# Patient Record
Sex: Male | Born: 1955 | Race: White | Hispanic: No | Marital: Married | Smoking: Former smoker
Health system: Southern US, Community
[De-identification: ages and names within clinical notes are randomized; demographics above are authoritative.]

## PROBLEM LIST (undated history)

## (undated) DIAGNOSIS — K4090 Unilateral inguinal hernia, without obstruction or gangrene, not specified as recurrent: Secondary | ICD-10-CM

## (undated) DIAGNOSIS — K219 Gastro-esophageal reflux disease without esophagitis: Secondary | ICD-10-CM

## (undated) DIAGNOSIS — R351 Nocturia: Secondary | ICD-10-CM

## (undated) DIAGNOSIS — E663 Overweight: Secondary | ICD-10-CM

## (undated) DIAGNOSIS — I10 Essential (primary) hypertension: Secondary | ICD-10-CM

## (undated) DIAGNOSIS — N35919 Unspecified urethral stricture, male, unspecified site: Secondary | ICD-10-CM

## (undated) DIAGNOSIS — N4340 Spermatocele of epididymis, unspecified: Secondary | ICD-10-CM

## (undated) DIAGNOSIS — N411 Chronic prostatitis: Secondary | ICD-10-CM

## (undated) DIAGNOSIS — IMO0001 Reserved for inherently not codable concepts without codable children: Secondary | ICD-10-CM

## (undated) DIAGNOSIS — R42 Dizziness and giddiness: Secondary | ICD-10-CM

## (undated) DIAGNOSIS — J40 Bronchitis, not specified as acute or chronic: Secondary | ICD-10-CM

## (undated) DIAGNOSIS — Z87442 Personal history of urinary calculi: Secondary | ICD-10-CM

## (undated) DIAGNOSIS — N400 Enlarged prostate without lower urinary tract symptoms: Secondary | ICD-10-CM

## (undated) DIAGNOSIS — E119 Type 2 diabetes mellitus without complications: Secondary | ICD-10-CM

## (undated) DIAGNOSIS — Z9889 Other specified postprocedural states: Secondary | ICD-10-CM

## (undated) DIAGNOSIS — R310 Gross hematuria: Secondary | ICD-10-CM

## (undated) DIAGNOSIS — R109 Unspecified abdominal pain: Secondary | ICD-10-CM

## (undated) DIAGNOSIS — G4733 Obstructive sleep apnea (adult) (pediatric): Secondary | ICD-10-CM

## (undated) DIAGNOSIS — N200 Calculus of kidney: Secondary | ICD-10-CM

## (undated) DIAGNOSIS — T8859XA Other complications of anesthesia, initial encounter: Secondary | ICD-10-CM

## (undated) HISTORY — DX: Spermatocele of epididymis, unspecified: N43.40

## (undated) HISTORY — DX: Benign prostatic hyperplasia without lower urinary tract symptoms: N40.0

## (undated) HISTORY — DX: Reserved for inherently not codable concepts without codable children: IMO0001

## (undated) HISTORY — DX: Nocturia: R35.1

## (undated) HISTORY — DX: Overweight: E66.3

## (undated) HISTORY — DX: Bronchitis, not specified as acute or chronic: J40

## (undated) HISTORY — DX: Chronic prostatitis: N41.1

## (undated) HISTORY — DX: Essential (primary) hypertension: I10

## (undated) HISTORY — DX: Calculus of kidney: N20.0

## (undated) HISTORY — DX: Unspecified abdominal pain: R10.9

## (undated) HISTORY — PX: KIDNEY STONE SURGERY: SHX686

## (undated) HISTORY — DX: Unilateral inguinal hernia, without obstruction or gangrene, not specified as recurrent: K40.90

## (undated) HISTORY — PX: KNEE ARTHROSCOPY: SHX127

## (undated) HISTORY — DX: Type 2 diabetes mellitus without complications: E11.9

## (undated) HISTORY — DX: Unspecified urethral stricture, male, unspecified site: N35.919

## (undated) HISTORY — PX: JOINT REPLACEMENT: SHX530

## (undated) HISTORY — DX: Gross hematuria: R31.0

## (undated) HISTORY — PX: OTHER SURGICAL HISTORY: SHX169

---

## 2005-06-09 ENCOUNTER — Encounter: Payer: Self-pay | Admitting: *Deleted

## 2005-06-26 ENCOUNTER — Encounter: Payer: Self-pay | Admitting: *Deleted

## 2005-07-26 ENCOUNTER — Encounter: Payer: Self-pay | Admitting: *Deleted

## 2005-12-23 ENCOUNTER — Emergency Department: Payer: Self-pay | Admitting: Emergency Medicine

## 2006-07-06 ENCOUNTER — Ambulatory Visit: Payer: Self-pay | Admitting: Urology

## 2006-07-08 ENCOUNTER — Ambulatory Visit: Payer: Self-pay | Admitting: Urology

## 2006-07-15 ENCOUNTER — Ambulatory Visit: Payer: Self-pay | Admitting: Urology

## 2007-01-27 ENCOUNTER — Ambulatory Visit: Payer: Self-pay | Admitting: Urology

## 2007-05-20 ENCOUNTER — Ambulatory Visit: Payer: Self-pay | Admitting: Urology

## 2008-04-24 ENCOUNTER — Ambulatory Visit: Payer: Self-pay | Admitting: Urology

## 2008-10-01 ENCOUNTER — Ambulatory Visit: Payer: Self-pay | Admitting: Family Medicine

## 2008-12-03 ENCOUNTER — Ambulatory Visit: Payer: Self-pay | Admitting: Internal Medicine

## 2009-08-22 ENCOUNTER — Ambulatory Visit: Payer: Self-pay | Admitting: Urology

## 2010-08-22 ENCOUNTER — Ambulatory Visit: Payer: Self-pay | Admitting: Urology

## 2010-08-27 ENCOUNTER — Ambulatory Visit: Payer: Self-pay | Admitting: Urology

## 2011-03-18 ENCOUNTER — Ambulatory Visit: Payer: Self-pay | Admitting: Family Medicine

## 2011-05-23 ENCOUNTER — Ambulatory Visit: Payer: Self-pay | Admitting: Internal Medicine

## 2011-05-28 ENCOUNTER — Ambulatory Visit: Payer: Self-pay | Admitting: Family Medicine

## 2011-08-24 ENCOUNTER — Ambulatory Visit: Payer: Self-pay | Admitting: Urology

## 2011-12-22 ENCOUNTER — Ambulatory Visit: Payer: Self-pay | Admitting: Urology

## 2012-01-05 ENCOUNTER — Ambulatory Visit: Payer: Self-pay | Admitting: Urology

## 2012-10-26 HISTORY — PX: HERNIA REPAIR: SHX51

## 2012-12-15 ENCOUNTER — Ambulatory Visit: Payer: Self-pay

## 2013-01-17 ENCOUNTER — Ambulatory Visit: Payer: Self-pay | Admitting: Surgery

## 2013-01-17 LAB — CBC WITH DIFFERENTIAL/PLATELET
Basophil #: 0.1 10*3/uL (ref 0.0–0.1)
Basophil %: 0.8 %
Eosinophil #: 0.1 10*3/uL (ref 0.0–0.7)
Eosinophil %: 1.7 %
HGB: 16.2 g/dL (ref 13.0–18.0)
MCHC: 34 g/dL (ref 32.0–36.0)
Monocyte #: 0.6 x10 3/mm (ref 0.2–1.0)
Neutrophil #: 3.7 10*3/uL (ref 1.4–6.5)
RBC: 5.3 10*6/uL (ref 4.40–5.90)
WBC: 6.9 10*3/uL (ref 3.8–10.6)

## 2013-01-17 LAB — URINALYSIS, COMPLETE
Blood: NEGATIVE
Ketone: NEGATIVE
Nitrite: NEGATIVE
Ph: 6 (ref 4.5–8.0)
Protein: NEGATIVE
Specific Gravity: 1.007 (ref 1.003–1.030)

## 2013-01-17 LAB — BASIC METABOLIC PANEL
Anion Gap: 5 — ABNORMAL LOW (ref 7–16)
Creatinine: 0.72 mg/dL (ref 0.60–1.30)
EGFR (African American): >60
Glucose: 126 mg/dL — ABNORMAL HIGH (ref 65–99)
Osmolality: 272 (ref 275–301)
Potassium: 4 mmol/L (ref 3.5–5.1)

## 2013-01-24 ENCOUNTER — Ambulatory Visit: Payer: Self-pay | Admitting: Surgery

## 2013-01-26 LAB — PATHOLOGY REPORT

## 2013-09-27 ENCOUNTER — Ambulatory Visit: Payer: Self-pay | Admitting: Urology

## 2013-09-28 ENCOUNTER — Ambulatory Visit: Payer: Self-pay | Admitting: Urology

## 2014-05-01 ENCOUNTER — Ambulatory Visit: Payer: Self-pay | Admitting: Emergency Medicine

## 2014-10-12 IMAGING — CT CT ABDOMEN AND PELVIS WITHOUT AND WITH CONTRAST
2 of 4 series · 14 of 32 positions shown, 19 images · non-contrast
Comparison: none

REASON FOR EXAM: microhematuria
COMMENTS:

PROCEDURE:     BAYSAL - BAYSAL ABDOMEN / PELVIS W/WO  - December 15, 2012  [DATE]
RESULT:
TECHNIQUE: Helical 3 mm sections were obtained from the lung bases through
the pubic symphysis pre, immediate and delayed intravenous administration of
100 ml of Nsovue-GL1.

[Series 4: soft tissue with · axial · 0.92mm/px · z∈[-459,-87]mm · 8 of 160 slices shown, 13 images]
[im 18/160  soft-tissue]
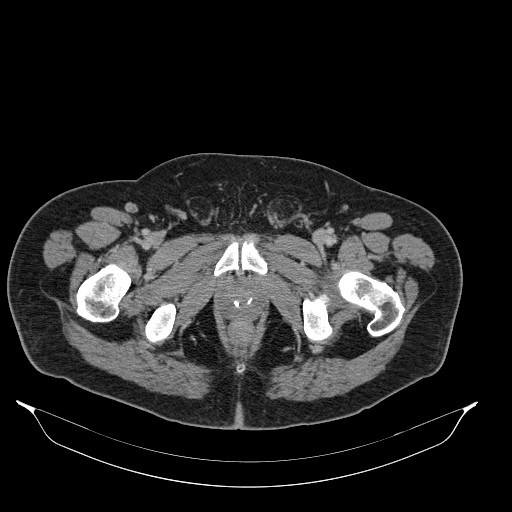
[im 18/160  bone]
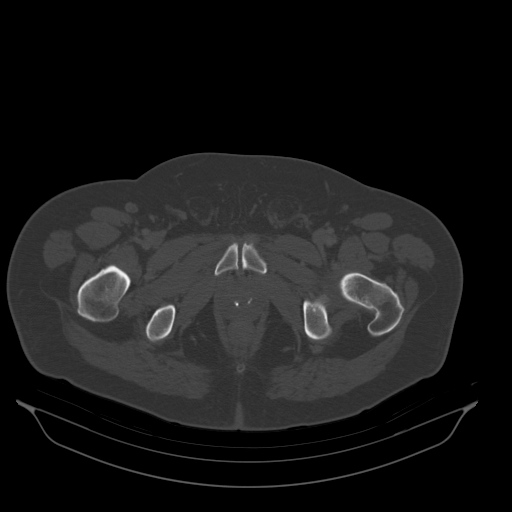
[im 36/160  soft-tissue]
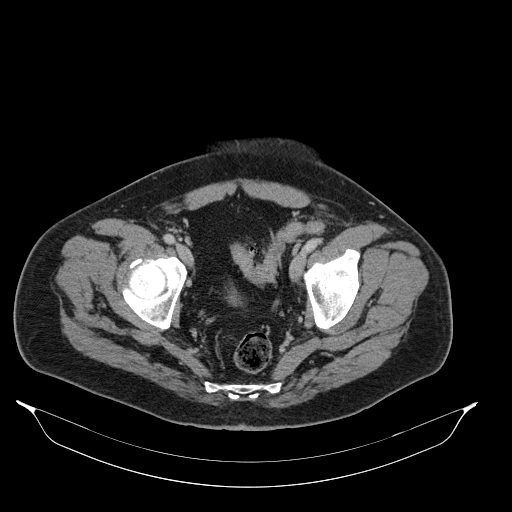
[im 54/160  soft-tissue]
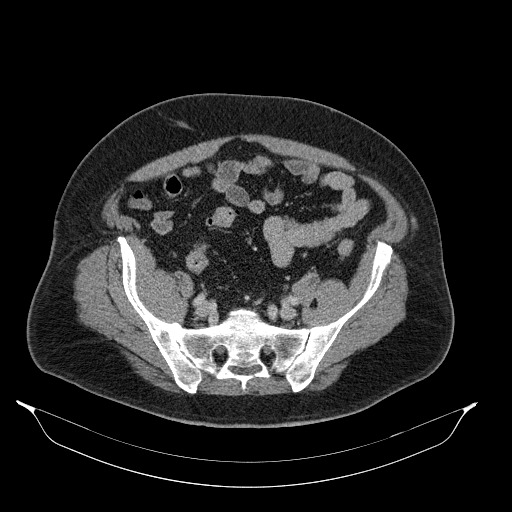
[im 71/160  soft-tissue]
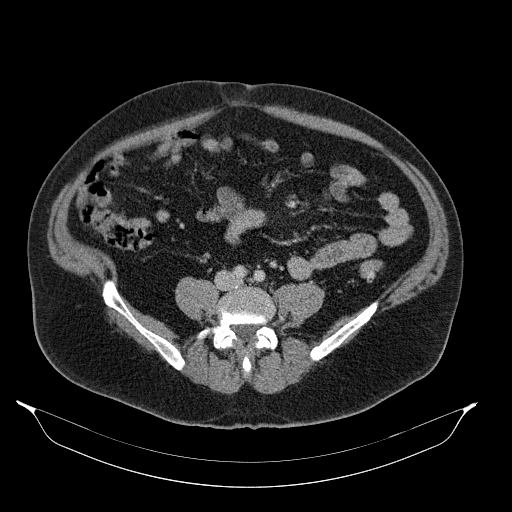
[im 89/160  soft-tissue]
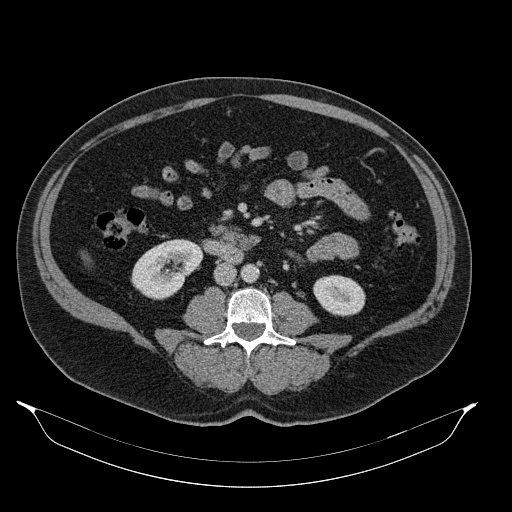
[im 89/160  lung]
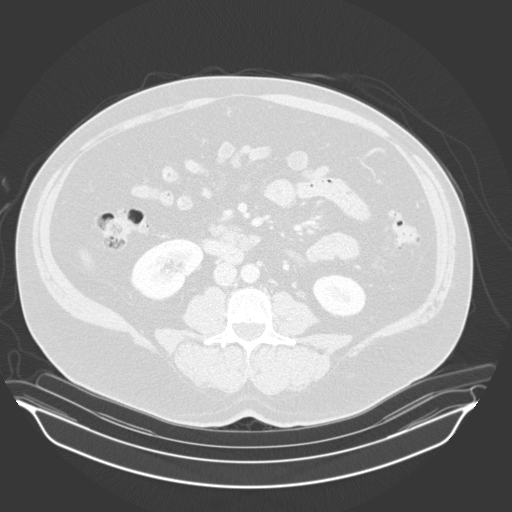
[im 107/160  soft-tissue]
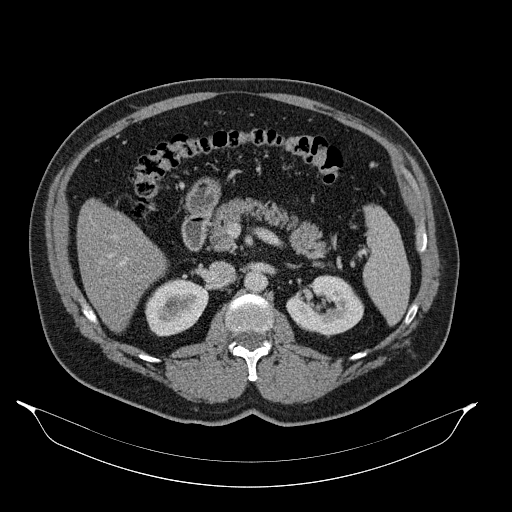
[im 107/160  lung]
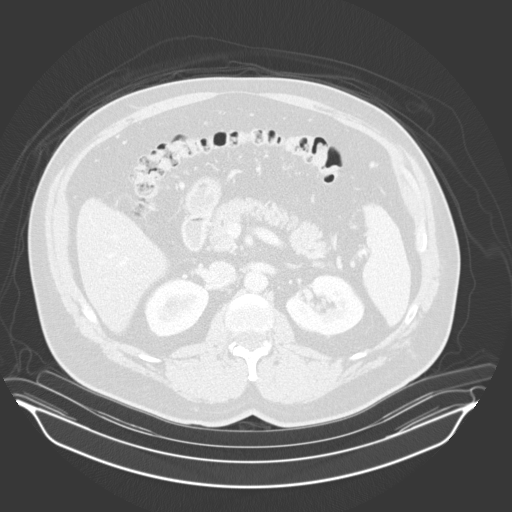
[im 124/160  soft-tissue]
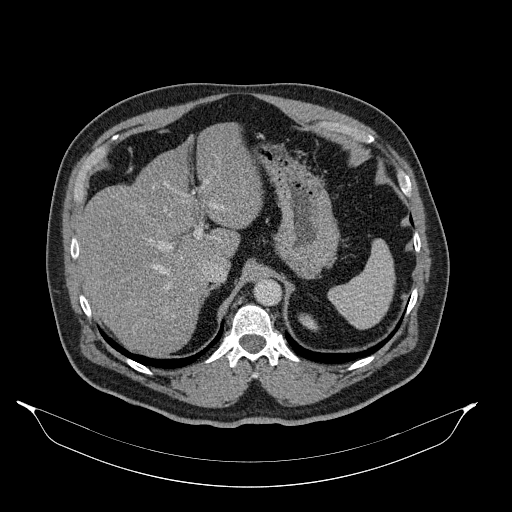
[im 124/160  lung]
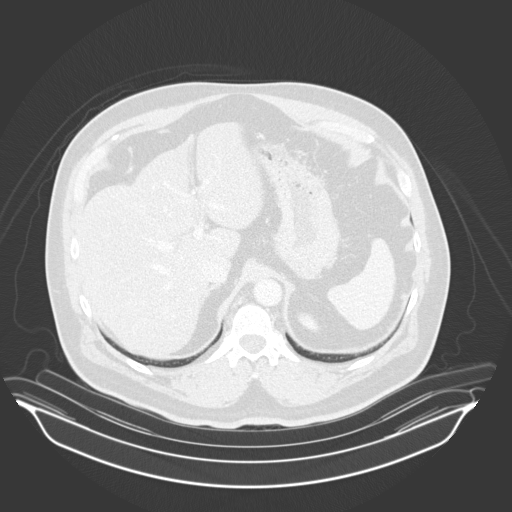
[im 142/160  soft-tissue]
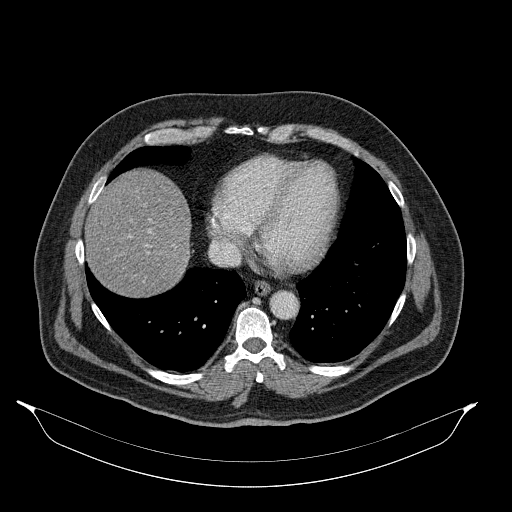
[im 142/160  lung]
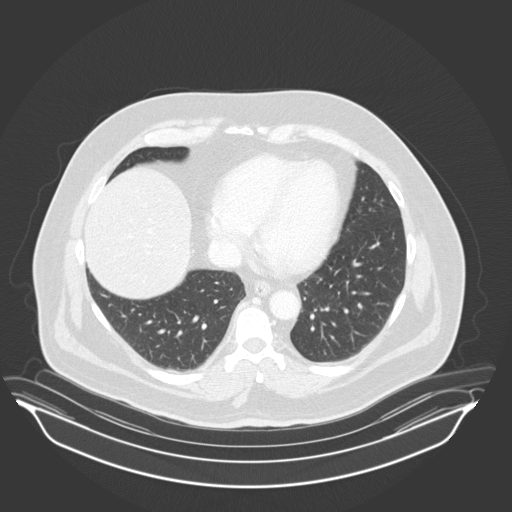

[Series 6: soft tissue delay · axial · delayed · 0.92mm/px · z∈[-459,-192]mm · 6 of 160 slices shown]
[im 18/160  soft-tissue]
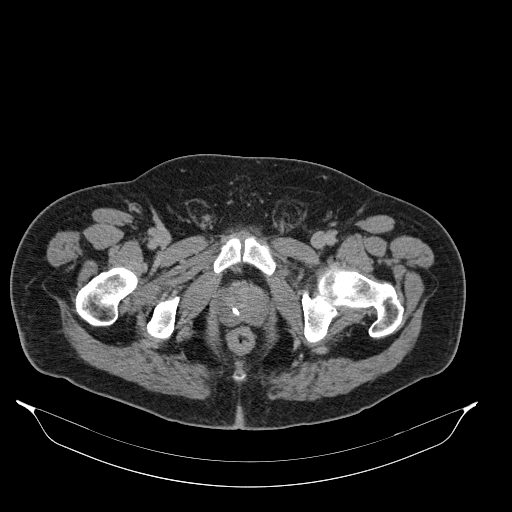
[im 36/160  soft-tissue]
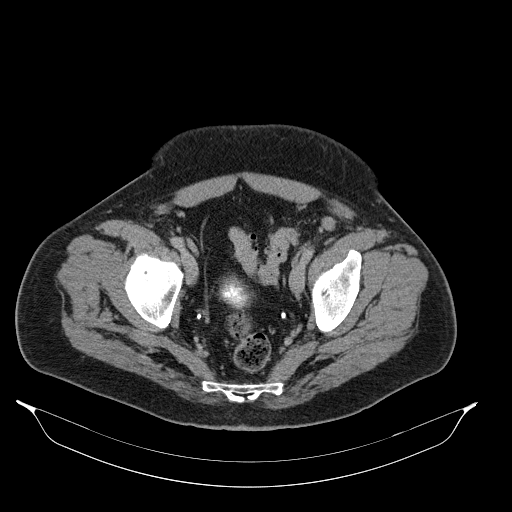
[im 54/160  soft-tissue]
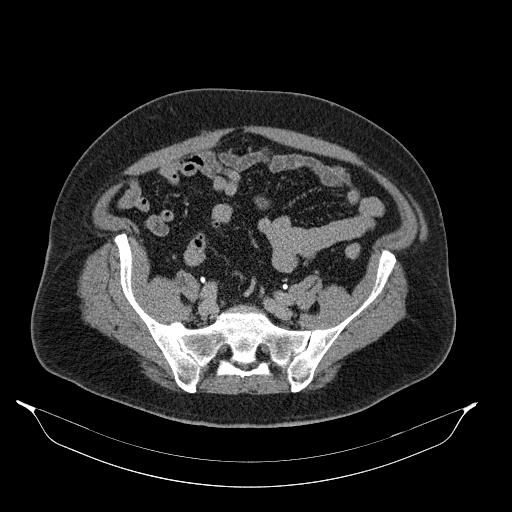
[im 71/160  soft-tissue]
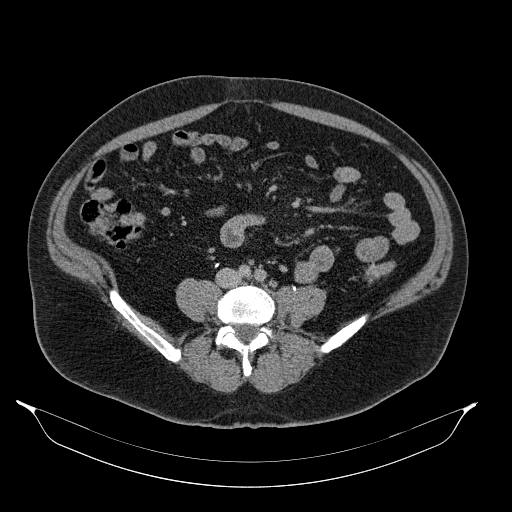
[im 89/160  soft-tissue]
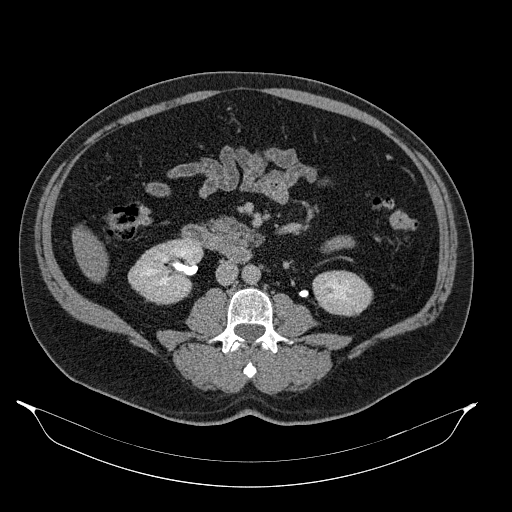
[im 107/160  soft-tissue]
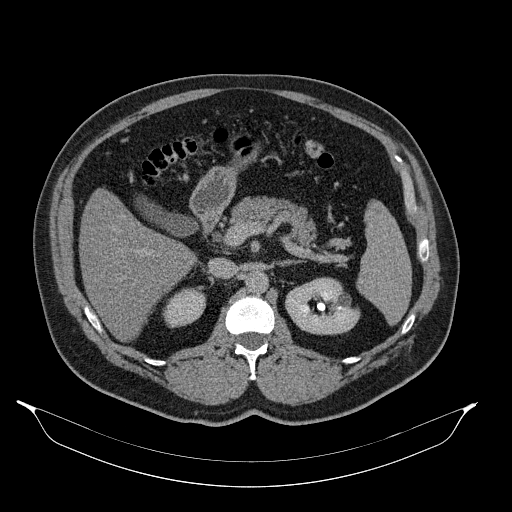

[14 of 32 positions shown; findings below may reference images not displayed]

FINDINGS: The lung bases are unremarkable.

The liver demonstrates a diffuse, low attenuating architecture. No focal
abnormalities are identified. The spleen, adrenals, pancreas, and right
kidney are unremarkable. Evaluation of the left kidney demonstrates focal
wedge-shaped indentation with near complete cortical loss within the lateral
aspect of the mid pole of the kidney. A focal cyst is identified in this
area demonstrating Hounsfield units of 1 and 7 and measures approximately 1
cm. The left kidney is otherwise unremarkable. There is no evidence of
hydronephrosis, hydroureter, nephrolithiasis or ureterolithiasis. There is
no evidence of bowel obstruction, enteritis, colitis, diverticulitis, or
appendicitis. The appendix is appreciated and is unremarkable. There is no
evidence of abdominal aortic aneurysm. The celiac, SMA, IMA, portal vein,
and SMV are opacified. Incidental note is made of a left, inguinal hernia
which has developed in the interim and contains a portion of large bowel.
IMPRESSION: 1.  Left, inguinal hernia as described above.
2.  Cortical scarring involving the left kidney and differential
considerations are vascular insult versus possible focal infectious insult.
A cyst is also identified within this region and possibly secondary to
scarring.

## 2014-12-07 ENCOUNTER — Ambulatory Visit: Payer: Self-pay | Admitting: Urology

## 2014-12-18 ENCOUNTER — Ambulatory Visit: Payer: Self-pay

## 2015-02-15 NOTE — Op Note (Signed)
PATIENT NAME:  Shawn, Atkins MR#:  [id number] DATE OF BIRTH:  Mar 12, 1956  DATE OF PROCEDURE:  01/24/2013  PREOPERATIVE DIAGNOSES:  1. Bilateral inguinal hernias, left side symptomatic, right side incidental.  2. Umbilical hernia.   PROCEDURE PERFORMED:  1. Open umbilical hernia.  2. preperitoneal laparoscopic bilateral inguinal herniorrhaphies with mesh.   SURGEON: Dr. Hortencia Atkins  ASSISTANT: Dr. Burt Atkins  TYPE OF ANESTHESIA: General endotracheal and local.   FINDINGS: Small direct inguinal hernia on the right, large indirect inguinal hernia on the left, small umbilical hernia with incarcerated preperitoneal fat.   SPECIMENS: Preperitoneal fat from hernia on the umbilicus to Pathology.   ESTIMATED BLOOD LOSS: Minimal.   DRAINS: Foley catheter.   DESCRIPTION OF PROCEDURE: With informed consent, supine position, general anesthesia was induced. A Foley catheter was placed under my supervision, a 16-French coude without difficulty. Clear urine drained.  The patient's abdomen was widely prepped and draped with ChloraPrep solution, both arms being tucked at his side. Timeout was observed.   An infraumbilical transversely oriented skin incision was fashioned with scalpel and electrocautery through the subcutaneous tissues to the right side of the anterior rectus fascia. Rectus fascia was incised in an oblique orientation with a scalpel. The preperitoneal space was entered. Muscle was swept laterally. The kidney-shaped dissecting balloon was then inserted gently down to the pubic symphysis. It was then insufflated under direct visualization. The balloon was then released. The operating trocar balloon was then placed. Preperitoneal space was then insufflated. Two 5 mm operating trocars were then placed in the vertical midline. Attention was first turned on the right side. Cooper's ligament was identified. The space lateral to the cord structures were identified with identification of the nerve.    Incarcerated preperitoneal fat was then removed from the direct inguinal hernia on the right side. Window was fashioned beneath the spermatic cord on the right side. Cord lipoma was reduced back into the preperitoneal space. The peritoneal reflection was identified and reduced back out of the preperitoneal space. The left side was then dissected. A large indirect inguinal hernia was identified. A piece of scissored 4 x 6 inch Atrium ProLite mesh was then inserted into the preperitoneal space, tacked to Cooper's ligament, the two tails being encircled around the spermatic cord, two tails being tacked with the ProTacker laterally and anteriorly on the abdominal wall. Tacks were then placed on the anterior abdominal wall on either side of the epigastric vessels. New internal ring was then created with the mesh by tacking the anterior and posterior leaflets together. A similar-sized piece of mesh was placed in identical fashion on the right side. Ports were then removed under direct vision. The umbilical hernia defect was then closed with interrupted simple Ethibond sutures. The anterior fascia which reapproximated with 0 Vicryl suture in vertical orientation. Subcutaneous tissues were irrigated with saline. Hemostasis was obtained with point cautery. The umbilical stalk was then reattached to the fascia with a figure-of-eight #2-0 Vicryl suture. Deep space was obliterated with 2-0 Vicryl. 4-0 Vicryl subcuticular was applied to all skin edges followed by benzoin, Steri-Strips and occlusive sterile dressing. Due to difficulty in placing Foley catheter in the past, Foley catheter was remained in place and will be addressed as an outpatient in the urological office.    ____________________________ Shawn Atkins. Shawn Gravel, Atkins mab:es D: 01/25/2013 09:07:58 ET T: 01/25/2013 09:17:25 ET JOB#: [id number]  cc: Shawn Atkins, <Dictator> Shawn Conradi Atkins ELECTRONICALLY SIGNED 01/28/2013 19:43

## 2015-06-14 ENCOUNTER — Encounter: Payer: Self-pay | Admitting: Emergency Medicine

## 2015-06-14 ENCOUNTER — Ambulatory Visit
Admission: EM | Admit: 2015-06-14 | Discharge: 2015-06-14 | Disposition: A | Discharge status: Home / Self Care | Payer: Worker's Compensation | Attending: Family Medicine | Admitting: Family Medicine

## 2015-06-14 ENCOUNTER — Ambulatory Visit: Payer: Worker's Compensation

## 2015-06-14 DIAGNOSIS — S46912A Strain of unspecified muscle, fascia and tendon at shoulder and upper arm level, left arm, initial encounter: Secondary | ICD-10-CM

## 2015-06-14 DIAGNOSIS — X58XXXA Exposure to other specified factors, initial encounter: Secondary | ICD-10-CM | POA: Diagnosis not present

## 2015-06-14 DIAGNOSIS — M25512 Pain in left shoulder: Secondary | ICD-10-CM | POA: Diagnosis present

## 2015-06-14 MED ORDER — IBUPROFEN 800 MG PO TABS: 800.0000 mg | ORAL_TABLET | Freq: Three times a day (TID) | ORAL | Status: DC

## 2015-06-14 NOTE — ED Provider Notes (Signed)
CSN: [id number]     Arrival date & time 06/14/15  2703 History   First MD Initiated Contact with Patient 06/14/15 [phone]     Chief Complaint  Patient presents with  . Shoulder Injury   (Consider location/radiation/quality/duration/timing/severity/associated sxs/prior Treatment) HPI Comments: 59 yo male presents with a c/o left shoulder pain after injuring it at work this morning. States he was lifting his left arm forward and above the head level when he felt a sudden "pop" and pain to the shoulder. Patient was not holding anything in his hand at the time. States he grabbed the piece he was reaching for, pulled it down, worked with it and put it back up. Pain continued throughout the whole process. States pain is worse with movement of his shoulder "out or in".   The history is provided by the patient.    History reviewed. No pertinent past medical history. Past Surgical History  Procedure Laterality Date  . Hernia repair    . Joint replacement    . Kidney stone surgery     History reviewed. No pertinent family history. Social History  Substance Use Topics  . Smoking status: Former Research scientist (life sciences)  . Smokeless tobacco: None  . Alcohol Use: 0.6 oz/week    1 Cans of beer per week    Review of Systems  Allergies  Penicillins and Morphine and related  Home Medications   Prior to Admission medications   Medication Sig Start Date End Date Taking? Authorizing Provider  ibuprofen (ADVIL,MOTRIN) 800 MG tablet Take 1 tablet (800 mg total) by mouth 3 (three) times daily. 06/14/15   Norval Gable, MD   BP 148/87 mmHg  Pulse 72  Temp(Src) 98.3 F (36.8 C) (Tympanic)  Resp 18  Ht 5\' 9"  (1.753 m)  Wt 220 lb (99.791 kg)  BMI 32.47 kg/m2  SpO2 99% Physical Exam  Constitutional: He appears well-developed and well-nourished. No distress.  Musculoskeletal:       Left shoulder: He exhibits decreased range of motion, tenderness and pain. He exhibits no swelling, no effusion, no crepitus, no  deformity, no laceration, no spasm, normal pulse and normal strength.  Skin: He is not diaphoretic.  Nursing note and vitals reviewed.   ED Course  Procedures (including critical care time) Labs Review Labs Reviewed - No data to display  Imaging Review Dg Shoulder Left  06/14/2015   CLINICAL DATA:  Pain and decreased range of motion since a reaching injury this morning.  EXAM: LEFT SHOULDER - 2+ VIEW  COMPARISON:  None.  FINDINGS: There is no evidence of fracture or dislocation. There is no evidence of arthropathy or other focal bone abnormality. Soft tissues are unremarkable.  IMPRESSION: Normal exam.   Electronically Signed   By: Lorriane Shire M.D.   On: 06/14/2015 10:36     MDM   1. Shoulder strain, left, initial encounter    Discharge Medication List as of 06/14/2015 10:59 AM    START taking these medications   Details  ibuprofen (ADVIL,MOTRIN) 800 MG tablet Take 1 tablet (800 mg total) by mouth 3 (three) times daily., Starting 06/14/2015, Until Discontinued, Normal      Plan: 1. Test/x-ray results and diagnosis reviewed with patient 2. rx as per orders; risks, benefits, potential side effects reviewed with patient 3. Recommend supportive treatment with rest today, ice, start gentle range of motion tomorrow; work restrictions (no overhead use of left arm, shoulder for one week) 4. F/u in 1 week or sooner prn if symptoms worsen or  don't improve    Norval Gable, MD 06/14/15 1124

## 2015-06-14 NOTE — ED Notes (Signed)
Pt with pain left shoulder after pulling on a machine x 3 hours

## 2015-06-21 ENCOUNTER — Encounter: Payer: Self-pay | Admitting: Emergency Medicine

## 2015-06-21 ENCOUNTER — Ambulatory Visit
Admission: EM | Admit: 2015-06-21 | Discharge: 2015-06-21 | Disposition: A | Discharge status: Home / Self Care | Payer: Worker's Compensation | Attending: Family Medicine | Admitting: Family Medicine

## 2015-06-21 DIAGNOSIS — S46912D Strain of unspecified muscle, fascia and tendon at shoulder and upper arm level, left arm, subsequent encounter: Secondary | ICD-10-CM

## 2015-06-21 NOTE — ED Provider Notes (Signed)
CSN: [id number]     Arrival date & time 06/21/15  1036 History   First MD Initiated Contact with Patient 06/21/15 1106     Chief Complaint  Patient presents with  . Worker's Comp Follow-up Visit    (Consider location/radiation/quality/duration/timing/severity/associated sxs/prior Treatment) HPI Comments: 59 yo male with a work related left shoulder injury one week ago. Seen 1 week ago, x-ray negative and diagnosed with a shoulder strain. Here for follow up and states left shoulder symptoms not improved. Difficulty and pain with lifting left arm above shoulder level. Denies any numbness, tingling or swelling of arm.   The history is provided by the patient.    History reviewed. No pertinent past medical history. Past Surgical History  Procedure Laterality Date  . Hernia repair    . Joint replacement    . Kidney stone surgery     History reviewed. No pertinent family history. Social History  Substance Use Topics  . Smoking status: Former Research scientist (life sciences)  . Smokeless tobacco: None  . Alcohol Use: 0.6 oz/week    1 Cans of beer per week    Review of Systems  Allergies  Penicillins and Morphine and related  Home Medications   Prior to Admission medications   Medication Sig Start Date End Date Taking? Authorizing Provider  ibuprofen (ADVIL,MOTRIN) 800 MG tablet Take 1 tablet (800 mg total) by mouth 3 (three) times daily. 06/14/15   Norval Gable, MD   Meds Ordered and Administered this Visit  Medications - No data to display  BP 117/78 mmHg  Pulse 90  Temp(Src) 98.4 F (36.9 C) (Oral)  Resp 16  Ht 5\' 9"  (1.753 m)  Wt 220 lb (99.791 kg)  BMI 32.47 kg/m2  SpO2 96% No data found.   Physical Exam  Constitutional: He appears well-developed and well-nourished. No distress.  Musculoskeletal:       Left shoulder: He exhibits decreased range of motion, tenderness, crepitus, pain, spasm and decreased strength. He exhibits no swelling, no effusion, no deformity, no laceration and normal  pulse.  Left shoulder with limited abduction and rotation; positive supraspinatus isolation test; diffuse tenderness over the shoulder  Skin: He is not diaphoretic.    ED Course  Procedures (including critical care time)  Labs Review Labs Reviewed - No data to display  Imaging Review No results found.   Visual Acuity Review  Right Eye Distance:   Left Eye Distance:   Bilateral Distance:    Right Eye Near:   Left Eye Near:    Bilateral Near:         MDM   1. Left shoulder strain, subsequent encounter   (possible rotator cuff injury)  Plan: 1.  diagnosis reviewed with patient 2. Recommend work restriction of light duty, no use of left shoulder until evaluated by specialist 3. Recommend referral to orthopedic specialist for further evaluation and management  4. Continue anti-inflammatory/pain medication    Norval Gable, MD 06/21/15 [phone]

## 2015-06-21 NOTE — ED Notes (Signed)
Patient here for follow-up for a work related injury.  Patient reports still having pain in his left shoulder.

## 2015-06-25 DIAGNOSIS — M7520 Bicipital tendinitis, unspecified shoulder: Secondary | ICD-10-CM | POA: Insufficient documentation

## 2015-08-06 ENCOUNTER — Ambulatory Visit: Payer: Self-pay | Admitting: Obstetrics and Gynecology

## 2015-08-08 ENCOUNTER — Encounter: Payer: Self-pay | Admitting: *Deleted

## 2015-08-20 ENCOUNTER — Other Ambulatory Visit: Payer: Self-pay | Admitting: Urology

## 2015-08-20 ENCOUNTER — Ambulatory Visit (INDEPENDENT_AMBULATORY_CARE_PROVIDER_SITE_OTHER): Payer: BLUE CROSS/BLUE SHIELD | Admitting: Urology

## 2015-08-20 ENCOUNTER — Ambulatory Visit
Admission: RE | Admit: 2015-08-20 | Discharge: 2015-08-20 | Disposition: A | Discharge status: Home / Self Care | Payer: BLUE CROSS/BLUE SHIELD | Source: Ambulatory Visit | Attending: Urology | Admitting: Urology

## 2015-08-20 ENCOUNTER — Encounter: Payer: Self-pay | Admitting: Urology

## 2015-08-20 VITALS — BP 126/84 | HR 73 | Ht 69.0 in | Wt 217.1 lb

## 2015-08-20 DIAGNOSIS — N138 Other obstructive and reflux uropathy: Secondary | ICD-10-CM

## 2015-08-20 DIAGNOSIS — N401 Enlarged prostate with lower urinary tract symptoms: Secondary | ICD-10-CM

## 2015-08-20 DIAGNOSIS — N200 Calculus of kidney: Secondary | ICD-10-CM | POA: Diagnosis not present

## 2015-08-20 DIAGNOSIS — R109 Unspecified abdominal pain: Secondary | ICD-10-CM

## 2015-08-20 LAB — URINALYSIS, COMPLETE
BILIRUBIN UA: NEGATIVE
GLUCOSE, UA: NEGATIVE
Ketones, UA: NEGATIVE
Leukocytes, UA: NEGATIVE
Nitrite, UA: NEGATIVE
PH UA: 6.5 (ref 5.0–7.5)
PROTEIN UA: NEGATIVE
RBC, UA: NEGATIVE
Specific Gravity, UA: <1.005 — ABNORMAL LOW (ref 1.005–1.030)
UUROB: 0.2 mg/dL (ref 0.2–1.0)

## 2015-08-20 LAB — MICROSCOPIC EXAMINATION
Bacteria, UA: NONE SEEN
EPITHELIAL CELLS (NON RENAL): NONE SEEN /HPF (ref 0–10)
RBC MICROSCOPIC, UA: NONE SEEN /HPF (ref 0–?)
Renal Epithel, UA: NONE SEEN /hpf
WBC, UA: NONE SEEN /hpf (ref 0–?)

## 2015-08-20 NOTE — Progress Notes (Signed)
08/20/2015 9:58 AM   Shawn Atkins 1955-11-30 [id number]  Referring provider: Lynnell Jude, MD [address] [place], [place] [address]  Chief Complaint  Patient presents with  . Abdominal Pain    HPI: Patient is a 59 year old white male who presents today complaining of intermittent left-sided flank pain.   He states it occurring for the las 3 weeks. He describes he pain as a pressure like feeling. He states sometimes the pain last all day, but mostly it lasts  2 hours at the time. Walking around and being on his feet for long periods of time makes the pain worse.  Siting make the pain lessened.  Patient has a long-standing history of having this intermittent left-sided flank pain.  2 years ago he underwent a renal stone protocol CT and was noted to have a small right 2 mm stone.  No findings were discovered no results of his left-sided flank pain. It was suggested that he undergo colonoscopy and he was scheduled for the exam, but he did not undergo the colonoscopy.  Earlier this year he underwent a renal ultrasound which noted a 2 cm left upper pole renal cyst, a scrotal ultrasound which noted bilateral varicoceles and a left spermatocele, CT scan of the abdomen and pelvis with contrast which noted a 1 mm non obstructing left renal stone and the 2 mm non obstructing right renal stone.  KUB taken today noted a 1-2 mm stone in the right kidney.  He is having frequent urination and nocturia, but these are long-standing urinary symptoms for which he takes Cialis 5 mg daily.  He denies any dysuria, suprapubic pain or gross hematuria.  He also denies any fevers, chills, nausea or vomiting.  His UA today is unremarkable.   PMH: Past Medical History  Diagnosis Date  . Nocturia   . Frequency   . BPH (benign prostatic hypertrophy)   . Flank pain   . Spermatocele   . Overweight   . HTN (hypertension)   . Gross hematuria   . Inguinal hernia   . Stricture of urethra   . Chronic prostatitis     . Kidney stones     Surgical History: Past Surgical History  Procedure Laterality Date  . Hernia repair Left   . Joint replacement      knee  . Kidney stone surgery    . Open lithotomy      Home Medications:    Medication List       This list is accurate as of: 08/20/15 11:59 PM.  Always use your most recent med list.               ibuprofen 800 MG tablet  Commonly known as:  ADVIL,MOTRIN  Take 1 tablet (800 mg total) by mouth 3 (three) times daily.     meloxicam 15 MG tablet  Commonly known as:  MOBIC     methocarbamol 500 MG tablet  Commonly known as:  ROBAXIN     multivitamin tablet  Take 1 tablet by mouth daily.     tadalafil 5 MG tablet  Commonly known as:  CIALIS  Take 1 tablet (5 mg total) by mouth daily as needed for erectile dysfunction.     traMADol 50 MG tablet  Commonly known as:  ULTRAM        Allergies:  Allergies  Allergen Reactions  . Penicillins Anaphylaxis  . Morphine And Related Nausea And Vomiting    Family History: Family History  Problem  Relation Age of Onset  . Kidney disease Neg Hx   . Prostate cancer Neg Hx   . Benign prostatic hyperplasia Father     Social History:  reports that he has quit smoking. He does not have any smokeless tobacco history on file. He reports that he drinks about 0.6 oz of alcohol per week. His drug history is not on file.  ROS: UROLOGY Frequent Urination?: Yes Hard to postpone urination?: No Burning/pain with urination?: No Get up at night to urinate?: Yes Leakage of urine?: No Urine stream starts and stops?: No Trouble starting stream?: No Do you have to strain to urinate?: No Blood in urine?: No Urinary tract infection?: No Sexually transmitted disease?: No Injury to kidneys or bladder?: No Painful intercourse?: No Weak stream?: No Erection problems?: No Penile pain?: No  Gastrointestinal Nausea?: No Vomiting?: No Indigestion/heartburn?: No Diarrhea?: No Constipation?:  No  Constitutional Fever: No Night sweats?: No Weight loss?: No Fatigue?: No  Skin Skin rash/lesions?: No Itching?: No  Eyes Blurred vision?: No Double vision?: No  Ears/Nose/Throat Sore throat?: No Sinus problems?: Yes  Hematologic/Lymphatic Swollen glands?: No Easy bruising?: No  Cardiovascular Leg swelling?: No Chest pain?: No  Respiratory Cough?: No Shortness of breath?: No  Endocrine Excessive thirst?: No  Musculoskeletal Back pain?: No Joint pain?: No  Neurological Headaches?: No Dizziness?: No  Psychologic Depression?: No Anxiety?: No  Physical Exam: BP 126/84 mmHg  Pulse 73  Ht 5\' 9"  (1.753 m)  Wt 217 lb 1.6 oz (98.476 kg)  BMI 32.05 kg/m2  Constitutional: Well nourished. Alert and oriented, No acute distress. HEENT: [place] AT, moist mucus membranes. Trachea midline, no masses. Cardiovascular: No clubbing, cyanosis, or edema. Respiratory: Normal respiratory effort, no increased work of breathing. GI: Abdomen is soft, non tender, non distended, no abdominal masses. Liver and spleen not palpable.  No hernias appreciated.  Stool sample for occult testing is not indicated.   GU: No CVA tenderness.  No bladder fullness or masses.  Patient with circumcised phallus.   Urethral meatus is patent.  No penile discharge. No penile lesions or rashes. Scrotum without lesions, cysts, rashes and/or edema.  Testicles are located scrotally bilaterally. No masses are appreciated in the testicles. Left and right epididymis are normal. Rectal: Patient with  normal sphincter tone. Anus and perineum without scarring or rashes. No rectal masses are appreciated. Prostate is approximately 55 grams, no nodules are appreciated. Seminal vesicles are normal. Skin: No rashes, bruises or suspicious lesions. Lymph: No cervical or inguinal adenopathy. Neurologic: Grossly intact, no focal deficits, moving all 4 extremities. Psychiatric: Normal mood and affect.   Laboratory  Data: Lab Results  Component Value Date   WBC 6.9 01/17/2013   HGB 16.2 01/17/2013   HCT 47.7 01/17/2013   MCV 90 01/17/2013   PLT 264 01/17/2013    Lab Results  Component Value Date   CREATININE 0.72 01/17/2013    PSA history:  0.6 ng/mL on 12/06/2012  0.5 ng/mL on 08/06/2014  0.4 ng/mL on 12/06/2014  Urinalysis  Results for orders placed or performed in visit on 08/20/15  Microscopic Examination  Result Value Ref Range   WBC, UA None seen 0 -  5 /hpf   RBC, UA None seen 0 -  2 /hpf   Epithelial Cells (non renal) None seen 0 - 10 /hpf   Renal Epithel, UA None seen None seen /hpf   Bacteria, UA None seen None seen/Few  Urinalysis, Complete  Result Value Ref Range   Specific  Gravity, UA <1.005 (L) 1.005 - 1.030   pH, UA 6.5 5.0 - 7.5   Color, UA Yellow Yellow   Appearance Ur Clear Clear   Leukocytes, UA Negative Negative   Protein, UA Negative Negative/Trace   Glucose, UA Negative Negative   Ketones, UA Negative Negative   RBC, UA Negative Negative   Bilirubin, UA Negative Negative   Urobilinogen, Ur 0.2 0.2 - 1.0 mg/dL   Nitrite, UA Negative Negative   Microscopic Examination See below:      Assessment & Plan:    1. Left lateral abdominal pain:   I encouraged the patient to have his colonoscopy rescheduled as no urological findings would explain his left side pain.  He is worried that it may be a kidney stone causing the pain. We did obtain a KUB today and no left nephrolithiasis or ureteral stones were noted.  He then requested another CT or renal ultrasound for further evaluation. In an effort to reduce his exposure to more radiation since he's had 2 CTs prior while having this left lateral pain, I will schedule a renal ultrasound to rule out hydronephrosis.    - Urinalysis, Complete  2. BPH with LUTS:   Patient's BPH symptoms are well controlled with Cialis 5 mg daily. A refill sent to his pharmacy. He is seen annual basis. He'll return in February for a PSA,  exam and I PSS score.  Return in about 4 months (around 12/21/2015) for IPSS score.  Zara Council, [facility] [address] [place], [place] [address] [phone]

## 2015-08-21 ENCOUNTER — Telehealth: Payer: Self-pay

## 2015-08-21 DIAGNOSIS — N200 Calculus of kidney: Secondary | ICD-10-CM

## 2015-08-21 NOTE — Telephone Encounter (Signed)
-----   Message from Nori Riis, PA-C sent at 08/20/2015  4:32 PM EDT ----- Patient does not have any visible stones in the left kidney. There may be a tiny 1-2 mm stone in his right kidney.

## 2015-08-21 NOTE — Telephone Encounter (Signed)
No vm

## 2015-08-22 NOTE — Telephone Encounter (Signed)
Patient may have a RUS, but he needs a colonoscopy!

## 2015-08-22 NOTE — Telephone Encounter (Signed)
Spoke with pt in reference to RUS and colonoscopy. Pt stated colonoscopy is scheduled for 09/03/15. Pt requested refills on cialis. Please advise.

## 2015-08-22 NOTE — Telephone Encounter (Signed)
Spoke with pt in reference to KUB. Pt stated he is having a lot of pain in his left side to the point he left work early yesterday and called out today. Pt requested a RUS or CT. Please advise.  Pt also requested more cialis.

## 2015-08-23 ENCOUNTER — Encounter: Payer: Self-pay | Admitting: Urology

## 2015-08-23 DIAGNOSIS — R1032 Left lower quadrant pain: Secondary | ICD-10-CM | POA: Insufficient documentation

## 2015-08-23 DIAGNOSIS — N138 Other obstructive and reflux uropathy: Secondary | ICD-10-CM | POA: Insufficient documentation

## 2015-08-23 DIAGNOSIS — N401 Enlarged prostate with lower urinary tract symptoms: Secondary | ICD-10-CM

## 2015-08-23 MED ORDER — TADALAFIL 5 MG PO TABS: 5.0000 mg | ORAL_TABLET | Freq: Every day | ORAL | Status: DC | PRN

## 2015-08-26 NOTE — Telephone Encounter (Signed)
No answer

## 2015-08-26 NOTE — Telephone Encounter (Signed)
Patient can have a RUS.

## 2015-08-27 ENCOUNTER — Other Ambulatory Visit: Payer: Self-pay

## 2015-08-27 DIAGNOSIS — N401 Enlarged prostate with lower urinary tract symptoms: Principal | ICD-10-CM

## 2015-08-27 DIAGNOSIS — N138 Other obstructive and reflux uropathy: Secondary | ICD-10-CM

## 2015-08-27 MED ORDER — TADALAFIL 5 MG PO TABS: 5.0000 mg | ORAL_TABLET | Freq: Every day | ORAL | Status: DC | PRN

## 2015-08-27 NOTE — Telephone Encounter (Signed)
Spoke with pt in reference RUS. Pt voiced understanding.

## 2015-09-03 ENCOUNTER — Ambulatory Visit
Admission: RE | Admit: 2015-09-03 | Discharge: 2015-09-03 | Disposition: A | Discharge status: Home / Self Care | Payer: BLUE CROSS/BLUE SHIELD | Source: Ambulatory Visit | Attending: Urology | Admitting: Urology

## 2015-09-03 DIAGNOSIS — N200 Calculus of kidney: Secondary | ICD-10-CM | POA: Diagnosis not present

## 2015-09-10 ENCOUNTER — Encounter: Payer: Self-pay | Admitting: Urology

## 2015-09-10 ENCOUNTER — Ambulatory Visit (INDEPENDENT_AMBULATORY_CARE_PROVIDER_SITE_OTHER): Payer: BLUE CROSS/BLUE SHIELD | Admitting: Urology

## 2015-09-10 VITALS — BP 133/84 | HR 84 | Temp 97.0°F | Ht 69.0 in | Wt 218.1 lb

## 2015-09-10 DIAGNOSIS — R109 Unspecified abdominal pain: Secondary | ICD-10-CM

## 2015-09-10 NOTE — Progress Notes (Signed)
09/10/2015 11:39 AM   [name] Jul 12, 1956 [id number]  Referring provider: Lynnell Jude, MD [address] [place], [place] [id number]  Chief Complaint  Patient presents with  . RUS Results    HPI: Patient is a 59 year old white male who presents today for a RUS report for left flank pain.  Patient has had this left sided colicky pain off and on for the last five years.  He has had CT scans, ultrasounds and X-rays and no etiology was found for his pain.  He had been scheduled for a colonoscopy in the past, but he did not keep the appointment.  He is scheduled for one this Friday.      Scrotal US performed on 12/07/2014 found a 5.8 cm complex left epididymal head cyst/spermatocele. Suspected bilateral varicoceles. Scrotal wall thickening/edema.  CT scan on 12/18/2014 found bilateral nephrolithiasis.  Diffuse hepatic steatosis. Cholelithiasis.  Scattered colonic diverticulosis.  Enlarged prostate gland.  Current PSA is 0.4 ng/mL on 12/06/2014.    His RUS did not note any abnormality.  He is still experiencing intense left side pain.  He has point tenderness at the level of lower lumbar region on the left.  He denies any urinary symptoms or constipation/diarrhea.     PMH: Past Medical History  Diagnosis Date  . Nocturia   . Frequency   . BPH (benign prostatic hypertrophy)   . Flank pain   . Spermatocele   . Overweight   . HTN (hypertension)   . Gross hematuria   . Inguinal hernia   . Stricture of urethra   . Chronic prostatitis   . Kidney stones     Surgical History: Past Surgical History  Procedure Laterality Date  . Hernia repair Left   . Joint replacement      knee  . Kidney stone surgery    . Open lithotomy      Home Medications:    Medication List       This list is accurate as of: 09/10/15 11:59 PM.  Always use your most recent med list.               ibuprofen 800 MG tablet  Commonly known as:  ADVIL,MOTRIN  Take 1 tablet (800 mg total) by mouth 3  (three) times daily.     meloxicam 15 MG tablet  Commonly known as:  MOBIC     methocarbamol 500 MG tablet  Commonly known as:  ROBAXIN     multivitamin tablet  Take 1 tablet by mouth daily.     tadalafil 5 MG tablet  Commonly known as:  CIALIS  Take 1 tablet (5 mg total) by mouth daily as needed for erectile dysfunction.     traMADol 50 MG tablet  Commonly known as:  ULTRAM        Allergies:  Allergies  Allergen Reactions  . Penicillins Anaphylaxis  . Morphine And Related Nausea And Vomiting    Family History: Family History  Problem Relation Age of Onset  . Kidney disease Neg Hx   . Prostate cancer Neg Hx   . Benign prostatic hyperplasia Father     Social History:  reports that he has quit smoking. His smokeless tobacco use includes Chew. He reports that he drinks about 0.6 oz of alcohol per week. His drug history is not on file.  ROS: UROLOGY Frequent Urination?: No Hard to postpone urination?: No Burning/pain with urination?: No Get up at night to urinate?: No Leakage of  urine?: No Urine stream starts and stops?: No Trouble starting stream?: No Do you have to strain to urinate?: No Blood in urine?: No Urinary tract infection?: No Sexually transmitted disease?: No Injury to kidneys or bladder?: No Painful intercourse?: No Weak stream?: No Erection problems?: No Penile pain?: No  Gastrointestinal Nausea?: No Vomiting?: No Indigestion/heartburn?: No Diarrhea?: No Constipation?: No  Constitutional Fever: No Night sweats?: No Weight loss?: No Fatigue?: No  Skin Skin rash/lesions?: No Itching?: No  Eyes Blurred vision?: No Double vision?: No  Ears/Nose/Throat Sore throat?: No Sinus problems?: No  Hematologic/Lymphatic Swollen glands?: No Easy bruising?: No  Cardiovascular Leg swelling?: No Chest pain?: No  Respiratory Cough?: No Shortness of breath?: No  Endocrine Excessive thirst?: No  Musculoskeletal Back pain?:  Yes Joint pain?: No  Neurological Headaches?: No Dizziness?: No  Psychologic Depression?: No Anxiety?: No  Physical Exam: BP 133/84 mmHg  Pulse 84  Temp(Src) 97 F (36.1 C) (Oral)  Ht 5\' 9"  (1.753 m)  Wt 218 lb 1.6 oz (98.93 kg)  BMI 32.19 kg/m2  Constitutional: Well nourished. Alert and oriented, No acute distress. HEENT: [place] AT, moist mucus membranes. Trachea midline, no masses. Cardiovascular: No clubbing, cyanosis, or edema. Respiratory: Normal respiratory effort, no increased work of breathing. GI: Abdomen is soft, non tender, non distended, no abdominal masses. Liver and spleen not palpable.  No hernias appreciated.  Stool sample for occult testing is not indicated.   GU: No CVA tenderness.  No bladder fullness or masses.   Skin: No rashes, bruises or suspicious lesions. Lymph: No cervical or inguinal adenopathy. Neurologic: Grossly intact, no focal deficits, moving all 4 extremities. Psychiatric: Normal mood and affect.  Laboratory Data: Lab Results  Component Value Date   WBC 6.9 01/17/2013   HGB 16.2 01/17/2013   HCT 47.7 01/17/2013   MCV 90 01/17/2013   PLT 264 01/17/2013    Lab Results  Component Value Date   CREATININE 0.72 01/17/2013     Pertinent Imaging: CLINICAL DATA: Nephrolithiasis  EXAM: RENAL / URINARY TRACT ULTRASOUND COMPLETE  COMPARISON: CT abdomen and pelvis December 18, 2014  FINDINGS: Right Kidney:  Length: 11.6 cm. Echogenicity and renal cortical thickness are within normal limits. No mass, perinephric fluid, or hydronephrosis visualized. No sonographically demonstrable calculus or ureterectasis.  Left Kidney:  Length: 13.3 cm. Echogenicity and renal cortical thickness are within normal limits. No perinephric fluid or hydronephrosis visualized. There is a cyst in the upper to midportion of the left kidney measuring 2.7 x 1.9 x 1.6 cm. No sonographically demonstrable calculus or  ureterectasis.  Bladder:  Appears normal for degree of bladder distention.  IMPRESSION: Cyst upper to mid left kidney. No other renal masses appreciable. No obstructing focus. No renal calculi appreciable by ultrasound. Study otherwise unremarkable.   Electronically Signed  By: Lowella Grip III M.D.  On: 09/03/2015 16:21   Assessment & Plan:    1. Left lateral abdominal pain:   Patient is having a colonoscopy on Friday.  So far, no urological etiology has been found for his pain.   He will also need to follow up with his PCP for this pain.  He will contact me regarding the findings of his colonoscopy.    Return for patient to contact me with colonoscopy report.  Zara Council, [facility] [address], [place], [place] [address] [phone]

## 2015-09-24 ENCOUNTER — Encounter: Payer: Self-pay | Admitting: General Surgery

## 2015-10-02 ENCOUNTER — Ambulatory Visit (INDEPENDENT_AMBULATORY_CARE_PROVIDER_SITE_OTHER): Payer: BLUE CROSS/BLUE SHIELD | Admitting: General Surgery

## 2015-10-02 ENCOUNTER — Encounter: Payer: Self-pay | Admitting: General Surgery

## 2015-10-02 VITALS — BP 130/76 | HR 76 | Resp 14 | Ht 69.0 in | Wt 214.0 lb

## 2015-10-02 DIAGNOSIS — Z1211 Encounter for screening for malignant neoplasm of colon: Secondary | ICD-10-CM

## 2015-10-02 MED ORDER — POLYETHYLENE GLYCOL 3350 17 GM/SCOOP PO POWD: ORAL | Status: DC

## 2015-10-02 NOTE — Patient Instructions (Addendum)
Colonoscopy A colonoscopy is an exam to look at the entire large intestine (colon). This exam can help find problems such as tumors, polyps, inflammation, and areas of bleeding. The exam takes about 1 hour.  LET [facility] CARE PROVIDER KNOW ABOUT:   Any allergies you have.  All medicines you are taking, including vitamins, herbs, eye drops, creams, and over-the-counter medicines.  Previous problems you or members of your family have had with the use of anesthetics.  Any blood disorders you have.  Previous surgeries you have had.  Medical conditions you have. RISKS AND COMPLICATIONS  Generally, this is a safe procedure. However, as with any procedure, complications can occur. Possible complications include:  Bleeding.  Tearing or rupture of the colon wall.  Reaction to medicines given during the exam.  Infection (rare). BEFORE THE PROCEDURE   Ask your health care provider about changing or stopping your regular medicines.  You may be prescribed an oral bowel prep. This involves drinking a large amount of medicated liquid, starting the day before your procedure. The liquid will cause you to have multiple loose stools until your stool is almost clear or light green. This cleans out your colon in preparation for the procedure.  Do not eat or drink anything else once you have started the bowel prep, unless your health care provider tells you it is safe to do so.  Arrange for someone to drive you home after the procedure. PROCEDURE   You will be given medicine to help you relax (sedative).  You will lie on your side with your knees bent.  A long, flexible tube with a light and camera on the end (colonoscope) will be inserted through the rectum and into the colon. The camera sends video back to a computer screen as it moves through the colon. The colonoscope also releases carbon dioxide gas to inflate the colon. This helps your health care provider see the area better.  During  the exam, your health care provider may take a small tissue sample (biopsy) to be examined under a microscope if any abnormalities are found.  The exam is finished when the entire colon has been viewed. AFTER THE PROCEDURE   Do not drive for 24 hours after the exam.  You may have a small amount of blood in your stool.  You may pass moderate amounts of gas and have mild abdominal cramping or bloating. This is caused by the gas used to inflate your colon during the exam.  Ask when your test results will be ready and how you will get your results. Make sure you get your test results.   This information is not intended to replace advice given to you by your health care provider. Make sure you discuss any questions you have with your health care provider.   Document Released: 10/09/2000 Document Revised: 08/02/2013 Document Reviewed: 06/19/2013 Elsevier Interactive Patient Education [employer].     Patient has been scheduled for a colonoscopy on 11-20-15 at [facility].

## 2015-10-02 NOTE — Progress Notes (Signed)
Patient ID: Shawn Atkins, male   DOB: 1955/12/09, 59 y.o.   MRN: [id number]  Chief Complaint  Patient presents with  . Colonoscopy    HPI Shawn Atkins is a 59 y.o. male here today for a evaluation of a screening colonoscopy. Patient states no GI problems at this time. No family history of colon cancer. Patient states he has been having some left flank pain and has a history of kidney stones. His left flank pain has been intermittent and has caused him to lose work on occasions. Has had CT and [place] numerous times. I have reviewed the history of present illness with the patient. HPI  Past Medical History  Diagnosis Date  . Nocturia   . Frequency   . BPH (benign prostatic hypertrophy)   . Flank pain   . Spermatocele   . Overweight   . HTN (hypertension)   . Gross hematuria   . Inguinal hernia   . Stricture of urethra   . Chronic prostatitis   . Kidney stones     Past Surgical History  Procedure Laterality Date  . Joint replacement      knee  . Kidney stone surgery    . Open lithotomy    . Hernia repair Left     umbilical and inguinal     Family History  Problem Relation Age of Onset  . Kidney disease Neg Hx   . Prostate cancer Neg Hx   . Benign prostatic hyperplasia Father     Social History Social History  Substance Use Topics  . Smoking status: Former Research scientist (life sciences)  . Smokeless tobacco: Current User    Types: Chew  . Alcohol Use: 0.6 oz/week    1 Cans of beer per week     Comment: moderate    Allergies  Allergen Reactions  . Penicillins Anaphylaxis  . Morphine And Related Nausea And Vomiting    Current Outpatient Prescriptions  Medication Sig Dispense Refill  . Multiple Vitamin (MULTIVITAMIN) tablet Take 1 tablet by mouth daily.    . tadalafil (CIALIS) 5 MG tablet Take 1 tablet (5 mg total) by mouth daily as needed for erectile dysfunction. 90 tablet 3  . polyethylene glycol powder (GLYCOLAX/MIRALAX) powder 255 grams one bottle for colonoscopy prep 255 g 0    No current facility-administered medications for this visit.    Review of Systems Review of Systems  Constitutional: Negative.   Respiratory: Negative.   Cardiovascular: Negative.     Blood pressure 130/76, pulse 76, resp. rate 14, height 5\' 9"  (1.753 m), weight 214 lb (97.07 kg).  Physical Exam Physical Exam  Constitutional: He is oriented to person, place, and time. He appears well-developed and well-nourished.  Eyes: Conjunctivae are normal. No scleral icterus.  Neck: Neck supple.  Cardiovascular: Normal rate, regular rhythm and normal heart sounds.   Pulmonary/Chest: Effort normal and breath sounds normal.  Abdominal: Soft. Bowel sounds are normal. There is no hepatomegaly. There is no tenderness. No hernia.    Lymphadenopathy:    He has no cervical adenopathy.  Neurological: He is alert and oriented to person, place, and time.  Skin: Skin is warm and dry.    Data Reviewed Notes reviewed CT from 2/16 showed diverticulosis, no diverticulitis. Also has gallstones. Assessment    Patient in need of screening colonoscopy. His left flank pain remains unresolved. It is unlikely colonoscopy will show any reason for this pain. He does have bilateral kidney stones.     Plan   Pt  is agreeable to screening colonoscopy  Colonoscopy with possible biopsy/polypectomy prn: Information regarding the procedure, including its potential risks and complications (including but not limited to perforation of the bowel, which may require emergency surgery to repair, and bleeding) was verbally given to the patient. Educational information regarding lower intestinal endoscopy was given to the patient. Written instructions for how to complete the bowel prep using Miralax were provided. The importance of drinking ample fluids to avoid dehydration as a result of the prep emphasized.    Patient has been scheduled for a colonoscopy on 11-20-15 at [facility].  PCP:  French Ana 10/02/2015, 1:02 PM

## 2015-11-14 ENCOUNTER — Other Ambulatory Visit: Payer: Self-pay | Admitting: General Surgery

## 2015-11-14 DIAGNOSIS — Z1211 Encounter for screening for malignant neoplasm of colon: Secondary | ICD-10-CM

## 2015-11-19 ENCOUNTER — Encounter: Payer: Self-pay | Admitting: *Deleted

## 2015-11-20 ENCOUNTER — Encounter: Payer: Self-pay | Admitting: *Deleted

## 2015-11-20 ENCOUNTER — Ambulatory Visit: Payer: BLUE CROSS/BLUE SHIELD | Admitting: Anesthesiology

## 2015-11-20 ENCOUNTER — Encounter
Admission: RE | Disposition: A | Discharge status: Home / Self Care | Payer: Self-pay | Source: Ambulatory Visit | Attending: General Surgery

## 2015-11-20 ENCOUNTER — Ambulatory Visit
Admission: RE | Admit: 2015-11-20 | Discharge: 2015-11-20 | Disposition: A | Discharge status: Home / Self Care | Payer: BLUE CROSS/BLUE SHIELD | Source: Ambulatory Visit | Attending: General Surgery | Admitting: General Surgery

## 2015-11-20 DIAGNOSIS — I10 Essential (primary) hypertension: Secondary | ICD-10-CM | POA: Diagnosis not present

## 2015-11-20 DIAGNOSIS — K5730 Diverticulosis of large intestine without perforation or abscess without bleeding: Secondary | ICD-10-CM | POA: Diagnosis not present

## 2015-11-20 DIAGNOSIS — D123 Benign neoplasm of transverse colon: Secondary | ICD-10-CM | POA: Insufficient documentation

## 2015-11-20 DIAGNOSIS — Z880 Allergy status to penicillin: Secondary | ICD-10-CM | POA: Diagnosis not present

## 2015-11-20 DIAGNOSIS — N400 Enlarged prostate without lower urinary tract symptoms: Secondary | ICD-10-CM | POA: Insufficient documentation

## 2015-11-20 DIAGNOSIS — K644 Residual hemorrhoidal skin tags: Secondary | ICD-10-CM | POA: Insufficient documentation

## 2015-11-20 DIAGNOSIS — R109 Unspecified abdominal pain: Secondary | ICD-10-CM | POA: Insufficient documentation

## 2015-11-20 DIAGNOSIS — Z1211 Encounter for screening for malignant neoplasm of colon: Secondary | ICD-10-CM | POA: Insufficient documentation

## 2015-11-20 DIAGNOSIS — E663 Overweight: Secondary | ICD-10-CM | POA: Diagnosis not present

## 2015-11-20 DIAGNOSIS — D120 Benign neoplasm of cecum: Secondary | ICD-10-CM | POA: Insufficient documentation

## 2015-11-20 DIAGNOSIS — Z87442 Personal history of urinary calculi: Secondary | ICD-10-CM | POA: Diagnosis not present

## 2015-11-20 DIAGNOSIS — K621 Rectal polyp: Secondary | ICD-10-CM | POA: Insufficient documentation

## 2015-11-20 HISTORY — PX: COLONOSCOPY WITH PROPOFOL: SHX5780

## 2015-11-20 SURGERY — COLONOSCOPY WITH PROPOFOL
Anesthesia: General

## 2015-11-20 MED ORDER — PROPOFOL 500 MG/50ML IV EMUL
INTRAVENOUS | Status: DC | PRN
  Administered 2015-11-20: 160 ug/kg/min via INTRAVENOUS

## 2015-11-20 MED ORDER — FENTANYL CITRATE (PF) 100 MCG/2ML IJ SOLN
INTRAMUSCULAR | Status: DC | PRN
  Administered 2015-11-20: 50 ug via INTRAVENOUS

## 2015-11-20 MED ORDER — LIDOCAINE HCL (CARDIAC) 20 MG/ML IV SOLN
INTRAVENOUS | Status: DC | PRN
  Administered 2015-11-20: 40 mg via INTRAVENOUS

## 2015-11-20 MED ORDER — MIDAZOLAM HCL 2 MG/2ML IJ SOLN
INTRAMUSCULAR | Status: DC | PRN
  Administered 2015-11-20: 1 mg via INTRAVENOUS

## 2015-11-20 MED ORDER — SODIUM CHLORIDE 0.9 % IV SOLN
INTRAVENOUS | Status: DC
  Administered 2015-11-20: 09:00:00 via INTRAVENOUS

## 2015-11-20 MED ORDER — PROPOFOL 10 MG/ML IV BOLUS
INTRAVENOUS | Status: DC | PRN
Start: 2015-11-20 — End: 2015-11-20
  Administered 2015-11-20: 50 mg via INTRAVENOUS

## 2015-11-20 MED ORDER — PHENYLEPHRINE HCL 10 MG/ML IJ SOLN
INTRAMUSCULAR | Status: DC | PRN
  Administered 2015-11-20: 100 ug via INTRAVENOUS

## 2015-11-20 MED ORDER — LIDOCAINE HCL 2 % EX GEL
CUTANEOUS | Status: DC | PRN
  Administered 2015-11-20: 1 via TOPICAL

## 2015-11-20 NOTE — Anesthesia Preprocedure Evaluation (Signed)
Anesthesia Evaluation  Patient identified by MRN, date of birth, ID band Patient awake    Reviewed: Allergy & Precautions, NPO status , Patient's Chart, lab work & pertinent test results  Airway Mallampati: III  TM Distance: >3 FB     Dental  (+) Missing, Chipped   Pulmonary former smoker,    Pulmonary exam normal breath sounds clear to auscultation       Cardiovascular hypertension, Normal cardiovascular exam     Neuro/Psych negative neurological ROS  negative psych ROS   GI/Hepatic Left abdominal pain   Endo/Other    Renal/GU stones  negative genitourinary   Musculoskeletal   Abdominal Normal abdominal exam  (+)   Peds negative pediatric ROS (+)  Hematology   Anesthesia Other Findings BPH  Reproductive/Obstetrics                             Anesthesia Physical Anesthesia Plan  ASA: II  Anesthesia Plan: General   Post-op Pain Management:    Induction: Intravenous  Airway Management Planned: Nasal Cannula  Additional Equipment:   Intra-op Plan:   Post-operative Plan:   Informed Consent: I have reviewed the patients History and Physical, chart, labs and discussed the procedure including the risks, benefits and alternatives for the proposed anesthesia with the patient or authorized representative who has indicated his/her understanding and acceptance.   Dental advisory given  Plan Discussed with: CRNA and Surgeon  Anesthesia Plan Comments:         Anesthesia Quick Evaluation

## 2015-11-20 NOTE — H&P (Signed)
Shawn Atkins is an 60 y.o. male.   Chief Complaint: here for planned colonoscopy HPI: No gi complaints. Has had some left flank pain, has history of renal stones  Past Medical History  Diagnosis Date  . Nocturia   . Frequency   . BPH (benign prostatic hypertrophy)   . Flank pain   . Spermatocele   . Overweight   . HTN (hypertension)   . Gross hematuria   . Inguinal hernia   . Stricture of urethra   . Chronic prostatitis   . Kidney stones     Past Surgical History  Procedure Laterality Date  . Joint replacement      knee  . Kidney stone surgery    . Open lithotomy    . Hernia repair Left     umbilical and inguinal     Family History  Problem Relation Age of Onset  . Kidney disease Neg Hx   . Prostate cancer Neg Hx   . Benign prostatic hyperplasia Father    Social History:  reports that he has quit smoking. He has never used smokeless tobacco. He reports that he drinks about 0.6 oz of alcohol per week. He reports that he does not use illicit drugs.  Allergies:  Allergies  Allergen Reactions  . Penicillins Anaphylaxis  . Morphine And Related Nausea And Vomiting    Medications Prior to Admission  Medication Sig Dispense Refill  . Multiple Vitamin (MULTIVITAMIN) tablet Take 1 tablet by mouth daily.    . polyethylene glycol powder (GLYCOLAX/MIRALAX) powder 255 grams one bottle for colonoscopy prep 255 g 0  . tadalafil (CIALIS) 5 MG tablet Take 1 tablet (5 mg total) by mouth daily as needed for erectile dysfunction. 90 tablet 3    No results found for this or any previous visit (from the past 48 hour(s)). No results found.  Review of Systems  Constitutional: Negative.   Respiratory: Negative.   Cardiovascular: Negative.   Gastrointestinal: Negative.   Genitourinary: Negative.     Blood pressure 143/81, pulse 82, temperature 99 F (37.2 C), temperature source Tympanic, resp. rate 20, height 5\' 9"  (1.753 m), weight 214 lb (97.07 kg), SpO2 99 %. Physical Exam   Constitutional: He is oriented to person, place, and time. He appears well-nourished.  Eyes: Conjunctivae are normal. No scleral icterus.  Neck: Neck supple.  Cardiovascular: Normal rate, regular rhythm and normal heart sounds.   Respiratory: Effort normal and breath sounds normal.  GI: Soft. Bowel sounds are normal. He exhibits no mass. There is no tenderness.  Neurological: He is alert and oriented to person, place, and time.  Skin: Skin is warm and dry.  Psychiatric: He has a normal mood and affect.     Assessment/Plan Proceed with colonoscopy as planned.   SANKAR,SEEPLAPUTHUR G 11/20/2015, 9:51 AM

## 2015-11-20 NOTE — Transfer of Care (Signed)
Immediate Anesthesia Transfer of Care Note  Patient: Shawn Atkins  Procedure(s) Performed: Procedure(s): COLONOSCOPY WITH PROPOFOL (N/A)  Patient Location: PACU and Endoscopy Unit  Anesthesia Type:General  Level of Consciousness: sedated  Airway & Oxygen Therapy: Patient Spontanous Breathing and Patient connected to nasal cannula oxygen  Post-op Assessment: Report given to RN and Post -op Vital signs reviewed and stable  Post vital signs: Reviewed and stable  Last Vitals:  Filed Vitals:   11/20/15 1100 11/20/15 1117  BP:  124/73  Pulse:  70  Temp: 35.9 C 36.1 C  Resp:  16    Complications: No apparent anesthesia complications

## 2015-11-20 NOTE — Op Note (Signed)
[facility]astroenterology Patient Name: Shawn Atkins Procedure Date: 11/20/2015 10:03 AM MRN: [id number] Account #: [id number] Date of Birth: 06/09/1956 Admit Type: Outpatient Age: 60 Room: [facility] ENDO ROOM 1 Gender: Male Note Status: Finalized Procedure:         Colonoscopy Indications:       Screening for colorectal malignant neoplasm Providers:         Tinzlee Craker G. Jamal Collin, MD Referring MD:      Reyes Ivan, MD (Referring MD) Medicines:         General Anesthesia Complications:     No immediate complications. Procedure:         Pre-Anesthesia Assessment:                    - General anesthesia under the supervision of an                     anesthesiologist was determined to be medically necessary                     for this procedure based on review of the patient's                     medical history, medications, and prior anesthesia history.                    After obtaining informed consent, the colonoscope was                     passed under direct vision. Throughout the procedure, the                     patient's blood pressure, pulse, and oxygen saturations                     were monitored continuously. The Colonoscope was                     introduced through the anus and advanced to the the cecum,                     identified by the ileocecal valve. The colonoscopy was                     performed without difficulty. The patient tolerated the                     procedure well. The quality of the bowel preparation was                     good. Findings:      The perianal exam findings include a skin tag.      Multiple small and large-mouthed diverticula were found in the entire       colon.      [Number] sessile polyps were found in the rectum. The polyps were 3 mm       in size. These polyps were removed with a hot snare. Resection and       retrieval were complete.      Three sessile polyps were found in the transverse colon. The  polyps were       5 mm in size. These polyps were removed with a hot snare. Resection and       retrieval were complete.  Two sessile, non-bleeding polyps were found at the splenic flexure. The       polyps were 3 mm in size. These polyps were removed with a hot biopsy       forceps. Resection and retrieval were complete.      A 3 mm polyp was found in the cecum. The polyp was sessile. The polyp       was removed with a hot biopsy forceps. Resection and retrieval were       complete.      Multiple sessile polyps were found in the rectum. The polyps were 2 mm       in size. These polyps were removed with a cold biopsy forceps. Resection       and retrieval were complete.      The exam was otherwise without abnormality on direct and retroflexion       views. Impression:        - Perianal skin tag found on perianal exam.                    - Diverticulosis in the entire examined colon.                    - [Number] 3 mm polyps in the rectum. Resected and                     retrieved.                    - Three 5 mm polyps in the transverse colon. Resected and                     retrieved.                    - Two 3 mm, non-bleeding polyps at the splenic flexure.                     Resected and retrieved.                    - One 3 mm polyp in the cecum. Resected and retrieved.                    - Multiple benign appearing 2 mm polyps in the rectum.                     Resected and retrieved.                    - The examination was otherwise normal on direct and                     retroflexion views. Recommendation:    - Repeat colonoscopy in 1 year for surveillance. Procedure Code(s): --- Professional ---                    [phone], Colonoscopy, flexible; with removal of tumor(s),                     polyp(s), or other lesion(s) by snare technique                    45384, 59, Colonoscopy, flexible; with removal of                     tumor(s), polyp(s),  or other lesion(s) by hot  biopsy                     forceps                    45380, 59, Colonoscopy, flexible; with biopsy, single or                     multiple Diagnosis Code(s): --- Professional ---                    Z12.11, Encounter for screening for malignant neoplasm of                     colon                    D12.3, Benign neoplasm of transverse colon                    K62.1, Rectal polyp                    D12.0, Benign neoplasm of cecum                    K64.4, Residual hemorrhoidal skin tags CPT copyright 2014 American Medical Association. All rights reserved. The codes documented in this report are preliminary and upon coder review may  be revised to meet current compliance requirements. Christene Lye, MD 11/20/2015 1:00:46 PM This report has been signed electronically. Number of Addenda: 0 Note Initiated On: 11/20/2015 10:03 AM Scope Withdrawal Time: 0 hours 24 minutes 25 seconds  Total Procedure Duration: 0 hours 58 minutes 38 seconds       [facility]

## 2015-11-20 NOTE — Anesthesia Procedure Notes (Signed)
Date/Time: 11/20/2015 10:04 AM Performed by: Doreen Salvage Pre-anesthesia Checklist: Patient identified, Emergency Drugs available, Suction available and Patient being monitored Patient Re-evaluated:Patient Re-evaluated prior to inductionOxygen Delivery Method: Nasal cannula Intubation Type: IV induction Dental Injury: Teeth and Oropharynx as per pre-operative assessment  Comments: Nasal cannula with etCO2 monitoring

## 2015-11-21 LAB — SURGICAL PATHOLOGY

## 2015-11-21 NOTE — Anesthesia Postprocedure Evaluation (Signed)
Anesthesia Post Note  Patient: Shawn Atkins  Procedure(s) Performed: Procedure(s) (LRB): COLONOSCOPY WITH PROPOFOL (N/A)  Patient location during evaluation: PACU Anesthesia Type: General Level of consciousness: awake and alert and oriented Pain management: pain level controlled Vital Signs Assessment: post-procedure vital signs reviewed and stable Respiratory status: spontaneous breathing Cardiovascular status: blood pressure returned to baseline Anesthetic complications: no    Last Vitals:  Filed Vitals:   11/20/15 1135 11/20/15 1145  BP: 118/89 109/90  Pulse: 72 64  Temp:    Resp: 16 16    Last Pain:  Filed Vitals:   11/21/15 0750  PainSc: 0-No pain                 Parris Signer

## 2015-11-28 ENCOUNTER — Ambulatory Visit: Payer: BLUE CROSS/BLUE SHIELD | Admitting: Surgery

## 2016-05-06 ENCOUNTER — Ambulatory Visit
Admission: EM | Admit: 2016-05-06 | Discharge: 2016-05-06 | Disposition: A | Discharge status: Home / Self Care | Payer: BLUE CROSS/BLUE SHIELD | Attending: Family Medicine | Admitting: Family Medicine

## 2016-05-06 DIAGNOSIS — N400 Enlarged prostate without lower urinary tract symptoms: Secondary | ICD-10-CM | POA: Insufficient documentation

## 2016-05-06 DIAGNOSIS — M545 Low back pain, unspecified: Secondary | ICD-10-CM

## 2016-05-06 DIAGNOSIS — M940 Chondrocostal junction syndrome [Tietze]: Secondary | ICD-10-CM

## 2016-05-06 DIAGNOSIS — R0602 Shortness of breath: Secondary | ICD-10-CM | POA: Diagnosis present

## 2016-05-06 DIAGNOSIS — Z87891 Personal history of nicotine dependence: Secondary | ICD-10-CM | POA: Diagnosis not present

## 2016-05-06 DIAGNOSIS — R05 Cough: Secondary | ICD-10-CM | POA: Diagnosis not present

## 2016-05-06 DIAGNOSIS — R059 Cough, unspecified: Secondary | ICD-10-CM

## 2016-05-06 DIAGNOSIS — M549 Dorsalgia, unspecified: Secondary | ICD-10-CM | POA: Diagnosis present

## 2016-05-06 DIAGNOSIS — I10 Essential (primary) hypertension: Secondary | ICD-10-CM | POA: Insufficient documentation

## 2016-05-06 DIAGNOSIS — Z880 Allergy status to penicillin: Secondary | ICD-10-CM | POA: Insufficient documentation

## 2016-05-06 LAB — URINALYSIS COMPLETE WITH MICROSCOPIC (ARMC ONLY)
BACTERIA UA: NONE SEEN
BILIRUBIN URINE: NEGATIVE
Glucose, UA: 500 mg/dL — AB
Ketones, ur: NEGATIVE mg/dL
LEUKOCYTES UA: NEGATIVE
Nitrite: NEGATIVE
PH: 5.5 (ref 5.0–8.0)
PROTEIN: NEGATIVE mg/dL
Specific Gravity, Urine: 1.015 (ref 1.005–1.030)

## 2016-05-06 NOTE — Discharge Instructions (Signed)

## 2016-05-06 NOTE — ED Notes (Signed)
Patient states that he has been having lower back pain on his right side. Patient reports that he is also having some left "kidney" pain, patient states that he knows that he has a stone located. Patient states that back pain and kidney pain has been ongoing for one week. Patient states that 3 nights ago he woke up in the middle of the night coughing, reports tenderness over chest and shortness of breath. Patient states that he has not been feeling good since. Patient states that since coughing episode has been fatigue and weak.

## 2016-05-06 NOTE — ED Provider Notes (Signed)
CSN: [id number]     Arrival date & time 05/06/16  [id number] History   First MD Initiated Contact with Patient 05/06/16 [phone]     Chief Complaint  Patient presents with  . Back Pain  . Shortness of Breath   (Consider location/radiation/quality/duration/timing/severity/associated sxs/prior Treatment) HPI Comments: 60 yo male with a c/o low back and low ribs pain bilaterally since waking up with severe coughing 3 nights ago. States pain is worse with movement and palpation. Denies any fevers, chills, shortness of breath, dysuria.   The history is provided by the patient.    Past Medical History  Diagnosis Date  . Nocturia   . Frequency   . BPH (benign prostatic hypertrophy)   . Flank pain   . Spermatocele   . Overweight   . HTN (hypertension)   . Gross hematuria   . Inguinal hernia   . Stricture of urethra   . Chronic prostatitis   . Kidney stones    Past Surgical History  Procedure Laterality Date  . Joint replacement      knee  . Kidney stone surgery    . Open lithotomy    . Hernia repair Left     umbilical and inguinal   . Colonoscopy with propofol N/A 11/20/2015    Procedure: COLONOSCOPY WITH PROPOFOL;  Surgeon: Christene Lye, MD;  Location: [facility] ENDOSCOPY;  Service: Endoscopy;  Laterality: N/A;   Family History  Problem Relation Age of Onset  . Kidney disease Neg Hx   . Prostate cancer Neg Hx   . Benign prostatic hyperplasia Father    Social History  Substance Use Topics  . Smoking status: Former Research scientist (life sciences)  . Smokeless tobacco: Never Used  . Alcohol Use: 0.6 oz/week    1 Cans of beer per week     Comment: moderate    Review of Systems  Allergies  Penicillins and Morphine and related  Home Medications   Prior to Admission medications   Medication Sig Start Date End Date Taking? Authorizing Provider  metFORMIN (GLUCOPHAGE) 1000 MG tablet Take 1,000 mg by mouth 2 (two) times daily with a meal.   Yes Historical Provider, MD  Multiple Vitamin (MULTIVITAMIN)  tablet Take 1 tablet by mouth daily.   Yes Historical Provider, MD  tadalafil (CIALIS) 5 MG tablet Take 1 tablet (5 mg total) by mouth daily as needed for erectile dysfunction. 08/27/15  Yes Shannon A McGowan, PA-C  polyethylene glycol powder (GLYCOLAX/MIRALAX) powder 255 grams one bottle for colonoscopy prep 10/02/15   Seeplaputhur Robinette Haines, MD   Meds Ordered and Administered this Visit  Medications - No data to display  BP 145/74 mmHg  Pulse 73  Temp(Src) 97.9 F (36.6 C) (Oral)  Resp 17  Ht 5\' 9"  (1.753 m)  Wt 211 lb (95.709 kg)  BMI 31.15 kg/m2  SpO2 98% No data found.   Physical Exam  Constitutional: He appears well-developed and well-nourished. No distress.  HENT:  Head: Normocephalic and atraumatic.  Right Ear: Tympanic membrane, external ear and ear canal normal.  Left Ear: Tympanic membrane, external ear and ear canal normal.  Nose: Nose normal.  Mouth/Throat: Uvula is midline, oropharynx is clear and moist and mucous membranes are normal. No oropharyngeal exudate or tonsillar abscesses.  Eyes: Conjunctivae and EOM are normal. Pupils are equal, round, and reactive to light. Right eye exhibits no discharge. Left eye exhibits no discharge. No scleral icterus.  Neck: Normal range of motion. Neck supple. No tracheal deviation present. No thyromegaly present.  Cardiovascular: Normal rate, regular rhythm and normal heart sounds.   Pulmonary/Chest: Effort normal and breath sounds normal. No stridor. No respiratory distress. He has no wheezes. He has no rales. He exhibits no tenderness.  Lymphadenopathy:    He has no cervical adenopathy.  Neurological: He is alert.  Skin: Skin is warm and dry. No rash noted. He is not diaphoretic.  Nursing note and vitals reviewed.   ED Course  Procedures (including critical care time)  Labs Review Labs Reviewed  URINALYSIS COMPLETEWITH MICROSCOPIC ([facility] ONLY) - Abnormal; Notable for the following:    Glucose, UA 500 (*)    Hgb urine  dipstick TRACE (*)    Squamous Epithelial / LPF 0-5 (*)    All other components within normal limits    Imaging Review No results found.   Visual Acuity Review  Right Eye Distance:   Left Eye Distance:   Bilateral Distance:    Right Eye Near:   Left Eye Near:    Bilateral Near:         MDM   1. Bilateral low back pain without sciatica   2. Cough   3. Costochondritis    Discharge Medication List as of 05/06/2016  9:14 AM     1. diagnosis reviewed with patient 2. rx as per orders above; reviewed possible side effects, interactions, risks and benefits  3. Recommend supportive treatment with otc analgesics prn  4. Follow-up prn if symptoms worsen or don't improve    Norval Gable, MD 05/06/16 1129

## 2016-08-02 ENCOUNTER — Ambulatory Visit (INDEPENDENT_AMBULATORY_CARE_PROVIDER_SITE_OTHER): Payer: BLUE CROSS/BLUE SHIELD

## 2016-08-02 ENCOUNTER — Encounter: Payer: Self-pay | Admitting: Emergency Medicine

## 2016-08-02 ENCOUNTER — Ambulatory Visit
Admission: EM | Admit: 2016-08-02 | Discharge: 2016-08-02 | Disposition: A | Discharge status: Home / Self Care | Payer: BLUE CROSS/BLUE SHIELD | Attending: Family Medicine | Admitting: Family Medicine

## 2016-08-02 DIAGNOSIS — J209 Acute bronchitis, unspecified: Secondary | ICD-10-CM

## 2016-08-02 MED ORDER — DOXYCYCLINE HYCLATE 100 MG PO CAPS: 100.0000 mg | ORAL_CAPSULE | Freq: Two times a day (BID) | ORAL | 0 refills | Status: DC

## 2016-08-02 MED ORDER — PREDNISONE 50 MG PO TABS: ORAL_TABLET | ORAL | 0 refills | Status: DC

## 2016-08-02 NOTE — ED Triage Notes (Signed)
Patient c/o cough and chest congestion for 3 weeks.  Patient was already treated with a Z-Pack.

## 2016-08-02 NOTE — ED Provider Notes (Signed)
[facility]    CSN: [id number] Arrival date & time: 08/02/16  1033  History   Chief Complaint Chief Complaint  Patient presents with  . Cough   HPI  60 year old male presents with complaints of cough, congestion, and shortness of breath.  Patient states that he's been sick for the past 3 weeks. He's been experiencing severe productive cough, congestion, and associated shortness of breath. He also reports associated chest tightness from the cough. He states that he recently saw his primary care physician approximately 5 days ago and was treated with azithromycin, steroid injection, and was given an inhaler as well. He states that he has had no improvement with treatment. In fact, he states that he feels like he is getting worse. No associated fever. No known exacerbating factors. He is a former smoker.  Past Medical History:  Diagnosis Date  . BPH (benign prostatic hypertrophy)   . Chronic prostatitis   . Flank pain   . Frequency   . Gross hematuria   . HTN (hypertension)   . Inguinal hernia   . Kidney stones   . Nocturia   . Overweight   . Spermatocele   . Stricture of urethra    Patient Active Problem List   Diagnosis Date Noted  . Left lateral abdominal pain 08/23/2015  . BPH with obstruction/lower urinary tract symptoms 08/23/2015   Past Surgical History:  Procedure Laterality Date  . COLONOSCOPY WITH PROPOFOL N/A 11/20/2015   Procedure: COLONOSCOPY WITH PROPOFOL;  Surgeon: Christene Lye, MD;  Location: [facility];  Service: Endoscopy;  Laterality: N/A;  . HERNIA REPAIR Left    umbilical and inguinal   . JOINT REPLACEMENT     knee  . KIDNEY STONE SURGERY    . open lithotomy      Home Medications    Prior to Admission medications   Medication Sig Start Date End Date Taking? Authorizing Provider  doxycycline (VIBRAMYCIN) 100 MG capsule Take 1 capsule (100 mg total) by mouth 2 (two) times daily. 08/02/16   Coral Spikes, DO  metFORMIN  (GLUCOPHAGE) 1000 MG tablet Take 1,000 mg by mouth 2 (two) times daily with a meal.    Historical Provider, MD  Multiple Vitamin (MULTIVITAMIN) tablet Take 1 tablet by mouth daily.    Historical Provider, MD  polyethylene glycol powder (GLYCOLAX/MIRALAX) powder 255 grams one bottle for colonoscopy prep 10/02/15   Seeplaputhur Robinette Haines, MD  predniSONE (DELTASONE) 50 MG tablet 1 tablet daily x 5 days. 08/02/16   Coral Spikes, DO  tadalafil (CIALIS) 5 MG tablet Take 1 tablet (5 mg total) by mouth daily as needed for erectile dysfunction. 08/27/15   Nori Riis, PA-C   Family History Family History  Problem Relation Age of Onset  . Benign prostatic hyperplasia Father   . Kidney disease Neg Hx   . Prostate cancer Neg Hx    Social History Social History  Substance Use Topics  . Smoking status: Former Research scientist (life sciences)  . Smokeless tobacco: Never Used  . Alcohol use 0.6 oz/week    1 Cans of beer per week     Comment: moderate   Allergies   Penicillins and Morphine and related  Review of Systems Review of Systems  Constitutional: Positive for fatigue.  HENT: Positive for congestion and ear pain.   Respiratory: Positive for cough, chest tightness and shortness of breath.   All other systems reviewed and are negative.  Physical Exam Triage Vital Signs ED Triage Vitals  Enc Vitals Group     BP 08/02/16 1050 129/76     Pulse Rate 08/02/16 1050 96     Resp 08/02/16 1050 16     Temp 08/02/16 1050 97.9 F (36.6 C)     Temp Source 08/02/16 1050 Tympanic     SpO2 08/02/16 1050 98 %     Weight 08/02/16 1049 208 lb (94.3 kg)     Height 08/02/16 1049 5\' 9"  (1.753 m)     Head Circumference --      Peak Flow --      Pain Score 08/02/16 1050 2     Pain Loc --      Pain Edu? --      Excl. in [place]? --    Updated Vital Signs BP 129/76 (BP Location: Right Arm)   Pulse 96   Temp 97.9 F (36.6 C) (Tympanic)   Resp 16   Ht 5\' 9"  (1.753 m)   Wt 208 lb (94.3 kg)   SpO2 98%   BMI 30.72 kg/m    Physical Exam  Constitutional: He is oriented to person, place, and time. He appears well-developed.  Appears fatigued. NAD.  HENT:  Head: Normocephalic and atraumatic.  Mouth/Throat: Oropharynx is clear and moist.  Normal TMs bilaterally.  Eyes: Conjunctivae are normal.  Neck: Neck supple.  Cardiovascular: Normal rate and regular rhythm.   Pulmonary/Chest: Effort normal. No respiratory distress. He has no wheezes. He has no rales.  Abdominal: Soft. He exhibits no distension. There is no tenderness. There is no rebound and no guarding.  Musculoskeletal: Normal range of motion.  Lymphadenopathy:    He has no cervical adenopathy.  Neurological: He is alert and oriented to person, place, and time.  Skin: Skin is warm. No rash noted.  Psychiatric: He has a normal mood and affect.  Vitals reviewed.  UC Treatments / Results  Labs (all labs ordered are listed, but only abnormal results are displayed) Labs Reviewed - No data to display  EKG  EKG Interpretation None      Radiology Dg Chest 2 View  Result Date: 08/02/2016 CLINICAL DATA:  Pt with SOB and cough x three weeks. Pt has a hx of pneumonia, last bout was two years ago. Hx of left upper rib fractures 40 years ago. EXAM: CHEST  2 VIEW COMPARISON:  05/01/2014 FINDINGS: The heart, mediastinum and hila are unremarkable. Lungs are mildly hyperexpanded but clear. No pleural effusion or pneumothorax. Skeletal structures are intact. IMPRESSION: No active cardiopulmonary disease. Electronically Signed   By: Lajean Manes M.D.   On: 08/02/2016 12:03    Procedures Procedures (including critical care time)  Medications Ordered in UC Medications - No data to display  Initial Impression / Assessment and Plan / UC Course  I have reviewed the triage vital signs and the nursing notes.  Pertinent labs & imaging results that were available during my care of the patient were reviewed by me and considered in my medical decision making (see  chart for details).  Clinical Course  60 year old male presents with productive cough and SOB. Not improving with treatment - Azithromycin, Steroid injection, inhaler. Obtaining Chest xray.  1215 - chest x-ray negative for acute cardiopulmonary disease. Given patient's prior smoking history, there is concern for underlying COPD. Given that as well as the duration of his illness, treating with prednisone and doxycycline.  Final Clinical Impressions(s) / UC Diagnoses   Final diagnoses:  Acute bronchitis, unspecified organism   New Prescriptions Discharge  Medication List as of 08/02/2016 12:10 PM    START taking these medications   Details  doxycycline (VIBRAMYCIN) 100 MG capsule Take 1 capsule (100 mg total) by mouth 2 (two) times daily., Starting Sun 08/02/2016, Print    predniSONE (DELTASONE) 50 MG tablet 1 tablet daily x 5 days., Print         Coral Spikes, DO 08/02/16 1216

## 2016-08-04 ENCOUNTER — Telehealth: Payer: Self-pay

## 2016-08-04 NOTE — Telephone Encounter (Signed)
Courtesy call back completed today after patient's visit at [facility]. Patient improved some and followed up with primary care today. Patient will call back with any questions or concerns.

## 2016-10-31 ENCOUNTER — Other Ambulatory Visit: Payer: Self-pay | Admitting: Urology

## 2016-10-31 DIAGNOSIS — N138 Other obstructive and reflux uropathy: Secondary | ICD-10-CM

## 2016-10-31 DIAGNOSIS — N401 Enlarged prostate with lower urinary tract symptoms: Principal | ICD-10-CM

## 2016-11-11 ENCOUNTER — Ambulatory Visit: Payer: BLUE CROSS/BLUE SHIELD | Admitting: Urology

## 2016-11-18 ENCOUNTER — Ambulatory Visit (INDEPENDENT_AMBULATORY_CARE_PROVIDER_SITE_OTHER): Payer: BLUE CROSS/BLUE SHIELD | Admitting: Urology

## 2016-11-18 ENCOUNTER — Encounter: Payer: Self-pay | Admitting: Urology

## 2016-11-18 VITALS — BP 115/70 | HR 88 | Ht 69.0 in | Wt 214.0 lb

## 2016-11-18 DIAGNOSIS — N138 Other obstructive and reflux uropathy: Secondary | ICD-10-CM | POA: Diagnosis not present

## 2016-11-18 DIAGNOSIS — N401 Enlarged prostate with lower urinary tract symptoms: Secondary | ICD-10-CM

## 2016-11-18 DIAGNOSIS — N529 Male erectile dysfunction, unspecified: Secondary | ICD-10-CM

## 2016-11-18 MED ORDER — TADALAFIL 5 MG PO TABS: 5.0000 mg | ORAL_TABLET | Freq: Every day | ORAL | 3 refills | Status: DC | PRN

## 2016-11-18 NOTE — Progress Notes (Signed)
11/18/2016 4:40 PM   Shawn Atkins 1956/07/03 [id number]  Referring provider: Lynnell Jude, MD [address] [place], [place] [id number]  Chief Complaint  Patient presents with  . Medication Refill    HPI: Patient is a 61 year old Caucasian male who presents today requesting a refill on this prescription Cialis 5 mg daily.  Has been over one year since the patient's last visit.  BPH WITH LUTS His IPSS score today is 14, which is moderate lower urinary tract symptomatology. He is mostly satisfied with his quality life due to his urinary symptoms.    His major complaint today urgency and nocturia, but these are controlled when he takes Cialis. He has been without Cialis for 3 weeks.  He denies any dysuria, hematuria or suprapubic pain.   He also denies any recent fevers, chills, nausea or vomiting.  He does not have a family history of PCa.     IPSS    Row Name 11/18/16 1600         International Prostate Symptom Score   How often have you had the sensation of not emptying your bladder? Less than 1 in 5     How often have you had to urinate less than every two hours? Less than half the time     How often have you found you stopped and started again several times when you urinated? Less than half the time     How often have you found it difficult to postpone urination? About half the time     How often have you had a weak urinary stream? Less than half the time     How often have you had to strain to start urination? Less than 1 in 5 times     How many times did you typically get up at night to urinate? 3 Times     Total IPSS Score 14       Quality of Life due to urinary symptoms   If you were to spend the rest of your life with your urinary condition just the way it is now how would you feel about that? Mostly Satisfied        Score:  1-7 Mild 8-19 Moderate 20-35 Severe  Erectile dysfunction His SHIM score is 17, which is mild ED.   He has been having difficulty with  erections for several years.   His major complaint is firmness with erections.  His libido is preserved.   His risk factors for ED are age and BPH. He denies any painful erections or curvatures with his erections.   He has tried Cialis in the past with good results.      SHIM    Row Name 11/18/16 1629         SHIM: Over the last 6 months:   How do you rate your confidence that you could get and keep an erection? Moderate     When you had erections with sexual stimulation, how often were your erections hard enough for penetration (entering your partner)? Sometimes (about half the time)     During sexual intercourse, how often were you able to maintain your erection after you had penetrated (entered) your partner? Difficult     During sexual intercourse, how difficult was it to maintain your erection to completion of intercourse? Slightly Difficult     When you attempted sexual intercourse, how often was it satisfactory for you? Slightly Difficult       SHIM Total  Score   SHIM 17        Score: 1-7 Severe ED 8-11 Moderate ED 12-16 Mild-Moderate ED 17-21 Mild ED 22-25 No ED      PMH: Past Medical History:  Diagnosis Date  . BPH (benign prostatic hypertrophy)   . Chronic prostatitis   . Flank pain   . Frequency   . Gross hematuria   . HTN (hypertension)   . Inguinal hernia   . Kidney stones   . Nocturia   . Overweight   . Spermatocele   . Stricture of urethra     Surgical History: Past Surgical History:  Procedure Laterality Date  . COLONOSCOPY WITH PROPOFOL N/A 11/20/2015   Procedure: COLONOSCOPY WITH PROPOFOL;  Surgeon: Christene Lye, MD;  Location: [facility];  Service: Endoscopy;  Laterality: N/A;  . HERNIA REPAIR Left    umbilical and inguinal   . JOINT REPLACEMENT     knee  . KIDNEY STONE SURGERY    . open lithotomy      Home Medications:  Allergies as of 11/18/2016      Reactions   Penicillins Anaphylaxis   Morphine And Related Nausea And  Vomiting      Medication List       Accurate as of 11/18/16  4:40 PM. Always use your most recent med list.          doxycycline 100 MG capsule Commonly known as:  VIBRAMYCIN Take 1 capsule (100 mg total) by mouth 2 (two) times daily.   metFORMIN 1000 MG tablet Commonly known as:  GLUCOPHAGE Take 1,000 mg by mouth 2 (two) times daily with a meal.   multivitamin tablet Take 1 tablet by mouth daily.   polyethylene glycol powder powder Commonly known as:  GLYCOLAX/MIRALAX 255 grams one bottle for colonoscopy prep   predniSONE 50 MG tablet Commonly known as:  DELTASONE 1 tablet daily x 5 days.   tadalafil 5 MG tablet Commonly known as:  CIALIS Take 1 tablet (5 mg total) by mouth daily as needed for erectile dysfunction.       Allergies:  Allergies  Allergen Reactions  . Penicillins Anaphylaxis  . Morphine And Related Nausea And Vomiting    Family History: Family History  Problem Relation Age of Onset  . Benign prostatic hyperplasia Father   . Kidney disease Neg Hx   . Prostate cancer Neg Hx     Social History:  reports that he has quit smoking. He has never used smokeless tobacco. He reports that he drinks about 0.6 oz of alcohol per week . He reports that he does not use drugs.  ROS: UROLOGY Frequent Urination?: No Hard to postpone urination?: No Burning/pain with urination?: No Get up at night to urinate?: No Leakage of urine?: No Urine stream starts and stops?: No Trouble starting stream?: No Do you have to strain to urinate?: No Blood in urine?: No Urinary tract infection?: No Sexually transmitted disease?: No Injury to kidneys or bladder?: No Painful intercourse?: No Weak stream?: No Erection problems?: No Penile pain?: No  Gastrointestinal Nausea?: No Vomiting?: No Indigestion/heartburn?: No Diarrhea?: No Constipation?: No  Constitutional Fever: No Night sweats?: No Weight loss?: No Fatigue?: No  Skin Skin rash/lesions?:  No Itching?: No  Eyes Blurred vision?: No Double vision?: No  Ears/Nose/Throat Sore throat?: No Sinus problems?: No  Hematologic/Lymphatic Swollen glands?: No Easy bruising?: No  Cardiovascular Leg swelling?: No Chest pain?: No  Respiratory Cough?: No Shortness of breath?: No  Endocrine Excessive thirst?:  No  Musculoskeletal Back pain?: No Joint pain?: No  Neurological Headaches?: No Dizziness?: No  Psychologic Depression?: No Anxiety?: No  Physical Exam: BP 115/70 (BP Location: Left Arm, Patient Position: Sitting, Cuff Size: Large)   Pulse 88   Ht 5\' 9"  (1.753 m)   Wt 214 lb (97.1 kg)   BMI 31.60 kg/m   Constitutional: Well nourished. Alert and oriented, No acute distress. HEENT: [place] AT, moist mucus membranes. Trachea midline, no masses. Cardiovascular: No clubbing, cyanosis, or edema. Respiratory: Normal respiratory effort, no increased work of breathing. GI: Abdomen is soft, non tender, non distended, no abdominal masses. Liver and spleen not palpable.  No hernias appreciated.  Stool sample for occult testing is not indicated.   GU: No CVA tenderness.  No bladder fullness or masses.  Patient with circumcised phallus.  Urethral meatus is patent.  No penile discharge. No penile lesions or rashes. Scrotum without lesions, cysts, rashes and/or edema.  Testicles are located scrotally bilaterally. No masses are appreciated in the testicles. Left and right epididymis are normal. Rectal: Patient with  normal sphincter tone. Anus and perineum without scarring or rashes. No rectal masses are appreciated. Prostate is approximately 45 grams, no nodules are appreciated. Seminal vesicles are normal. Skin: No rashes, bruises or suspicious lesions. Lymph: No cervical or inguinal adenopathy. Neurologic: Grossly intact, no focal deficits, moving all 4 extremities. Psychiatric: Normal mood and affect.  Laboratory Data: Lab Results  Component Value Date   WBC 6.9  01/17/2013   HGB 16.2 01/17/2013   HCT 47.7 01/17/2013   MCV 90 01/17/2013   PLT 264 01/17/2013    Lab Results  Component Value Date   CREATININE 0.72 01/17/2013     Assessment & Plan:   1. BPH with obstruction/lower urinary tract symptoms  - IPSS score is 14/2   - Continue conservative management, avoiding bladder irritants and timed voiding's  - Continue Cialis 5 mg daily; refills given  - RTC in 12 months for IPSS, PSA and exam   - PSA  2. Erectile dysfunction  - SHIM score is 17  - Continue Cialis  - RTC in 12 months for repeat SHIM score and exam    Return in about 1 year (around 11/18/2017) for IPSS, SHIM, PSA and exam.  These notes generated with voice recognition software. I apologize for typographical errors.  Zara Council, [facility] [address], [place], [place] [address] [phone]

## 2016-11-19 ENCOUNTER — Telehealth: Payer: Self-pay

## 2016-11-19 LAB — PSA: Prostate Specific Ag, Serum: 0.5 ng/mL (ref 0.0–4.0)

## 2016-11-19 NOTE — Telephone Encounter (Signed)
LMOM

## 2016-11-19 NOTE — Telephone Encounter (Signed)
-----   Message from Nori Riis, PA-C sent at 11/19/2016  8:00 AM EST ----- Please notify the patient that his PSA is stable at 0.5.

## 2016-11-20 NOTE — Telephone Encounter (Signed)
LMOM

## 2016-11-23 NOTE — Telephone Encounter (Signed)
LMOM- will send a letter.  

## 2017-01-13 DIAGNOSIS — M533 Sacrococcygeal disorders, not elsewhere classified: Secondary | ICD-10-CM | POA: Insufficient documentation

## 2017-11-15 ENCOUNTER — Other Ambulatory Visit: Payer: BLUE CROSS/BLUE SHIELD

## 2017-11-15 DIAGNOSIS — N138 Other obstructive and reflux uropathy: Secondary | ICD-10-CM

## 2017-11-15 DIAGNOSIS — N401 Enlarged prostate with lower urinary tract symptoms: Principal | ICD-10-CM

## 2017-11-15 DIAGNOSIS — R300 Dysuria: Secondary | ICD-10-CM

## 2017-11-15 LAB — MICROSCOPIC EXAMINATION: BACTERIA UA: NONE SEEN

## 2017-11-15 LAB — URINALYSIS, COMPLETE
Bilirubin, UA: NEGATIVE
Ketones, UA: NEGATIVE
LEUKOCYTES UA: NEGATIVE
NITRITE UA: NEGATIVE
PH UA: 5.5 (ref 5.0–7.5)
Protein, UA: NEGATIVE
RBC UA: NEGATIVE
Specific Gravity, UA: 1.015 (ref 1.005–1.030)
Urobilinogen, Ur: 0.2 mg/dL (ref 0.2–1.0)

## 2017-11-15 NOTE — Progress Notes (Signed)
11/16/2017 3:22 PM   Shawn Atkins [birth year] [id number]  Referring provider: Lynnell Jude, MD [address] [place], [place] [address]  Chief Complaint  Patient presents with  . Follow-up    Return in about 1 year (around 11/18/2017) for IPSS, SHIM, PSA and exam    HPI: Patient is a 62 year-old Caucasian male with BPH with LU TS and ED who presents for a yearly follow up.  BPH WITH LUTS His IPSS score today is 3,  which is mild lower urinary tract symptomatology. He is pleased with his quality life due to his urinary symptoms.    His previous I PPS score was 14/2.  His major complaint today urgency and nocturia, but these are controlled when he takes Cialis. He has been without Cialis for 3 weeks.  He denies any dysuria, hematuria or suprapubic pain.   He also denies any recent fevers, chills, nausea or vomiting.  He does not have a family history of PCa. IPSS    Row Name 11/16/17 1400         International Prostate Symptom Score   How often have you had the sensation of not emptying your bladder?  Not at All     How often have you had to urinate less than every two hours?  Less than 1 in 5 times     How often have you found you stopped and started again several times when you urinated?  Not at All     How often have you found it difficult to postpone urination?  Less than 1 in 5 times     How often have you had a weak urinary stream?  Not at All     How often have you had to strain to start urination?  Not at All     How many times did you typically get up at night to urinate?  1 Time     Total IPSS Score  3       Quality of Life due to urinary symptoms   If you were to spend the rest of your life with your urinary condition just the way it is now how would you feel about that?  Pleased        Score:  1-7 Mild 8-19 Moderate 20-35 Severe  Erectile dysfunction His SHIM score is 25, which is no ED.   His previous SHIM score was 17.  He has been having difficulty with  erections for several years.   His major complaint is firmness with erections.  His libido is preserved.   His risk factors for ED are DM, age and BPH. He denies any painful erections or curvatures with his erections.   He has tried Cialis in the past with good results.  SHIM    Row Name 11/16/17 1455         SHIM: Over the last 6 months:   How do you rate your confidence that you could get and keep an erection?  Very High     When you had erections with sexual stimulation, how often were your erections hard enough for penetration (entering your partner)?  Almost Always or Always     During sexual intercourse, how often were you able to maintain your erection after you had penetrated (entered) your partner?  Almost Always or Always     During sexual intercourse, how difficult was it to maintain your erection to completion of intercourse?  Not Difficult  When you attempted sexual intercourse, how often was it satisfactory for you?  Almost Always or Always       SHIM Total Score   SHIM  25        Score: 1-7 Severe ED 8-11 Moderate ED 12-16 Mild-Moderate ED 17-21 Mild ED 22-25 No ED   Patient presented to the office yesterday for lab work and had a complaint of dysuria.  UA and urine cultures were obtained.  UA was positive for 3+ glucose.  Urine culture is pending.  He has been having some left-sided hip pain that occurs after a days work.   PMH: Past Medical History:  Diagnosis Date  . BPH (benign prostatic hypertrophy)   . Chronic prostatitis   . Flank pain   . Frequency   . Gross hematuria   . HTN (hypertension)   . Inguinal hernia   . Kidney stones   . Nocturia   . Overweight   . Spermatocele   . Stricture of urethra     Surgical History: Past Surgical History:  Procedure Laterality Date  . COLONOSCOPY WITH PROPOFOL N/A 11/20/2015   Procedure: COLONOSCOPY WITH PROPOFOL;  Surgeon: Christene Lye, MD;  Location: [facility] ENDOSCOPY;  Service: Endoscopy;   Laterality: N/A;  . HERNIA REPAIR Left    umbilical and inguinal   . JOINT REPLACEMENT     knee  . KIDNEY STONE SURGERY    . open lithotomy      Home Medications:  Allergies as of 11/16/2017      Reactions   Penicillins Anaphylaxis   Morphine And Related Nausea And Vomiting   Other       Medication List        Accurate as of 11/16/17  3:22 PM. Always use your most recent med list.          azithromycin 250 MG tablet Commonly known as:  ZITHROMAX azithromycin 250 mg tablet   benzonatate 100 MG capsule Commonly known as:  TESSALON benzonatate 100 mg capsule   clarithromycin 500 MG tablet Commonly known as:  BIAXIN clarithromycin 500 mg tablet   cyclobenzaprine 10 MG tablet Commonly known as:  FLEXERIL cyclobenzaprine 10 mg tablet  TAKE 1 TABLET BY MOUTH NIGHTLY FOR PAIN   doxycycline 100 MG capsule Commonly known as:  VIBRAMYCIN Take 1 capsule (100 mg total) by mouth 2 (two) times daily.   fluticasone 50 MCG/ACT nasal spray Commonly known as:  FLONASE fluticasone 50 mcg/actuation nasal spray,suspension   gabapentin 300 MG capsule Commonly known as:  NEURONTIN gabapentin 300 mg capsule   ibuprofen 800 MG tablet Commonly known as:  ADVIL,MOTRIN ibuprofen 800 mg tablet   levofloxacin 500 MG tablet Commonly known as:  LEVAQUIN levofloxacin 500 mg tablet   lisinopril 5 MG tablet Commonly known as:  PRINIVIL,ZESTRIL lisinopril 5 mg tablet   meloxicam 7.5 MG tablet Commonly known as:  MOBIC meloxicam 7.5 mg tablet   meloxicam 15 MG tablet Commonly known as:  MOBIC meloxicam 15 mg tablet   metFORMIN 1000 MG tablet Commonly known as:  GLUCOPHAGE Take 1,000 mg by mouth 2 (two) times daily with a meal.   metFORMIN 1000 MG (MOD) 24 hr tablet Commonly known as:  GLUMETZA   methocarbamol 500 MG tablet Commonly known as:  ROBAXIN methocarbamol 500 mg tablet   multivitamin tablet Take 1 tablet by mouth daily.   polyethylene glycol powder  powder Commonly known as:  GLYCOLAX/MIRALAX 255 grams one bottle for colonoscopy prep   predniSONE 50  MG tablet Commonly known as:  DELTASONE 1 tablet daily x 5 days.   PROAIR HFA 108 (90 Base) MCG/ACT inhaler Generic drug:  albuterol ProAir HFA 90 mcg/actuation aerosol inhaler   albuterol (2.5 MG/3ML) 0.083% nebulizer solution Commonly known as:  PROVENTIL albuterol sulfate 2.5 mg/3 mL (0.083 %) solution for nebulization   sulfamethoxazole-trimethoprim 800-160 MG tablet Commonly known as:  BACTRIM DS,SEPTRA DS sulfamethoxazole 800 mg-trimethoprim 160 mg tablet   tadalafil 5 MG tablet Commonly known as:  CIALIS Take 1 tablet (5 mg total) by mouth daily as needed for erectile dysfunction.   traMADol 50 MG tablet Commonly known as:  ULTRAM tramadol 50 mg tablet       Allergies:  Allergies  Allergen Reactions  . Penicillins Anaphylaxis  . Morphine And Related Nausea And Vomiting  . Other     Family History: Family History  Problem Relation Age of Onset  . Benign prostatic hyperplasia Father   . Kidney disease Neg Hx   . Prostate cancer Neg Hx     Social History:  reports that he has quit smoking. he has never used smokeless tobacco. He reports that he drinks about 0.6 oz of alcohol per week. He reports that he does not use drugs.  ROS: UROLOGY Frequent Urination?: No Hard to postpone urination?: No Burning/pain with urination?: No Get up at night to urinate?: No Leakage of urine?: No Urine stream starts and stops?: No Trouble starting stream?: No Do you have to strain to urinate?: No Blood in urine?: No Urinary tract infection?: No Sexually transmitted disease?: No Injury to kidneys or bladder?: No Painful intercourse?: No Weak stream?: No Erection problems?: No Penile pain?: No  Gastrointestinal Nausea?: No Vomiting?: No Indigestion/heartburn?: No Diarrhea?: No Constipation?: No  Constitutional Fever: No Night sweats?: No Weight loss?:  No Fatigue?: No  Skin Skin rash/lesions?: No Itching?: No  Eyes Blurred vision?: No Double vision?: No  Ears/Nose/Throat Sore throat?: No Sinus problems?: No  Hematologic/Lymphatic Swollen glands?: No Easy bruising?: No  Cardiovascular Leg swelling?: No Chest pain?: No  Respiratory Cough?: No Shortness of breath?: No  Endocrine Excessive thirst?: No  Musculoskeletal Back pain?: Yes Joint pain?: No  Neurological Headaches?: No Dizziness?: No  Psychologic Depression?: No Anxiety?: No  Physical Exam: BP 136/83   Pulse 85   Ht 5\' 9"  (1.753 m)   Wt 222 lb (100.7 kg)   BMI 32.78 kg/m   Constitutional: Well nourished. Alert and oriented, No acute distress. HEENT: [place] AT, moist mucus membranes. Trachea midline, no masses. Cardiovascular: No clubbing, cyanosis, or edema. Respiratory: Normal respiratory effort, no increased work of breathing. GI: Abdomen is soft, non tender, non distended, no abdominal masses. Liver and spleen not palpable.  No hernias appreciated.  Stool sample for occult testing is not indicated.   GU: No CVA tenderness.  No bladder fullness or masses.  Patient with circumcised phallus.  Urethral meatus is patent.  No penile discharge. No penile lesions or rashes. Scrotum without lesions, cysts, rashes and/or edema.  Testicles are located scrotally bilaterally. No masses are appreciated in the testicles. Left and right epididymis are normal. Rectal: Patient with  normal sphincter tone. Anus and perineum without scarring or rashes. No rectal masses are appreciated. Prostate is approximately 45 grams, no nodules are appreciated. Seminal vesicles are normal. Skin: No rashes, bruises or suspicious lesions. Lymph: No cervical or inguinal adenopathy. Neurologic: Grossly intact, no focal deficits, moving all 4 extremities. Psychiatric: Normal mood and affect.  Laboratory Data: Component  Latest Ref Rng & Units 11/18/2016 11/15/2017  Prostate  Specific Ag, Serum     0.0 - 4.0 ng/mL 0.5 0.6    Lab Results  Component Value Date   WBC 6.9 01/17/2013   HGB 16.2 01/17/2013   HCT 47.7 01/17/2013   MCV 90 01/17/2013   PLT 264 01/17/2013    Lab Results  Component Value Date   CREATININE 0.72 01/17/2013   I have reviewed the labs.  Assessment & Plan:   1. BPH with obstruction/lower urinary tract symptoms  - IPSS score is 3/1, it is stable  - Continue conservative management, avoiding bladder irritants and timed voiding's  - Continue Cialis 5 mg daily; refills given  - RTC in 12 months for IPSS, PSA and exam     2. Erectile dysfunction  - SHIM score is 25, it is stable  - Continue Cialis  - RTC in 12 months for repeat SHIM score and exam   3. Hip pain  - refer to PT   Return in about 1 year (around 11/16/2018) for IPSS, SHIM, PSA and exam.  These notes generated with voice recognition software. I apologize for typographical errors.  Zara Council, [facility] [address] [place], [place] [address] [phone]

## 2017-11-16 ENCOUNTER — Encounter: Payer: Self-pay | Admitting: Urology

## 2017-11-16 ENCOUNTER — Ambulatory Visit (INDEPENDENT_AMBULATORY_CARE_PROVIDER_SITE_OTHER): Payer: BLUE CROSS/BLUE SHIELD | Admitting: Urology

## 2017-11-16 VITALS — BP 136/83 | HR 85 | Ht 69.0 in | Wt 222.0 lb

## 2017-11-16 DIAGNOSIS — N401 Enlarged prostate with lower urinary tract symptoms: Secondary | ICD-10-CM | POA: Diagnosis not present

## 2017-11-16 DIAGNOSIS — N529 Male erectile dysfunction, unspecified: Secondary | ICD-10-CM

## 2017-11-16 DIAGNOSIS — R81 Glycosuria: Secondary | ICD-10-CM

## 2017-11-16 DIAGNOSIS — M25552 Pain in left hip: Secondary | ICD-10-CM

## 2017-11-16 DIAGNOSIS — N138 Other obstructive and reflux uropathy: Secondary | ICD-10-CM | POA: Diagnosis not present

## 2017-11-16 LAB — PSA: PROSTATE SPECIFIC AG, SERUM: 0.6 ng/mL (ref 0.0–4.0)

## 2017-11-16 MED ORDER — TADALAFIL 5 MG PO TABS: 5.0000 mg | ORAL_TABLET | Freq: Every day | ORAL | 3 refills | Status: DC | PRN

## 2017-11-17 LAB — CULTURE, URINE COMPREHENSIVE

## 2017-11-18 ENCOUNTER — Ambulatory Visit: Payer: BLUE CROSS/BLUE SHIELD | Admitting: Urology

## 2018-01-20 ENCOUNTER — Ambulatory Visit: Payer: BLUE CROSS/BLUE SHIELD | Admitting: General Surgery

## 2018-02-17 ENCOUNTER — Ambulatory Visit: Payer: BLUE CROSS/BLUE SHIELD | Admitting: General Surgery

## 2018-03-15 ENCOUNTER — Encounter: Payer: Self-pay | Admitting: *Deleted

## 2018-03-16 ENCOUNTER — Encounter: Payer: Self-pay | Admitting: General Surgery

## 2018-03-16 ENCOUNTER — Ambulatory Visit: Payer: BLUE CROSS/BLUE SHIELD | Admitting: General Surgery

## 2018-03-16 VITALS — BP 140/80 | HR 89 | Resp 14 | Ht 69.0 in | Wt 214.0 lb

## 2018-03-16 DIAGNOSIS — R1032 Left lower quadrant pain: Secondary | ICD-10-CM

## 2018-03-16 NOTE — Patient Instructions (Addendum)
The patient is aware to call back for any questions or new concerns. The patient is aware to use a heating pad as needed for comfort. Resume activities as tolerated. Proper lifting techniques reviewed.

## 2018-03-16 NOTE — Progress Notes (Signed)
Patient ID: Shawn Atkins, male   DOB: 04/11/1956, 62 y.o.   MRN: [id number]  Chief Complaint  Patient presents with  . Abdominal Pain    HPI Shawn Atkins is a 62 y.o. male.  Here today for evaluation of left groin pain referred by Dr Bernie Covey. He is here for left groin pain that has been there for 3 weeks. He had went on a fishing trip from May 6-9 and may have pulled something trying to pull in a large sting ray. He states this week has been better since he has been resting more. Denies fever, vomiting or nausea. Bowels move 1-2 times a day and occasionally he notices left groin pain then as well. He is retired but works 2 days a week at [employer].  He is here with his wife, [name].  HPI  Past Medical History:  Diagnosis Date  . BPH (benign prostatic hypertrophy)   . Bronchitis   . Chronic prostatitis   . Diabetes mellitus without complication ([place])   . Flank pain   . Frequency   . Gross hematuria   . HTN (hypertension)   . Inguinal hernia   . Kidney stones    stones  . Nocturia   . Overweight   . Spermatocele   . Stricture of urethra     Past Surgical History:  Procedure Laterality Date  . COLONOSCOPY WITH PROPOFOL N/A 11/20/2015   Procedure: COLONOSCOPY WITH PROPOFOL;  Surgeon: Christene Lye, MD;  Location: [facility] ENDOSCOPY;  Service: Endoscopy;  Laterality: N/A;  . HERNIA REPAIR Bilateral [id number]   umbilical and bil inguinal/ Dr Marina Gravel  . JOINT REPLACEMENT     knee  . KIDNEY STONE SURGERY    . open lithotomy      Family History  Problem Relation Age of Onset  . Benign prostatic hyperplasia Father   . Kidney disease Neg Hx   . Prostate cancer Neg Hx     Social History Social History   Tobacco Use  . Smoking status: Former Smoker    Years: 15.00    Types: Cigarettes  . Smokeless tobacco: Current User    Types: Chew  Substance Use Topics  . Alcohol use: Yes    Alcohol/week: 0.6 oz    Types: 1 Cans of beer per week    Comment: moderate  . Drug use:  No    Allergies  Allergen Reactions  . Penicillins Anaphylaxis  . Morphine And Related Nausea And Vomiting  . Other     Current Outpatient Medications  Medication Sig Dispense Refill  . albuterol (PROAIR HFA) 108 (90 Base) MCG/ACT inhaler ProAir HFA 90 mcg/actuation aerosol inhaler prn    . albuterol (PROVENTIL) (2.5 MG/3ML) 0.083% nebulizer solution albuterol sulfate 2.5 mg/3 mL (0.083 %) solution for nebulization    . lisinopril (PRINIVIL,ZESTRIL) 5 MG tablet lisinopril 5 mg tablet    . meloxicam (MOBIC) 15 MG tablet meloxicam 15 mg tablet daily    . Multiple Vitamin (MULTIVITAMIN) tablet Take 1 tablet by mouth daily.    . tadalafil (CIALIS) 5 MG tablet Take 1 tablet (5 mg total) by mouth daily as needed for erectile dysfunction. 90 tablet 3  . traMADol (ULTRAM) 50 MG tablet tramadol 50 mg tablet prn    . fluticasone (FLONASE) 50 MCG/ACT nasal spray fluticasone 50 mcg/actuation nasal spray,suspension    . ibuprofen (ADVIL,MOTRIN) 800 MG tablet ibuprofen 800 mg tablet prn     No current facility-administered medications for this visit.  Review of Systems Review of Systems  Constitutional: Negative.   Respiratory: Negative.   Cardiovascular: Negative.   Gastrointestinal: Positive for abdominal pain. Negative for diarrhea and nausea.    Blood pressure 140/80, pulse 89, resp. rate 14, height 5\' 9"  (1.753 m), weight 214 lb (97.1 kg).  Physical Exam Physical Exam  Constitutional: He is oriented to person, place, and time. He appears well-developed and well-nourished.  HENT:  Mouth/Throat: No oropharyngeal exudate.  Eyes: No scleral icterus.  Cardiovascular: Normal rate and regular rhythm.  Pulmonary/Chest: Effort normal and breath sounds normal.  Abdominal: Soft. Bowel sounds are normal. There is tenderness. No hernia. Hernia confirmed negative in the right inguinal area and confirmed negative in the left inguinal area.  Genitourinary: Testes normal.     Genitourinary  Comments: No weakness with cough or straining.  Lymphadenopathy:       Left: No inguinal adenopathy present.  Neurological: He is alert and oriented to person, place, and time.  Skin: Skin is warm and dry.  Psychiatric: His behavior is normal.       Assessment    No evidence of recurrent hernia.    Plan    The patient is aware to use a heating pad as needed for comfort. Resume activities as tolerated. Proper lifting techniques reviewed. Follow up as needed.     HPI, Physical Exam, Assessment and Plan have been scribed under the direction and in the presence of Robert Bellow, MD. Karie Fetch, RN  I have completed the exam and reviewed the above documentation for accuracy and completeness.  I agree with the above.  Haematologist has been used and any errors in dictation or transcription are unintentional.  Hervey Ard, M.D., F.A.C.S.    Forest Gleason Kemoni Ortega 03/18/2018, 6:26 PM

## 2018-03-18 DIAGNOSIS — R1032 Left lower quadrant pain: Secondary | ICD-10-CM | POA: Insufficient documentation

## 2018-04-18 ENCOUNTER — Telehealth: Payer: Self-pay | Admitting: *Deleted

## 2018-04-18 NOTE — Telephone Encounter (Signed)
-----   Message from Robert Bellow, MD sent at 04/15/2018  9:27 AM EDT ----- Patient should be scheduled for a follow-up colonoscopy January 2020.  This will be a 3-year follow-up exam.  Preprocedure visit if he desires. ----- Message ----- From: Carson Myrtle, RN Sent: 03/23/2018   2:50 PM To: Robert Bellow, MD  Dr Bary Castilla pt was just here for groin pain and I saw he was in recalls for 1 year f/u colonoscopy (S[facility]) as well. Please review and advise. Thanks

## 2018-04-18 NOTE — Telephone Encounter (Signed)
Patient want to be seen again for left groin pain. Scheduled on 05/19/2018 @ 2:15pm

## 2018-05-05 ENCOUNTER — Telehealth: Payer: Self-pay | Admitting: Urology

## 2018-05-06 ENCOUNTER — Other Ambulatory Visit: Payer: Self-pay

## 2018-05-06 DIAGNOSIS — N401 Enlarged prostate with lower urinary tract symptoms: Principal | ICD-10-CM

## 2018-05-06 DIAGNOSIS — N138 Other obstructive and reflux uropathy: Secondary | ICD-10-CM

## 2018-05-06 MED ORDER — TADALAFIL 5 MG PO TABS: 5.0000 mg | ORAL_TABLET | Freq: Every day | ORAL | 1 refills | Status: DC | PRN

## 2018-05-06 NOTE — Telephone Encounter (Signed)
Pt has changed pharmacy, [facility]. Will send.

## 2018-05-12 ENCOUNTER — Telehealth: Payer: Self-pay | Admitting: Urology

## 2018-05-12 NOTE — Telephone Encounter (Signed)
Pt called office stating that [facility] needs approval for Cialis, pt stated that he was emailed from [facility] stating that Zara Council needed to call them. Pt states he has been out for a week and requests that a message gets sent. Please advise. Thanks.

## 2018-05-17 NOTE — Telephone Encounter (Signed)
Received fax, information filled out and returned to [facility]. Left Message for pt.

## 2018-05-17 NOTE — Telephone Encounter (Signed)
Pt called back gave him your message   Sharyn Lull

## 2018-05-19 ENCOUNTER — Ambulatory Visit: Payer: BLUE CROSS/BLUE SHIELD | Admitting: General Surgery

## 2018-05-24 ENCOUNTER — Ambulatory Visit
Admission: EM | Admit: 2018-05-24 | Discharge: 2018-05-24 | Disposition: A | Discharge status: Home / Self Care | Payer: BLUE CROSS/BLUE SHIELD | Attending: Family Medicine | Admitting: Family Medicine

## 2018-05-24 ENCOUNTER — Encounter: Payer: Self-pay | Admitting: Emergency Medicine

## 2018-05-24 ENCOUNTER — Other Ambulatory Visit: Payer: Self-pay

## 2018-05-24 DIAGNOSIS — R1032 Left lower quadrant pain: Secondary | ICD-10-CM

## 2018-05-24 MED ORDER — CIPROFLOXACIN HCL 500 MG PO TABS: 500.0000 mg | ORAL_TABLET | Freq: Two times a day (BID) | ORAL | 0 refills | Status: DC

## 2018-05-24 MED ORDER — METRONIDAZOLE 500 MG PO TABS: 500.0000 mg | ORAL_TABLET | Freq: Three times a day (TID) | ORAL | 0 refills | Status: DC

## 2018-05-24 NOTE — ED Provider Notes (Signed)
[facility]    CSN: [id number] Arrival date & time: 05/24/18  0907     History   Chief Complaint Chief Complaint  Patient presents with  . Abdominal Pain  . Rectal Bleeding    HPI Shawn Atkins is a 62 y.o. male.   The history is provided by the patient.  Abdominal Pain  Pain location:  LLQ Pain quality: aching   Pain radiates to:  Does not radiate Pain severity:  Mild Onset quality:  Sudden Duration:  1 day Timing:  Constant Progression:  Worsening Chronicity:  New Context: not alcohol use, not awakening from sleep, not diet changes, not eating, not laxative use, not previous surgeries, not recent illness, not recent sexual activity, not recent travel, not retching, not sick contacts, not suspicious food intake and not trauma   Relieved by:  None tried Worsened by:  Bowel movements Ineffective treatments:  None tried Associated symptoms: hematochezia (states noticed some small amount of blood on the toilet paper and toilet)   Associated symptoms: no anorexia, no belching, no chest pain, no chills, no constipation, no cough, no diarrhea, no dysuria, no fatigue, no fever, no flatus, no hematemesis, no hematuria, no melena, no nausea, no shortness of breath, no sore throat, no vaginal bleeding and no vomiting   Risk factors: no alcohol abuse and no aspirin use   Risk factors comment:  History of diverticulosis and colon polyps; last colonoscopy 2 years ago and recommended to follow up for repeat in 1 year which patient has not done Rectal Bleeding  Associated symptoms: abdominal pain   Associated symptoms: no fever, no hematemesis and no vomiting     Past Medical History:  Diagnosis Date  . BPH (benign prostatic hypertrophy)   . Bronchitis   . Chronic prostatitis   . Diabetes mellitus without complication ([place])   . Flank pain   . Frequency   . Gross hematuria   . HTN (hypertension)   . Inguinal hernia   . Kidney stones    stones  . Nocturia   .  Overweight   . Spermatocele   . Stricture of urethra     Patient Active Problem List   Diagnosis Date Noted  . Left groin pain 03/18/2018  . Sacroiliac joint pain 01/13/2017  . Left lateral abdominal pain 08/23/2015  . BPH with obstruction/lower urinary tract symptoms 08/23/2015  . Biceps tendinitis 06/25/2015    Past Surgical History:  Procedure Laterality Date  . COLONOSCOPY WITH PROPOFOL N/A 11/20/2015   Procedure: COLONOSCOPY WITH PROPOFOL;  Surgeon: Christene Lye, MD;  Location: [facility] ENDOSCOPY;  Service: Endoscopy;  Laterality: N/A;  . HERNIA REPAIR Bilateral 2952   umbilical and bil inguinal/ Dr Marina Gravel  . JOINT REPLACEMENT     knee  . KIDNEY STONE SURGERY    . open lithotomy         Home Medications    Prior to Admission medications   Medication Sig Start Date End Date Taking? Authorizing Provider  lisinopril (PRINIVIL,ZESTRIL) 5 MG tablet lisinopril 5 mg tablet   Yes [provider]  meloxicam (MOBIC) 15 MG tablet meloxicam 15 mg tablet daily   Yes [provider]  metFORMIN (GLUCOPHAGE) 500 MG tablet Take 500 mg by mouth 2 (two) times daily. 04/02/18  Yes [provider]  Multiple Vitamin (MULTIVITAMIN) tablet Take 1 tablet by mouth daily.   Yes [provider]  tadalafil (CIALIS) 5 MG tablet Take 1 tablet (5 mg total) by mouth daily  as needed for erectile dysfunction. 05/06/18  Yes McGowan, Larene Beach A, PA-C  traMADol (ULTRAM) 50 MG tablet tramadol 50 mg tablet prn 06/25/15  Yes [provider]  albuterol (PROAIR HFA) 108 (90 Base) MCG/ACT inhaler ProAir HFA 90 mcg/actuation aerosol inhaler prn    [provider]  albuterol (PROVENTIL) (2.5 MG/3ML) 0.083% nebulizer solution albuterol sulfate 2.5 mg/3 mL (0.083 %) solution for nebulization    [provider]  ciprofloxacin (CIPRO) 500 MG tablet Take 1 tablet (500 mg total) by mouth every 12 (twelve) hours. 05/24/18   Norval Gable, MD  fluticasone  Asencion Islam) 50 MCG/ACT nasal spray fluticasone 50 mcg/actuation nasal spray,suspension 10/13/16   [provider]  ibuprofen (ADVIL,MOTRIN) 800 MG tablet ibuprofen 800 mg tablet prn    [provider]  metroNIDAZOLE (FLAGYL) 500 MG tablet Take 1 tablet (500 mg total) by mouth 3 (three) times daily. 05/24/18   Norval Gable, MD    Family History Family History  Problem Relation Age of Onset  . Benign prostatic hyperplasia Father   . Kidney disease Neg Hx   . Prostate cancer Neg Hx     Social History Social History   Tobacco Use  . Smoking status: Former Smoker    Years: 15.00    Types: Cigarettes  . Smokeless tobacco: Current User    Types: Chew  Substance Use Topics  . Alcohol use: Yes    Alcohol/week: 0.6 oz    Types: 1 Cans of beer per week    Comment: moderate  . Drug use: No     Allergies   Penicillins; Morphine and related; and Other   Review of Systems Review of Systems  Constitutional: Negative for chills, fatigue and fever.  HENT: Negative for sore throat.   Respiratory: Negative for cough and shortness of breath.   Cardiovascular: Negative for chest pain.  Gastrointestinal: Positive for abdominal pain and hematochezia (states noticed some small amount of blood on the toilet paper and toilet). Negative for anorexia, constipation, diarrhea, flatus, hematemesis, melena, nausea and vomiting.  Genitourinary: Negative for dysuria, hematuria and vaginal bleeding.     Physical Exam Triage Vital Signs ED Triage Vitals  Enc Vitals Group     BP 05/24/18 0918 (!) 141/87     Pulse Rate 05/24/18 0918 82     Resp 05/24/18 0918 16     Temp 05/24/18 0918 98.3 F (36.8 C)     Temp Source 05/24/18 0918 Oral     SpO2 05/24/18 0918 96 %     Weight 05/24/18 0914 215 lb (97.5 kg)     Height 05/24/18 0914 5\' 9"  (1.753 m)     Head Circumference --      Peak Flow --      Pain Score 05/24/18 0914 7     Pain Loc --      Pain Edu? --      Excl. in [place]? --      No data found.  Updated Vital Signs BP (!) 141/87 (BP Location: Left Arm)   Pulse 82   Temp 98.3 F (36.8 C) (Oral)   Resp 16   Ht 5\' 9"  (1.753 m)   Wt 215 lb (97.5 kg)   SpO2 96%   BMI 31.75 kg/m   Visual Acuity Right Eye Distance:   Left Eye Distance:   Bilateral Distance:    Right Eye Near:   Left Eye Near:    Bilateral Near:     Physical Exam  Constitutional: He  is oriented to person, place, and time. He appears well-developed and well-nourished. No distress.  HENT:  Head: Normocephalic and atraumatic.  Cardiovascular: Normal rate, regular rhythm, normal heart sounds and intact distal pulses.  No murmur heard. Pulmonary/Chest: Effort normal and breath sounds normal. No respiratory distress. He has no wheezes. He has no rales.  Abdominal: Soft. Bowel sounds are normal. He exhibits no distension and no mass. There is tenderness (left lower quadrant; no rebound or guarding). There is no rebound and no guarding.  Neurological: He is alert and oriented to person, place, and time.  Skin: No rash noted. He is not diaphoretic.  Nursing note and vitals reviewed.    UC Treatments / Results  Labs (all labs ordered are listed, but only abnormal results are displayed) Labs Reviewed - No data to display  EKG None  Radiology No results found.  Procedures Procedures (including critical care time)  Medications Ordered in UC Medications - No data to display  Initial Impression / Assessment and Plan / UC Course  I have reviewed the triage vital signs and the nursing notes.  Pertinent labs & imaging results that were available during my care of the patient were reviewed by me and considered in my medical decision making (see chart for details).      Final Clinical Impressions(s) / UC Diagnoses   Final diagnoses:  Abdominal pain, left lower quadrant  (likely diverticulitis)   Discharge Instructions     Follow up with Gastroenterology and/or surgeon for  colonoscopy follow up    ED Prescriptions    Medication Sig Dispense Auth. Provider   ciprofloxacin (CIPRO) 500 MG tablet Take 1 tablet (500 mg total) by mouth every 12 (twelve) hours. 20 tablet Norval Gable, MD   metroNIDAZOLE (FLAGYL) 500 MG tablet Take 1 tablet (500 mg total) by mouth 3 (three) times daily. 30 tablet Norval Gable, MD      1. diagnosis reviewed with patient 2. rx as per orders above; reviewed possible side effects, interactions, risks and benefits  3. Recommend supportive treatment with clear liquids for next 24-48 hours then advance slowly as tolerated 4. Follow up with GI for colonoscopy  4. Follow-up prn if symptoms worsen or don't improve    Controlled Substance Prescriptions [place] Controlled Substance Registry consulted? Not Applicable   Norval Gable, MD 05/24/18 1056

## 2018-05-24 NOTE — Discharge Instructions (Signed)
Follow up with Gastroenterology and/or surgeon for colonoscopy follow up

## 2018-05-24 NOTE — ED Triage Notes (Signed)
Patient states that this morning after he had a bowel movement, he had a sharp pain on the left side of his abdomen and had some blood in his stool.  Patient states that he is still having pain in his abdomen.  Patient states that he canceled his appointment with his surgeon on July 25.  Patient denies N/V/D.

## 2018-05-31 ENCOUNTER — Ambulatory Visit (INDEPENDENT_AMBULATORY_CARE_PROVIDER_SITE_OTHER): Payer: BLUE CROSS/BLUE SHIELD | Admitting: General Surgery

## 2018-05-31 ENCOUNTER — Encounter: Payer: Self-pay | Admitting: General Surgery

## 2018-05-31 VITALS — BP 144/88 | HR 91 | Resp 14 | Ht 69.0 in | Wt 212.0 lb

## 2018-05-31 DIAGNOSIS — R1032 Left lower quadrant pain: Secondary | ICD-10-CM

## 2018-05-31 DIAGNOSIS — Z8719 Personal history of other diseases of the digestive system: Secondary | ICD-10-CM

## 2018-05-31 NOTE — Progress Notes (Signed)
Patient ID: Shawn Atkins, male   DOB: 1956-09-01, 62 y.o.   MRN: [id number]  Chief Complaint  Patient presents with  . Follow-up    HPI Shawn Atkins is a 62 y.o. male.  Here for re evaluation of left side pain. He went to the Urgent Care on 05-24-18 because the pain became worse. He admits to seeing blood on his stool and was wiping blood when he had a BM but thought it was hemorrhoids. Noticed this 2-3 weeks ago and it is not with every BM. The constant pain is daily, some days worse. Bowels move 2-3 times a day settle in bottom of bowel and is dark. No pain with BM. He states the Urgent Care told him he had diverticulitis and was placed on antibiotics. He thinks the antibiotics have helped and less blood. He is here with his wife, [name].  HPI  Past Medical History:  Diagnosis Date  . BPH (benign prostatic hypertrophy)   . Bronchitis   . Chronic prostatitis   . Diabetes mellitus without complication ([place])   . Flank pain   . Frequency   . Gross hematuria   . HTN (hypertension)   . Inguinal hernia   . Kidney stones    stones  . Nocturia   . Overweight   . Spermatocele   . Stricture of urethra     Past Surgical History:  Procedure Laterality Date  . COLONOSCOPY WITH PROPOFOL N/A 11/20/2015   Procedure: COLONOSCOPY WITH PROPOFOL;  Surgeon: Christene Lye, MD;  Location: [facility] END[facility]COPY;  Service: Endoscopy;  Laterality: N/A;  . HERNIA REPAIR Bilateral 0160   umbilical and bil inguinal/ Dr Marina Gravel  . JOINT REPLACEMENT     knee  . KIDNEY STONE SURGERY    . open lithotomy      Family History  Problem Relation Age of Onset  . Benign prostatic hyperplasia Father   . Kidney disease Neg Hx   . Prostate cancer Neg Hx     Social History Social History   Tobacco Use  . Smoking status: Former Smoker    Years: 15.00    Types: Cigarettes  . Smokeless tobacco: Current User    Types: Chew  Substance Use Topics  . Alcohol use: Yes    Alcohol/week: 0.6 oz    Types: 1  Cans of beer per week    Comment: moderate  . Drug use: No    Allergies  Allergen Reactions  . Penicillins Anaphylaxis  . Morphine And Related Nausea And Vomiting  . Other     Current Outpatient Medications  Medication Sig Dispense Refill  . albuterol (PROAIR HFA) 108 (90 Base) MCG/ACT inhaler ProAir HFA 90 mcg/actuation aerosol inhaler prn    . albuterol (PROVENTIL) (2.5 MG/3ML) 0.083% nebulizer solution albuterol sulfate 2.5 mg/3 mL (0.083 %) solution for nebulization    . ciprofloxacin (CIPRO) 500 MG tablet Take 1 tablet (500 mg total) by mouth every 12 (twelve) hours. 20 tablet 0  . fluticasone (FLONASE) 50 MCG/ACT nasal spray fluticasone 50 mcg/actuation nasal spray,suspension    . ibuprofen (ADVIL,MOTRIN) 800 MG tablet ibuprofen 800 mg tablet prn    . lisinopril (PRINIVIL,ZESTRIL) 5 MG tablet lisinopril 5 mg tablet    . meloxicam (MOBIC) 15 MG tablet meloxicam 15 mg tablet daily    . metFORMIN (GLUCOPHAGE) 500 MG tablet Take 500 mg by mouth 2 (two) times daily.  3  . metroNIDAZOLE (FLAGYL) 500 MG tablet Take 1 tablet (500 mg total) by mouth  3 (three) times daily. 30 tablet 0  . Multiple Vitamin (MULTIVITAMIN) tablet Take 1 tablet by mouth daily.    . tadalafil (CIALIS) 5 MG tablet Take 1 tablet (5 mg total) by mouth daily as needed for erectile dysfunction. 90 tablet 1  . traMADol (ULTRAM) 50 MG tablet tramadol 50 mg tablet prn     No current facility-administered medications for this visit.     Review of Systems Review of Systems  Constitutional: Negative.   Respiratory: Negative.   Cardiovascular: Negative.   Gastrointestinal: Positive for abdominal pain and blood in stool. Negative for constipation and diarrhea.    Blood pressure (!) 144/88, pulse 91, resp. rate 14, height 5\' 9"  (1.753 m), weight 212 lb (96.2 kg), SpO2 97 %.  Physical Exam Physical Exam  Constitutional: He is oriented to person, place, and time. He appears well-developed and well-nourished.  HENT:   Mouth/Throat: No oropharyngeal exudate.  Eyes: Conjunctivae are normal. No scleral icterus.  Neck: Neck supple.  Cardiovascular: Normal rate, regular rhythm and normal heart sounds.  Pulmonary/Chest: Effort normal and breath sounds normal.  Abdominal: Soft. Bowel sounds are normal. He exhibits no distension. There is no hepatosplenomegaly. There is tenderness. No hernia.    Tender over left hip.   Genitourinary: Prostate normal. Rectal exam shows internal hemorrhoid. Rectal exam shows no fissure and anal tone normal.     Genitourinary Comments: Internal hemorrhoids in back. Visible location of blood clot.   Lymphadenopathy:       Right: No inguinal adenopathy present.       Left: No inguinal adenopathy present.  Neurological: He is alert and oriented to person, place, and time.  Skin: Skin is warm and dry.  Psychiatric: His behavior is normal.  Anoscopy was completed with identification of internal hemorrhoids in the right anterior as well as both posterior walls.  Clot on the right anterior internal hemorrhoid without active bleeding.  Data Reviewed November 20, 2015 A. Rectum polyp, cbx  B. Colon polyp, distal transverse, hot snare, hot bx  C. Colon polyp, hepatic flexure, hot snare  D. Colon polyp, proximal transverse, hot snare, hot bx  E. Colon polyp, cecum, hot biopsy  F. Colon polyp, distal transverse, hot biopsy  G. Colon polyp, splenic flexure, hot biopsy  H. Rectum polyp, hot snare   DIAGNOSIS:  A. 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  - NEGATIVE FOR CYTOLOGIC DYSPLASIA AND MALIGNANCY.   B. COLON POLYP, DISTAL TRANSVERSE; HOT SNARE AND HOT BIOPSY:  - TUBULAR ADENOMA, 3 FRAGMENTS.  - NEGATIVE FOR HIGH-GRADE DYSPLASIA AND MALIGNANCY.   C. COLON POLYP, HEPATIC FLEXURE; HOT SNARE:  - TUBULAR ADENOMA.  - NEGATIVE FOR HIGH-GRADE DYSPLASIA AND MALIGNANCY.   D. COLON POLYP, PROXIMAL TRANSVERSE; HOT SNARE AND HOT  BIOPSY:  - TUBULAR ADENOMA.  - NEGATIVE FOR HIGH-GRADE DYSPLASIA AND MALIGNANCY.   E. COLON POLYP, CECUM; HOT BIOPSY:  - TUBULAR ADENOMA.  - NEGATIVE FOR HIGH-GRADE DYSPLASIA AND MALIGNANCY.   F. COLON POLYP, DISTAL TRANSVERSE; HOT BIOPSY:  - TUBULAR ADENOMA.  - NEGATIVE FOR HIGH-GRADE DYSPLASIA AND MALIGNANCY.   G. COLON POLYP, SPLENIC FLEXURE; HOT BIOPSY:  - COLONIC MUCOSA WITH PROMINENT LYMPHOID AGGREGATE.  - NEGATIVE FOR DYSPLASIA AND MALIGNANCY.   H. RECTUM POLYP; HOT SNARE:  - TRADITIONAL SERRATED ADENOMA, SEE NOTE.   Emergency department records of May 24, 2018 reviewed.  Treatment for diverticulitis with Cipro and Flagyl.  No laboratory or imaging studies.  Assessment    Clinical history and compatible with acute diverticulitis.  Pain at the bony prominence of the anterior superior iliac spine rather than the abdominal cavity itself.    Plan    CT abdomen and pelvis has been recommended to help delineate the source of his left lower quadrant/left lateral abdominal wall discomfort.  He will complete the presently prescribed course of oral antibiotics as he is tolerated these well. The patient will be contacted when the CT is available for review.      HPI, Physical Exam, Assessment and Plan have been scribed under the direction and in the presence of Robert Bellow, MD. Karie Fetch, RN  I have completed the exam and reviewed the above documentation for accuracy and completeness.  I agree with the above.  Haematologist has been used and any errors in dictation or transcription are unintentional.  Hervey Ard, M.D., F.A.C.S.  Shawn Atkins 06/01/2018, 6:08 PM

## 2018-05-31 NOTE — Patient Instructions (Addendum)
The patient is aware to call back for any questions or concerns.  CT scan abdomen and pelvis

## 2018-06-01 DIAGNOSIS — Z8719 Personal history of other diseases of the digestive system: Secondary | ICD-10-CM | POA: Insufficient documentation

## 2018-06-28 ENCOUNTER — Other Ambulatory Visit: Payer: Self-pay

## 2018-06-28 ENCOUNTER — Telehealth: Payer: Self-pay

## 2018-06-28 DIAGNOSIS — M898X8 Other specified disorders of bone, other site: Secondary | ICD-10-CM

## 2018-06-28 DIAGNOSIS — R1032 Left lower quadrant pain: Secondary | ICD-10-CM

## 2018-06-28 NOTE — Telephone Encounter (Signed)
Patient's wife called back and says that they have gotten his insurance straightened out. They would like to go ahead and schedule his CT because he is still having pain. The patient is scheduled for a CT abdomen pelvis with contrast at [place] on 07/01/18 at 10:00 am. He will pick up a prep kit and have labs done prior. He will arrive there by 9:45 am and have nothing to eat or drink for 4 hours prior. The patient is aware of date, time, and instructions.

## 2018-06-30 ENCOUNTER — Other Ambulatory Visit
Admission: RE | Admit: 2018-06-30 | Discharge: 2018-06-30 | Disposition: A | Discharge status: Home / Self Care | Payer: BLUE CROSS/BLUE SHIELD | Source: Ambulatory Visit | Attending: General Surgery | Admitting: General Surgery

## 2018-06-30 DIAGNOSIS — R1032 Left lower quadrant pain: Secondary | ICD-10-CM | POA: Insufficient documentation

## 2018-06-30 LAB — CREATININE, SERUM: CREATININE: 0.74 mg/dL (ref 0.61–1.24)

## 2018-06-30 LAB — BUN: BUN: 8 mg/dL (ref 8–23)

## 2018-07-01 ENCOUNTER — Ambulatory Visit
Admission: RE | Admit: 2018-07-01 | Discharge: 2018-07-01 | Disposition: A | Discharge status: Home / Self Care | Payer: BLUE CROSS/BLUE SHIELD | Source: Ambulatory Visit | Attending: General Surgery | Admitting: General Surgery

## 2018-07-01 DIAGNOSIS — I700 Atherosclerosis of aorta: Secondary | ICD-10-CM | POA: Insufficient documentation

## 2018-07-01 DIAGNOSIS — M898X8 Other specified disorders of bone, other site: Secondary | ICD-10-CM | POA: Diagnosis not present

## 2018-07-01 DIAGNOSIS — K8020 Calculus of gallbladder without cholecystitis without obstruction: Secondary | ICD-10-CM | POA: Insufficient documentation

## 2018-07-01 DIAGNOSIS — K760 Fatty (change of) liver, not elsewhere classified: Secondary | ICD-10-CM | POA: Insufficient documentation

## 2018-07-01 DIAGNOSIS — R1032 Left lower quadrant pain: Secondary | ICD-10-CM | POA: Insufficient documentation

## 2018-07-01 DIAGNOSIS — Z9889 Other specified postprocedural states: Secondary | ICD-10-CM | POA: Insufficient documentation

## 2018-07-01 DIAGNOSIS — K5730 Diverticulosis of large intestine without perforation or abscess without bleeding: Secondary | ICD-10-CM | POA: Insufficient documentation

## 2018-07-01 MED ORDER — IOHEXOL 300 MG/ML  SOLN
100.0000 mL | Freq: Once | INTRAMUSCULAR | Status: AC | PRN
  Administered 2018-07-01: 100 mL via INTRAVENOUS

## 2018-07-04 ENCOUNTER — Telehealth: Payer: Self-pay | Admitting: General Surgery

## 2018-07-04 NOTE — Telephone Encounter (Signed)
Patient is calling asking if his CT results are in. Please call patient and advise.

## 2018-07-04 NOTE — Telephone Encounter (Signed)
Spoke with Mrs.Jungbluth to let her know once Dr.Byrnett has reviewed the results we can call and let her know something. Doctor is currently in surgery and patient verbalized understanding.

## 2018-07-05 ENCOUNTER — Telehealth: Payer: Self-pay

## 2018-07-05 NOTE — Telephone Encounter (Signed)
Message left notifying the patient of Dr Dwyane Luo findings and that he will be in contact with him.

## 2018-07-05 NOTE — Telephone Encounter (Signed)
-----   Message from Robert Bellow, MD sent at 07/04/2018  4:56 PM EDT -----  Please notify the patient that the radiologist does not see any new findings from 2016.  I need to review the films with the radiologist in person about an area I have concerned about.  We will contact him when this is occurred, likely no sooner than Wednesday. ----- Message ----- From: Interface, Rad Results In Sent: 07/01/2018   3:10 PM EDT To: Robert Bellow, MD

## 2018-07-11 NOTE — Telephone Encounter (Signed)
I had reviewed the CT with a second radiologist last week, and indeed there is a ill-defined area in the abdominal cavity medial to the anterior superior iliac spine on the left that was not present on the prior CT exams.  Is not apparently associated with the bowel or clearly associated with an appendices epiploica or the abdominal wall musculature.  No bony involvement.  The source for the patient's right iliac wing pain is unclear.  He reports he still having daily bleeding, and an internal hemorrhoid was identified on his exam in early August.  We will make arrangements for an office visit in the near future and in between now and then he will make use of a bio freeze patch to the symptomatic area on the left hip/ASIS to see if this is beneficial.  The patient had multiple polyps at the time of his January 2017 colonoscopy and is due for repeat exam in January 2020 at the outside.  The CT findings are subtle, and they do not clinically correlate with his report of pain, but will reexamine at his next follow-up.  Options would include observation with repeat scan in 6 months, CT-guided biopsy or operative biopsy.

## 2018-07-11 NOTE — Telephone Encounter (Signed)
Patient's spouse is calling to find out her husbands results on the CT. They can be reached at [phone]. Please call and advise.

## 2018-07-28 ENCOUNTER — Ambulatory Visit: Payer: BLUE CROSS/BLUE SHIELD | Admitting: General Surgery

## 2018-11-14 ENCOUNTER — Other Ambulatory Visit: Payer: Self-pay

## 2018-11-14 ENCOUNTER — Other Ambulatory Visit: Payer: BLUE CROSS/BLUE SHIELD

## 2018-11-14 DIAGNOSIS — R972 Elevated prostate specific antigen [PSA]: Secondary | ICD-10-CM

## 2018-11-15 LAB — PSA: Prostate Specific Ag, Serum: 0.2 ng/mL (ref 0.0–4.0)

## 2018-11-16 NOTE — Progress Notes (Signed)
11/18/2018 10:07 AM   Shawn Atkins 05/06/1956 [id number]  Referring provider: Lynnell Jude, MD [address] [place], [place] [address]  Chief Complaint  Patient presents with  . Benign Prostatic Hypertrophy    HPI: Shawn Atkins is a 63 y.o. male Caucasian with BPH with LU TS and ED who presents for annual follow up.  Background History Patient presented to the office 11/15/2017 for lab work and had a complaint of dysuria.  UA and urine cultures were obtained.  UA was positive for 3+ glucose.  Urine culture found Lactobacillus species.  He had also been having some left-sided hip pain that occurs after a days work.  BPH WITH LUTS  (prostate and/or bladder) IPSS score: 8/1  Previous score: 3/1  Major complaint(s):  Frequency and nocturia x several  years.  Denies any dysuria, hematuria or suprapubic pain.  Currently taking: Cialis.  Denies any recent fevers, chills, nausea or vomiting.  He does not have a family history of PCa.  IPSS    Row Name 11/18/18 0900         International Prostate Symptom Score   How often have you had the sensation of not emptying your bladder?  Less than 1 in 5     How often have you had to urinate less than every two hours?  Less than half the time     How often have you found you stopped and started again several times when you urinated?  Not at All     How often have you found it difficult to postpone urination?  Less than 1 in 5 times     How often have you had a weak urinary stream?  Less than 1 in 5 times     How often have you had to strain to start urination?  Less than 1 in 5 times     How many times did you typically get up at night to urinate?  2 Times     Total IPSS Score  8       Quality of Life due to urinary symptoms   If you were to spend the rest of your life with your urinary condition just the way it is now how would you feel about that?  Pleased        Score:  1-7 Mild 8-19 Moderate 20-35 Severe  Erectile  dysfunction His SHIM score is 20, which is mild ED.  His previous SHIM score was 25.  He has been having difficulty with erections for several years.   His major complaint is getting and maintaining an erection.   His risk factors for ED are age, BPH, and DM.  He denies any painful erections or curvatures with his erections.  He is still having spontaneous erections.  He has tried Cialis in the past with good results. SHIM    Row Name 11/18/18 [phone]         SHIM: Over the last 6 months:   How do you rate your confidence that you could get and keep an erection?  Moderate     When you had erections with sexual stimulation, how often were your erections hard enough for penetration (entering your partner)?  Most Times (much more than half the time)     During sexual intercourse, how often were you able to maintain your erection after you had penetrated (entered) your partner?  Most Times (much more than half the time)     During  sexual intercourse, how difficult was it to maintain your erection to completion of intercourse?  Slightly Difficult     When you attempted sexual intercourse, how often was it satisfactory for you?  Almost Always or Always       SHIM Total Score   SHIM  20        Score: 1-7 Severe ED 8-11 Moderate ED 12-16 Mild-Moderate ED 17-21 Mild ED 22-25 No ED  Patient is working on getting his DM improved and is trying to lose weight and exercise more.  Hip pain has continued; patient has tried physical therapy.  PMH: Past Medical History:  Diagnosis Date  . BPH (benign prostatic hypertrophy)   . Bronchitis   . Chronic prostatitis   . Diabetes mellitus without complication ([place])   . Flank pain   . Frequency   . Gross hematuria   . HTN (hypertension)   . Inguinal hernia   . Kidney stones    stones  . Nocturia   . Overweight   . Spermatocele   . Stricture of urethra     Surgical History: Past Surgical History:  Procedure Laterality Date  . COLONOSCOPY WITH  PROPOFOL N/A 11/20/2015   Procedure: COLONOSCOPY WITH PROPOFOL;  Surgeon: Christene Lye, MD;  Location: [facility] ENDOSCOPY;  Service: Endoscopy;  Laterality: N/A;  . HERNIA REPAIR Bilateral [id number]   umbilical and bil inguinal/ Dr Marina Gravel  . JOINT REPLACEMENT     knee  . KIDNEY STONE SURGERY    . open lithotomy      Home Medications:  Allergies as of 11/18/2018      Reactions   Penicillins Anaphylaxis   Morphine And Related Nausea And Vomiting   Other       Medication List       Accurate as of November 18, 2018 10:07 AM. Always use your most recent med list.        ciprofloxacin 500 MG tablet Commonly known as:  CIPRO Take 1 tablet (500 mg total) by mouth every 12 (twelve) hours.   fluticasone 50 MCG/ACT nasal spray Commonly known as:  FLONASE fluticasone 50 mcg/actuation nasal spray,suspension   ibuprofen 800 MG tablet Commonly known as:  ADVIL,MOTRIN ibuprofen 800 mg tablet prn   lisinopril 5 MG tablet Commonly known as:  PRINIVIL,ZESTRIL lisinopril 5 mg tablet   meloxicam 15 MG tablet Commonly known as:  MOBIC meloxicam 15 mg tablet daily   metFORMIN 500 MG tablet Commonly known as:  GLUCOPHAGE Take 500 mg by mouth 2 (two) times daily.   metroNIDAZOLE 500 MG tablet Commonly known as:  FLAGYL Take 1 tablet (500 mg total) by mouth 3 (three) times daily.   multivitamin tablet Take 1 tablet by mouth daily.   PROAIR HFA 108 (90 Base) MCG/ACT inhaler Generic drug:  albuterol ProAir HFA 90 mcg/actuation aerosol inhaler prn   albuterol (2.5 MG/3ML) 0.083% nebulizer solution Commonly known as:  PROVENTIL albuterol sulfate 2.5 mg/3 mL (0.083 %) solution for nebulization   tadalafil 5 MG tablet Commonly known as:  CIALIS Take 1 tablet (5 mg total) by mouth daily as needed for erectile dysfunction.   traMADol 50 MG tablet Commonly known as:  ULTRAM tramadol 50 mg tablet prn       Allergies:  Allergies  Allergen Reactions  . Penicillins Anaphylaxis  .  Morphine And Related Nausea And Vomiting  . Other     Family History: Family History  Problem Relation Age of Onset  . Benign prostatic hyperplasia Father   .  Kidney disease Neg Hx   . Prostate cancer Neg Hx     Social History:  reports that he has quit smoking. His smoking use included cigarettes. He quit after 15.00 years of use. His smokeless tobacco use includes chew. He reports current alcohol use of about 1.0 standard drinks of alcohol per week. He reports that he does not use drugs.  ROS: UROLOGY Frequent Urination?: No Hard to postpone urination?: No Burning/pain with urination?: No Get up at night to urinate?: No Leakage of urine?: No Urine stream starts and stops?: No Trouble starting stream?: No Do you have to strain to urinate?: No Blood in urine?: No Urinary tract infection?: No Sexually transmitted disease?: No Injury to kidneys or bladder?: No Painful intercourse?: No Weak stream?: No Erection problems?: No Penile pain?: No  Gastrointestinal Nausea?: No Vomiting?: No Indigestion/heartburn?: No Diarrhea?: No Constipation?: No  Constitutional Fever: No Night sweats?: No Weight loss?: No Fatigue?: No  Skin Skin rash/lesions?: No Itching?: No  Eyes Blurred vision?: No Double vision?: No  Ears/Nose/Throat Sore throat?: No Sinus problems?: No  Hematologic/Lymphatic Swollen glands?: No Easy bruising?: No  Cardiovascular Leg swelling?: No Chest pain?: No  Respiratory Cough?: No Shortness of breath?: No  Endocrine Excessive thirst?: No  Musculoskeletal Back pain?: No Joint pain?: No  Neurological Headaches?: No Dizziness?: No  Psychologic Depression?: No Anxiety?: No  Physical Exam: BP 139/85   Pulse 83   Ht 5\' 9"  (1.753 m)   Wt 205 lb (93 kg)   BMI 30.27 kg/m   Constitutional:  Well nourished. Alert and oriented, No acute distress. Cardiovascular: No clubbing, cyanosis, or edema. Respiratory: Normal respiratory  effort, no increased work of breathing. GU: No CVA tenderness.  No bladder fullness or masses.  Patient with circumcised phallus.  Urethral meatus is patent.  No penile discharge. No penile lesions or rashes. Scrotum without lesions, cysts, rashes and/or edema; left side hydrocele noted.  Testicles are located scrotally bilaterally. No masses are appreciated in the testicles. Left and right epididymis are normal. Rectal: Patient with  normal sphincter tone. Anus and perineum without scarring or rashes. No rectal masses are appreciated. Prostate is approximately 55 grams, no nodules are appreciated. Seminal vesicles could not be palpated. Skin: No rashes, bruises or suspicious lesions. Neurologic: Grossly intact, no focal deficits, moving all 4 extremities. Psychiatric: Normal mood and affect.  Laboratory Data: Component     Latest Ref Rng & Units 11/18/2016 11/15/2017 11/14/2018  Prostate Specific Ag, Serum     0.0 - 4.0 ng/mL 0.5 0.6 0.2    Lab Results  Component Value Date   WBC 6.9 01/17/2013   HGB 16.2 01/17/2013   HCT 47.7 01/17/2013   MCV 90 01/17/2013   PLT 264 01/17/2013    Lab Results  Component Value Date   CREATININE 0.74 06/30/2018   I have reviewed the labs.  Assessment & Plan:   1. BPH with obstruction/lower urinary tract symptoms - IPSS score is 8/1, it is worsening - Continue conservative management, avoiding bladder irritants and timed voiding's - Continue Cialis 5 mg daily; refills given - RTC in 12 months for IPSS, PSA and exam   2. Erectile dysfunction - SHIM score is 20, it is worsening - Continue Cialis - RTC in 12 months for repeat SHIM score and exam   Return in about 1 year (around 11/19/2019) for IPSS, SHIM, PSA and exam.  These notes generated with voice recognition software. I apologize for typographical errors.  Adele Schilder  [place] [address], [place], [place] [address] [phone]  I,  Adele Schilder, am acting as a Education administrator for [employer], PA-C.   I have reviewed the above documentation for accuracy and completeness, and I agree with the above.    Zara Council, PA-C

## 2018-11-18 ENCOUNTER — Encounter: Payer: Self-pay | Admitting: Urology

## 2018-11-18 ENCOUNTER — Ambulatory Visit (INDEPENDENT_AMBULATORY_CARE_PROVIDER_SITE_OTHER): Payer: BLUE CROSS/BLUE SHIELD | Admitting: Urology

## 2018-11-18 VITALS — BP 139/85 | HR 83 | Ht 69.0 in | Wt 205.0 lb

## 2018-11-18 DIAGNOSIS — N138 Other obstructive and reflux uropathy: Secondary | ICD-10-CM | POA: Diagnosis not present

## 2018-11-18 DIAGNOSIS — N401 Enlarged prostate with lower urinary tract symptoms: Secondary | ICD-10-CM

## 2018-11-18 DIAGNOSIS — N529 Male erectile dysfunction, unspecified: Secondary | ICD-10-CM | POA: Diagnosis not present

## 2018-11-18 MED ORDER — TADALAFIL 5 MG PO TABS: 5.0000 mg | ORAL_TABLET | Freq: Every day | ORAL | 3 refills | Status: DC | PRN

## 2018-11-22 ENCOUNTER — Ambulatory Visit
Admission: RE | Admit: 2018-11-22 | Discharge: 2018-11-22 | Disposition: A | Discharge status: Home / Self Care | Payer: BLUE CROSS/BLUE SHIELD | Attending: Family Medicine | Admitting: Family Medicine

## 2018-11-22 ENCOUNTER — Ambulatory Visit
Admission: RE | Admit: 2018-11-22 | Discharge: 2018-11-22 | Disposition: A | Discharge status: Home / Self Care | Payer: BLUE CROSS/BLUE SHIELD | Source: Ambulatory Visit | Attending: Family Medicine | Admitting: Family Medicine

## 2018-11-22 ENCOUNTER — Other Ambulatory Visit: Payer: Self-pay | Admitting: Family Medicine

## 2018-11-22 DIAGNOSIS — R079 Chest pain, unspecified: Secondary | ICD-10-CM | POA: Insufficient documentation

## 2019-05-09 ENCOUNTER — Ambulatory Visit: Payer: BLUE CROSS/BLUE SHIELD | Admitting: General Surgery

## 2019-05-25 ENCOUNTER — Ambulatory Visit: Payer: BC Managed Care – PPO | Admitting: General Surgery

## 2019-06-01 ENCOUNTER — Encounter: Payer: Self-pay | Admitting: General Surgery

## 2019-11-20 ENCOUNTER — Ambulatory Visit: Payer: BC Managed Care – PPO | Admitting: Urology

## 2019-11-20 ENCOUNTER — Other Ambulatory Visit: Payer: Self-pay

## 2019-11-20 ENCOUNTER — Other Ambulatory Visit
Admission: RE | Admit: 2019-11-20 | Discharge: 2019-11-20 | Disposition: A | Discharge status: Home / Self Care | Payer: Self-pay | Attending: Urology | Admitting: Urology

## 2019-11-20 ENCOUNTER — Encounter: Payer: Self-pay | Admitting: Urology

## 2019-11-20 VITALS — BP 121/79 | HR 96 | Ht 69.0 in | Wt 210.0 lb

## 2019-11-20 DIAGNOSIS — N529 Male erectile dysfunction, unspecified: Secondary | ICD-10-CM

## 2019-11-20 DIAGNOSIS — N401 Enlarged prostate with lower urinary tract symptoms: Secondary | ICD-10-CM

## 2019-11-20 DIAGNOSIS — N138 Other obstructive and reflux uropathy: Secondary | ICD-10-CM

## 2019-11-20 LAB — PSA: Prostatic Specific Antigen: 0.23 ng/mL (ref 0.00–4.00)

## 2019-11-20 MED ORDER — TADALAFIL 5 MG PO TABS: 5.0000 mg | ORAL_TABLET | Freq: Every day | ORAL | 11 refills | Status: DC | PRN

## 2019-11-20 NOTE — Progress Notes (Signed)
11/18/2018 9:08 AM   Shawn Atkins January 01, 1956 [id number]  Referring provider: Lynnell Jude, MD [address] [place],  [place] [id number]  Chief Complaint  Patient presents with  . Benign Prostatic Hypertrophy    1year    HPI: Shawn Atkins is a 64 y.o. male with BPH with LU TS and ED who presents for annual follow up.  BPH WITH LUTS  (prostate and/or bladder) IPSS score: 12/1  Previous score: 8/1  Major complaint(s):  None.  Patient denies any modifying or aggravating factors. Patient denies any gross hematuria, dysuria or suprapubic/flank pain.  Patient denies any fevers, chills, nausea or vomiting.   He is currently taking Cialis 5 mg daily.  He does not have a family history of PCa.  IPSS    Row Name 11/20/19 0900         International Prostate Symptom Score   How often have you had the sensation of not emptying your bladder?  Less than 1 in 5     How often have you had to urinate less than every two hours?  Less than half the time     How often have you found you stopped and started again several times when you urinated?  Less than half the time     How often have you found it difficult to postpone urination?  About half the time     How often have you had a weak urinary stream?  Less than 1 in 5 times     How often have you had to strain to start urination?  Less than 1 in 5 times     How many times did you typically get up at night to urinate?  2 Times     Total IPSS Score  12       Quality of Life due to urinary symptoms   If you were to spend the rest of your life with your urinary condition just the way it is now how would you feel about that?  Pleased        Score:  1-7 Mild 8-19 Moderate 20-35 Severe  Erectile dysfunction His SHIM score is 22, which is no ED.  His previous SHIM score was 20.  He has been having difficulty with erections for several years.   His major complaint is getting and maintaining an erection.   His risk factors for ED are age,  BPH, and DM.  He denies any painful erections or curvatures with his erections.  He is still having an occasional spontaneous erection.  He has tried Cialis in the past with good results.  SHIM    Row Name 11/20/19 0901         SHIM: Over the last 6 months:   How do you rate your confidence that you could get and keep an erection?  Moderate     When you had erections with sexual stimulation, how often were your erections hard enough for penetration (entering your partner)?  Almost Always or Always     During sexual intercourse, how often were you able to maintain your erection after you had penetrated (entered) your partner?  Almost Always or Always     During sexual intercourse, how difficult was it to maintain your erection to completion of intercourse?  Slightly Difficult     When you attempted sexual intercourse, how often was it satisfactory for you?  Almost Always or Always       SHIM Total  Score   SHIM  22        Score: 1-7 Severe ED 8-11 Moderate ED 12-16 Mild-Moderate ED 17-21 Mild ED 22-25 No ED    PMH: Past Medical History:  Diagnosis Date  . BPH (benign prostatic hypertrophy)   . Bronchitis   . Chronic prostatitis   . Diabetes mellitus without complication ([place])   . Flank pain   . Frequency   . Gross hematuria   . HTN (hypertension)   . Inguinal hernia   . Kidney stones    stones  . Nocturia   . Overweight   . Spermatocele   . Stricture of urethra     Surgical History: Past Surgical History:  Procedure Laterality Date  . COLONOSCOPY WITH PROPOFOL N/A 11/20/2015   Procedure: COLONOSCOPY WITH PROPOFOL;  Surgeon: Christene Lye, MD;  Location: [facility] ENDOSCOPY;  Service: Endoscopy;  Laterality: N/A;  . HERNIA REPAIR Bilateral [id number]   umbilical and bil inguinal/ Dr Marina Gravel  . JOINT REPLACEMENT     knee  . KIDNEY STONE SURGERY    . open lithotomy      Home Medications:  Allergies as of 11/20/2019      Reactions   Penicillins Anaphylaxis    Morphine And Related Nausea And Vomiting   Other       Medication List       Accurate as of November 20, 2019  9:08 AM. If you have any questions, ask your nurse or doctor.        STOP taking these medications   meloxicam 15 MG tablet Commonly known as: MOBIC Stopped by: Zara Council, PA-C     TAKE these medications   ciprofloxacin 500 MG tablet Commonly known as: CIPRO Take 1 tablet (500 mg total) by mouth every 12 (twelve) hours.   fluticasone 50 MCG/ACT nasal spray Commonly known as: FLONASE fluticasone 50 mcg/actuation nasal spray,suspension   ibuprofen 800 MG tablet Commonly known as: ADVIL ibuprofen 800 mg tablet prn   lisinopril 5 MG tablet Commonly known as: ZESTRIL lisinopril 5 mg tablet   metFORMIN 500 MG tablet Commonly known as: GLUCOPHAGE Take 500 mg by mouth 2 (two) times daily.   metroNIDAZOLE 500 MG tablet Commonly known as: FLAGYL Take 1 tablet (500 mg total) by mouth 3 (three) times daily.   multivitamin tablet Take 1 tablet by mouth daily.   ProAir HFA 108 (90 Base) MCG/ACT inhaler Generic drug: albuterol ProAir HFA 90 mcg/actuation aerosol inhaler prn   albuterol (2.5 MG/3ML) 0.083% nebulizer solution Commonly known as: PROVENTIL albuterol sulfate 2.5 mg/3 mL (0.083 %) solution for nebulization   tadalafil 5 MG tablet Commonly known as: CIALIS Take 1 tablet (5 mg total) by mouth daily as needed for erectile dysfunction.   traMADol 50 MG tablet Commonly known as: ULTRAM tramadol 50 mg tablet prn       Allergies:  Allergies  Allergen Reactions  . Penicillins Anaphylaxis  . Morphine And Related Nausea And Vomiting  . Other     Family History: Family History  Problem Relation Age of Onset  . Benign prostatic hyperplasia Father   . Kidney disease Neg Hx   . Prostate cancer Neg Hx     Social History:  reports that he has quit smoking. His smoking use included cigarettes. He quit after 15.00 years of use. His smokeless  tobacco use includes chew. He reports current alcohol use of about 1.0 standard drinks of alcohol per week. He reports that he does not use  drugs.  ROS: UROLOGY Frequent Urination?: No Hard to postpone urination?: No Burning/pain with urination?: No Get up at night to urinate?: No Leakage of urine?: No Urine stream starts and stops?: No Trouble starting stream?: No Do you have to strain to urinate?: No Blood in urine?: No Urinary tract infection?: No Sexually transmitted disease?: No Injury to kidneys or bladder?: No Painful intercourse?: No Weak stream?: No Erection problems?: No Penile pain?: No  Gastrointestinal Nausea?: No Vomiting?: No Indigestion/heartburn?: No Diarrhea?: No Constipation?: No  Constitutional Fever: No Night sweats?: No Weight loss?: No Fatigue?: No  Skin Skin rash/lesions?: No Itching?: No  Eyes Blurred vision?: No Double vision?: No  Ears/Nose/Throat Sore throat?: No Sinus problems?: No  Hematologic/Lymphatic Swollen glands?: No Easy bruising?: No  Cardiovascular Leg swelling?: No Chest pain?: No  Respiratory Cough?: No Shortness of breath?: No  Endocrine Excessive thirst?: No  Musculoskeletal Back pain?: No Joint pain?: No  Neurological Headaches?: No Dizziness?: No  Psychologic Depression?: No Anxiety?: No  Physical Exam: BP 121/79   Pulse 96   Ht 5\' 9"  (1.753 m)   Wt 210 lb (95.3 kg)   BMI 31.01 kg/m   Constitutional:  Well nourished. Alert and oriented, No acute distress. HEENT: [place] AT, mask in place.  Trachea midline, no masses. Cardiovascular: No clubbing, cyanosis, or edema. Respiratory: Normal respiratory effort, no increased work of breathing. GI: Abdomen is soft, non tender, non distended, no abdominal masses. Liver and spleen not palpable.  No hernias appreciated.  Stool sample for occult testing is not indicated.   GU: No CVA tenderness.  No bladder fullness or masses.  Patient with circumcised  phallus.  Urethral meatus is patent.  No penile discharge. No penile lesions or rashes. Scrotum without lesions, cysts, rashes and/or edema.  Testicles are located scrotally bilaterally. No masses are appreciated in the testicles. Left and right epididymis are normal. Rectal: Patient with  normal sphincter tone. Anus and perineum without scarring or rashes. No rectal masses are appreciated. Prostate is approximately 55 grams, no nodules are appreciated. Seminal vesicles are normal. Skin: No rashes, bruises or suspicious lesions. Lymph: No inguinal adenopathy. Neurologic: Grossly intact, no focal deficits, moving all 4 extremities. Psychiatric: Normal mood and affect.  Laboratory Data: Component     Latest Ref Rng & Units 11/18/2016 11/15/2017 11/14/2018  Prostate Specific Ag, Serum     0.0 - 4.0 ng/mL 0.5 0.6 0.2    Lab Results  Component Value Date   WBC 6.9 01/17/2013   HGB 16.2 01/17/2013   HCT 47.7 01/17/2013   MCV 90 01/17/2013   PLT 264 01/17/2013    Lab Results  Component Value Date   CREATININE 0.74 06/30/2018   I have reviewed the labs.  Assessment & Plan:   1. BPH with obstruction/lower urinary tract symptoms - IPSS score is 12/1, it is worsening - Continue conservative management, avoiding bladder irritants and timed voiding's - Continue Cialis 5 mg daily; refills given - PSA pending  - RTC in 12 months for IPSS, PSA and exam - if PSA is stable   2. Erectile dysfunction - SHIM score is 22, it is improved  - Continue Cialis - RTC in 12 months for repeat SHIM score and exam   Return in about 1 year (around 11/19/2020) for IPSS, SHIM, PSA and exam.  These notes generated with voice recognition software. I apologize for typographical errors.  Zara Council, PA-C  [facility] [address] [place], [place] [address] [phone]   

## 2019-11-20 NOTE — Addendum Note (Signed)
Addended by: Tommy Rainwater on: 11/20/2019 09:21 AM   Modules accepted: Orders

## 2020-07-15 ENCOUNTER — Other Ambulatory Visit: Payer: Self-pay

## 2020-07-15 ENCOUNTER — Encounter: Payer: Self-pay | Admitting: Ophthalmology

## 2020-07-18 ENCOUNTER — Other Ambulatory Visit
Admission: RE | Admit: 2020-07-18 | Discharge: 2020-07-18 | Disposition: A | Discharge status: Home / Self Care | Payer: Medicare Other | Source: Ambulatory Visit | Attending: Ophthalmology | Admitting: Ophthalmology

## 2020-07-18 ENCOUNTER — Other Ambulatory Visit: Payer: Self-pay

## 2020-07-18 DIAGNOSIS — Z20822 Contact with and (suspected) exposure to covid-19: Secondary | ICD-10-CM | POA: Diagnosis not present

## 2020-07-18 DIAGNOSIS — Z01812 Encounter for preprocedural laboratory examination: Secondary | ICD-10-CM | POA: Diagnosis present

## 2020-07-18 LAB — SARS CORONAVIRUS 2 (TAT 6-24 HRS): SARS Coronavirus 2: NEGATIVE

## 2020-07-18 NOTE — Discharge Instructions (Signed)

## 2020-07-21 NOTE — Anesthesia Preprocedure Evaluation (Addendum)
Anesthesia Evaluation  Patient identified by MRN, date of birth, ID band Patient awake    Reviewed: Allergy & Precautions, NPO status , Patient's Chart, lab work & pertinent test results  History of Anesthesia Complications Negative for: history of anesthetic complications  Airway Mallampati: IV   Neck ROM: Full    Dental  (+)    Pulmonary former smoker (quit 1985),    Pulmonary exam normal breath sounds clear to auscultation       Cardiovascular hypertension, Normal cardiovascular exam Rhythm:Regular Rate:Normal     Neuro/Psych Vertigo     GI/Hepatic negative GI ROS,   Endo/Other  diabetes, Type 2  Renal/GU Renal disease (nephrolithiasis)     Musculoskeletal   Abdominal   Peds  Hematology negative hematology ROS (+)   Anesthesia Other Findings BPH  Reproductive/Obstetrics                            Anesthesia Physical Anesthesia Plan  ASA: III  Anesthesia Plan: MAC   Post-op Pain Management:    Induction: Intravenous  PONV Risk Score and Plan: 1 and TIVA, Midazolam and Treatment may vary due to age or medical condition  Airway Management Planned: Nasal Cannula  Additional Equipment:   Intra-op Plan:   Post-operative Plan:   Informed Consent: I have reviewed the patients History and Physical, chart, labs and discussed the procedure including the risks, benefits and alternatives for the proposed anesthesia with the patient or authorized representative who has indicated his/her understanding and acceptance.       Plan Discussed with: CRNA  Anesthesia Plan Comments:        Anesthesia Quick Evaluation

## 2020-07-22 ENCOUNTER — Other Ambulatory Visit: Payer: Self-pay

## 2020-07-22 ENCOUNTER — Ambulatory Visit: Payer: Medicare Other | Admitting: Anesthesiology

## 2020-07-22 ENCOUNTER — Encounter
Admission: RE | Disposition: A | Discharge status: Home / Self Care | Payer: Self-pay | Source: Home / Self Care | Attending: Ophthalmology

## 2020-07-22 ENCOUNTER — Encounter: Payer: Self-pay | Admitting: Ophthalmology

## 2020-07-22 ENCOUNTER — Ambulatory Visit
Admission: RE | Admit: 2020-07-22 | Discharge: 2020-07-22 | Disposition: A | Discharge status: Home / Self Care | Payer: Medicare Other | Attending: Ophthalmology | Admitting: Ophthalmology

## 2020-07-22 DIAGNOSIS — E1136 Type 2 diabetes mellitus with diabetic cataract: Secondary | ICD-10-CM | POA: Insufficient documentation

## 2020-07-22 DIAGNOSIS — H2512 Age-related nuclear cataract, left eye: Secondary | ICD-10-CM | POA: Insufficient documentation

## 2020-07-22 DIAGNOSIS — Z7984 Long term (current) use of oral hypoglycemic drugs: Secondary | ICD-10-CM | POA: Insufficient documentation

## 2020-07-22 DIAGNOSIS — Z87891 Personal history of nicotine dependence: Secondary | ICD-10-CM | POA: Diagnosis not present

## 2020-07-22 DIAGNOSIS — I10 Essential (primary) hypertension: Secondary | ICD-10-CM | POA: Insufficient documentation

## 2020-07-22 DIAGNOSIS — Z79899 Other long term (current) drug therapy: Secondary | ICD-10-CM | POA: Diagnosis not present

## 2020-07-22 DIAGNOSIS — E7800 Pure hypercholesterolemia, unspecified: Secondary | ICD-10-CM | POA: Insufficient documentation

## 2020-07-22 HISTORY — DX: Other complications of anesthesia, initial encounter: T88.59XA

## 2020-07-22 HISTORY — DX: Dizziness and giddiness: R42

## 2020-07-22 HISTORY — PX: CATARACT EXTRACTION W/PHACO: SHX586

## 2020-07-22 LAB — GLUCOSE, CAPILLARY
Glucose-Capillary: 180 mg/dL — ABNORMAL HIGH (ref 70–99)
Glucose-Capillary: 213 mg/dL — ABNORMAL HIGH (ref 70–99)

## 2020-07-22 SURGERY — PHACOEMULSIFICATION, CATARACT, WITH IOL INSERTION
Anesthesia: Monitor Anesthesia Care | Site: Eye | Laterality: Left

## 2020-07-22 MED ORDER — ACETAMINOPHEN 160 MG/5ML PO SOLN: 325.0000 mg | ORAL | Status: DC | PRN

## 2020-07-22 MED ORDER — ONDANSETRON HCL 4 MG/2ML IJ SOLN: 4.0000 mg | Freq: Once | INTRAMUSCULAR | Status: DC | PRN

## 2020-07-22 MED ORDER — LACTATED RINGERS IV SOLN: INTRAVENOUS | Status: DC

## 2020-07-22 MED ORDER — MIDAZOLAM HCL 2 MG/2ML IJ SOLN
INTRAMUSCULAR | Status: DC | PRN
  Administered 2020-07-22: 1 mg via INTRAVENOUS

## 2020-07-22 MED ORDER — SODIUM HYALURONATE 10 MG/ML IO SOLN
INTRAOCULAR | Status: DC | PRN
  Administered 2020-07-22: 0.55 mL via INTRAOCULAR

## 2020-07-22 MED ORDER — EPINEPHRINE PF 1 MG/ML IJ SOLN
INTRAOCULAR | Status: DC | PRN
  Administered 2020-07-22: 72 mL via OPHTHALMIC

## 2020-07-22 MED ORDER — SODIUM HYALURONATE 23 MG/ML IO SOLN
INTRAOCULAR | Status: DC | PRN
  Administered 2020-07-22: 0.6 mL via INTRAOCULAR

## 2020-07-22 MED ORDER — LIDOCAINE HCL (PF) 2 % IJ SOLN
INTRAOCULAR | Status: DC | PRN
  Administered 2020-07-22: 1 mL via INTRAOCULAR

## 2020-07-22 MED ORDER — ARMC OPHTHALMIC DILATING DROPS
1.0000 "application " | OPHTHALMIC | Status: DC | PRN
  Administered 2020-07-22 (×3): 1 via OPHTHALMIC

## 2020-07-22 MED ORDER — FENTANYL CITRATE (PF) 100 MCG/2ML IJ SOLN
INTRAMUSCULAR | Status: DC | PRN
  Administered 2020-07-22: 50 ug via INTRAVENOUS

## 2020-07-22 MED ORDER — TETRACAINE HCL 0.5 % OP SOLN
1.0000 [drp] | OPHTHALMIC | Status: DC | PRN
  Administered 2020-07-22 (×3): 1 [drp] via OPHTHALMIC

## 2020-07-22 MED ORDER — ACETAMINOPHEN 325 MG PO TABS: 650.0000 mg | ORAL_TABLET | Freq: Once | ORAL | Status: DC | PRN

## 2020-07-22 MED ORDER — MOXIFLOXACIN HCL 0.5 % OP SOLN
OPHTHALMIC | Status: DC | PRN
  Administered 2020-07-22: 0.2 mL via OPHTHALMIC

## 2020-07-22 SURGICAL SUPPLY — 17 items
CANNULA ANT/CHMB 27GA (MISCELLANEOUS) ×6 IMPLANT
DISSECTOR HYDRO NUCLEUS 50X22 (MISCELLANEOUS) ×3 IMPLANT
GLOVE SURG LX 7.5 STRW (GLOVE) ×2
GLOVE SURG LX STRL 7.5 STRW (GLOVE) ×1 IMPLANT
GLOVE SURG SYN 8.5  E (GLOVE) ×2
GLOVE SURG SYN 8.5 E (GLOVE) ×1 IMPLANT
GOWN STRL REUS W/ TWL LRG LVL3 (GOWN DISPOSABLE) ×2 IMPLANT
GOWN STRL REUS W/TWL LRG LVL3 (GOWN DISPOSABLE) ×6
LENS IOL TECNIS EYHANCE 10.5 ×3 IMPLANT
MARKER SKIN DUAL TIP RULER LAB (MISCELLANEOUS) ×3 IMPLANT
PACK DR. KING ARMS (PACKS) ×3 IMPLANT
PACK EYE AFTER SURG (MISCELLANEOUS) ×3 IMPLANT
PACK OPTHALMIC (MISCELLANEOUS) ×3 IMPLANT
SYR 3ML LL SCALE MARK (SYRINGE) ×3 IMPLANT
SYR TB 1ML LUER SLIP (SYRINGE) ×3 IMPLANT
WATER STERILE IRR 250ML POUR (IV SOLUTION) ×3 IMPLANT
WIPE NON LINTING 3.25X3.25 (MISCELLANEOUS) ×3 IMPLANT

## 2020-07-22 NOTE — Anesthesia Postprocedure Evaluation (Signed)
Anesthesia Post Note  Patient: Shawn Atkins  Procedure(s) Performed: CATARACT EXTRACTION PHACO AND INTRAOCULAR LENS PLACEMENT (IOC) LEFT DIABETIC 4.14  00:33.0 (Left Eye)     Patient location during evaluation: PACU Anesthesia Type: MAC Level of consciousness: awake and alert, oriented and patient cooperative Pain management: pain level controlled Vital Signs Assessment: post-procedure vital signs reviewed and stable Respiratory status: spontaneous breathing, nonlabored ventilation and respiratory function stable Cardiovascular status: blood pressure returned to baseline and stable Postop Assessment: adequate PO intake Anesthetic complications: no   No complications documented.  Darrin Nipper

## 2020-07-22 NOTE — Anesthesia Procedure Notes (Signed)
Procedure Name: MAC Date/Time: 07/22/2020 10:28 AM Performed by: Cameron Ali, CRNA Pre-anesthesia Checklist: Patient identified, Emergency Drugs available, Suction available, Timeout performed and Patient being monitored Patient Re-evaluated:Patient Re-evaluated prior to induction Oxygen Delivery Method: Nasal cannula Placement Confirmation: positive ETCO2

## 2020-07-22 NOTE — H&P (Signed)

## 2020-07-22 NOTE — Op Note (Signed)
OPERATIVE NOTE  Shawn Atkins [id number] 07/22/2020   PREOPERATIVE DIAGNOSIS:  Nuclear sclerotic cataract left eye.  H25.12   POSTOPERATIVE DIAGNOSIS:    Nuclear sclerotic cataract left eye.     PROCEDURE:  Phacoemusification with posterior chamber intraocular lens placement of the left eye   LENS:   Implant Name Type Inv. Item Serial No. Manufacturer Lot No. LRB No. Used Action  LENS II EYHANCE 10.5 - [id number]  LENS II EYHANCE 10.5 [id number] JOHNSON   Left 1 Implanted      Procedure(s) with comments: CATARACT EXTRACTION PHACO AND INTRAOCULAR LENS PLACEMENT (IOC) LEFT DIABETIC 4.14  00:33.0 (Left) - Diabetic - oral meds  DIB00 +10.5   ULTRASOUND TIME: 0 minutes 33 seconds.  CDE 4.14   SURGEON:  Benay Pillow, MD, MPH   ANESTHESIA:  Topical with tetracaine drops augmented with 1% preservative-free intracameral lidocaine.  ESTIMATED BLOOD LOSS: <1 mL   COMPLICATIONS:  None.   DESCRIPTION OF PROCEDURE:  The patient was identified in the holding room and transported to the operating room and placed in the supine position under the operating microscope.  The left eye was identified as the operative eye and it was prepped and draped in the usual sterile ophthalmic fashion.   A 1.0 millimeter clear-corneal paracentesis was made at the 5:00 position. 0.5 ml of preservative-free 1% lidocaine with epinephrine was injected into the anterior chamber.  The anterior chamber was filled with Healon 5 viscoelastic.  A 2.4 millimeter keratome was used to make a near-clear corneal incision at the 2:00 position.  A curvilinear capsulorrhexis was made with a cystotome and capsulorrhexis forceps.  Balanced salt solution was used to hydrodissect and hydrodelineate the nucleus.   Phacoemulsification was then used in stop and chop fashion to remove the lens nucleus and epinucleus.  The remaining cortex was then removed using the irrigation and aspiration handpiece. Healon was then placed into the  capsular bag to distend it for lens placement.  A lens was then injected into the capsular bag.  The remaining viscoelastic was aspirated.   Wounds were hydrated with balanced salt solution.  The anterior chamber was inflated to a physiologic pressure with balanced salt solution.  Intracameral vigamox 0.1 mL undiltued was injected into the eye and a drop placed onto the ocular surface.  No wound leaks were noted.  The patient was taken to the recovery room in stable condition without complications of anesthesia or surgery  Benay Pillow 07/22/2020, 10:55 AM

## 2020-07-22 NOTE — Transfer of Care (Signed)
Immediate Anesthesia Transfer of Care Note  Patient: Shawn Atkins  Procedure(s) Performed: CATARACT EXTRACTION PHACO AND INTRAOCULAR LENS PLACEMENT (IOC) LEFT DIABETIC 4.14  00:33.0 (Left Eye)  Patient Location: PACU  Anesthesia Type: MAC  Level of Consciousness: awake, alert  and patient cooperative  Airway and Oxygen Therapy: Patient Spontanous Breathing and Patient connected to supplemental oxygen  Post-op Assessment: Post-op Vital signs reviewed, Patient's Cardiovascular Status Stable, Respiratory Function Stable, Patent Airway and No signs of Nausea or vomiting  Post-op Vital Signs: Reviewed and stable  Complications: No complications documented.

## 2020-07-23 ENCOUNTER — Encounter: Payer: Self-pay | Admitting: Ophthalmology

## 2020-08-01 ENCOUNTER — Encounter: Payer: Self-pay | Admitting: Ophthalmology

## 2020-08-07 NOTE — Anesthesia Preprocedure Evaluation (Addendum)
Anesthesia Evaluation  Patient identified by MRN, date of birth, ID band Patient awake    Reviewed: Allergy & Precautions, NPO status , Patient's Chart, lab work & pertinent test results, reviewed documented beta blocker date and time   History of Anesthesia Complications Negative for: history of anesthetic complications  Airway Mallampati: III   Neck ROM: Full    Dental  (+)    Pulmonary former smoker,    Pulmonary exam normal breath sounds clear to auscultation       Cardiovascular hypertension, (-) angina(-) DOE Normal cardiovascular exam Rhythm:Regular Rate:Normal     Neuro/Psych Vertigo     GI/Hepatic neg GERD  ,  Endo/Other  diabetes, Type 2  Renal/GU Renal disease (nephrolithiasis)     Musculoskeletal   Abdominal (+) + obese (BMI 30),   Peds  Hematology   Anesthesia Other Findings BPH  Reproductive/Obstetrics                            Anesthesia Physical  Anesthesia Plan  ASA: II  Anesthesia Plan: MAC   Post-op Pain Management:    Induction: Intravenous  PONV Risk Score and Plan: 1 and TIVA, Midazolam and Treatment may vary due to age or medical condition  Airway Management Planned: Nasal Cannula  Additional Equipment:   Intra-op Plan:   Post-operative Plan:   Informed Consent: I have reviewed the patients History and Physical, chart, labs and discussed the procedure including the risks, benefits and alternatives for the proposed anesthesia with the patient or authorized representative who has indicated his/her understanding and acceptance.       Plan Discussed with: CRNA  Anesthesia Plan Comments:         Anesthesia Quick Evaluation

## 2020-08-08 ENCOUNTER — Other Ambulatory Visit
Admission: RE | Admit: 2020-08-08 | Discharge: 2020-08-08 | Disposition: A | Discharge status: Home / Self Care | Payer: Medicare Other | Source: Ambulatory Visit | Attending: Ophthalmology | Admitting: Ophthalmology

## 2020-08-08 DIAGNOSIS — Z01812 Encounter for preprocedural laboratory examination: Secondary | ICD-10-CM | POA: Insufficient documentation

## 2020-08-08 DIAGNOSIS — Z20822 Contact with and (suspected) exposure to covid-19: Secondary | ICD-10-CM | POA: Diagnosis not present

## 2020-08-08 LAB — SARS CORONAVIRUS 2 (TAT 6-24 HRS): SARS Coronavirus 2: NEGATIVE

## 2020-08-08 NOTE — Discharge Instructions (Signed)

## 2020-08-12 ENCOUNTER — Ambulatory Visit: Payer: Medicare Other | Admitting: Anesthesiology

## 2020-08-12 ENCOUNTER — Encounter: Payer: Self-pay | Admitting: Ophthalmology

## 2020-08-12 ENCOUNTER — Other Ambulatory Visit: Payer: Self-pay

## 2020-08-12 ENCOUNTER — Ambulatory Visit
Admission: RE | Admit: 2020-08-12 | Discharge: 2020-08-12 | Disposition: A | Discharge status: Home / Self Care | Payer: Medicare Other | Attending: Ophthalmology | Admitting: Ophthalmology

## 2020-08-12 ENCOUNTER — Encounter
Admission: RE | Disposition: A | Discharge status: Home / Self Care | Payer: Self-pay | Source: Home / Self Care | Attending: Ophthalmology

## 2020-08-12 DIAGNOSIS — E1136 Type 2 diabetes mellitus with diabetic cataract: Secondary | ICD-10-CM | POA: Diagnosis not present

## 2020-08-12 DIAGNOSIS — Z6830 Body mass index (BMI) 30.0-30.9, adult: Secondary | ICD-10-CM | POA: Diagnosis not present

## 2020-08-12 DIAGNOSIS — Z9842 Cataract extraction status, left eye: Secondary | ICD-10-CM | POA: Diagnosis not present

## 2020-08-12 DIAGNOSIS — R42 Dizziness and giddiness: Secondary | ICD-10-CM | POA: Diagnosis not present

## 2020-08-12 DIAGNOSIS — Z7984 Long term (current) use of oral hypoglycemic drugs: Secondary | ICD-10-CM | POA: Diagnosis not present

## 2020-08-12 DIAGNOSIS — E7800 Pure hypercholesterolemia, unspecified: Secondary | ICD-10-CM | POA: Diagnosis not present

## 2020-08-12 DIAGNOSIS — Z880 Allergy status to penicillin: Secondary | ICD-10-CM | POA: Diagnosis not present

## 2020-08-12 DIAGNOSIS — Z885 Allergy status to narcotic agent status: Secondary | ICD-10-CM | POA: Diagnosis not present

## 2020-08-12 DIAGNOSIS — Z87891 Personal history of nicotine dependence: Secondary | ICD-10-CM | POA: Diagnosis not present

## 2020-08-12 DIAGNOSIS — E669 Obesity, unspecified: Secondary | ICD-10-CM | POA: Diagnosis not present

## 2020-08-12 DIAGNOSIS — H2511 Age-related nuclear cataract, right eye: Secondary | ICD-10-CM | POA: Diagnosis present

## 2020-08-12 DIAGNOSIS — I10 Essential (primary) hypertension: Secondary | ICD-10-CM | POA: Insufficient documentation

## 2020-08-12 DIAGNOSIS — Z87442 Personal history of urinary calculi: Secondary | ICD-10-CM | POA: Diagnosis not present

## 2020-08-12 HISTORY — PX: CATARACT EXTRACTION W/PHACO: SHX586

## 2020-08-12 LAB — GLUCOSE, CAPILLARY
Glucose-Capillary: 224 mg/dL — ABNORMAL HIGH (ref 70–99)
Glucose-Capillary: 188 mg/dL — ABNORMAL HIGH (ref 70–99)

## 2020-08-12 SURGERY — PHACOEMULSIFICATION, CATARACT, WITH IOL INSERTION
Anesthesia: Monitor Anesthesia Care | Site: Eye | Laterality: Right

## 2020-08-12 MED ORDER — MIDAZOLAM HCL 2 MG/2ML IJ SOLN
INTRAMUSCULAR | Status: DC | PRN
  Administered 2020-08-12: 2 mg via INTRAVENOUS

## 2020-08-12 MED ORDER — TETRACAINE HCL 0.5 % OP SOLN
1.0000 [drp] | OPHTHALMIC | Status: DC | PRN
  Administered 2020-08-12 (×3): 1 [drp] via OPHTHALMIC

## 2020-08-12 MED ORDER — EPINEPHRINE PF 1 MG/ML IJ SOLN
INTRAOCULAR | Status: DC | PRN
  Administered 2020-08-12: 69 mL via OPHTHALMIC

## 2020-08-12 MED ORDER — SODIUM HYALURONATE 23 MG/ML IO SOLN
INTRAOCULAR | Status: DC | PRN
  Administered 2020-08-12: 0.6 mL via INTRAOCULAR

## 2020-08-12 MED ORDER — ONDANSETRON HCL 4 MG/2ML IJ SOLN: 4.0000 mg | Freq: Once | INTRAMUSCULAR | Status: DC | PRN

## 2020-08-12 MED ORDER — SODIUM HYALURONATE 10 MG/ML IO SOLN
INTRAOCULAR | Status: DC | PRN
  Administered 2020-08-12: 0.55 mL via INTRAOCULAR

## 2020-08-12 MED ORDER — MOXIFLOXACIN HCL 0.5 % OP SOLN
OPHTHALMIC | Status: DC | PRN
  Administered 2020-08-12: 0.2 mL via OPHTHALMIC

## 2020-08-12 MED ORDER — INSULIN LISPRO 100 UNIT/ML ~~LOC~~ SOLN
2.0000 [IU] | Freq: Once | SUBCUTANEOUS | Status: AC
Start: 2020-08-12 — End: 2020-08-12
  Administered 2020-08-12: 09:00:00 2 [IU] via SUBCUTANEOUS

## 2020-08-12 MED ORDER — LIDOCAINE HCL (PF) 2 % IJ SOLN
INTRAOCULAR | Status: DC | PRN
  Administered 2020-08-12: 1 mL via INTRAOCULAR

## 2020-08-12 MED ORDER — LACTATED RINGERS IV SOLN: INTRAVENOUS | Status: DC

## 2020-08-12 MED ORDER — FENTANYL CITRATE (PF) 100 MCG/2ML IJ SOLN
INTRAMUSCULAR | Status: DC | PRN
Start: 2020-08-12 — End: 2020-08-12
  Administered 2020-08-12 (×2): 50 ug via INTRAVENOUS

## 2020-08-12 MED ORDER — ARMC OPHTHALMIC DILATING DROPS
1.0000 "application " | OPHTHALMIC | Status: DC | PRN
  Administered 2020-08-12 (×3): 1 via OPHTHALMIC

## 2020-08-12 MED ORDER — ACETAMINOPHEN 10 MG/ML IV SOLN: 1000.0000 mg | Freq: Once | INTRAVENOUS | Status: DC | PRN

## 2020-08-12 SURGICAL SUPPLY — 19 items
CANNULA ANT/CHMB 27G (MISCELLANEOUS) ×2 IMPLANT
CANNULA ANT/CHMB 27GA (MISCELLANEOUS) ×6 IMPLANT
DISSECTOR HYDRO NUCLEUS 50X22 (MISCELLANEOUS) ×3 IMPLANT
GLOVE SURG LX 7.5 STRW (GLOVE) ×4
GLOVE SURG LX STRL 7.5 STRW (GLOVE) ×1 IMPLANT
GLOVE SURG SYN 8.5  E (GLOVE) ×2
GLOVE SURG SYN 8.5 E (GLOVE) ×1 IMPLANT
GLOVE SURG SYN 8.5 PF PI (GLOVE) ×1 IMPLANT
GOWN STRL REUS W/ TWL LRG LVL3 (GOWN DISPOSABLE) ×2 IMPLANT
GOWN STRL REUS W/TWL LRG LVL3 (GOWN DISPOSABLE) ×6
LENS IOL TECNIS EYHANCE 8.0 (Intraocular Lens) ×2 IMPLANT
MARKER SKIN DUAL TIP RULER LAB (MISCELLANEOUS) ×3 IMPLANT
PACK DR. KING ARMS (PACKS) ×3 IMPLANT
PACK EYE AFTER SURG (MISCELLANEOUS) ×3 IMPLANT
PACK OPTHALMIC (MISCELLANEOUS) ×3 IMPLANT
SYR 3ML LL SCALE MARK (SYRINGE) ×3 IMPLANT
SYR TB 1ML LUER SLIP (SYRINGE) ×3 IMPLANT
WATER STERILE IRR 250ML POUR (IV SOLUTION) ×3 IMPLANT
WIPE NON LINTING 3.25X3.25 (MISCELLANEOUS) ×3 IMPLANT

## 2020-08-12 NOTE — Transfer of Care (Signed)
Immediate Anesthesia Transfer of Care Note  Patient: MCADOO MUZQUIZ  Procedure(s) Performed: CATARACT EXTRACTION PHACO AND INTRAOCULAR LENS PLACEMENT (IOC) RIGHT DIABETIC (Right Eye)  Patient Location: PACU  Anesthesia Type: MAC  Level of Consciousness: awake, alert  and patient cooperative  Airway and Oxygen Therapy: Patient Spontanous Breathing and Patient connected to supplemental oxygen  Post-op Assessment: Post-op Vital signs reviewed, Patient's Cardiovascular Status Stable, Respiratory Function Stable, Patent Airway and No signs of Nausea or vomiting  Post-op Vital Signs: Reviewed and stable  Complications: No complications documented.

## 2020-08-12 NOTE — Anesthesia Procedure Notes (Signed)
Procedure Name: MAC Date/Time: 08/12/2020 10:24 AM Performed by: Silvana Newness, CRNA Pre-anesthesia Checklist: Patient identified, Emergency Drugs available, Suction available, Patient being monitored and Timeout performed Patient Re-evaluated:Patient Re-evaluated prior to induction Oxygen Delivery Method: Nasal cannula Placement Confirmation: positive ETCO2

## 2020-08-12 NOTE — Op Note (Signed)
OPERATIVE NOTE  Shawn Atkins [id number] 08/12/2020   PREOPERATIVE DIAGNOSIS:  Nuclear sclerotic cataract right eye.  H25.11   POSTOPERATIVE DIAGNOSIS:    Nuclear sclerotic cataract right eye.     PROCEDURE:  Phacoemusification with posterior chamber intraocular lens placement of the right eye   LENS:   Implant Name Type Inv. Item Serial No. Manufacturer Lot No. LRB No. Used Action  LENS IOL TECNIS EYHANCE 8.0 - [id number] Intraocular Lens LENS IOL TECNIS EYHANCE 8.0 [id number] JOHNSON   Right 1 Implanted       Procedure(s) with comments: CATARACT EXTRACTION PHACO AND INTRAOCULAR LENS PLACEMENT (IOC) RIGHT DIABETIC (Right) - 1.99 0:26.2  DIB00 +8.0   ULTRASOUND TIME: 0 minutes 26 seconds.  CDE 1.99   SURGEON:  Benay Pillow, MD, MPH  ANESTHESIOLOGIST: Anesthesiologist: Heniser, Fredric Dine, MD CRNA: Silvana Newness, CRNA   ANESTHESIA:  Topical with tetracaine drops augmented with 1% preservative-free intracameral lidocaine.  ESTIMATED BLOOD LOSS: less than 1 mL.   COMPLICATIONS:  None.   DESCRIPTION OF PROCEDURE:  The patient was identified in the holding room and transported to the operating room and placed in the supine position under the operating microscope.  The right eye was identified as the operative eye and it was prepped and draped in the usual sterile ophthalmic fashion.   A 1.0 millimeter clear-corneal paracentesis was made at the 10:30 position. 0.5 ml of preservative-free 1% lidocaine with epinephrine was injected into the anterior chamber.  The anterior chamber was filled with Healon 5 viscoelastic.  A 2.4 millimeter keratome was used to make a near-clear corneal incision at the 8:00 position.  A curvilinear capsulorrhexis was made with a cystotome and capsulorrhexis forceps.  Balanced salt solution was used to hydrodissect and hydrodelineate the nucleus.   Phacoemulsification was then used in stop and chop fashion to remove the lens nucleus and epinucleus.  The  remaining cortex was then removed using the irrigation and aspiration handpiece. Healon was then placed into the capsular bag to distend it for lens placement.  A lens was then injected into the capsular bag.  The remaining viscoelastic was aspirated.   Wounds were hydrated with balanced salt solution.  The anterior chamber was inflated to a physiologic pressure with balanced salt solution.   Intracameral vigamox 0.1 mL undiluted was injected into the eye and a drop placed onto the ocular surface.  No wound leaks were noted.  The patient was taken to the recovery room in stable condition without complications of anesthesia or surgery  Benay Pillow 08/12/2020, 10:44 AM

## 2020-08-12 NOTE — Anesthesia Postprocedure Evaluation (Signed)
Anesthesia Post Note  Patient: Shawn Atkins  Procedure(s) Performed: CATARACT EXTRACTION PHACO AND INTRAOCULAR LENS PLACEMENT (IOC) RIGHT DIABETIC (Right Eye)     Patient location during evaluation: PACU Anesthesia Type: MAC Level of consciousness: awake and alert Pain management: pain level controlled Vital Signs Assessment: post-procedure vital signs reviewed and stable Respiratory status: spontaneous breathing, nonlabored ventilation, respiratory function stable and patient connected to nasal cannula oxygen Cardiovascular status: stable and blood pressure returned to baseline Postop Assessment: no apparent nausea or vomiting Anesthetic complications: no   No complications documented.  Franchot Pollitt A  Tudor Chandley

## 2020-08-12 NOTE — H&P (Signed)
[facility]   Primary Care Physician:  Lynnell Jude, MD Ophthalmologist: Dr. Benay Pillow  Pre-Procedure History & Physical: HPI:  Shawn Atkins is a 64 y.o. male here for cataract surgery.   Past Medical History:  Diagnosis Date  . BPH (benign prostatic hypertrophy)   . Bronchitis   . Chronic prostatitis   . Complication of anesthesia    Felt like "couldn't breathe" after triple Hernia surgery  . Diabetes mellitus without complication ([place])   . Flank pain   . Frequency   . Gross hematuria   . HTN (hypertension)   . Inguinal hernia   . Kidney stones    stones  . Nocturia   . Overweight   . Spermatocele   . Stricture of urethra   . Vertigo    1 episode, 6-7 yrs ago    Past Surgical History:  Procedure Laterality Date  . CATARACT EXTRACTION W/PHACO Left 07/22/2020   Procedure: CATARACT EXTRACTION PHACO AND INTRAOCULAR LENS PLACEMENT (IOC) LEFT DIABETIC 4.14  00:33.0;  Surgeon: Eulogio Bear, MD;  Location: [facility];  Service: Ophthalmology;  Laterality: Left;  Diabetic - oral meds  . COLONOSCOPY WITH PROPOFOL N/A 11/20/2015   Procedure: COLONOSCOPY WITH PROPOFOL;  Surgeon: Christene Lye, MD;  Location: [facility] ENDOSCOPY;  Service: Endoscopy;  Laterality: N/A;  . HERNIA REPAIR Bilateral 2482   umbilical and bil inguinal/ Dr Marina Gravel  . JOINT REPLACEMENT     knee  . KIDNEY STONE SURGERY    . open lithotomy      Prior to Admission medications   Medication Sig Start Date End Date Taking? Authorizing Provider  atorvastatin (LIPITOR) 20 MG tablet Take 20 mg by mouth daily.   Yes [provider]  BIOTIN PO Take by mouth daily.   Yes [provider]  lisinopril (PRINIVIL,ZESTRIL) 5 MG tablet lisinopril 5 mg tablet   Yes [provider]  metFORMIN (GLUCOPHAGE) 500 MG tablet Take 500 mg by mouth 2 (two) times daily. 04/02/18  Yes [provider]  Multiple Vitamin (MULTIVITAMIN) tablet Take 1 tablet by mouth daily.    Yes [provider]  tadalafil (CIALIS) 5 MG tablet Take 1 tablet (5 mg total) by mouth daily as needed for erectile dysfunction. 11/20/19  Yes McGowan, Larene Beach A, PA-C  TURMERIC PO Take by mouth daily.   Yes [provider]  albuterol (PROVENTIL) (2.5 MG/3ML) 0.083% nebulizer solution albuterol sulfate 2.5 mg/3 mL (0.083 %) solution for nebulization Patient not taking: albuterol sulfate 2.5 mg/3 mL (0.083 %) solution for nebulization    [provider]  fluticasone (FLONASE) 50 MCG/ACT nasal spray fluticasone 50 mcg/actuation nasal spray,suspension Patient not taking: fluticasone 50 mcg/actuation nasal spray,suspension 10/13/16   [provider]  traMADol (ULTRAM) 50 MG tablet tramadol 50 mg tablet prn Patient not taking: tramadol 50 mg tablet prn 06/25/15   [provider]    Allergies as of 07/24/2020 - Review Complete 07/22/2020  Allergen Reaction Noted  . Penicillins Anaphylaxis 06/14/2015  . Morphine and related Nausea And Vomiting 06/14/2015    Family History  Problem Relation Age of Onset  . Benign prostatic hyperplasia Father   . Kidney disease Neg Hx   . Prostate cancer Neg Hx     Social History   Socioeconomic History  . Marital status: Married    Spouse name: Not on file  . Number of children: Not on file  . Years of education: Not on file  . Highest education  level: Not on file  Occupational History  . Not on file  Tobacco Use  . Smoking status: Former Smoker    Years: 15.00    Types: Cigarettes    Quit date: 1985    Years since quitting: 36.8  . Smokeless tobacco: Current User    Types: Snuff  Vaping Use  . Vaping Use: Never used  Substance and Sexual Activity  . Alcohol use: Yes    Alcohol/week: 1.0 standard drink    Types: 1 Cans of beer per week    Comment: moderate  . Drug use: No  . Sexual activity: Not on file  Other Topics Concern  . Not on file  Social History Narrative  . Not on file   Social  Determinants of Health   Financial Resource Strain:   . Difficulty of Paying Living Expenses: Not on file  Food Insecurity:   . Worried About Charity fundraiser in the Last Year: Not on file  . Ran Out of Food in the Last Year: Not on file  Transportation Needs:   . Lack of Transportation (Medical): Not on file  . Lack of Transportation (Non-Medical): Not on file  Physical Activity:   . Days of Exercise per Week: Not on file  . Minutes of Exercise per Session: Not on file  Stress:   . Feeling of Stress : Not on file  Social Connections:   . Frequency of Communication with Friends and Family: Not on file  . Frequency of Social Gatherings with Friends and Family: Not on file  . Attends Religious Services: Not on file  . Active Member of Clubs or Organizations: Not on file  . Attends Archivist Meetings: Not on file  . Marital Status: Not on file  Intimate Partner Violence:   . Fear of Current or Ex-Partner: Not on file  . Emotionally Abused: Not on file  . Physically Abused: Not on file  . Sexually Abused: Not on file    Review of Systems: See HPI, otherwise negative ROS  Physical Exam: BP 133/73   Pulse 73   Temp 97.7 F (36.5 C) (Temporal)   Resp 16   Ht 5\' 9"  (1.753 m)   Wt 92.8 kg   SpO2 96%   BMI 30.20 kg/m  General:   Alert,  pleasant and cooperative in NAD Head:  Normocephalic and atraumatic.  Impression/Plan: PERRI LAMAGNA is here for cataract surgery.  Risks, benefits, limitations, and alternatives regarding cataract surgery have been reviewed with the patient.  Questions have been answered.  All parties agreeable.   Benay Pillow, MD  08/12/2020, 10:13 AM

## 2020-08-13 ENCOUNTER — Encounter: Payer: Self-pay | Admitting: Ophthalmology

## 2020-11-17 NOTE — Progress Notes (Addendum)
11/18/2018 9:42 AM   Shawn Atkins 1955/11/30 [id number]  Referring provider: Lynnell Jude, MD [address] [place],  [place] [id number]  Chief Complaint  Patient presents with  . Benign Prostatic Hypertrophy   Urological history 1. BPH with LU TS - PSA 0.23 in 10/2019 - I PSS 15/2 - managed with tadalafil 5 mg daily  2. ED - SHIM 6 - contributing factors of age, BPH and DM - managed with tadalafil 5 mg daily   3. High risk hematuria - Former smoker - CTU 2014  left kidney demonstrates focal wedge-shaped indentation with near complete cortical loss within the lateral aspect of the mid pole of the kidney. A focal cyst is identified in this  area demonstrating Hounsfield units of 1 and 7 and measures approximately 1 cm - no cystoscopy for unknown reasons - UA is negative for micro heme  4. Nephrolithiasis - contrast CT in 2016 notes 2 mm right mid kidney nonobstructive calculus. 1-2 mm right kidney lower pole nonobstructive calculus. Potential 1 mm left kidney lower pole nonobstructive calculus - no stones seen on contrast CT in 2019  HPI: Shawn Atkins is a 65 y.o. male who presents for annual follow up.  Patient denies any modifying or aggravating factors.  Patient denies any gross hematuria, dysuria or suprapubic/flank pain.  Patient denies any fevers, chills, nausea or vomiting.    IPSS    Row Name 11/18/20 0900         International Prostate Symptom Score   How often have you had the sensation of not emptying your bladder? About half the time     How often have you had to urinate less than every two hours? Almost always     How often have you found you stopped and started again several times when you urinated? Less than 1 in 5 times     How often have you found it difficult to postpone urination? Almost always     How often have you had a weak urinary stream? Not at All     How often have you had to strain to start urination? Not at All     How many times  did you typically get up at night to urinate? 1 Time     Total IPSS Score 15           Quality of Life due to urinary symptoms   If you were to spend the rest of your life with your urinary condition just the way it is now how would you feel about that? Mostly Satisfied            Score:  1-7 Mild 8-19 Moderate 20-35 Severe  Patient not having spontaneous erections.  He denies any pain or curvature with erections.    SHIM    Row Name 11/18/20 [id number]         SHIM: Over the last 6 months:   How do you rate your confidence that you could get and keep an erection? Low     When you had erections with sexual stimulation, how often were your erections hard enough for penetration (entering your partner)? Almost Never or Never     During sexual intercourse, how often were you able to maintain your erection after you had penetrated (entered) your partner? Almost Never or Never     During sexual intercourse, how difficult was it to maintain your erection to completion of intercourse? Extremely Difficult  When you attempted sexual intercourse, how often was it satisfactory for you? Almost Never or Never           SHIM Total Score   SHIM 6            Score: 1-7 Severe ED 8-11 Moderate ED 12-16 Mild-Moderate ED 17-21 Mild ED 22-25 No ED    PMH: Past Medical History:  Diagnosis Date  . BPH (benign prostatic hypertrophy)   . Bronchitis   . Chronic prostatitis   . Complication of anesthesia    Felt like "couldn't breathe" after triple Hernia surgery  . Diabetes mellitus without complication ([place])   . Flank pain   . Frequency   . Gross hematuria   . HTN (hypertension)   . Inguinal hernia   . Kidney stones    stones  . Nocturia   . Overweight   . Spermatocele   . Stricture of urethra   . Vertigo    1 episode, 6-7 yrs ago    Surgical History: Past Surgical History:  Procedure Laterality Date  . CATARACT EXTRACTION W/PHACO Left 07/22/2020   Procedure: CATARACT  EXTRACTION PHACO AND INTRAOCULAR LENS PLACEMENT ([facility]) LEFT DIABETIC 4.14  00:33.0;  Surgeon: Eulogio Bear, MD;  Location: [place];  Service: Ophthalmology;  Laterality: Left;  Diabetic - oral meds  . CATARACT EXTRACTION W/PHACO Right 08/12/2020   Procedure: CATARACT EXTRACTION PHACO AND INTRAOCULAR LENS PLACEMENT ([place]) RIGHT DIABETIC;  Surgeon: Eulogio Bear, MD;  Location: [facility];  Service: Ophthalmology;  Laterality: Right;  1.99 0:26.2  . COLONOSCOPY WITH PROPOFOL N/A 11/20/2015   Procedure: COLONOSCOPY WITH PROPOFOL;  Surgeon: Christene Lye, MD;  Location: [facility];  Service: Endoscopy;  Laterality: N/A;  . HERNIA REPAIR Bilateral [id number]   umbilical and bil inguinal/ Dr Marina Gravel  . JOINT REPLACEMENT     knee  . KIDNEY STONE SURGERY    . open lithotomy      Home Medications:  Allergies as of 11/18/2020      Reactions   Penicillins Anaphylaxis   Morphine And Related Nausea And Vomiting      Medication List       Accurate as of November 18, 2020  9:42 AM. If you have any questions, ask your nurse or doctor.        albuterol (2.5 MG/3ML) 0.083% nebulizer solution Commonly known as: PROVENTIL albuterol sulfate 2.5 mg/3 mL (0.083 %) solution for nebulization   atorvastatin 20 MG tablet Commonly known as: LIPITOR Take 20 mg by mouth daily.   BIOTIN PO Take by mouth daily.   fluticasone 50 MCG/ACT nasal spray Commonly known as: FLONASE fluticasone 50 mcg/actuation nasal spray,suspension   lisinopril 10 MG tablet Commonly known as: ZESTRIL Take 10 mg by mouth daily. What changed: Another medication with the same name was removed. Continue taking this medication, and follow the directions you see here. Changed by: Zara Council, PA-C   metFORMIN 500 MG tablet Commonly known as: GLUCOPHAGE Take 500 mg by mouth 2 (two) times daily. What changed: Another medication with the same name was removed. Continue taking this medication,  and follow the directions you see here. Changed by: Zara Council, PA-C   multivitamin tablet Take 1 tablet by mouth daily.   tadalafil 5 MG tablet Commonly known as: CIALIS Take 1 tablet (5 mg total) by mouth daily as needed for erectile dysfunction.   traMADol 50 MG tablet Commonly known as: ULTRAM tramadol 50 mg tablet prn  TURMERIC PO Take by mouth daily.       Allergies:  Allergies  Allergen Reactions  . Penicillins Anaphylaxis  . Morphine And Related Nausea And Vomiting    Family History: Family History  Problem Relation Age of Onset  . Benign prostatic hyperplasia Father   . Kidney disease Neg Hx   . Prostate cancer Neg Hx     Social History:  reports that he quit smoking about 37 years ago. His smoking use included cigarettes. He quit after 15.00 years of use. His smokeless tobacco use includes snuff. He reports current alcohol use of about 1.0 standard drink of alcohol per week. He reports that he does not use drugs.  For pertinent review of systems please refer to history of present illness  Physical Exam: BP (!) 170/80   Pulse 87   Ht 5\' 9"  (1.753 m)   Wt 200 lb (90.7 kg)   BMI 29.53 kg/m   Constitutional:  Well nourished. Alert and oriented, No acute distress. HEENT: [place] AT, mask in place.  Trachea midline Cardiovascular: No clubbing, cyanosis, or edema. Respiratory: Normal respiratory effort, no increased work of breathing. GU: No CVA tenderness.  No bladder fullness or masses.  Patient with circumcised phallus.  Urethral meatus is patent.  No penile discharge. No penile lesions or rashes. Scrotum without lesions, cysts, rashes and/or edema.  Testicles are located scrotally bilaterally. No masses are appreciated in the testicles. Left and right epididymis are normal. Rectal: Patient with  normal sphincter tone. Anus and perineum without scarring or rashes. No rectal masses are appreciated. Prostate is approximately 60 grams, could only palpate the  apex and midportion of the gland, no nodules are appreciated. Seminal vesicles could not be palpated.  Condyloma on the left buttocks. Raspberry size.  Lymph: No inguinal adenopathy. Neurologic: Grossly intact, no focal deficits, moving all 4 extremities. Psychiatric: Normal mood and affect.  Laboratory Data: Component     Latest Ref Rng & Units 11/18/2016 11/15/2017 11/14/2018  Prostate Specific Ag, Serum     0.0 - 4.0 ng/mL 0.5 0.6 0.2   Component     Latest Ref Rng & Units 11/20/2019  Prostatic Specific Antigen     0.00 - 4.00 ng/mL 0.23   Component     Latest Ref Rng & Units 08/12/2020        10:50 AM  Glucose-Capillary     70 - 99 mg/dL 188 (H)    Urinalysis Component     Latest Ref Rng & Units 11/18/2020  Color, Urine     YELLOW YELLOW  Appearance     CLEAR CLEAR  Specific Gravity, Urine     1.005 - 1.030 1.020  pH     5.0 - 8.0 6.0  Glucose, UA     NEGATIVE mg/dL >1,000 (A)  Hgb urine dipstick     NEGATIVE NEGATIVE  Bilirubin Urine     NEGATIVE NEGATIVE  Ketones, ur     NEGATIVE mg/dL NEGATIVE  Protein     NEGATIVE mg/dL NEGATIVE  Nitrite     NEGATIVE NEGATIVE  Leukocytes,Ua     NEGATIVE NEGATIVE  Squamous Epithelial / LPF     0 - 5 0-5  WBC, UA     0 - 5 WBC/hpf 0-5  RBC / HPF     0 - 5 RBC/hpf 0-5  Bacteria, UA     NONE SEEN NONE SEEN   I have reviewed the labs.  Assessment & Plan:   1. BPH  with LUTS IPSS score is 15/2, it is worsening Continue conservative management, avoiding bladder irritants and timed voiding's Continue Cialis 5 mg daily  PSA pending RTC in 12 months for IPSS, PSA and exam   2. Erectile dysfunction - SHIM score is 6, it is worsened - continue tadalafil  - will check a testosterone level today  - RTC in 12 months for SHIM and exam   3. Glucosuria - will speak with PCP   Return in about 1 year (around 11/18/2021) for IPSS, SHIM, PSA and exam.  These notes generated with voice recognition software. I apologize for  typographical errors.  Zara Council, PA-C  [facility] [address], [place], [place] [address] [phone]

## 2020-11-18 ENCOUNTER — Ambulatory Visit (INDEPENDENT_AMBULATORY_CARE_PROVIDER_SITE_OTHER): Payer: PRIVATE HEALTH INSURANCE | Admitting: Urology

## 2020-11-18 ENCOUNTER — Other Ambulatory Visit
Admission: RE | Admit: 2020-11-18 | Discharge: 2020-11-18 | Disposition: A | Discharge status: Home / Self Care | Payer: PRIVATE HEALTH INSURANCE | Attending: Urology | Admitting: Urology

## 2020-11-18 ENCOUNTER — Encounter: Payer: Self-pay | Admitting: Urology

## 2020-11-18 ENCOUNTER — Other Ambulatory Visit: Payer: Self-pay

## 2020-11-18 VITALS — BP 170/80 | HR 87 | Ht 69.0 in | Wt 200.0 lb

## 2020-11-18 DIAGNOSIS — N138 Other obstructive and reflux uropathy: Secondary | ICD-10-CM

## 2020-11-18 DIAGNOSIS — N529 Male erectile dysfunction, unspecified: Secondary | ICD-10-CM | POA: Diagnosis present

## 2020-11-18 DIAGNOSIS — N401 Enlarged prostate with lower urinary tract symptoms: Secondary | ICD-10-CM | POA: Insufficient documentation

## 2020-11-18 LAB — URINALYSIS, COMPLETE (UACMP) WITH MICROSCOPIC
Bacteria, UA: NONE SEEN
Bilirubin Urine: NEGATIVE
Glucose, UA: >1000 mg/dL — AB
Hgb urine dipstick: NEGATIVE
Ketones, ur: NEGATIVE mg/dL
Leukocytes,Ua: NEGATIVE
Nitrite: NEGATIVE
Protein, ur: NEGATIVE mg/dL
Specific Gravity, Urine: 1.02 (ref 1.005–1.030)
pH: 6 (ref 5.0–8.0)

## 2020-11-18 LAB — PSA: Prostatic Specific Antigen: 0.75 ng/mL (ref 0.00–4.00)

## 2020-11-18 MED ORDER — TADALAFIL 5 MG PO TABS: 5.0000 mg | ORAL_TABLET | Freq: Every day | ORAL | 3 refills | Status: DC | PRN

## 2020-11-19 ENCOUNTER — Other Ambulatory Visit: Payer: Self-pay | Admitting: *Deleted

## 2020-11-19 DIAGNOSIS — N529 Male erectile dysfunction, unspecified: Secondary | ICD-10-CM

## 2020-11-19 LAB — TESTOSTERONE: Testosterone: 228 ng/dL — ABNORMAL LOW (ref 264–916)

## 2020-11-20 ENCOUNTER — Other Ambulatory Visit
Admission: RE | Admit: 2020-11-20 | Discharge: 2020-11-20 | Disposition: A | Discharge status: Home / Self Care | Payer: Medicare Other | Attending: Urology | Admitting: Urology

## 2020-11-20 ENCOUNTER — Other Ambulatory Visit: Payer: Self-pay

## 2020-11-20 DIAGNOSIS — N529 Male erectile dysfunction, unspecified: Secondary | ICD-10-CM | POA: Insufficient documentation

## 2020-11-21 LAB — TESTOSTERONE: Testosterone: 318 ng/dL (ref 264–916)

## 2021-11-12 ENCOUNTER — Other Ambulatory Visit: Payer: Self-pay | Admitting: Urology

## 2021-11-12 DIAGNOSIS — N401 Enlarged prostate with lower urinary tract symptoms: Secondary | ICD-10-CM

## 2021-11-12 DIAGNOSIS — N138 Other obstructive and reflux uropathy: Secondary | ICD-10-CM

## 2021-11-21 NOTE — Progress Notes (Signed)
11/18/2018 9:30 AM   Shawn Atkins Feb 07, 1956 [id number]  Referring provider: Lynnell Jude, MD [address] [place],  [place] [address]  Chief Complaint  Patient presents with   Follow-up    1 year follow-up   Urological history 1. BPH with LU TS - PSA pending - I PSS 4/2 - managed with tadalafil 5 mg daily  2. ED - contributing factors of age, BPH and DM - SHIM 17 - managed with tadalafil 5 mg daily   3. High risk hematuria - Former smoker - CTU 2014  left kidney demonstrates focal wedge-shaped indentation with near complete cortical loss within the lateral aspect of the mid pole of the kidney. A focal cyst is identified in this  area demonstrating Hounsfield units of 1 and 7 and measures approximately 1 cm - no cystoscopy for unknown reasons - UA negative for micro heme   4. Nephrolithiasis - contrast CT in 2016 notes 2 mm right mid kidney nonobstructive calculus. 1-2 mm right kidney lower pole nonobstructive calculus. Potential 1 mm left kidney lower pole nonobstructive calculus - no stones seen on contrast CT in 2019  HPI: Shawn Atkins is a 66 y.o. male who presents for annual follow up.  He has nocturia x 2.   Patient denies any modifying or aggravating factors.  Patient denies any gross hematuria, dysuria or suprapubic/flank pain.  Patient denies any fevers, chills, nausea or vomiting.     IPSS     Row Name 11/24/21 0900         International Prostate Symptom Score   How often have you had the sensation of not emptying your bladder? Less than 1 in 5     How often have you had to urinate less than every two hours? Less than 1 in 5 times     How often have you found you stopped and started again several times when you urinated? Not at All     How often have you found it difficult to postpone urination? Not at All     How often have you had a weak urinary stream? Not at All     How often have you had to strain to start urination? Not at All     How many  times did you typically get up at night to urinate? 2 Times     Total IPSS Score 4       Quality of Life due to urinary symptoms   If you were to spend the rest of your life with your urinary condition just the way it is now how would you feel about that? Mostly Satisfied               Score:  1-7 Mild 8-19 Moderate 20-35 Severe  Patient is not having spontaneous erections.  He denies any pain or curvature with erections.  He is having sub par results with the tadalafil 5 mg daily.     SHIM     Row Name 11/24/21 [id number]         SHIM: Over the last 6 months:   How do you rate your confidence that you could get and keep an erection? Moderate     When you had erections with sexual stimulation, how often were your erections hard enough for penetration (entering your partner)? Most Times (much more than half the time)     During sexual intercourse, how often were you able to maintain your erection after you had  penetrated (entered) your partner? Sometimes (about half the time)     During sexual intercourse, how difficult was it to maintain your erection to completion of intercourse? Slightly Difficult     When you attempted sexual intercourse, how often was it satisfactory for you? Most Times (much more than half the time)       SHIM Total Score   SHIM 18               Score: 1-7 Severe ED 8-11 Moderate ED 12-16 Mild-Moderate ED 17-21 Mild ED 22-25 No ED    PMH: Past Medical History:  Diagnosis Date   BPH (benign prostatic hypertrophy)    Bronchitis    Chronic prostatitis    Complication of anesthesia    Felt like "couldn't breathe" after triple Hernia surgery   Diabetes mellitus without complication ([facility])    Flank pain    Frequency    Gross hematuria    HTN (hypertension)    Inguinal hernia    Kidney stones    stones   Nocturia    Overweight    Spermatocele    Stricture of urethra    Vertigo    1 episode, 6-7 yrs ago    Surgical History: Past  Surgical History:  Procedure Laterality Date   CATARACT EXTRACTION W/PHACO Left 07/22/2020   Procedure: CATARACT EXTRACTION PHACO AND INTRAOCULAR LENS PLACEMENT ([place]) LEFT DIABETIC 4.14  00:33.0;  Surgeon: Eulogio Bear, MD;  Location: [place];  Service: Ophthalmology;  Laterality: Left;  Diabetic - oral meds   CATARACT EXTRACTION W/PHACO Right 08/12/2020   Procedure: CATARACT EXTRACTION PHACO AND INTRAOCULAR LENS PLACEMENT ([place]) RIGHT DIABETIC;  Surgeon: Eulogio Bear, MD;  Location: [place];  Service: Ophthalmology;  Laterality: Right;  1.99 0:26.2   COLONOSCOPY WITH PROPOFOL N/A 11/20/2015   Procedure: COLONOSCOPY WITH PROPOFOL;  Surgeon: Christene Lye, MD;  Location: [facility];  Service: Endoscopy;  Laterality: N/A;   HERNIA REPAIR Bilateral [id number]   umbilical and bil inguinal/ Dr Marina Gravel   JOINT REPLACEMENT     knee   KIDNEY STONE SURGERY     open lithotomy      Home Medications:  Allergies as of 11/24/2021       Reactions   Penicillins Anaphylaxis   Morphine And Related Nausea And Vomiting        Medication List        Accurate as of November 24, 2021  9:30 AM. If you have any questions, ask your nurse or doctor.          albuterol (2.5 MG/3ML) 0.083% nebulizer solution Commonly known as: PROVENTIL albuterol sulfate 2.5 mg/3 mL (0.083 %) solution for nebulization   atorvastatin 20 MG tablet Commonly known as: LIPITOR Take 20 mg by mouth daily.   BIOTIN PO Take by mouth daily.   fluticasone 50 MCG/ACT nasal spray Commonly known as: FLONASE fluticasone 50 mcg/actuation nasal spray,suspension   lisinopril 10 MG tablet Commonly known as: ZESTRIL Take 10 mg by mouth daily.   metFORMIN 500 MG tablet Commonly known as: GLUCOPHAGE Take 500 mg by mouth 2 (two) times daily.   multivitamin tablet Take 1 tablet by mouth daily.   Ozempic (0.25 or 0.5 MG/DOSE) 2 MG/1.5ML Sopn Generic drug: Semaglutide(0.25 or  0.5MG /DOS) Inject 0.5 mg into the skin once a week.   tadalafil 5 MG tablet Commonly known as: CIALIS TAKE ONE TABLET BY MOUTH DAILY AS NEEDED FOR ERECTILE DYSFUNCTION   traMADol 50 MG  tablet Commonly known as: ULTRAM tramadol 50 mg tablet prn   TURMERIC PO Take by mouth daily.        Allergies:  Allergies  Allergen Reactions   Penicillins Anaphylaxis   Morphine And Related Nausea And Vomiting    Family History: Family History  Problem Relation Age of Onset   Benign prostatic hyperplasia Father    Kidney disease Neg Hx    Prostate cancer Neg Hx     Social History:  reports that he quit smoking about 38 years ago. His smoking use included cigarettes. His smokeless tobacco use includes snuff. He reports current alcohol use of about 1.0 standard drink per week. He reports that he does not use drugs.  For pertinent review of systems please refer to history of present illness  Physical Exam: BP 127/79    Pulse 90    Ht 5\' 9"  (1.753 m)    Wt 210 lb (95.3 kg)    BMI 31.01 kg/m   Constitutional:  Well nourished. Alert and oriented, No acute distress. HEENT: [place] AT, mask in place.  Trachea midline Cardiovascular: No clubbing, cyanosis, or edema. Respiratory: Normal respiratory effort, no increased work of breathing. GU: No CVA tenderness.  No bladder fullness or masses.  Patient with circumcised phallus.  Urethral meatus is patent.  No penile discharge. No penile lesions or rashes. Scrotum without lesions, cysts, rashes and/or edema.  Testicles are located scrotally bilaterally. No masses are appreciated in the testicles. Left and right epididymis are normal.  Left hydrocele noted.  Rectal: Patient with  normal sphincter tone. Anus and perineum without scarring or rashes. No rectal masses are appreciated. Prostate is approximately 50 grams, no nodules are appreciated. Seminal vesicles could not be palpated Neurologic: Grossly intact, no focal deficits, moving all 4  extremities. Psychiatric: Normal mood and affect.   Laboratory Data:  Component     Latest Ref Rng & Units 11/24/2021  Color, Urine     YELLOW YELLOW  Appearance     CLEAR CLEAR  Specific Gravity, Urine     1.005 - 1.030 1.020  pH     5.0 - 8.0 5.5  Glucose, UA     NEGATIVE mg/dL 500 (A)  Hgb urine dipstick     NEGATIVE NEGATIVE  Bilirubin Urine     NEGATIVE NEGATIVE  Ketones, ur     NEGATIVE mg/dL TRACE (A)  Protein     NEGATIVE mg/dL NEGATIVE  Nitrite     NEGATIVE NEGATIVE  Leukocytes,Ua     NEGATIVE NEGATIVE  Squamous Epithelial / LPF     0 - 5 0-5  WBC, UA     0 - 5 WBC/hpf 0-5  RBC / HPF     0 - 5 RBC/hpf 0-5  Bacteria, UA     NONE SEEN NONE SEEN  Ca Oxalate Crys, UA      PRESENT  I have reviewed the labs.  Assessment & Plan:   1. BPH with LUTS -PSA pending  -DRE benign -UA benign -symptoms - nocturia x 2 -continue conservative management, avoiding bladder irritants and timed voiding's -Continue Cialis 5 mg daily   2. Erectile dysfunction -sub par results with the tadalafil 5 mg daily -will check a testosterone level at this time per AUA guidelines   Return in about 1 year (around 11/24/2022) for IPSS, SHIM, UA, PSA and exam.  These notes generated with voice recognition software. I apologize for typographical errors.  Royden Purl  [place]  [address] [place], [place] [address] [phone]

## 2021-11-24 ENCOUNTER — Ambulatory Visit: Payer: Medicare Other | Admitting: Urology

## 2021-11-24 ENCOUNTER — Other Ambulatory Visit: Payer: Self-pay | Admitting: *Deleted

## 2021-11-24 ENCOUNTER — Encounter: Payer: Self-pay | Admitting: Urology

## 2021-11-24 ENCOUNTER — Other Ambulatory Visit: Payer: Self-pay

## 2021-11-24 ENCOUNTER — Other Ambulatory Visit
Admission: RE | Admit: 2021-11-24 | Discharge: 2021-11-24 | Disposition: A | Discharge status: Home / Self Care | Payer: Medicare Other | Attending: Urology | Admitting: Urology

## 2021-11-24 VITALS — BP 127/79 | HR 90 | Ht 69.0 in | Wt 210.0 lb

## 2021-11-24 DIAGNOSIS — N529 Male erectile dysfunction, unspecified: Secondary | ICD-10-CM | POA: Diagnosis present

## 2021-11-24 DIAGNOSIS — N401 Enlarged prostate with lower urinary tract symptoms: Secondary | ICD-10-CM

## 2021-11-24 DIAGNOSIS — N138 Other obstructive and reflux uropathy: Secondary | ICD-10-CM

## 2021-11-24 LAB — URINALYSIS, COMPLETE (UACMP) WITH MICROSCOPIC
Bacteria, UA: NONE SEEN
Bilirubin Urine: NEGATIVE
Glucose, UA: 500 mg/dL — AB
Hgb urine dipstick: NEGATIVE
Leukocytes,Ua: NEGATIVE
Nitrite: NEGATIVE
Protein, ur: NEGATIVE mg/dL
Specific Gravity, Urine: 1.02 (ref 1.005–1.030)
pH: 5.5 (ref 5.0–8.0)

## 2021-11-24 LAB — PSA: Prostatic Specific Antigen: 0.39 ng/mL (ref 0.00–4.00)

## 2021-11-24 NOTE — Addendum Note (Signed)
Addended by: Despina Hidden on: 11/24/2021 08:42 AM   Modules accepted: Orders

## 2021-11-25 LAB — TESTOSTERONE: Testosterone: 238 ng/dL — ABNORMAL LOW (ref 264–916)

## 2021-11-27 ENCOUNTER — Other Ambulatory Visit: Payer: Self-pay | Admitting: *Deleted

## 2021-11-27 DIAGNOSIS — N401 Enlarged prostate with lower urinary tract symptoms: Secondary | ICD-10-CM

## 2021-11-27 DIAGNOSIS — N529 Male erectile dysfunction, unspecified: Secondary | ICD-10-CM

## 2021-11-27 DIAGNOSIS — N138 Other obstructive and reflux uropathy: Secondary | ICD-10-CM

## 2021-11-28 ENCOUNTER — Other Ambulatory Visit
Admission: RE | Admit: 2021-11-28 | Discharge: 2021-11-28 | Disposition: A | Discharge status: Home / Self Care | Payer: Medicare Other | Attending: Urology | Admitting: Urology

## 2021-11-28 DIAGNOSIS — N138 Other obstructive and reflux uropathy: Secondary | ICD-10-CM | POA: Diagnosis present

## 2021-11-28 DIAGNOSIS — N401 Enlarged prostate with lower urinary tract symptoms: Secondary | ICD-10-CM | POA: Insufficient documentation

## 2021-11-28 DIAGNOSIS — N529 Male erectile dysfunction, unspecified: Secondary | ICD-10-CM | POA: Diagnosis present

## 2021-11-29 LAB — TESTOSTERONE: Testosterone: 274 ng/dL (ref 264–916)

## 2021-12-12 NOTE — Progress Notes (Signed)
12/25/21 8:38 AM   Shawn Atkins July 07, 1956 [id number]  Referring provider:  Lynnell Jude, MD [address] [place],  [place] [address] Chief Complaint  Patient presents with   Follow-up    Urological history:  1. BPH with LU TS - PSA 0.39 11/24/2021 - managed with tadalafil 5 mg daily   2. ED - contributing factors of age, BPH, testosterone deficiency and DM - managed with tadalafil 5 mg daily  - Testosterone on 11/24/2021 was 238 it was recked on 11/28/2021 was 274   3. High risk hematuria - Former smoker - CTU 2014  left kidney demonstrates focal wedge-shaped indentation with near complete cortical loss within the lateral aspect of the mid pole of the kidney. A focal cyst is identified in this  area demonstrating Hounsfield units of 1 and 7 and measures approximately 1 cm - no cystoscopy for unknown reasons - UA negative for micro heme 10/2021   4. Nephrolithiasis - contrast CT in 2016 notes 2 mm right mid kidney nonobstructive calculus. 1-2 mm right kidney lower pole nonobstructive calculus. Potential 1 mm left kidney lower pole nonobstructive calculus - no stones seen on contrast CT in 2019    HPI: Shawn Atkins is a 66 y.o.male who presents today for low testosterone.   He is doing well today.  He reports fatigue, low libido and erectile dysfunction.  He has had 2 morning testosterone's below 300 two days apart.    PMH: Past Medical History:  Diagnosis Date   BPH (benign prostatic hypertrophy)    Bronchitis    Chronic prostatitis    Complication of anesthesia    Felt like "couldn't breathe" after triple Hernia surgery   Diabetes mellitus without complication (HCC)    Flank pain    Frequency    Gross hematuria    HTN (hypertension)    Inguinal hernia    Kidney stones    stones   Nocturia    Overweight    Spermatocele    Stricture of urethra    Vertigo    1 episode, 6-7 yrs ago    Surgical History: Past Surgical History:  Procedure Laterality  Date   CATARACT EXTRACTION W/PHACO Left 07/22/2020   Procedure: CATARACT EXTRACTION PHACO AND INTRAOCULAR LENS PLACEMENT ([place]) LEFT DIABETIC 4.14  00:33.0;  Surgeon: Eulogio Bear, MD;  Location: [facility];  Service: Ophthalmology;  Laterality: Left;  Diabetic - oral meds   CATARACT EXTRACTION W/PHACO Right 08/12/2020   Procedure: CATARACT EXTRACTION PHACO AND INTRAOCULAR LENS PLACEMENT ([place]) RIGHT DIABETIC;  Surgeon: Eulogio Bear, MD;  Location: [facility];  Service: Ophthalmology;  Laterality: Right;  1.99 0:26.2   COLONOSCOPY WITH PROPOFOL N/A 11/20/2015   Procedure: COLONOSCOPY WITH PROPOFOL;  Surgeon: Christene Lye, MD;  Location: [facility];  Service: Endoscopy;  Laterality: N/A;   HERNIA REPAIR Bilateral 4132   umbilical and bil inguinal/ Dr Marina Gravel   JOINT REPLACEMENT     knee   KIDNEY STONE SURGERY     open lithotomy      Home Medications:  Allergies as of 12/15/2021       Reactions   Penicillins Anaphylaxis   Morphine And Related Nausea And Vomiting        Medication List        Accurate as of December 15, 2021 11:59 PM. If you have any questions, ask your nurse or doctor.          albuterol (2.5 MG/3ML) 0.083% nebulizer solution  Commonly known as: PROVENTIL albuterol sulfate 2.5 mg/3 mL (0.083 %) solution for nebulization   atorvastatin 20 MG tablet Commonly known as: LIPITOR Take 20 mg by mouth daily.   BIOTIN PO Take by mouth daily.   clomiPHENE 50 MG tablet Commonly known as: CLOMID Take 1/2 tablet daily Started by: Karlie Aung, PA-C   fluticasone 50 MCG/ACT nasal spray Commonly known as: FLONASE fluticasone 50 mcg/actuation nasal spray,suspension   lisinopril 10 MG tablet Commonly known as: ZESTRIL Take 10 mg by mouth daily.   metFORMIN 500 MG 24 hr tablet Commonly known as: GLUCOPHAGE-XR Take 2,000 mg by mouth daily. What changed: Another medication with the same name was removed. Continue taking  this medication, and follow the directions you see here. Changed by: Zara Council, PA-C   multivitamin tablet Take 1 tablet by mouth daily.   Ozempic (0.25 or 0.5 MG/DOSE) 2 MG/1.5ML Sopn Generic drug: Semaglutide(0.25 or 0.5MG /DOS) Inject 0.5 mg into the skin once a week.   tadalafil 5 MG tablet Commonly known as: CIALIS TAKE ONE TABLET BY MOUTH DAILY AS NEEDED FOR ERECTILE DYSFUNCTION   testosterone 50 MG/5GM (1%) Gel Commonly known as: ANDROGEL Place 5 g onto the skin daily. Started by: Zara Council, PA-C   traMADol 50 MG tablet Commonly known as: ULTRAM tramadol 50 mg tablet prn   TURMERIC PO Take by mouth daily.        Allergies:  Allergies  Allergen Reactions   Penicillins Anaphylaxis   Morphine And Related Nausea And Vomiting    Family History: Family History  Problem Relation Age of Onset   Benign prostatic hyperplasia Father    Kidney disease Neg Hx    Prostate cancer Neg Hx     Social History:  reports that he quit smoking about 38 years ago. His smoking use included cigarettes. He has been exposed to tobacco smoke. His smokeless tobacco use includes snuff. He reports current alcohol use of about 1.0 standard drink per week. He reports that he does not use drugs.   Physical Exam: BP 135/79    Pulse 90    Ht 5\' 9"  (1.753 m)    Wt 211 lb (95.7 kg)    BMI 31.16 kg/m   Constitutional:  Well nourished. Alert and oriented, No acute distress. HEENT: [place] AT, mask in place  trachea midline Cardiovascular: No clubbing, cyanosis, or edema. Respiratory: Normal respiratory effort, no increased work of breathing. Neurologic: Grossly intact, no focal deficits, moving all 4 extremities. Psychiatric: Normal mood and affect.   Laboratory Data: Component     Latest Ref Rng & Units 11/24/2021 11/28/2021  Testosterone     264 - 916 ng/dL 238 (L) 274   Component Prostatic Specific Antigen  Latest Ref Rng & Units 0.00 - 4.00 ng/mL  11/20/2019 0.23  11/18/2020  0.75  11/24/2021 0.39  I have reviewed the labs.   Pertinent Imaging N/A  Assessment & Plan:    1. Testosterone deficiency -Significant symptoms -Recommend starting TRT -We discussed the most common forms of replacement including intramuscular injection and gels and he would like to check with his insurance regarding which TRT are covered -Follow-up 6 weeks after starting TRT for testosterone level and symptom check -Potential side effects of testosterone replacement were discussed including stimulation of benign prostatic growth with lower urinary tract symptoms; erythrocytosis; edema; gynecomastia; worsening sleep apnea; venous thromboembolism; testicular atrophy and infertility. Recent studies suggesting an increased incidence of heart attack and stroke in patients taking testosterone was discussed. He  was informed there is conflicting evidence regarding the impact of testosterone therapy on cardiovascular risk. The theoretical risk of growth stimulation of an undetected prostate cancer was also discussed.  He was informed that current evidence does not provide any definitive answers regarding the risks of testosterone therapy on prostate cancer and cardiovascular disease. The need for periodic monitoring of his testosterone level, PSA, hematocrit and DRE was discussed. - Luteinizing hormone; pending  - Prolactin level; pending   Return in about 6 weeks (around 01/26/2022) for Testosterone, hemoglobin hematocrit only.  [place] [address], [place], [place] [address] [phone]  [facility] as a scribe for [facility], PA-C.,have documented all relevant documentation on the behalf of Hope Brandenburger, PA-C,as directed by  [facility], PA-C while in the presence of Xayne Brumbaugh, PA-C.  I spent 25 minutes on the day of the encounter to include pre-visit record review, face-to-face time with the patient discussing the risks  and benefit of testosterone replacement therapy, and post-visit ordering of tests.

## 2021-12-15 ENCOUNTER — Ambulatory Visit: Payer: Medicare Other | Admitting: Urology

## 2021-12-15 ENCOUNTER — Other Ambulatory Visit: Payer: Self-pay

## 2021-12-15 ENCOUNTER — Encounter: Payer: Self-pay | Admitting: Urology

## 2021-12-15 ENCOUNTER — Other Ambulatory Visit: Payer: Self-pay | Admitting: *Deleted

## 2021-12-15 ENCOUNTER — Other Ambulatory Visit
Admission: RE | Admit: 2021-12-15 | Discharge: 2021-12-15 | Disposition: A | Discharge status: Home / Self Care | Payer: Medicare Other | Attending: Urology | Admitting: Urology

## 2021-12-15 VITALS — BP 135/79 | HR 90 | Ht 69.0 in | Wt 211.0 lb

## 2021-12-15 DIAGNOSIS — N138 Other obstructive and reflux uropathy: Secondary | ICD-10-CM | POA: Insufficient documentation

## 2021-12-15 DIAGNOSIS — E349 Endocrine disorder, unspecified: Secondary | ICD-10-CM

## 2021-12-15 DIAGNOSIS — N529 Male erectile dysfunction, unspecified: Secondary | ICD-10-CM | POA: Diagnosis present

## 2021-12-15 DIAGNOSIS — N401 Enlarged prostate with lower urinary tract symptoms: Secondary | ICD-10-CM

## 2021-12-15 MED ORDER — TESTOSTERONE 50 MG/5GM (1%) TD GEL: 5.0000 g | Freq: Every day | TRANSDERMAL | 4 refills | Status: DC

## 2021-12-15 MED ORDER — CLOMIPHENE CITRATE 50 MG PO TABS: ORAL_TABLET | ORAL | 0 refills | Status: DC

## 2021-12-15 NOTE — Patient Instructions (Addendum)
Testosterone cypionate  shots   Androgel, Testim Testosterone gel   Clomid pill

## 2021-12-16 LAB — PROLACTIN: Prolactin: 4.3 ng/mL (ref 4.0–15.2)

## 2021-12-16 LAB — LUTEINIZING HORMONE: LH: 5.6 m[IU]/mL (ref 1.7–8.6)

## 2021-12-22 ENCOUNTER — Other Ambulatory Visit: Payer: Self-pay | Admitting: *Deleted

## 2021-12-22 ENCOUNTER — Other Ambulatory Visit: Payer: Self-pay | Admitting: Urology

## 2021-12-22 DIAGNOSIS — E349 Endocrine disorder, unspecified: Secondary | ICD-10-CM

## 2021-12-22 MED ORDER — TESTOSTERONE 50 MG/5GM (1%) TD GEL: 5.0000 g | Freq: Every day | TRANSDERMAL | 4 refills | Status: DC

## 2022-03-17 ENCOUNTER — Ambulatory Visit
Admission: EM | Admit: 2022-03-17 | Discharge: 2022-03-17 | Disposition: A | Discharge status: Home / Self Care | Payer: Medicare Other

## 2022-03-17 ENCOUNTER — Ambulatory Visit (INDEPENDENT_AMBULATORY_CARE_PROVIDER_SITE_OTHER): Payer: Medicare Other

## 2022-03-17 DIAGNOSIS — M542 Cervicalgia: Secondary | ICD-10-CM

## 2022-03-17 DIAGNOSIS — M25511 Pain in right shoulder: Secondary | ICD-10-CM | POA: Diagnosis not present

## 2022-03-17 MED ORDER — MELOXICAM 15 MG PO TABS: 15.0000 mg | ORAL_TABLET | Freq: Every day | ORAL | 0 refills | Status: AC

## 2022-03-17 NOTE — Discharge Instructions (Addendum)
-  Your shoulder x-ray is normal.  It does not appear that there is any significant arthritis and there is no fractures or dislocations.  You could have a possible partial torn rotator cuff.  The other possibility is that you may have a pinched nerve in your neck or shoulder. - I have sent Mobic anti-inflammatory medicine pharmacy.  Take this but no other anti-inflammatory medicine with it.  Also take Tylenol.  Continue cyclobenzaprine. Continue ice the shoulder.  Try to perform stretches to the point you have pain but not past it.  Avoid any overhead reaching or lifting. - Follow-up with your primary care provider as they may need to order an MRI for you or refer you to orthopedics.  You can always go to [facility] as well.  That is a walk-in urgent care in [place] and they do not need an appointment for. - The other thing that you may consider is results physical therapy in [place].  You do not need an appointment there either.

## 2022-03-17 NOTE — ED Provider Notes (Signed)
[facility]    CSN: [id number] Arrival date & time: 03/17/22  1002      History   Chief Complaint Chief Complaint  Patient presents with   Shoulder Pain    Right     HPI Shawn Atkins is a 66 y.o. 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  He thinks he might lift something too heavy but he thinks this also could have happened at home.  He does not remember a specific instance this occurred.  Patient reports he felt something pop in his shoulder today and has had worse pain since.  He did see his primary care provider 1 week ago and had injections in his shoulder.  States he had 4 different injections in his shoulder because his doctor felt "knots."  He has not had any issues with the shoulder in the past.  He has been taking ibuprofen and Tylenol and icing the shoulder which has helped.  He reports increased pain when he tries to reach forward or raise his shoulder or extend his neck.  He also does have neck pain.  He has no associated numbness, tingling or weakness.  No other complaints.  HPI  Past Medical History:  Diagnosis Date   BPH (benign prostatic hypertrophy)    Bronchitis    Chronic prostatitis    Complication of anesthesia    Felt like "couldn't breathe" after triple Hernia surgery   Diabetes mellitus without complication (HCC)    Flank pain    Frequency    Gross hematuria    HTN (hypertension)    Inguinal hernia    Kidney stones    stones   Nocturia    Overweight    Spermatocele    Stricture of urethra    Vertigo    1 episode, 6-7 yrs ago    Patient Active Problem List   Diagnosis Date Noted   History of rectal bleeding 06/01/2018   Left groin pain 03/18/2018   Sacroiliac joint pain 01/13/2017   Abdominal pain, left lower  quadrant 08/23/2015   BPH with obstruction/lower urinary tract symptoms 08/23/2015   Biceps tendinitis 06/25/2015    Past Surgical History:  Procedure Laterality Date   CATARACT EXTRACTION W/PHACO Left 07/22/2020   Procedure: CATARACT EXTRACTION PHACO AND INTRAOCULAR LENS PLACEMENT ([place]) LEFT DIABETIC 4.14  00:33.0;  Surgeon: Eulogio Bear, MD;  Location: [facility];  Service: Ophthalmology;  Laterality: Left;  Diabetic - oral meds   CATARACT EXTRACTION W/PHACO Right 08/12/2020   Procedure: CATARACT EXTRACTION PHACO AND INTRAOCULAR LENS PLACEMENT ([place]) RIGHT DIABETIC;  Surgeon: Eulogio Bear, MD;  Location: [facility];  Service: Ophthalmology;  Laterality: Right;  1.99 0:26.2   COLONOSCOPY WITH PROPOFOL N/A 11/20/2015   Procedure: COLONOSCOPY WITH PROPOFOL;  Surgeon: Christene Lye, MD;  Location: [facility];  Service: Endoscopy;  Laterality: N/A;   HERNIA REPAIR Bilateral 0932   umbilical and bil inguinal/ Dr Marina Gravel   JOINT REPLACEMENT     knee   KIDNEY STONE SURGERY     open lithotomy         Home Medications    Prior to Admission medications   Medication Sig Start Date End Date Taking? Authorizing Provider  albuterol (PROVENTIL) (2.5 MG/3ML) 0.083% nebulizer  solution albuterol sulfate 2.5 mg/3 mL (0.083 %) solution for nebulization   Yes [provider]  atorvastatin (LIPITOR) 20 MG tablet Take 20 mg by mouth daily.   Yes [provider]  BIOTIN PO Take by mouth daily.   Yes [provider]  clomiPHENE (CLOMID) 50 MG tablet Take 1/2 tablet daily 12/15/21  Yes McGowan, Larene Beach A, PA-C  fluticasone (FLONASE) 50 MCG/ACT nasal spray fluticasone 50 mcg/actuation nasal spray,suspension 10/13/16  Yes [provider]  lisinopril (ZESTRIL) 10 MG tablet Take 10 mg by mouth daily. 10/01/20  Yes [provider]  meloxicam (MOBIC) 15 MG tablet Take 1 tablet (15 mg total) by mouth daily. 03/17/22 04/16/22 Yes Danton Clap, PA-C  metFORMIN (GLUCOPHAGE-XR) 500 MG 24 hr tablet Take 2,000 mg by mouth daily. 11/26/21  Yes [provider]  Multiple Vitamin (MULTIVITAMIN) tablet Take 1 tablet by mouth daily.   Yes [provider]  OZEMPIC, 0.25 OR 0.5 MG/DOSE, 2 MG/1.5ML SOPN Inject 0.5 mg into the skin once a week. 11/17/21  Yes [provider]  tadalafil (CIALIS) 5 MG tablet TAKE ONE TABLET BY MOUTH DAILY AS NEEDED FOR ERECTILE DYSFUNCTION 11/12/21  Yes McGowan, Larene Beach A, PA-C  testosterone (ANDROGEL) 50 MG/5GM (1%) GEL Place 5 g onto the skin daily. 12/22/21  Yes McGowan, Larene Beach A, PA-C  traMADol (ULTRAM) 50 MG tablet tramadol 50 mg tablet prn 06/25/15  Yes [provider]  TURMERIC PO Take by mouth daily.   Yes [provider]  cyclobenzaprine (FLEXERIL) 10 MG tablet Take 10 mg by mouth 3 (three) times daily as needed. 03/09/22   [provider]    Family History Family History  Problem Relation Age of Onset   Benign prostatic hyperplasia Father    Kidney disease Neg Hx    Prostate cancer Neg Hx     Social History Social History   Tobacco Use   Smoking status: Former    Years: 15.00    Types: Cigarettes    Quit date: 1985    Years since quitting: 38.4    Passive exposure: Past   Smokeless tobacco: Current    Types: Snuff  Vaping Use   Vaping Use: Never used  Substance Use Topics   Alcohol use: Yes    Alcohol/week: 1.0 standard drink    Types: 1 Cans of beer per week    Comment: moderate   Drug use: No     Allergies   Penicillins and Morphine and related   Review of Systems Review of Systems  Musculoskeletal:  Positive for arthralgias and neck pain. Negative for back pain, joint swelling and neck stiffness.  Neurological:  Negative for weakness and numbness.    Physical Exam Triage Vital Signs ED Triage Vitals  Enc Vitals Group     BP 03/17/22 1013 133/79     Pulse Rate 03/17/22 1013 88     Resp 03/17/22 1013 18     Temp  03/17/22 1013 98.2 F (36.8 C)     Temp Source 03/17/22 1013 Oral     SpO2 03/17/22 1013 93 %     Weight 03/17/22 1011 205 lb (93 kg)     Height 03/17/22 1011 '5\' 9"'$  (1.753 m)     Head Circumference --      Peak Flow --      Pain Score 03/17/22 1009 6     Pain Loc --      Pain Edu? --  Excl. in GC? --    No data found.  Updated Vital Signs BP 133/79 (BP Location: Left Arm)   Pulse 88   Temp 98.2 F (36.8 C) (Oral)   Resp 18   Ht '5\' 9"'$  (1.753 m)   Wt 205 lb (93 kg)   SpO2 93%   BMI 30.27 kg/m     Physical Exam Vitals and nursing note reviewed.  Constitutional:      General: He is not in acute distress.    Appearance: Normal appearance. He is well-developed. He is not ill-appearing.  HENT:     Head: Normocephalic and atraumatic.  Eyes:     General: No scleral icterus.    Conjunctiva/sclera: Conjunctivae normal.  Cardiovascular:     Rate and Rhythm: Normal rate and regular rhythm.  Pulmonary:     Effort: Pulmonary effort is normal. No respiratory distress.  Musculoskeletal:     Right shoulder: Tenderness (TTP right biceps groove) and crepitus present. Decreased range of motion (reduced abduction to 120 degrees, painful flexion). Normal strength.     Cervical back: Neck supple. Tenderness (C6-C8 spinal and right paracervical muscles, right trapezius) present. Pain with movement (extension and flexion) present. Normal range of motion.  Skin:    General: Skin is warm and dry.     Capillary Refill: Capillary refill takes less than 2 seconds.  Neurological:     General: No focal deficit present.     Mental Status: He is alert. Mental status is at baseline.     Motor: No weakness.     Coordination: Coordination normal.     Gait: Gait normal.  Psychiatric:        Mood and Affect: Mood normal.        Behavior: Behavior normal.        Thought Content: Thought content normal.     UC Treatments / Results  Labs (all labs ordered are listed, but only abnormal results  are displayed) Labs Reviewed - No data to display  EKG   Radiology DG Shoulder Right  Result Date: 03/17/2022 CLINICAL DATA:  Pain over the last month, worsening today. EXAM: RIGHT SHOULDER - 2+ VIEW COMPARISON:  None FINDINGS: Glenohumeral joint is normal. Normal humeral acromial distance. AC joint is unremarkable. Other regional bony structures appear normal. IMPRESSION: Negative shoulder radiography. Electronically Signed   By: Nelson Chimes M.D.   On: 03/17/2022 10:33    Procedures Procedures (including critical care time)  Medications Ordered in UC Medications - No data to display  Initial Impression / Assessment and Plan / UC Course  I have reviewed the triage vital signs and the nursing notes.  Pertinent labs & imaging results that were available during my care of the patient were reviewed by me and considered in my medical decision making (see chart for details).  66 year old male presenting for right-sided neck pain and right shoulder pain for the past 1 month which worsened over the past 1 week and especially today after he felt a pop in his shoulder.  Reports increased pain in his neck with extension and flexion and increased pain in the shoulder with trying to raise it above 90 degrees.  Today vitals are normal and stable and he is overall well-appearing.  He has diffuse tenderness palpation of the right trapezius, right paracervical muscles and C-spine from C6-C8.  Additionally has tenderness palpation of the biceps groove.  Reduced range of motion of right shoulder abduction to about 120 degrees.  Very painful shoulder abduction  and supraspinatus testing.  An x-ray of shoulder obtained today shows no acute abnormalities.  I discussed this with him.  Patient may have possible partial rotator cuff tear versus cervical radiculopathy.  Since he has already been seeing his primary care provider about this I advised he continue to follow-up with her.  Today I prescribed Mobic for him to  take and advised to continue Tylenol and following RICE guidelines.  Also advised him to try to reach out to physical therapy.  Reviewed no lifting overhead.  May need an MRI of the shoulder or neck at this point.  ER precautions for neck pain reviewed with patient.  Return here as needed.   Final Clinical Impressions(s) / UC Diagnoses   Final diagnoses:  Acute pain of right shoulder  Cervicalgia     Discharge Instructions      -Your shoulder x-ray is normal.  It does not appear that there is any significant arthritis and there is no fractures or dislocations.  You could have a possible partial torn rotator cuff.  The other possibility is that you may have a pinched nerve in your neck or shoulder. - I have sent Mobic anti-inflammatory medicine pharmacy.  Take this but no other anti-inflammatory medicine with it.  Also take Tylenol.  Continue cyclobenzaprine. Continue ice the shoulder.  Try to perform stretches to the point you have pain but not past it.  Avoid any overhead reaching or lifting. - Follow-up with your primary care provider as they may need to order an MRI for you or refer you to orthopedics.  You can always go to [facility] as well.  That is a walk-in urgent care in [place] and they do not need an appointment for. - The other thing that you may consider is results physical therapy in [place].  You do not need an appointment there either.     ED Prescriptions     Medication Sig Dispense Auth. Provider   meloxicam (MOBIC) 15 MG tablet Take 1 tablet (15 mg total) by mouth daily. 30 tablet Gretta Cool      PDMP not reviewed this encounter.   Danton Clap, PA-C 03/17/22 1106

## 2022-03-17 NOTE — ED Triage Notes (Signed)
Patient c/o Right shoulder pain -- pain started about a month ago.  He started that he went to work this AM and felt something "pop".   Patient stated that PCP gave him 4 shots in the shoulder -- Tuesday of last week.

## 2022-04-01 ENCOUNTER — Ambulatory Visit: Payer: Medicare Other | Attending: Family Medicine | Admitting: Physical Therapy

## 2022-04-01 ENCOUNTER — Encounter: Payer: Self-pay | Admitting: Physical Therapy

## 2022-04-01 DIAGNOSIS — M6281 Muscle weakness (generalized): Secondary | ICD-10-CM | POA: Diagnosis present

## 2022-04-01 DIAGNOSIS — M25511 Pain in right shoulder: Secondary | ICD-10-CM | POA: Insufficient documentation

## 2022-04-01 DIAGNOSIS — M25611 Stiffness of right shoulder, not elsewhere classified: Secondary | ICD-10-CM | POA: Insufficient documentation

## 2022-04-01 NOTE — Therapy (Signed)
OUTPATIENT PHYSICAL THERAPY SHOULDER/UPPER QUARTER EVALUATION   Patient Name: Shawn Atkins MRN: [id number] DOB:1956/04/22, 66 y.o., male Today's Date: 04/02/2022   PT End of Session - 04/01/22 1452     Visit Number 1    Number of Visits 17    Date for PT Re-Evaluation 05/27/22    Authorization Type UHC Medicare, VL based on medical necessity    Progress Note Due on Visit 10    PT Start Time 1500    PT Stop Time 1550    PT Time Calculation (min) 50 min    Activity Tolerance Patient tolerated treatment well    Behavior During Therapy WFL for tasks assessed/performed             Past Medical History:  Diagnosis Date   BPH (benign prostatic hypertrophy)    Bronchitis    Chronic prostatitis    Complication of anesthesia    Felt like "couldn't breathe" after triple Hernia surgery   Diabetes mellitus without complication (HCC)    Flank pain    Frequency    Gross hematuria    HTN (hypertension)    Inguinal hernia    Kidney stones    stones   Nocturia    Overweight    Spermatocele    Stricture of urethra    Vertigo    1 episode, 6-7 yrs ago   Past Surgical History:  Procedure Laterality Date   CATARACT EXTRACTION W/PHACO Left 07/22/2020   Procedure: CATARACT EXTRACTION PHACO AND INTRAOCULAR LENS PLACEMENT ([place]) LEFT DIABETIC 4.14  00:33.0;  Surgeon: Eulogio Bear, MD;  Location: [place];  Service: Ophthalmology;  Laterality: Left;  Diabetic - oral meds   CATARACT EXTRACTION W/PHACO Right 08/12/2020   Procedure: CATARACT EXTRACTION PHACO AND INTRAOCULAR LENS PLACEMENT ([place]) RIGHT DIABETIC;  Surgeon: Eulogio Bear, MD;  Location: [place];  Service: Ophthalmology;  Laterality: Right;  1.99 0:26.2   COLONOSCOPY WITH PROPOFOL N/A 11/20/2015   Procedure: COLONOSCOPY WITH PROPOFOL;  Surgeon: Christene Lye, MD;  Location: [facility];  Service: Endoscopy;  Laterality: N/A;   HERNIA REPAIR Bilateral 9030   umbilical and bil inguinal/  Dr Marina Gravel   JOINT REPLACEMENT     knee   KIDNEY STONE SURGERY     open lithotomy     Patient Active Problem List   Diagnosis Date Noted   History of rectal bleeding 06/01/2018   Left groin pain 03/18/2018   Sacroiliac joint pain 01/13/2017   Abdominal pain, left lower quadrant 08/23/2015   BPH with obstruction/lower urinary tract symptoms 08/23/2015   Biceps tendinitis 06/25/2015    PCP: Lynnell Jude, MD  REFERRING PROVIDER: Lynnell Jude, MD  REFERRING DIAG:  M54.2 (ICD-10-CM) - Neck pain  M25.519 (ICD-10-CM) - Shoulder pain    THERAPY DIAG:  Acute pain of right shoulder  Stiffness of right shoulder, not elsewhere classified  Muscle weakness (generalized)  Rationale for Evaluation and Treatment Rehabilitation  ONSET DATE: 02/15/22  SUBJECTIVE:  SUBJECTIVE STATEMENT: Patient is a 66 year old male with atraumatic right shoulder pain since late April 2023  PERTINENT HISTORY: Patient is a 66 year old male with atraumatic right shoulder pain since late April 2023; denies any specific injury, but 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  He has to lift heavy trash bags and throw them over his shoulders into a dumpster. He reports getting injections in 4 different regions in his shoulder that did not help. He does not remember a specific instance in which this pain began.  Patient reports intermittent popping in his shoulder that is painful. Patient reports dull ache radiating down R arm down to his elbow. Negative radiographs. Patient reports pain down to his forearm, no wrist pain or hand/digit pain; no numbness/tingling. Patient reports intermittent disturbed sleep that improves with position change. Patient reports history of notable neck strain about 20 years ago, no hardware  in neck. Pt was given ER precautions of neck, and PA at hospital documented possible partial rotator cuff tear versus cervical radiculitis/radiculopathy.   PAIN:  Are you having pain? Yes: NPRS scale: 5/10 Pain location: began in neck/R upper trapezius, middle deltoid region and down to elbow  Pain description: dull, ache Aggravating factors: overhead reaching, reaching out in front, heavier lifting Relieving factors: ice, Tylenol, anti-inflammatory medication  PRECAUTIONS: None  WEIGHT BEARING RESTRICTIONS No  FALLS:  Has patient fallen in last 6 months? No    OCCUPATION: Works at [employer] 4 hours/day, retired from his full-time work. Patient has to clean areas of the store, wiping mirrors and cleaning surfaces. Pt has to lift trash bags and take to dumpster   PLOF: Independent  PATIENT GOALS: Pain-free, able to go fishing, playing with grandkids, able to work at [employer] part time   OBJECTIVE:   DIAGNOSTIC FINDINGS:  X-rays of R shoulder: negative  PATIENT SURVEYS:  FOTO 48, predicted goal score of 64  COGNITION:  Overall cognitive status: Within functional limits for tasks assessed     SENSATION: Not tested  POSTURE: Forward head, protracted cervical spine; rounded shoulders and increased thoracic kyphosis    UPPER EXTREMITY ROM:   Active ROM Right eval Left eval  Shoulder flexion 67* 143  Shoulder extension 63 55  Shoulder abduction 77* crepitus  127  Shoulder adduction    Shoulder internal rotation    Shoulder external rotation    Elbow flexion WNL WNL  Elbow extension WNL* (pain with straight arm) WNL  Wrist flexion    Wrist extension    Wrist ulnar deviation    Wrist radial deviation    Wrist pronation    Wrist supination    (Blank rows = not tested) *Indicates pain  Passive ROM Right eval Left eval  Shoulder flexion 150* (pain end-range)   Shoulder extension    Shoulder abduction 140* (pain end-range)   Shoulder adduction    Shoulder  internal rotation 70   Shoulder external rotation 80* (pain end-range)   (Blank rows = not tested) *Indicates pain   UPPER EXTREMITY MMT:  MMT Right eval Left eval  Shoulder flexion 2 4+  Shoulder extension    Shoulder abduction 2 4+  Shoulder adduction    Shoulder internal rotation    Shoulder external rotation 4* 5  Middle trapezius    Lower trapezius    Elbow flexion    Elbow extension    Wrist flexion    Wrist extension    Wrist ulnar deviation    Wrist  radial deviation    Wrist pronation    Wrist supination    Grip strength (lbs)    (Blank rows = not tested)  Cervical Spine Screen Spurlings: R Negative, L Negative Cervical compression/distraction: Negative   Cervical spine AROM Cervical flexion:  WNL Cervical extension: Moderate motion loss* Lateral flexion: Right WNL , Left WNL Cervical rotation: Right WNL, Left WNL (Overpressure performed with all motions, no concordant sign reproduction) *Indicates pain   SHOULDER SPECIAL TESTS:  SLAP lesions: Biceps load test: positive , Speed test: Positive   Rotator cuff assessment: Drop arm test: positive , Empty can test: pt unable to assume test position;  Full can test: Empty can test: pt unable to assume test position, and Infraspinatus test: Positive for pain reproduction    JOINT MOBILITY TESTING:  Glenohumeral joint: Unable to fully assess due to pain with grade 1 GHJ gliding in any direction today  PALPATION:  Tenderness to palpation along R levator scapulae 1+, R supraspinatus 2+, R infraspinatus 2+, R posterior and middle deltoid 1+, R bicipital groove 3+    TODAY'S TREATMENT:   Therapeutic Exercise - for HEP establishment, discussion on appropriate exercise/activity modification, PT education  Reviewed baseline home exercises (pendulums and scapular retractions, continued anti-inflammatory work with prescribed meds and use of ice at home) and provided handout for [place] program (see Access Code);  tactile cueing and therapist demonstration utilized as needed for carryover of proper technique to HEP.      PATIENT EDUCATION: Education details: see above Person educated: Patient Education method: Explanation, Demonstration, and Handouts Education comprehension: verbalized understanding and returned demonstration   HOME EXERCISE PROGRAM: Access Code [email or web address]   ASSESSMENT:  CLINICAL IMPRESSION: Patient is a 66 y.o. male who was seen today for physical therapy evaluation and treatment for right shoulder pain. Clinical presentation is consistent with referring diagnosis of rotator cuff tear (partial RTC tear per referring provider). Patient has current activity limitations in reaching, lifting, carrying, and utilizing cleaning equipment and steering wheel with his RUE as needed for completing work duties and getting to work and appointments. He presents with impairments in decreased R shoulder AROM>PROM, decreased deltoid and RTC strength, postural changes (upper crossed presentation), R shoulder pain with moderate referral to R lateral arm and R lateral elbow. Pt prognosis is enhanced by pt motivation. It is diminished by physical demand of work duties, advanced age, comorbidities. Pt will benefit from continued skilled PT intervention to address the noted impairments and activity limitations for best return to PLOF.    OBJECTIVE IMPAIRMENTS decreased ROM, decreased strength, impaired UE functional use, postural dysfunction, and pain.   ACTIVITY LIMITATIONS carrying, lifting, dressing, and reach over head  PARTICIPATION LIMITATIONS: occupation (part time work at [employer]), playing with grandchildren, and fishing  [place] Age, Past/current experiences, Profession, and 3+ comorbidities: (Type 2 DM, HTN, overweight)  are also affecting patient's functional outcome.   REHAB POTENTIAL: Good  CLINICAL DECISION MAKING: Evolving/moderate complexity  EVALUATION COMPLEXITY:  Moderate   GOALS:  SHORT TERM GOALS: Target date: 04/23/2022   Patient will be independent and compliant with given HEP as needed for augmenting in-clinic PT intervention to improve shoulder mobility and upper limb function Baseline: 04/01/22: Baseline HEP initiated Goal status: INITIAL  2.  Patient will improve shoulder flexion and abduction AROM within 10 degrees of contralateral upper extremity indicative of improved shoulder elevation ROM as needed for overhead and forward reaching and completion of his work duties and household ADLs Baseline: 04/01/22: Shoulder AROM  Flexion R 67, L 143; ABD R 77, L 127 Goal status: INITIAL   LONG TERM GOALS: Target date: 05/28/2022    Patient will demonstrate improved function as evidenced by a score of 64 on FOTO measure for full participation in activities at home and in the community.  Baseline: 04/01/22: 48 Goal status: INITIAL  2.  Patient will have MMT 4+/5 or greater for all tested shoulder motions indicative of improved strength as needed for lifting and carrying duties needed for his job Baseline: 04/01/22: MMT R shoulder flexion 2, abduction 2, ER 4 Goal status: INITIAL  3.  Patient will improve NPRS by 3 points or greater indicative of clinically significant change in pain status as needed for participation in household and work duties at [employer] Baseline: 04/01/22: NPRS 5/10 Goal status: INITIAL  4.  Patient will perform functional carry and lifting task simulating taking out trash at work as needed for full participation in his work duties Baseline: 04/01/22: Significant pain and limitation with taking out trash Goal status: INITIAL   PLAN: PT FREQUENCY: 2x/week  PT DURATION: 8 weeks  PLANNED INTERVENTIONS: Therapeutic exercises, Therapeutic activity, Neuromuscular re-education, Balance training, Gait training, Patient/Family education, Joint mobilization, Dry Needling, Cryotherapy, Moist heat, and Manual therapy  PLAN FOR NEXT SESSION:  Gentle shoulder ROM work, starting session with pendulums and progressing to dowel-assisted ROM, assess further for MMT pending pain/hyperalgesia present, submaximal RTC isometrics as able   Valentina Gu, PT, DPT #[id number]  Eilleen Kempf, PT 04/02/2022, 11:07 AM

## 2022-04-06 ENCOUNTER — Ambulatory Visit: Payer: Medicare Other | Admitting: Physical Therapy

## 2022-04-06 ENCOUNTER — Encounter: Payer: Self-pay | Admitting: Physical Therapy

## 2022-04-06 DIAGNOSIS — M25511 Pain in right shoulder: Secondary | ICD-10-CM

## 2022-04-06 DIAGNOSIS — M25611 Stiffness of right shoulder, not elsewhere classified: Secondary | ICD-10-CM

## 2022-04-06 DIAGNOSIS — M6281 Muscle weakness (generalized): Secondary | ICD-10-CM

## 2022-04-06 NOTE — Therapy (Unsigned)
OUTPATIENT PHYSICAL THERAPY TREATMENT NOTE   Patient Name: Shawn Atkins MRN: [id number] DOB:[birth year], 66 y.o., male Today's Date: 04/08/2022  PCP: Lynnell Jude, MD REFERRING PROVIDER: Lynnell Jude, MD  END OF SESSION:   PT End of Session - 04/08/22 0951     Visit Number 2    Number of Visits 17    Date for PT Re-Evaluation 05/27/22    Authorization Type UHC Medicare, VL based on medical necessity    Progress Note Due on Visit 10    PT Start Time 1547    PT Stop Time 1637    PT Time Calculation (min) 50 min    Activity Tolerance Patient tolerated treatment well;Patient limited by pain    Behavior During Therapy WFL for tasks assessed/performed             Past Medical History:  Diagnosis Date   BPH (benign prostatic hypertrophy)    Bronchitis    Chronic prostatitis    Complication of anesthesia    Felt like "couldn't breathe" after triple Hernia surgery   Diabetes mellitus without complication ([place])    Flank pain    Frequency    Gross hematuria    HTN (hypertension)    Inguinal hernia    Kidney stones    stones   Nocturia    Overweight    Spermatocele    Stricture of urethra    Vertigo    1 episode, 6-7 yrs ago   Past Surgical History:  Procedure Laterality Date   CATARACT EXTRACTION W/PHACO Left 07/22/2020   Procedure: CATARACT EXTRACTION PHACO AND INTRAOCULAR LENS PLACEMENT ([place]) LEFT DIABETIC 4.14  00:33.0;  Surgeon: Eulogio Bear, MD;  Location: [place];  Service: Ophthalmology;  Laterality: Left;  Diabetic - oral meds   CATARACT EXTRACTION W/PHACO Right 08/12/2020   Procedure: CATARACT EXTRACTION PHACO AND INTRAOCULAR LENS PLACEMENT ([place]) RIGHT DIABETIC;  Surgeon: Eulogio Bear, MD;  Location: [place];  Service: Ophthalmology;  Laterality: Right;  1.99 0:26.2   COLONOSCOPY WITH PROPOFOL N/A 11/20/2015   Procedure: COLONOSCOPY WITH PROPOFOL;  Surgeon: Christene Lye, MD;  Location: [facility];  Service:  Endoscopy;  Laterality: N/A;   HERNIA REPAIR Bilateral 4287   umbilical and bil inguinal/ Dr Marina Gravel   JOINT REPLACEMENT     knee   KIDNEY STONE SURGERY     open lithotomy     Patient Active Problem List   Diagnosis Date Noted   History of rectal bleeding 06/01/2018   Left groin pain 03/18/2018   Sacroiliac joint pain 01/13/2017   Abdominal pain, left lower quadrant 08/23/2015   BPH with obstruction/lower urinary tract symptoms 08/23/2015   Biceps tendinitis 06/25/2015    REFERRING DIAG:  M54.2 (ICD-10-CM) - Neck pain  M25.519 (ICD-10-CM) - Shoulder pain    THERAPY DIAG:  Acute pain of right shoulder  Stiffness of right shoulder, not elsewhere classified  Muscle weakness (generalized)  Rationale for Evaluation and Treatment Rehabilitation  PERTINENT HISTORY: Patient is a 66 year old male with atraumatic right shoulder pain since late April 2023; denies any specific injury, but 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  He has to lift heavy trash bags and throw them over his shoulders into a dumpster. He reports getting injections in 4 different regions in his shoulder that did not help. He does not remember a specific instance in which this pain began.  Patient reports intermittent popping in his shoulder that is  painful. Patient reports dull ache radiating down R arm down to his elbow. Negative radiographs. Patient reports pain down to his forearm, no wrist pain or hand/digit pain; no numbness/tingling. Patient reports intermittent disturbed sleep that improves with position change. Patient reports history of notable neck strain about 20 years ago, no hardware in neck. Pt was given ER precautions of neck, and PA at hospital documented possible partial rotator cuff tear versus cervical radiculitis/radiculopathy.   PRECAUTIONS: None    OBJECTIVE (objective information is from initial evaluation 04/01/22 unless otherwise stated):    DIAGNOSTIC FINDINGS:   X-rays of R shoulder: negative   PATIENT SURVEYS:  FOTO 48, predicted goal score of 64   COGNITION:           Overall cognitive status: Within functional limits for tasks assessed                                  SENSATION: Not tested   POSTURE: Forward head, protracted cervical spine; rounded shoulders and increased thoracic kyphosis       UPPER EXTREMITY ROM:    Active ROM Right eval Left eval  Shoulder flexion 67* 143  Shoulder extension 63 55  Shoulder abduction 77* crepitus  127  Shoulder adduction      Shoulder internal rotation      Shoulder external rotation      Elbow flexion WNL WNL  Elbow extension WNL* (pain with straight arm) WNL  Wrist flexion      Wrist extension      Wrist ulnar deviation      Wrist radial deviation      Wrist pronation      Wrist supination      (Blank rows = not tested) *Indicates pain   Passive ROM Right eval Left eval  Shoulder flexion 150* (pain end-range)    Shoulder extension      Shoulder abduction 140* (pain end-range)    Shoulder adduction      Shoulder internal rotation 70    Shoulder external rotation 80* (pain end-range)    (Blank rows = not tested) *Indicates pain     UPPER EXTREMITY MMT:   MMT Right eval Left eval  Shoulder flexion 2 4+  Shoulder extension      Shoulder abduction 2 4+  Shoulder adduction      Shoulder internal rotation      Shoulder external rotation 4* 5  Middle trapezius      Lower trapezius      Elbow flexion      Elbow extension      Wrist flexion      Wrist extension      Wrist ulnar deviation      Wrist radial deviation      Wrist pronation      Wrist supination      Grip strength (lbs)      (Blank rows = not tested)   Cervical Spine Screen Spurlings: R Negative, L Negative Cervical compression/distraction: Negative    Cervical spine AROM Cervical flexion:  WNL Cervical extension: Moderate motion loss* Lateral flexion: Right WNL , Left WNL Cervical rotation:  Right WNL, Left WNL (Overpressure performed with all motions, no concordant sign reproduction) *Indicates pain     SHOULDER SPECIAL TESTS:            SLAP lesions: Biceps load test: positive , Speed test: Positive  Rotator cuff assessment: Drop arm test: positive , Empty can test: pt unable to assume test position;  Full can test: Empty can test: pt unable to assume test position, and Infraspinatus test: Positive for pain reproduction               JOINT MOBILITY TESTING:  Glenohumeral joint: Unable to fully assess due to pain with grade 1 GHJ gliding in any direction today   PALPATION:  Tenderness to palpation along R levator scapulae 1+, R supraspinatus 2+, R infraspinatus 2+, R posterior and middle deltoid 1+, R bicipital groove 3+               TODAY'S TREATMENT:    SUBJECTIVE: Patient reports significant soreness after initial evaluation and states he has ongoing shoulder pain with limitations c reaching out in front and overhead. Patient is compliant with his home program and reports tolerating initial exercises well.   PAIN:  Are you having pain? Yes: NPRS scale: 2-3/10 Pain location: R shoulder/proximal arm Pain description: dull ache Aggravating factors: overhead reaching, reaching out in front, heavier lifting Relieving factors: ice, Tylenol, anti-inflammatory medication   Manual Therapy - for symptom modulation, soft tissue sensitivity and mobility, joint mobility, ROM   (All performed in supine lying) STM anterior and middle deltoid  Gentle gr I-II glenohumeral joint mobilizations in loose-packed position A-P  STM/TPR R levator scapulae  Pain reproduction and crepitus with inferior glide  Gentle shoulder ER/IR alternating isometrics with arm at side, 90 deg elbow flexion; 1 x 2 minutes     Therapeutic Exercise - for improved soft tissue flexibility and extensibility as needed for ROM  Pendulum; x30 forward-backward in staggered stance with L upper  limb holding edge of table  Dynamic levator scapulae stretch, seated; 20x, 1 sec brief hold, no UE pull, looking toward armpit only  (discomfort in low back with attempted 30-sec static stretch and difficulty using L shoulder to pull head into stretch)  ER/IR isometrics at wall; x10, 5 sec, with towel roll; heavy cueing for intensity of force applied and avoidance of putting body weight onto arm  Patient education: HEP update and review   Cold pack (unbilled) - for anti-inflammatory and analgesic effect as needed for reduced pain and improved ability to participate in active PT intervention, utilized post-session with cold pack draped along R shoulder in sitting, x 5 minutes      PATIENT EDUCATION: Education details: see above Person educated: Patient Education method: Consulting civil engineer, Media planner, and Handouts Education comprehension: verbalized understanding and returned demonstration     HOME EXERCISE PROGRAM: Access Code [email or web address]     ASSESSMENT:   CLINICAL IMPRESSION: Patient has ongoing significant R shoulder pain and upper limb mobility deficits. No notable neck pain at this time. Pt has experienced paresthesias before, but is now reporting dull, achy pain in R shoulder with moderate referral to R arm and R elbow. Patient does have difficulty with exercise techniques given L shoulder pain and mobility deficits (of lesser severity) and comorbid back pain. Pt will benefit from continued skilled PT intervention to address the noted impairments and activity limitations for best return to PLOF.      OBJECTIVE IMPAIRMENTS decreased ROM, decreased strength, impaired UE functional use, postural dysfunction, and pain.    ACTIVITY LIMITATIONS carrying, lifting, dressing, and reach over head   PARTICIPATION LIMITATIONS: occupation (part time work at [employer]), playing with grandchildren, and fishing   [place] Age, Past/current experiences, Profession, and 3+ comorbidities:  (Type 2  DM, HTN, overweight)  are also affecting patient's functional outcome.    REHAB POTENTIAL: Good   CLINICAL DECISION MAKING: Evolving/moderate complexity   EVALUATION COMPLEXITY: Moderate     GOALS:   SHORT TERM GOALS: Target date: 04/23/2022    Patient will be independent and compliant with given HEP as needed for augmenting in-clinic PT intervention to improve shoulder mobility and upper limb function Baseline: 04/01/22: Baseline HEP initiated Goal status: INITIAL   2.  Patient will improve shoulder flexion and abduction AROM within 10 degrees of contralateral upper extremity indicative of improved shoulder elevation ROM as needed for overhead and forward reaching and completion of his work duties and household ADLs Baseline: 04/01/22: Shoulder AROM Flexion R 67, L 143; ABD R 77, L 127 Goal status: INITIAL     LONG TERM GOALS: Target date: 05/28/2022     Patient will demonstrate improved function as evidenced by a score of 64 on FOTO measure for full participation in activities at home and in the community.  Baseline: 04/01/22: 48 Goal status: INITIAL   2.  Patient will have MMT 4+/5 or greater for all tested shoulder motions indicative of improved strength as needed for lifting and carrying duties needed for his job Baseline: 04/01/22: MMT R shoulder flexion 2, abduction 2, ER 4 Goal status: INITIAL   3.  Patient will improve NPRS by 3 points or greater indicative of clinically significant change in pain status as needed for participation in household and work duties at [employer] Baseline: 04/01/22: NPRS 5/10 Goal status: INITIAL   4.  Patient will perform functional carry and lifting task simulating taking out trash at work as needed for full participation in his work duties Baseline: 04/01/22: Significant pain and limitation with taking out trash Goal status: INITIAL     PLAN: PT FREQUENCY: 2x/week   PT DURATION: 8 weeks   PLANNED INTERVENTIONS: Therapeutic exercises,  Therapeutic activity, Neuromuscular re-education, Balance training, Gait training, Patient/Family education, Joint mobilization, Dry Needling, Cryotherapy, Moist heat, and Manual therapy   PLAN FOR NEXT SESSION: Gentle shoulder ROM work, starting session with pendulums and progressing to dowel-assisted ROM, assess further for MMT pending pain/hyperalgesia present, submaximal RTC isometrics as able     Valentina Gu, PT, DPT #[id number]  Eilleen Kempf, PT 04/08/2022, 10:06 AM

## 2022-04-08 ENCOUNTER — Ambulatory Visit: Payer: Medicare Other | Admitting: Physical Therapy

## 2022-04-08 DIAGNOSIS — M6281 Muscle weakness (generalized): Secondary | ICD-10-CM

## 2022-04-08 DIAGNOSIS — M25511 Pain in right shoulder: Secondary | ICD-10-CM

## 2022-04-08 DIAGNOSIS — M25611 Stiffness of right shoulder, not elsewhere classified: Secondary | ICD-10-CM

## 2022-04-08 NOTE — Therapy (Signed)
OUTPATIENT PHYSICAL THERAPY TREATMENT NOTE   Patient Name: Shawn Atkins MRN: [id number] DOB:Jun 11, 1956, 66 y.o., male Today's Date: 04/11/2022  PCP: Lynnell Jude, MD REFERRING PROVIDER: Lynnell Jude, MD  END OF SESSION:   PT End of Session - 04/11/22 2325     Visit Number 3    Number of Visits 17    Date for PT Re-Evaluation 05/27/22    Authorization Type [employer] Medicare, VL based on medical necessity    Progress Note Due on Visit 10    PT Start Time 1547    PT Stop Time 1630    PT Time Calculation (min) 43 min    Activity Tolerance Patient tolerated treatment well;Patient limited by pain    Behavior During Therapy WFL for tasks assessed/performed             Past Medical History:  Diagnosis Date   BPH (benign prostatic hypertrophy)    Bronchitis    Chronic prostatitis    Complication of anesthesia    Felt like "couldn't breathe" after triple Hernia surgery   Diabetes mellitus without complication ([place])    Flank pain    Frequency    Gross hematuria    HTN (hypertension)    Inguinal hernia    Kidney stones    stones   Nocturia    Overweight    Spermatocele    Stricture of urethra    Vertigo    1 episode, 6-7 yrs ago   Past Surgical History:  Procedure Laterality Date   CATARACT EXTRACTION W/PHACO Left 07/22/2020   Procedure: CATARACT EXTRACTION PHACO AND INTRAOCULAR LENS PLACEMENT ([place]) LEFT DIABETIC 4.14  00:33.0;  Surgeon: Eulogio Bear, MD;  Location: [place];  Service: Ophthalmology;  Laterality: Left;  Diabetic - oral meds   CATARACT EXTRACTION W/PHACO Right 08/12/2020   Procedure: CATARACT EXTRACTION PHACO AND INTRAOCULAR LENS PLACEMENT ([place]) RIGHT DIABETIC;  Surgeon: Eulogio Bear, MD;  Location: [place];  Service: Ophthalmology;  Laterality: Right;  1.99 0:26.2   COLONOSCOPY WITH PROPOFOL N/A 11/20/2015   Procedure: COLONOSCOPY WITH PROPOFOL;  Surgeon: Christene Lye, MD;  Location: [facility];  Service:  Endoscopy;  Laterality: N/A;   HERNIA REPAIR Bilateral 1093   umbilical and bil inguinal/ Dr Marina Gravel   JOINT REPLACEMENT     knee   KIDNEY STONE SURGERY     open lithotomy     Patient Active Problem List   Diagnosis Date Noted   History of rectal bleeding 06/01/2018   Left groin pain 03/18/2018   Sacroiliac joint pain 01/13/2017   Abdominal pain, left lower quadrant 08/23/2015   BPH with obstruction/lower urinary tract symptoms 08/23/2015   Biceps tendinitis 06/25/2015       REFERRING DIAG:  M54.2 (ICD-10-CM) - Neck pain  M25.519 (ICD-10-CM) - Shoulder pain      THERAPY DIAG:  Acute pain of right shoulder   Stiffness of right shoulder, not elsewhere classified   Muscle weakness (generalized)   Rationale for Evaluation and Treatment Rehabilitation   PERTINENT HISTORY: Patient is a 66 year old male with atraumatic right shoulder pain since late April 2023; denies any specific injury, but 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  He has to lift heavy trash bags and throw them over his shoulders into a dumpster. He reports getting injections in 4 different regions in his shoulder that did not help. He does not remember a specific instance in which this pain began.  Patient reports intermittent popping in his shoulder that is painful. Patient reports dull ache radiating down R arm down to his elbow. Negative radiographs. Patient reports pain down to his forearm, no wrist pain or hand/digit pain; no numbness/tingling. Patient reports intermittent disturbed sleep that improves with position change. Patient reports history of notable neck strain about 20 years ago, no hardware in neck. Pt was given ER precautions of neck, and PA at hospital documented possible partial rotator cuff tear versus cervical radiculitis/radiculopathy.    PRECAUTIONS: None       OBJECTIVE (objective information is from initial evaluation 04/01/22 unless otherwise stated):    DIAGNOSTIC  FINDINGS:  X-rays of R shoulder: negative   PATIENT SURVEYS:  FOTO 48, predicted goal score of 64   COGNITION:           Overall cognitive status: Within functional limits for tasks assessed                                  SENSATION: Not tested   POSTURE: Forward head, protracted cervical spine; rounded shoulders and increased thoracic kyphosis       UPPER EXTREMITY ROM:    Active ROM Right eval Left eval  Shoulder flexion 67* 143  Shoulder extension 63 55  Shoulder abduction 77* crepitus  127  Shoulder adduction      Shoulder internal rotation      Shoulder external rotation      Elbow flexion WNL WNL  Elbow extension WNL* (pain with straight arm) WNL  Wrist flexion      Wrist extension      Wrist ulnar deviation      Wrist radial deviation      Wrist pronation      Wrist supination      (Blank rows = not tested) *Indicates pain   Passive ROM Right eval Left eval  Shoulder flexion 150* (pain end-range)    Shoulder extension      Shoulder abduction 140* (pain end-range)    Shoulder adduction      Shoulder internal rotation 70    Shoulder external rotation 80* (pain end-range)    (Blank rows = not tested) *Indicates pain     UPPER EXTREMITY MMT:   MMT Right eval Left eval  Shoulder flexion 2 4+  Shoulder extension      Shoulder abduction 2 4+  Shoulder adduction      Shoulder internal rotation      Shoulder external rotation 4* 5  Middle trapezius      Lower trapezius      Elbow flexion      Elbow extension      Wrist flexion      Wrist extension      Wrist ulnar deviation      Wrist radial deviation      Wrist pronation      Wrist supination      Grip strength (lbs)      (Blank rows = not tested)   Cervical Spine Screen Spurlings: R Negative, L Negative Cervical compression/distraction: Negative    Cervical spine AROM Cervical flexion:  WNL Cervical extension: Moderate motion loss* Lateral flexion: Right WNL , Left WNL Cervical  rotation: Right WNL, Left WNL (Overpressure performed with all motions, no concordant sign reproduction) *Indicates pain     SHOULDER SPECIAL TESTS:            SLAP lesions: Biceps  load test: positive , Speed test: Positive             Rotator cuff assessment: Drop arm test: positive , Empty can test: pt unable to assume test position;  Full can test: Empty can test: pt unable to assume test position, and Infraspinatus test: Positive for pain reproduction               JOINT MOBILITY TESTING:  Glenohumeral joint: Unable to fully assess due to pain with grade 1 GHJ gliding in any direction today   PALPATION:  Tenderness to palpation along R levator scapulae 1+, R supraspinatus 2+, R infraspinatus 2+, R posterior and middle deltoid 1+, R bicipital groove 3+               TODAY'S TREATMENT:    SUBJECTIVE: Patient reports mild dull ache at arrival to PT. Patient reports intermittently having radiating pain as far as R forearm earlier this week, but not presently. Pt is compliant with HEP.      PAIN:  Are you having pain? Yes: NPRS scale: 2-3/10 Pain location: R shoulder/proximal arm Pain description: dull ache Aggravating factors: overhead reaching, reaching out in front, heavier lifting Relieving factors: ice, Tylenol, anti-inflammatory medication     Manual Therapy - for symptom modulation, soft tissue sensitivity and mobility, joint mobility, ROM    (All performed in supine lying) STM anterior and middle deltoid  Gentle gr I-II glenohumeral joint mobilizations in loose-packed position A-P, gentle gr I-II inferior glide   STM/TPR R levator scapulae   Attempted rhythmic stabilization at 90 deg forward elevation with PT assisting patient to 90 deg elevation to start; pt reports notable difficulty maintaining position and reproduction of R shoulder/proximal arm pain.   Gentle shoulder ER/IR alternating isometrics with arm at side, 90 deg elbow flexion; 1 x 2 minutes       Therapeutic Exercise - for improved soft tissue flexibility and extensibility as needed for ROM   Pendulum; x30 forward-backward in staggered stance with L upper limb holding edge of table   Dynamic levator scapulae stretch, seated; 20x, 1 sec brief hold, no UE pull, looking toward armpit only  (discomfort in low back with attempted 30-sec static stretch and difficulty using L shoulder to pull head into stretch)   Wand ER; supine; 2x10  Wand flexion in supine attempted; difficulty with return to neutral and painful arc; stopped after 5 reps  Reactive isometrics in standing, ER/IR walkout with Red Tband; x10 each direction   Patient education: HEP update and review     Cold pack (unbilled) - for anti-inflammatory and analgesic effect as needed for reduced pain and improved ability to participate in active PT intervention, utilized post-session with cold pack draped along R shoulder in supine during manual therapy, x 5 minutes     *not today* ER/IR isometrics at wall; x10, 5 sec, with towel roll; heavy cueing for intensity of force applied and avoidance of putting body weight onto arm      PATIENT EDUCATION: Education details: see above Person educated: Patient Education method: Consulting civil engineer, Demonstration, and Handouts Education comprehension: verbalized understanding and returned demonstration     HOME EXERCISE PROGRAM: Access Code [email or web address]     ASSESSMENT:   CLINICAL IMPRESSION: Patient does have improved tolerance of shoulder mobility work this evening with PROM and glenohumeral external rotation AAROM. He fortunately does have lower NPRS with respite from his physical work duties at [employer].  However, he is still limited with tolerance of  forward elevation as exhibited with intolerance of active-assisted forward flexion with dowel this afternoon.Patient does still have significantly decreased functional use of RUE and pain affecting contralateral shoulder due to using L  arm for most of his work. Patient needs continued work on pain modulation, gradual restoration of ROM, and continued isometrics for promoting RTC soft tissue healing. Pt will benefit from continued skilled PT intervention to address the noted impairments and activity limitations for best return to PLOF.      OBJECTIVE IMPAIRMENTS decreased ROM, decreased strength, impaired UE functional use, postural dysfunction, and pain.    ACTIVITY LIMITATIONS carrying, lifting, dressing, and reach over head   PARTICIPATION LIMITATIONS: occupation (part time work at [employer]), playing with grandchildren, and fishing   [place] Age, Past/current experiences, Profession, and 3+ comorbidities: (Type 2 DM, HTN, overweight)  are also affecting patient's functional outcome.    REHAB POTENTIAL: Good   CLINICAL DECISION MAKING: Evolving/moderate complexity   EVALUATION COMPLEXITY: Moderate     GOALS:   SHORT TERM GOALS: Target date: 04/23/2022    Patient will be independent and compliant with given HEP as needed for augmenting in-clinic PT intervention to improve shoulder mobility and upper limb function Baseline: 04/01/22: Baseline HEP initiated Goal status: INITIAL   2.  Patient will improve shoulder flexion and abduction AROM within 10 degrees of contralateral upper extremity indicative of improved shoulder elevation ROM as needed for overhead and forward reaching and completion of his work duties and household ADLs Baseline: 04/01/22: Shoulder AROM Flexion R 67, L 143; ABD R 77, L 127 Goal status: INITIAL     LONG TERM GOALS: Target date: 05/28/2022     Patient will demonstrate improved function as evidenced by a score of 64 on FOTO measure for full participation in activities at home and in the community.  Baseline: 04/01/22: 48 Goal status: INITIAL   2.  Patient will have MMT 4+/5 or greater for all tested shoulder motions indicative of improved strength as needed for lifting and carrying duties  needed for his job Baseline: 04/01/22: MMT R shoulder flexion 2, abduction 2, ER 4 Goal status: INITIAL   3.  Patient will improve NPRS by 3 points or greater indicative of clinically significant change in pain status as needed for participation in household and work duties at [employer] Baseline: 04/01/22: NPRS 5/10 Goal status: INITIAL   4.  Patient will perform functional carry and lifting task simulating taking out trash at work as needed for full participation in his work duties Baseline: 04/01/22: Significant pain and limitation with taking out trash Goal status: INITIAL     PLAN: PT FREQUENCY: 2x/week   PT DURATION: 8 weeks   PLANNED INTERVENTIONS: Therapeutic exercises, Therapeutic activity, Neuromuscular re-education, Balance training, Gait training, Patient/Family education, Joint mobilization, Dry Needling, Cryotherapy, Moist heat, and Manual therapy   PLAN FOR NEXT SESSION: Gentle shoulder ROM work, starting session with pendulums and progressing to dowel-assisted ROM, assess further for MMT pending pain/hyperalgesia present, submaximal RTC isometrics as able     Valentina Gu, PT, DPT #[id number]  Eilleen Kempf, PT 04/11/2022, 11:26 PM

## 2022-04-11 ENCOUNTER — Encounter: Payer: Self-pay | Admitting: Physical Therapy

## 2022-04-13 ENCOUNTER — Encounter: Payer: Self-pay | Admitting: Physical Therapy

## 2022-04-13 ENCOUNTER — Ambulatory Visit: Payer: Medicare Other | Admitting: Physical Therapy

## 2022-04-13 DIAGNOSIS — M6281 Muscle weakness (generalized): Secondary | ICD-10-CM

## 2022-04-13 DIAGNOSIS — M25511 Pain in right shoulder: Secondary | ICD-10-CM

## 2022-04-13 DIAGNOSIS — M25611 Stiffness of right shoulder, not elsewhere classified: Secondary | ICD-10-CM

## 2022-04-13 NOTE — Therapy (Signed)
OUTPATIENT PHYSICAL THERAPY TREATMENT NOTE   Patient Name: Shawn Atkins MRN: [id number] DOB:09/22/1956, 66 y.o., male Today's Date: 04/14/2022  PCP: Lynnell Jude, MD REFERRING PROVIDER: Lynnell Jude  END OF SESSION:   PT End of Session - 04/13/22 1610     Visit Number 4    Number of Visits 17    Date for PT Re-Evaluation 05/27/22    Authorization Type [employer] Medicare, VL based on medical necessity    Progress Note Due on Visit 10    PT Start Time 1547    PT Stop Time 1628    PT Time Calculation (min) 41 min    Activity Tolerance Patient tolerated treatment well;Patient limited by pain    Behavior During Therapy WFL for tasks assessed/performed             Past Medical History:  Diagnosis Date   BPH (benign prostatic hypertrophy)    Bronchitis    Chronic prostatitis    Complication of anesthesia    Felt like "couldn't breathe" after triple Hernia surgery   Diabetes mellitus without complication (HCC)    Flank pain    Frequency    Gross hematuria    HTN (hypertension)    Inguinal hernia    Kidney stones    stones   Nocturia    Overweight    Spermatocele    Stricture of urethra    Vertigo    1 episode, 6-7 yrs ago   Past Surgical History:  Procedure Laterality Date   CATARACT EXTRACTION W/PHACO Left 07/22/2020   Procedure: CATARACT EXTRACTION PHACO AND INTRAOCULAR LENS PLACEMENT ([place]) LEFT DIABETIC 4.14  00:33.0;  Surgeon: Eulogio Bear, MD;  Location: [place];  Service: Ophthalmology;  Laterality: Left;  Diabetic - oral meds   CATARACT EXTRACTION W/PHACO Right 08/12/2020   Procedure: CATARACT EXTRACTION PHACO AND INTRAOCULAR LENS PLACEMENT ([place]) RIGHT DIABETIC;  Surgeon: Eulogio Bear, MD;  Location: [place];  Service: Ophthalmology;  Laterality: Right;  1.99 0:26.2   COLONOSCOPY WITH PROPOFOL N/A 11/20/2015   Procedure: COLONOSCOPY WITH PROPOFOL;  Surgeon: Christene Lye, MD;  Location: [facility];  Service:  Endoscopy;  Laterality: N/A;   HERNIA REPAIR Bilateral 9326   umbilical and bil inguinal/ Dr Marina Gravel   JOINT REPLACEMENT     knee   KIDNEY STONE SURGERY     open lithotomy     Patient Active Problem List   Diagnosis Date Noted   History of rectal bleeding 06/01/2018   Left groin pain 03/18/2018   Sacroiliac joint pain 01/13/2017   Abdominal pain, left lower quadrant 08/23/2015   BPH with obstruction/lower urinary tract symptoms 08/23/2015   Biceps tendinitis 06/25/2015    REFERRING DIAG:  M54.2 (ICD-10-CM) - Neck pain  M25.519 (ICD-10-CM) - Shoulder pain      THERAPY DIAG:  Acute pain of right shoulder   Stiffness of right shoulder, not elsewhere classified   Muscle weakness (generalized)   Rationale for Evaluation and Treatment Rehabilitation   PERTINENT HISTORY: Patient is a 66 year old male with atraumatic right shoulder pain since late April 2023; denies any specific injury, but 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  He has to lift heavy trash bags and throw them over his shoulders into a dumpster. He reports getting injections in 4 different regions in his shoulder that did not help. He does not remember a specific instance in which this pain began.  Patient reports intermittent popping  in his shoulder that is painful. Patient reports dull ache radiating down R arm down to his elbow. Negative radiographs. Patient reports pain down to his forearm, no wrist pain or hand/digit pain; no numbness/tingling. Patient reports intermittent disturbed sleep that improves with position change. Patient reports history of notable neck strain about 20 years ago, no hardware in neck. Pt was given ER precautions of neck, and PA at hospital documented possible partial rotator cuff tear versus cervical radiculitis/radiculopathy.    PRECAUTIONS: None       OBJECTIVE (objective information is from initial evaluation 04/01/22 unless otherwise stated):    DIAGNOSTIC  FINDINGS:  X-rays of R shoulder: negative   PATIENT SURVEYS:  FOTO 48, predicted goal score of 64   COGNITION:           Overall cognitive status: Within functional limits for tasks assessed                                  SENSATION: Not tested   POSTURE: Forward head, protracted cervical spine; rounded shoulders and increased thoracic kyphosis       UPPER EXTREMITY ROM:    Active ROM Right eval Left eval  Shoulder flexion 67* 143  Shoulder extension 63 55  Shoulder abduction 77* crepitus  127  Shoulder adduction      Shoulder internal rotation      Shoulder external rotation      Elbow flexion WNL WNL  Elbow extension WNL* (pain with straight arm) WNL  Wrist flexion      Wrist extension      Wrist ulnar deviation      Wrist radial deviation      Wrist pronation      Wrist supination      (Blank rows = not tested) *Indicates pain   Passive ROM Right eval Left eval  Shoulder flexion 150* (pain end-range)    Shoulder extension      Shoulder abduction 140* (pain end-range)    Shoulder adduction      Shoulder internal rotation 70    Shoulder external rotation 80* (pain end-range)    (Blank rows = not tested) *Indicates pain     UPPER EXTREMITY MMT:   MMT Right eval Left eval  Shoulder flexion 2 4+  Shoulder extension      Shoulder abduction 2 4+  Shoulder adduction      Shoulder internal rotation      Shoulder external rotation 4* 5  Middle trapezius      Lower trapezius      Elbow flexion      Elbow extension      Wrist flexion      Wrist extension      Wrist ulnar deviation      Wrist radial deviation      Wrist pronation      Wrist supination      Grip strength (lbs)      (Blank rows = not tested)   Cervical Spine Screen Spurlings: R Negative, L Negative Cervical compression/distraction: Negative    Cervical spine AROM Cervical flexion:  WNL Cervical extension: Moderate motion loss* Lateral flexion: Right WNL , Left WNL Cervical  rotation: Right WNL, Left WNL (Overpressure performed with all motions, no concordant sign reproduction) *Indicates pain     SHOULDER SPECIAL TESTS:            SLAP lesions: Biceps load test: positive ,  Speed test: Positive             Rotator cuff assessment: Drop arm test: positive , Empty can test: pt unable to assume test position;  Full can test: Empty can test: pt unable to assume test position, and Infraspinatus test: Positive for pain reproduction               JOINT MOBILITY TESTING:  Glenohumeral joint: Unable to fully assess due to pain with grade 1 GHJ gliding in any direction today   PALPATION:  Tenderness to palpation along R levator scapulae 1+, R supraspinatus 2+, R infraspinatus 2+, R posterior and middle deltoid 1+, R bicipital groove 3+                TODAY'S TREATMENT:    SUBJECTIVE: Patient reports up to 4-7/10 pain yesterday. Patient reports pain in his arm; started from his neck and radiated down to his R forearm. Patient reports pain has decreased since yesterday. Pt denies recent paresthesias. Pt is still taking hiatus from work at [employer].      PAIN:  Are you having pain? Yes: NPRS scale: 3/10 Pain location: R shoulder/proximal arm Pain description: dull ache Aggravating factors: overhead reaching, reaching out in front, heavier lifting Relieving factors: ice, Tylenol, anti-inflammatory medication      Manual Therapy - for symptom modulation, soft tissue sensitivity and mobility, joint mobility, ROM   Trial of general cervical spine traction in supine x 3 minutes, 10 sec on, 10 sec off; no neck pain during, residual R shoulder/proximal arm pain is unchanged. Increase in neck pain upon completion of traction   (All performed in supine lying) STM anterior and middle deltoid  Gentle gr I-II glenohumeral joint mobilizations in loose-packed position A-P, gentle gr I-II inferior glide   STM/TPR R levator scapulae and R upper trapezius    Gentle shoulder  ER/IR alternating isometrics with arm at side, 90 deg elbow flexion; 1 x 2 minutes    *not today* Attempted rhythmic stabilization at 90 deg forward elevation with PT assisting patient to 90 deg elevation to start; pt reports notable difficulty maintaining position and reproduction of R shoulder/proximal arm pain.     Therapeutic Exercise - for improved soft tissue flexibility and extensibility as needed for ROM  Supine cervical retraction trial to check for centralization/peripheralization; pain in neck during, increase in pain after with no change in location of symptoms; performed x 10   Dynamic upper trapezius stretch, seated; 10x, 1 sec brief hold, stopped early due to neck pain    Wand ER; supine; 2x10  Table slide, flexion; x10, 5 sec hold; ROM within limits of pain   Reactive isometrics in standing, ER/IR walkout with Red Tband; x10 each direction        Cold pack (unbilled) - for anti-inflammatory and analgesic effect as needed for reduced pain and improved ability to participate in active PT intervention, utilized post-session with cold pack draped along R shoulder in supine during manual therapy, x 5 minutes       *not today* Dynamic levator scapulae stretch, seated; 20x, 1 sec brief hold, no UE pull, looking toward armpit only  (discomfort in low back with attempted 30-sec static stretch and difficulty using L shoulder to pull head into stretch) Wand flexion in supine attempted; difficulty with return to neutral and painful arc; stopped after 5 reps Pendulum; x30 forward-backward in staggered stance with L upper limb holding edge of table ER/IR isometrics at wall; x10, 5  sec, with towel roll; heavy cueing for intensity of force applied and avoidance of putting body weight onto arm       PATIENT EDUCATION: Education details: see above Person educated: Patient Education method: Consulting civil engineer, Demonstration, and Handouts Education comprehension: verbalized understanding  and returned demonstration     HOME EXERCISE PROGRAM: Access Code [place]     ASSESSMENT:   CLINICAL IMPRESSION: Patient does have significant difficulty with participation in PT this evening given notable shoulder and comorbid R-sided neck pain. Patient has negative cervical spine screen from his eval for reproduction of his concordant pain. Patient has equivocal responses with cervical spine repeated movements and attempted general traction in supine; increase in pain with both of these interventions and no effect on peripheral symptoms. Mechanism of injury is consistent with peripheral shoulder injury versus flare-up of cervical radiculopathy or cervical spine referred pain. Patient has been able to tolerate RTC isometrics in office well, but he does have onset of pain toward end of ER reactive isometrics today. Patient does have lower NPRS at arrival compared to outset of PT, but he still has significant limitation with functional use of R upper limb. Patient needs continued work on pain modulation, gradual restoration of ROM, and continued isometrics for promoting RTC soft tissue healing. Pt will benefit from continued skilled PT intervention to address the noted impairments and activity limitations for best return to PLOF.      OBJECTIVE IMPAIRMENTS decreased ROM, decreased strength, impaired UE functional use, postural dysfunction, and pain.    ACTIVITY LIMITATIONS carrying, lifting, dressing, and reach over head   PARTICIPATION LIMITATIONS: occupation (part time work at [employer]), playing with grandchildren, and fishing   [place] Age, Past/current experiences, Profession, and 3+ comorbidities: (Type 2 DM, HTN, overweight)  are also affecting patient's functional outcome.    REHAB POTENTIAL: Good   CLINICAL DECISION MAKING: Evolving/moderate complexity   EVALUATION COMPLEXITY: Moderate     GOALS:   SHORT TERM GOALS: Target date: 04/23/2022    Patient will be  independent and compliant with given HEP as needed for augmenting in-clinic PT intervention to improve shoulder mobility and upper limb function Baseline: 04/01/22: Baseline HEP initiated Goal status: INITIAL   2.  Patient will improve shoulder flexion and abduction AROM within 10 degrees of contralateral upper extremity indicative of improved shoulder elevation ROM as needed for overhead and forward reaching and completion of his work duties and household ADLs Baseline: 04/01/22: Shoulder AROM Flexion R 67, L 143; ABD R 77, L 127 Goal status: INITIAL     LONG TERM GOALS: Target date: 05/28/2022     Patient will demonstrate improved function as evidenced by a score of 64 on FOTO measure for full participation in activities at home and in the community.  Baseline: 04/01/22: 48 Goal status: INITIAL   2.  Patient will have MMT 4+/5 or greater for all tested shoulder motions indicative of improved strength as needed for lifting and carrying duties needed for his job Baseline: 04/01/22: MMT R shoulder flexion 2, abduction 2, ER 4 Goal status: INITIAL   3.  Patient will improve NPRS by 3 points or greater indicative of clinically significant change in pain status as needed for participation in household and work duties at [employer] Baseline: 04/01/22: NPRS 5/10 Goal status: INITIAL   4.  Patient will perform functional carry and lifting task simulating taking out trash at work as needed for full participation in his work duties Baseline: 04/01/22: Significant pain and limitation  with taking out trash Goal status: INITIAL     PLAN: PT FREQUENCY: 2x/week   PT DURATION: 8 weeks   PLANNED INTERVENTIONS: Therapeutic exercises, Therapeutic activity, Neuromuscular re-education, Balance training, Gait training, Patient/Family education, Joint mobilization, Dry Needling, Cryotherapy, Moist heat, and Manual therapy   PLAN FOR NEXT SESSION: Gentle shoulder ROM work, progressing shoulder AAROM as tolerated,  submaximal RTC isometrics as able, initiate gentle periscapular isotonics next visit.     Valentina Gu, PT, DPT #[id number]  Eilleen Kempf, PT 04/14/2022, 4:27 PM

## 2022-04-15 ENCOUNTER — Encounter: Payer: Self-pay | Admitting: Physical Therapy

## 2022-04-15 ENCOUNTER — Ambulatory Visit: Payer: Medicare Other | Admitting: Physical Therapy

## 2022-04-15 DIAGNOSIS — M25511 Pain in right shoulder: Secondary | ICD-10-CM

## 2022-04-15 DIAGNOSIS — M6281 Muscle weakness (generalized): Secondary | ICD-10-CM

## 2022-04-15 DIAGNOSIS — M25611 Stiffness of right shoulder, not elsewhere classified: Secondary | ICD-10-CM

## 2022-04-15 NOTE — Therapy (Incomplete)
OUTPATIENT PHYSICAL THERAPY TREATMENT NOTE   Patient Name: Shawn Atkins MRN: [id number] DOB:07-30-1956, 66 y.o., male Today's Date: 04/15/2022  PCP: Marland Kitchen REFERRING PROVIDER: ***  END OF SESSION:   PT End of Session - 04/15/22 1605     Visit Number 5    Number of Visits 17    Date for PT Re-Evaluation 05/27/22    Authorization Type [employer] Medicare, VL based on medical necessity    Progress Note Due on Visit 10    PT Start Time 1547    PT Stop Time 1630    PT Time Calculation (min) 43 min    Activity Tolerance Patient tolerated treatment well;Patient limited by pain    Behavior During Therapy WFL for tasks assessed/performed             Past Medical History:  Diagnosis Date   BPH (benign prostatic hypertrophy)    Bronchitis    Chronic prostatitis    Complication of anesthesia    Felt like "couldn't breathe" after triple Hernia surgery   Diabetes mellitus without complication (HCC)    Flank pain    Frequency    Gross hematuria    HTN (hypertension)    Inguinal hernia    Kidney stones    stones   Nocturia    Overweight    Spermatocele    Stricture of urethra    Vertigo    1 episode, 6-7 yrs ago   Past Surgical History:  Procedure Laterality Date   CATARACT EXTRACTION W/PHACO Left 07/22/2020   Procedure: CATARACT EXTRACTION PHACO AND INTRAOCULAR LENS PLACEMENT ([place]) LEFT DIABETIC 4.14  00:33.0;  Surgeon: Eulogio Bear, MD;  Location: [facility];  Service: Ophthalmology;  Laterality: Left;  Diabetic - oral meds   CATARACT EXTRACTION W/PHACO Right 08/12/2020   Procedure: CATARACT EXTRACTION PHACO AND INTRAOCULAR LENS PLACEMENT ([place]) RIGHT DIABETIC;  Surgeon: Eulogio Bear, MD;  Location: [facility];  Service: Ophthalmology;  Laterality: Right;  1.99 0:26.2   COLONOSCOPY WITH PROPOFOL N/A 11/20/2015   Procedure: COLONOSCOPY WITH PROPOFOL;  Surgeon: Christene Lye, MD;  Location: [facility];  Service: Endoscopy;  Laterality: N/A;    HERNIA REPAIR Bilateral 7494   umbilical and bil inguinal/ Dr Marina Gravel   JOINT REPLACEMENT     knee   KIDNEY STONE SURGERY     open lithotomy     Patient Active Problem List   Diagnosis Date Noted   History of rectal bleeding 06/01/2018   Left groin pain 03/18/2018   Sacroiliac joint pain 01/13/2017   Abdominal pain, left lower quadrant 08/23/2015   BPH with obstruction/lower urinary tract symptoms 08/23/2015   Biceps tendinitis 06/25/2015     REFERRING DIAG:  M54.2 (ICD-10-CM) - Neck pain  M25.519 (ICD-10-CM) - Shoulder pain      THERAPY DIAG:  Acute pain of right shoulder   Stiffness of right shoulder, not elsewhere classified   Muscle weakness (generalized)   Rationale for Evaluation and Treatment Rehabilitation   PERTINENT HISTORY: Patient is a 66 year old male with atraumatic right shoulder pain since late April 2023; denies any specific injury, but 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  He has to lift heavy trash bags and throw them over his shoulders into a dumpster. He reports getting injections in 4 different regions in his shoulder that did not help. He does not remember a specific instance in which this pain began.  Patient reports intermittent popping in his shoulder that  is painful. Patient reports dull ache radiating down R arm down to his elbow. Negative radiographs. Patient reports pain down to his forearm, no wrist pain or hand/digit pain; no numbness/tingling. Patient reports intermittent disturbed sleep that improves with position change. Patient reports history of notable neck strain about 20 years ago, no hardware in neck. Pt was given ER precautions of neck, and PA at hospital documented possible partial rotator cuff tear versus cervical radiculitis/radiculopathy.    PRECAUTIONS: None       OBJECTIVE (objective information is from initial evaluation 04/01/22 unless otherwise stated):    DIAGNOSTIC FINDINGS:  X-rays of R shoulder:  negative   PATIENT SURVEYS:  FOTO 48, predicted goal score of 64   COGNITION:           Overall cognitive status: Within functional limits for tasks assessed                                  SENSATION: Not tested   POSTURE: Forward head, protracted cervical spine; rounded shoulders and increased thoracic kyphosis       UPPER EXTREMITY ROM:    Active ROM Right eval Left eval  Shoulder flexion 67* 143  Shoulder extension 63 55  Shoulder abduction 77* crepitus  127  Shoulder adduction      Shoulder internal rotation      Shoulder external rotation      Elbow flexion WNL WNL  Elbow extension WNL* (pain with straight arm) WNL  Wrist flexion      Wrist extension      Wrist ulnar deviation      Wrist radial deviation      Wrist pronation      Wrist supination      (Blank rows = not tested) *Indicates pain   Passive ROM Right eval Left eval  Shoulder flexion 150* (pain end-range)    Shoulder extension      Shoulder abduction 140* (pain end-range)    Shoulder adduction      Shoulder internal rotation 70    Shoulder external rotation 80* (pain end-range)    (Blank rows = not tested) *Indicates pain     UPPER EXTREMITY MMT:   MMT Right eval Left eval  Shoulder flexion 2 4+  Shoulder extension      Shoulder abduction 2 4+  Shoulder adduction      Shoulder internal rotation      Shoulder external rotation 4* 5  Middle trapezius      Lower trapezius      Elbow flexion      Elbow extension      Wrist flexion      Wrist extension      Wrist ulnar deviation      Wrist radial deviation      Wrist pronation      Wrist supination      Grip strength (lbs)      (Blank rows = not tested)   Cervical Spine Screen Spurlings: R Negative, L Negative Cervical compression/distraction: Negative    Cervical spine AROM Cervical flexion:  WNL Cervical extension: Moderate motion loss* Lateral flexion: Right WNL , Left WNL Cervical rotation: Right WNL, Left  WNL (Overpressure performed with all motions, no concordant sign reproduction) *Indicates pain     SHOULDER SPECIAL TESTS:            SLAP lesions: Biceps load test: positive , Speed test: Positive  Rotator cuff assessment: Drop arm test: positive , Empty can test: pt unable to assume test position;  Full can test: Empty can test: pt unable to assume test position, and Infraspinatus test: Positive for pain reproduction               JOINT MOBILITY TESTING:  Glenohumeral joint: Unable to fully assess due to pain with grade 1 GHJ gliding in any direction today   PALPATION:  Tenderness to palpation along R levator scapulae 1+, R supraspinatus 2+, R infraspinatus 2+, R posterior and middle deltoid 1+, R bicipital groove 3+                 TODAY'S TREATMENT:    SUBJECTIVE: Patient reports minimal pain at arrival. He reports doing well today, mild discomfort affecting R paracervical region. He reports doing well with resumption of isometrics yesterday.      PAIN:  Are you having pain? Yes: NPRS scale: 2/10 Pain location: R shoulder/proximal arm Pain description: dull ache Aggravating factors: overhead reaching, reaching out in front, heavier lifting Relieving factors: ice, Tylenol, anti-inflammatory medication       Manual Therapy - for symptom modulation, soft tissue sensitivity and mobility, joint mobility, ROM      (All performed in supine lying) STM anterior and middle deltoid  Gentle gr I-II glenohumeral joint mobilizations in loose-packed position A-P, gentle gr I-II inferior glide   STM R UT    Gentle shoulder ER/IR alternating isometrics with arm at side, 90 deg elbow flexion; 1 x 2 minutes     *not today* Attempted rhythmic stabilization at 90 deg forward elevation with PT assisting patient to 90 deg elevation to start; pt reports notable difficulty maintaining position and reproduction of R shoulder/proximal arm pain.  Trial of general cervical spine  traction in supine x 3 minutes, 10 sec on, 10 sec off;     Therapeutic Exercise - for improved soft tissue flexibility and extensibility as needed for ROM   Wand ER; supine; 2x10   Table slide, flexion; x10, 5 sec hold; ROM within limits of pain   Reactive isometrics in standing, ER/IR walkout with Red Tband; 2x10 each direction         Cold pack (unbilled) - for anti-inflammatory and analgesic effect as needed for reduced pain and improved ability to participate in active PT intervention, utilized post-session with cold pack draped along R shoulder in supine during manual therapy, x 5 minutes       *not today* Dynamic upper trapezius stretch, seated; 10x, 1 sec brief hold, stopped early due to neck pain  Supine cervical retraction trial to check for centralization/peripheralization; pain in neck during, increase in pain after with no change in location of symptoms; performed x 10  Dynamic levator scapulae stretch, seated; 20x, 1 sec brief hold, no UE pull, looking toward armpit only  (discomfort in low back with attempted 30-sec static stretch and difficulty using L shoulder to pull head into stretch) Wand flexion in supine attempted; difficulty with return to neutral and painful arc; stopped after 5 reps Pendulum; x30 forward-backward in staggered stance with L upper limb holding edge of table ER/IR isometrics at wall; x10, 5 sec, with towel roll; heavy cueing for intensity of force applied and avoidance of putting body weight onto arm       PATIENT EDUCATION: Education details: see above Person educated: Patient Education method: Explanation, Demonstration, and Handouts Education comprehension: verbalized understanding and returned demonstration     HOME EXERCISE  PROGRAM: Access Code [email or web address]     ASSESSMENT:   CLINICAL IMPRESSION: Patient does have significant difficulty with participation in PT this evening given notable shoulder and comorbid R-sided neck pain. Patient  has negative cervical spine screen from his eval for reproduction of his concordant pain. Patient has equivocal responses with cervical spine repeated movements and attempted general traction in supine; increase in pain with both of these interventions and no effect on peripheral symptoms. Mechanism of injury is consistent with peripheral shoulder injury versus flare-up of cervical radiculopathy or cervical spine referred pain. Patient has been able to tolerate RTC isometrics in office well, but he does have onset of pain toward end of ER reactive isometrics today. Patient does have lower NPRS at arrival compared to outset of PT, but he still has significant limitation with functional use of R upper limb. Patient needs continued work on pain modulation, gradual restoration of ROM, and continued isometrics for promoting RTC soft tissue healing. Pt will benefit from continued skilled PT intervention to address the noted impairments and activity limitations for best return to PLOF.      OBJECTIVE IMPAIRMENTS decreased ROM, decreased strength, impaired UE functional use, postural dysfunction, and pain.    ACTIVITY LIMITATIONS carrying, lifting, dressing, and reach over head   PARTICIPATION LIMITATIONS: occupation (part time work at [employer]), playing with grandchildren, and fishing   [place] Age, Past/current experiences, Profession, and 3+ comorbidities: (Type 2 DM, HTN, overweight)  are also affecting patient's functional outcome.    REHAB POTENTIAL: Good   CLINICAL DECISION MAKING: Evolving/moderate complexity   EVALUATION COMPLEXITY: Moderate     GOALS:   SHORT TERM GOALS: Target date: 04/23/2022    Patient will be independent and compliant with given HEP as needed for augmenting in-clinic PT intervention to improve shoulder mobility and upper limb function Baseline: 04/01/22: Baseline HEP initiated Goal status: INITIAL   2.  Patient will improve shoulder flexion and abduction AROM  within 10 degrees of contralateral upper extremity indicative of improved shoulder elevation ROM as needed for overhead and forward reaching and completion of his work duties and household ADLs Baseline: 04/01/22: Shoulder AROM Flexion R 67, L 143; ABD R 77, L 127 Goal status: INITIAL     LONG TERM GOALS: Target date: 05/28/2022     Patient will demonstrate improved function as evidenced by a score of 64 on FOTO measure for full participation in activities at home and in the community.  Baseline: 04/01/22: 48 Goal status: INITIAL   2.  Patient will have MMT 4+/5 or greater for all tested shoulder motions indicative of improved strength as needed for lifting and carrying duties needed for his job Baseline: 04/01/22: MMT R shoulder flexion 2, abduction 2, ER 4 Goal status: INITIAL   3.  Patient will improve NPRS by 3 points or greater indicative of clinically significant change in pain status as needed for participation in household and work duties at [employer] Baseline: 04/01/22: NPRS 5/10 Goal status: INITIAL   4.  Patient will perform functional carry and lifting task simulating taking out trash at work as needed for full participation in his work duties Baseline: 04/01/22: Significant pain and limitation with taking out trash Goal status: INITIAL     PLAN: PT FREQUENCY: 2x/week   PT DURATION: 8 weeks   PLANNED INTERVENTIONS: Therapeutic exercises, Therapeutic activity, Neuromuscular re-education, Balance training, Gait training, Patient/Family education, Joint mobilization, Dry Needling, Cryotherapy, Moist heat, and Manual therapy   PLAN FOR NEXT SESSION:  Gentle shoulder ROM work, progressing shoulder AAROM as tolerated, submaximal RTC isometrics as able, initiate gentle periscapular isotonics next visit.      Valentina Gu, PT, DPT #[id number]   Eilleen Kempf, PT 04/14/2022, 4:27 PM          Eilleen Kempf, PT 04/15/2022, 4:15 PM

## 2022-04-20 ENCOUNTER — Encounter: Payer: Medicare Other | Admitting: Physical Therapy

## 2022-04-22 ENCOUNTER — Ambulatory Visit: Payer: Medicare Other | Admitting: Physical Therapy

## 2022-04-22 ENCOUNTER — Encounter: Payer: Self-pay | Admitting: Physical Therapy

## 2022-04-22 DIAGNOSIS — M6281 Muscle weakness (generalized): Secondary | ICD-10-CM

## 2022-04-22 DIAGNOSIS — M25611 Stiffness of right shoulder, not elsewhere classified: Secondary | ICD-10-CM

## 2022-04-22 DIAGNOSIS — M25511 Pain in right shoulder: Secondary | ICD-10-CM | POA: Diagnosis not present

## 2022-04-22 NOTE — Therapy (Signed)
OUTPATIENT PHYSICAL THERAPY TREATMENT NOTE   Patient Name: Shawn Atkins MRN: [id number] DOB:Aug 09, 1956, 66 y.o., male Today's Date: 04/22/2022  PCP: Lynnell Jude, MD REFERRING PROVIDER: Lynnell Jude, MD   END OF SESSION:   PT End of Session - 04/22/22 1550     Visit Number 6    Number of Visits 17    Date for PT Re-Evaluation 05/27/22    Authorization Type [employer] Medicare, VL based on medical necessity    Progress Note Due on Visit 10    PT Start Time 1548    PT Stop Time 1630    PT Time Calculation (min) 42 min    Activity Tolerance Patient tolerated treatment well;Patient limited by pain    Behavior During Therapy WFL for tasks assessed/performed             Past Medical History:  Diagnosis Date   BPH (benign prostatic hypertrophy)    Bronchitis    Chronic prostatitis    Complication of anesthesia    Felt like "couldn't breathe" after triple Hernia surgery   Diabetes mellitus without complication ([place])    Flank pain    Frequency    Gross hematuria    HTN (hypertension)    Inguinal hernia    Kidney stones    stones   Nocturia    Overweight    Spermatocele    Stricture of urethra    Vertigo    1 episode, 6-7 yrs ago   Past Surgical History:  Procedure Laterality Date   CATARACT EXTRACTION W/PHACO Left 07/22/2020   Procedure: CATARACT EXTRACTION PHACO AND INTRAOCULAR LENS PLACEMENT ([place]) LEFT DIABETIC 4.14  00:33.0;  Surgeon: Eulogio Bear, MD;  Location: [place];  Service: Ophthalmology;  Laterality: Left;  Diabetic - oral meds   CATARACT EXTRACTION W/PHACO Right 08/12/2020   Procedure: CATARACT EXTRACTION PHACO AND INTRAOCULAR LENS PLACEMENT ([place]) RIGHT DIABETIC;  Surgeon: Eulogio Bear, MD;  Location: [place];  Service: Ophthalmology;  Laterality: Right;  1.99 0:26.2   COLONOSCOPY WITH PROPOFOL N/A 11/20/2015   Procedure: COLONOSCOPY WITH PROPOFOL;  Surgeon: Christene Lye, MD;  Location: [facility];  Service:  Endoscopy;  Laterality: N/A;   HERNIA REPAIR Bilateral 7673   umbilical and bil inguinal/ Dr Marina Gravel   JOINT REPLACEMENT     knee   KIDNEY STONE SURGERY     open lithotomy     Patient Active Problem List   Diagnosis Date Noted   History of rectal bleeding 06/01/2018   Left groin pain 03/18/2018   Sacroiliac joint pain 01/13/2017   Abdominal pain, left lower quadrant 08/23/2015   BPH with obstruction/lower urinary tract symptoms 08/23/2015   Biceps tendinitis 06/25/2015      REFERRING DIAG:  M54.2 (ICD-10-CM) - Neck pain  M25.519 (ICD-10-CM) - Shoulder pain      THERAPY DIAG:  Acute pain of right shoulder   Stiffness of right shoulder, not elsewhere classified   Muscle weakness (generalized)   Rationale for Evaluation and Treatment Rehabilitation   PERTINENT HISTORY: Patient is a 66 year old male with atraumatic right shoulder pain since late April 2023; denies any specific injury, but 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  He has to lift heavy trash bags and throw them over his shoulders into a dumpster. He reports getting injections in 4 different regions in his shoulder that did not help. He does not remember a specific instance in which this pain began.  Patient reports intermittent popping in his shoulder that is painful. Patient reports dull ache radiating down R arm down to his elbow. Negative radiographs. Patient reports pain down to his forearm, no wrist pain or hand/digit pain; no numbness/tingling. Patient reports intermittent disturbed sleep that improves with position change. Patient reports history of notable neck strain about 20 years ago, no hardware in neck. Pt was given ER precautions of neck, and PA at hospital documented possible partial rotator cuff tear versus cervical radiculitis/radiculopathy.    PRECAUTIONS: None       OBJECTIVE (objective information is from initial evaluation 04/01/22 unless otherwise stated):    DIAGNOSTIC  FINDINGS:  X-rays of R shoulder: negative   PATIENT SURVEYS:  FOTO 48, predicted goal score of 64   COGNITION:           Overall cognitive status: Within functional limits for tasks assessed                                  SENSATION: Not tested   POSTURE: Forward head, protracted cervical spine; rounded shoulders and increased thoracic kyphosis       UPPER EXTREMITY ROM:    Active ROM Right eval Left eval  Shoulder flexion 67* 143  Shoulder extension 63 55  Shoulder abduction 77* crepitus  127  Shoulder adduction      Shoulder internal rotation      Shoulder external rotation      Elbow flexion WNL WNL  Elbow extension WNL* (pain with straight arm) WNL  Wrist flexion      Wrist extension      Wrist ulnar deviation      Wrist radial deviation      Wrist pronation      Wrist supination      (Blank rows = not tested) *Indicates pain   Passive ROM Right eval Left eval  Shoulder flexion 150* (pain end-range)    Shoulder extension      Shoulder abduction 140* (pain end-range)    Shoulder adduction      Shoulder internal rotation 70    Shoulder external rotation 80* (pain end-range)    (Blank rows = not tested) *Indicates pain     UPPER EXTREMITY MMT:   MMT Right eval Left eval  Shoulder flexion 2 4+  Shoulder extension      Shoulder abduction 2 4+  Shoulder adduction      Shoulder internal rotation      Shoulder external rotation 4* 5  Middle trapezius      Lower trapezius      Elbow flexion      Elbow extension      Wrist flexion      Wrist extension      Wrist ulnar deviation      Wrist radial deviation      Wrist pronation      Wrist supination      Grip strength (lbs)      (Blank rows = not tested)   Cervical Spine Screen Spurlings: R Negative, L Negative Cervical compression/distraction: Negative    Cervical spine AROM Cervical flexion:  WNL Cervical extension: Moderate motion loss* Lateral flexion: Right WNL , Left WNL Cervical  rotation: Right WNL, Left WNL (Overpressure performed with all motions, no concordant sign reproduction) *Indicates pain     SHOULDER SPECIAL TESTS:            SLAP lesions: Biceps  load test: positive , Speed test: Positive             Rotator cuff assessment: Drop arm test: positive , Empty can test: pt unable to assume test position;  Full can test: Empty can test: pt unable to assume test position, and Infraspinatus test: Positive for pain reproduction               JOINT MOBILITY TESTING:  Glenohumeral joint: Unable to fully assess due to pain with grade 1 GHJ gliding in any direction today   PALPATION:  Tenderness to palpation along R levator scapulae 1+, R supraspinatus 2+, R infraspinatus 2+, R posterior and middle deltoid 1+, R bicipital groove 3+                 TODAY'S TREATMENT:    SUBJECTIVE: Patient reports minimal pain at arrival. Patient reports compliance with his HEP. Patient reports he can move his arm better versus previous weeks.      PAIN:  Are you having pain? Yes: NPRS scale: 1/10 Pain location: R shoulder/proximal arm Pain description: dull ache Aggravating factors: overhead reaching, reaching out in front, heavier lifting Relieving factors: ice, Tylenol, anti-inflammatory medication       Manual Therapy - for symptom modulation, soft tissue sensitivity and mobility, joint mobility, ROM      (All performed in supine lying) STM anterior and middle deltoid  Gentle gr I-II glenohumeral joint mobilizations in loose-packed position A-P, gentle gr I-II inferior glide   Gentle R shoulder PROM within pt tolerance with gentle oscillations through ROM to decrease sensitivity; flexion, ABD, ER, IR        -performed x 3 minutes    Rhythmic stabilization at 90 deg forward elevation; 2x30 sec      *not today* STM R UT Trial of general cervical spine traction in supine x 3 minutes, 10 sec on, 10 sec off;     Therapeutic Exercise - for improved soft tissue  flexibility and extensibility as needed for ROM   Wand ER; supine; 2x10    Sidelying external rotation; performed with 1-lb Dbell; 2x8 repetitions - verbal and tactile cueing for maintain arm at side versus in slight shoulder flexion and to maintain scapular retraction/depression  Serratus slide on wall for active forward elevation mobility with serratus activation; 2x10  Standing Tband Row, Blue Tband; tactile cueing for scapular depression with retraction; 2x10   Reactive isometrics in standing, ER/IR walkout with Red Tband; 2x10 each direction        *not today* Cold pack (unbilled) - for anti-inflammatory and analgesic effect as needed for reduced pain and improved ability to participate in active PT intervention, utilized post-session with cold pack draped along R shoulder in supine during manual therapy, x 5 minutes       *not today* Table slide, flexion; x10, 5 sec hold; ROM within limits of pain Dynamic upper trapezius stretch, seated; 10x, 1 sec brief hold, stopped early due to neck pain  Supine cervical retraction trial to check for centralization/peripheralization; pain in neck during, increase in pain after with no change in location of symptoms; performed x 10  Dynamic levator scapulae stretch, seated; 20x, 1 sec brief hold, no UE pull, looking toward armpit only  (discomfort in low back with attempted 30-sec static stretch and difficulty using L shoulder to pull head into stretch) Wand flexion in supine attempted; difficulty with return to neutral and painful arc; stopped after 5 reps Pendulum; x30 forward-backward in staggered stance with  L upper limb holding edge of table ER/IR isometrics at wall; x10, 5 sec, with towel roll; heavy cueing for intensity of force applied and avoidance of putting body weight onto arm       PATIENT EDUCATION: Education details: see above Person educated: Patient Education method: Consulting civil engineer, Demonstration, and Handouts Education  comprehension: verbalized understanding and returned demonstration     HOME EXERCISE PROGRAM: Access Code [email or web address]     ASSESSMENT:   CLINICAL IMPRESSION: Patient does have remaining R shoulder pain, though pain rating scale has significantly improved with brief unloading of R shoulder and continuing with anti-inflammatories and icing at home as well as gentle mobility work and isometrics. Pt tolerates gentle RTC loading and periscapular work much better this week and is progressing well following some difficulty participating in PT during previous weeks. Patient needs continued work on restoration of AROM, strength, and RUE functional use (performs moderate lifting, cleaning tasks, and taking out trash for 4-hour shifts at [employer]). Pt will benefit from continued skilled PT intervention to address the noted impairments and activity limitations for best return to PLOF.      OBJECTIVE IMPAIRMENTS decreased ROM, decreased strength, impaired UE functional use, postural dysfunction, and pain.    ACTIVITY LIMITATIONS carrying, lifting, dressing, and reach over head   PARTICIPATION LIMITATIONS: occupation (part time work at [employer]), playing with grandchildren, and fishing   [place] Age, Past/current experiences, Profession, and 3+ comorbidities: (Type 2 DM, HTN, overweight)  are also affecting patient's functional outcome.    REHAB POTENTIAL: Good   CLINICAL DECISION MAKING: Evolving/moderate complexity   EVALUATION COMPLEXITY: Moderate     GOALS:   SHORT TERM GOALS: Target date: 04/23/2022    Patient will be independent and compliant with given HEP as needed for augmenting in-clinic PT intervention to improve shoulder mobility and upper limb function Baseline: 04/01/22: Baseline HEP initiated Goal status: INITIAL   2.  Patient will improve shoulder flexion and abduction AROM within 10 degrees of contralateral upper extremity indicative of improved shoulder elevation ROM as  needed for overhead and forward reaching and completion of his work duties and household ADLs Baseline: 04/01/22: Shoulder AROM Flexion R 67, L 143; ABD R 77, L 127 Goal status: INITIAL     LONG TERM GOALS: Target date: 05/28/2022     Patient will demonstrate improved function as evidenced by a score of 64 on FOTO measure for full participation in activities at home and in the community.  Baseline: 04/01/22: 48 Goal status: INITIAL   2.  Patient will have MMT 4+/5 or greater for all tested shoulder motions indicative of improved strength as needed for lifting and carrying duties needed for his job Baseline: 04/01/22: MMT R shoulder flexion 2, abduction 2, ER 4 Goal status: INITIAL   3.  Patient will improve NPRS by 3 points or greater indicative of clinically significant change in pain status as needed for participation in household and work duties at [employer] Baseline: 04/01/22: NPRS 5/10 Goal status: INITIAL   4.  Patient will perform functional carry and lifting task simulating taking out trash at work as needed for full participation in his work duties Baseline: 04/01/22: Significant pain and limitation with taking out trash Goal status: INITIAL     PLAN: PT FREQUENCY: 2x/week   PT DURATION: 8 weeks   PLANNED INTERVENTIONS: Therapeutic exercises, Therapeutic activity, Neuromuscular re-education, Balance training, Gait training, Patient/Family education, Joint mobilization, Dry Needling, Cryotherapy, Moist heat, and Manual therapy   PLAN  FOR NEXT SESSION: Gentle shoulder ROM work, progressing shoulder AAROM as tolerated; continue with gradual progression of RTC strengthening and periscapular isotonics      Valentina Gu, PT, DPT #[id number]  Eilleen Kempf, PT 04/22/2022, 3:50 PM

## 2022-04-27 ENCOUNTER — Ambulatory Visit: Payer: Medicare Other | Admitting: Physical Therapy

## 2022-04-29 ENCOUNTER — Encounter: Payer: Medicare Other | Admitting: Physical Therapy

## 2022-05-04 ENCOUNTER — Ambulatory Visit: Payer: Medicare Other | Attending: Family Medicine | Admitting: Physical Therapy

## 2022-05-04 DIAGNOSIS — M6281 Muscle weakness (generalized): Secondary | ICD-10-CM | POA: Diagnosis present

## 2022-05-04 DIAGNOSIS — M25611 Stiffness of right shoulder, not elsewhere classified: Secondary | ICD-10-CM | POA: Diagnosis present

## 2022-05-04 DIAGNOSIS — M25511 Pain in right shoulder: Secondary | ICD-10-CM | POA: Diagnosis not present

## 2022-05-04 NOTE — Therapy (Unsigned)
OUTPATIENT PHYSICAL THERAPY TREATMENT NOTE   Patient Name: Shawn Atkins MRN: [id number] DOB:1955-11-29, 66 y.o., male Today's Date: 05/04/2022  PCP: Lynnell Jude, MD REFERRING PROVIDER: Lynnell Jude, MD  END OF SESSION:   PT End of Session - 05/04/22 1541     Visit Number 7    Number of Visits 17    Date for PT Re-Evaluation 05/27/22    Authorization Type [employer] Medicare, VL based on medical necessity    Progress Note Due on Visit 10    PT Start Time 1542    PT Stop Time 1625    PT Time Calculation (min) 43 min    Activity Tolerance Patient tolerated treatment well;Patient limited by pain    Behavior During Therapy WFL for tasks assessed/performed             Past Medical History:  Diagnosis Date   BPH (benign prostatic hypertrophy)    Bronchitis    Chronic prostatitis    Complication of anesthesia    Felt like "couldn't breathe" after triple Hernia surgery   Diabetes mellitus without complication ([place])    Flank pain    Frequency    Gross hematuria    HTN (hypertension)    Inguinal hernia    Kidney stones    stones   Nocturia    Overweight    Spermatocele    Stricture of urethra    Vertigo    1 episode, 6-7 yrs ago   Past Surgical History:  Procedure Laterality Date   CATARACT EXTRACTION W/PHACO Left 07/22/2020   Procedure: CATARACT EXTRACTION PHACO AND INTRAOCULAR LENS PLACEMENT ([place]) LEFT DIABETIC 4.14  00:33.0;  Surgeon: Eulogio Bear, MD;  Location: [facility];  Service: Ophthalmology;  Laterality: Left;  Diabetic - oral meds   CATARACT EXTRACTION W/PHACO Right 08/12/2020   Procedure: CATARACT EXTRACTION PHACO AND INTRAOCULAR LENS PLACEMENT ([place]) RIGHT DIABETIC;  Surgeon: Eulogio Bear, MD;  Location: [facility];  Service: Ophthalmology;  Laterality: Right;  1.99 0:26.2   COLONOSCOPY WITH PROPOFOL N/A 11/20/2015   Procedure: COLONOSCOPY WITH PROPOFOL;  Surgeon: Christene Lye, MD;  Location: [facility];  Service:  Endoscopy;  Laterality: N/A;   HERNIA REPAIR Bilateral 2585   umbilical and bil inguinal/ Dr Marina Gravel   JOINT REPLACEMENT     knee   KIDNEY STONE SURGERY     open lithotomy     Patient Active Problem List   Diagnosis Date Noted   History of rectal bleeding 06/01/2018   Left groin pain 03/18/2018   Sacroiliac joint pain 01/13/2017   Abdominal pain, left lower quadrant 08/23/2015   BPH with obstruction/lower urinary tract symptoms 08/23/2015   Biceps tendinitis 06/25/2015      REFERRING DIAG:  M54.2 (ICD-10-CM) - Neck pain  M25.519 (ICD-10-CM) - Shoulder pain      THERAPY DIAG:  Acute pain of right shoulder   Stiffness of right shoulder, not elsewhere classified   Muscle weakness (generalized)   Rationale for Evaluation and Treatment Rehabilitation   PERTINENT HISTORY: Patient is a 66 year old male with atraumatic right shoulder pain since late April 2023; denies any specific injury, but 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  He has to lift heavy trash bags and throw them over his shoulders into a dumpster. He reports getting injections in 4 different regions in his shoulder that did not help. He does not remember a specific instance in which this pain began.  Patient  reports intermittent popping in his shoulder that is painful. Patient reports dull ache radiating down R arm down to his elbow. Negative radiographs. Patient reports pain down to his forearm, no wrist pain or hand/digit pain; no numbness/tingling. Patient reports intermittent disturbed sleep that improves with position change. Patient reports history of notable neck strain about 20 years ago, no hardware in neck. Pt was given ER precautions of neck, and PA at hospital documented possible partial rotator cuff tear versus cervical radiculitis/radiculopathy.    PRECAUTIONS: None       OBJECTIVE (objective information is from initial evaluation 04/01/22 unless otherwise stated):    DIAGNOSTIC  FINDINGS:  X-rays of R shoulder: negative   PATIENT SURVEYS:  FOTO 48, predicted goal score of 64   COGNITION:           Overall cognitive status: Within functional limits for tasks assessed                                  SENSATION: Not tested   POSTURE: Forward head, protracted cervical spine; rounded shoulders and increased thoracic kyphosis       UPPER EXTREMITY ROM:    Active ROM Right eval Left eval Right 05/04/22  Shoulder flexion 67* 143 74  Shoulder extension 63 55 43  Shoulder abduction 77* crepitus  127 62  Shoulder adduction       Shoulder internal rotation       Shoulder external rotation       Elbow flexion WNL WNL WNL  Elbow extension WNL* (pain with straight arm) WNL WNL  Wrist flexion       Wrist extension       Wrist ulnar deviation       Wrist radial deviation       Wrist pronation       Wrist supination       (Blank rows = not tested) *Indicates pain   Passive ROM Right eval Left eval  Shoulder flexion 150* (pain end-range)    Shoulder extension      Shoulder abduction 140* (pain end-range)    Shoulder adduction      Shoulder internal rotation 70    Shoulder external rotation 80* (pain end-range)    (Blank rows = not tested) *Indicates pain     UPPER EXTREMITY MMT:   MMT Right eval Left eval  Shoulder flexion 2 4+  Shoulder extension      Shoulder abduction 2 4+  Shoulder adduction      Shoulder internal rotation      Shoulder external rotation 4* 5  Middle trapezius      Lower trapezius      Elbow flexion      Elbow extension      Wrist flexion      Wrist extension      Wrist ulnar deviation      Wrist radial deviation      Wrist pronation      Wrist supination      Grip strength (lbs)      (Blank rows = not tested)   Cervical Spine Screen Spurlings: R Negative, L Negative Cervical compression/distraction: Negative    Cervical spine AROM Cervical flexion:  WNL Cervical extension: Moderate motion loss* Lateral  flexion: Right WNL , Left WNL Cervical rotation: Right WNL, Left WNL (Overpressure performed with all motions, no concordant sign reproduction) *Indicates pain     SHOULDER SPECIAL  TESTS:            SLAP lesions: Biceps load test: positive , Speed test: Positive             Rotator cuff assessment: Drop arm test: positive , Empty can test: pt unable to assume test position;  Full can test: Empty can test: pt unable to assume test position, and Infraspinatus test: Positive for pain reproduction               JOINT MOBILITY TESTING:  Glenohumeral joint: Unable to fully assess due to pain with grade 1 GHJ gliding in any direction today   PALPATION:  Tenderness to palpation along R levator scapulae 1+, R supraspinatus 2+, R infraspinatus 2+, R posterior and middle deltoid 1+, R bicipital groove 3+                   TODAY'S TREATMENT:    SUBJECTIVE: Patient reports returning to work today and completing cleaning and taking out trash. He reports that he may not be making substantial progress at this time. Patient reports pain is 5/10 now - was higher earlier.       PAIN:  Are you having pain? Yes: NPRS scale: 5/10 Pain location: R shoulder/proximal arm Pain description: dull ache Aggravating factors: overhead reaching, reaching out in front, heavier lifting Relieving factors: ice, Tylenol, anti-inflammatory medication        Manual Therapy - for symptom modulation, soft tissue sensitivity and mobility, joint mobility, ROM    (All performed in supine lying) STM R anterior and middle deltoid, R supraspinatus, and R infraspinatus Gentle gr I-II glenohumeral joint mobilizations in loose-packed position A-P, gentle gr I-II inferior glide   Gentle R shoulder PROM within pt tolerance with gentle oscillations through ROM to decrease sensitivity; flexion, ABD, ER, IR        -performed x 2 minutes        *not today* Rhythmic stabilization at 90 deg forward elevation; 2x30 sec STM R  UT Trial of general cervical spine traction in supine x 3 minutes, 10 sec on, 10 sec off;     Cold pack (unbilled) - for anti-inflammatory and analgesic effect as needed for reduced pain and improved ability to participate in active PT intervention, utilized post-session with cold pack draped along R shoulder in sitting during electric stimulation, x 10 minutes    Premodulated E-stim - utilized for pain control to improve active participation in physical therapy;  2 channels - 1 along L ACJ down to L lateral arm at mid-length of humerus and 1 along L posterior cuff mm. Continuous cycle time, beat low 80 Hz, beat high 150 Hz, intensity: 12 V. Duration: 10 minutes     Therapeutic Exercise - for improved soft tissue flexibility and extensibility as needed for ROM    Swiss ball forward roll on table, Silver physioball; 2x10, with R hand fixed on top of ball   Wand ER; standing; 2x10   Reactive isometrics in standing, ER/IR walkout with Red Tband; 2x10 each direction     PATIENT EDUCATION: Discussed at length expectations with conservative management, prognosis, PT POC. Discussed current progress to date and difficulty with improving functional UE use. Discussed use of modalities for pain prn and discussion with referring provider if conservative intervention fails.       *not today*  Sidelying external rotation; performed with 1-lb Dbell; 2x8 repetitions - verbal and tactile cueing for maintain arm at side versus in slight shoulder flexion and  to maintain scapular retraction/depression Serratus slide on wall for active forward elevation mobility with serratus activation; 2x10  Standing Tband Row, Blue Tband; tactile cueing for scapular depression with retraction; 2x10 Table slide, flexion; x10, 5 sec hold; ROM within limits of pain Dynamic upper trapezius stretch, seated; 10x, 1 sec brief hold, stopped early due to neck pain  Supine cervical retraction trial to check for  centralization/peripheralization; pain in neck during, increase in pain after with no change in location of symptoms; performed x 10  Dynamic levator scapulae stretch, seated; 20x, 1 sec brief hold, no UE pull, looking toward armpit only  (discomfort in low back with attempted 30-sec static stretch and difficulty using L shoulder to pull head into stretch) Wand flexion in supine attempted; difficulty with return to neutral and painful arc; stopped after 5 reps Pendulum; x30 forward-backward in staggered stance with L upper limb holding edge of table ER/IR isometrics at wall; x10, 5 sec, with towel roll; heavy cueing for intensity of force applied and avoidance of putting body weight onto arm       PATIENT EDUCATION: Education details: see above Person educated: Patient Education method: Consulting civil engineer, Demonstration, and Handouts Education comprehension: verbalized understanding and returned demonstration     HOME EXERCISE PROGRAM: Access Code [email or web address]     ASSESSMENT:   CLINICAL IMPRESSION: Patient does have remaining R shoulder pain, though pain rating scale has significantly improved with brief unloading of R shoulder and continuing with anti-inflammatories and icing at home as well as gentle mobility work and isometrics. Pt tolerates gentle RTC loading and periscapular work much better this week and is progressing well following some difficulty participating in PT during previous weeks. Patient needs continued work on restoration of AROM, strength, and RUE functional use (performs moderate lifting, cleaning tasks, and taking out trash for 4-hour shifts at [employer]). Pt will benefit from continued skilled PT intervention to address the noted impairments and activity limitations for best return to PLOF.      OBJECTIVE IMPAIRMENTS decreased ROM, decreased strength, impaired UE functional use, postural dysfunction, and pain.    ACTIVITY LIMITATIONS carrying, lifting, dressing, and reach over  head   PARTICIPATION LIMITATIONS: occupation (part time work at [employer]), playing with grandchildren, and fishing   [place] Age, Past/current experiences, Profession, and 3+ comorbidities: (Type 2 DM, HTN, overweight)  are also affecting patient's functional outcome.    REHAB POTENTIAL: Good   CLINICAL DECISION MAKING: Evolving/moderate complexity   EVALUATION COMPLEXITY: Moderate     GOALS:   SHORT TERM GOALS: Target date: 04/23/2022    Patient will be independent and compliant with given HEP as needed for augmenting in-clinic PT intervention to improve shoulder mobility and upper limb function Baseline: 04/01/22: Baseline HEP initiated Goal status: INITIAL   2.  Patient will improve shoulder flexion and abduction AROM within 10 degrees of contralateral upper extremity indicative of improved shoulder elevation ROM as needed for overhead and forward reaching and completion of his work duties and household ADLs Baseline: 04/01/22: Shoulder AROM Flexion R 67, L 143; ABD R 77, L 127 Goal status: INITIAL     LONG TERM GOALS: Target date: 05/28/2022     Patient will demonstrate improved function as evidenced by a score of 64 on FOTO measure for full participation in activities at home and in the community.  Baseline: 04/01/22: 48 Goal status: INITIAL   2.  Patient will have MMT 4+/5 or greater for all tested shoulder motions indicative of improved strength  as needed for lifting and carrying duties needed for his job Baseline: 04/01/22: MMT R shoulder flexion 2, abduction 2, ER 4 Goal status: INITIAL   3.  Patient will improve NPRS by 3 points or greater indicative of clinically significant change in pain status as needed for participation in household and work duties at [employer] Baseline: 04/01/22: NPRS 5/10 Goal status: INITIAL   4.  Patient will perform functional carry and lifting task simulating taking out trash at work as needed for full participation in his work  duties Baseline: 04/01/22: Significant pain and limitation with taking out trash Goal status: INITIAL     PLAN: PT FREQUENCY: 2x/week   PT DURATION: 8 weeks   PLANNED INTERVENTIONS: Therapeutic exercises, Therapeutic activity, Neuromuscular re-education, Balance training, Gait training, Patient/Family education, Joint mobilization, Dry Needling, Cryotherapy, Moist heat, and Manual therapy   PLAN FOR NEXT SESSION: Gentle shoulder ROM work, progressing shoulder AAROM as tolerated; continue with gradual progression of RTC strengthening and periscapular isotonics. May hold PT pending change in medical management plan per referring provider.        Valentina Gu, PT, DPT #[id number]  Eilleen Kempf, PT 05/04/2022, 3:42 PM

## 2022-05-05 ENCOUNTER — Encounter: Payer: Self-pay | Admitting: Physical Therapy

## 2022-05-06 ENCOUNTER — Ambulatory Visit: Payer: Medicare Other | Admitting: Physical Therapy

## 2022-05-06 ENCOUNTER — Encounter: Payer: Self-pay | Admitting: Physical Therapy

## 2022-05-06 DIAGNOSIS — M6281 Muscle weakness (generalized): Secondary | ICD-10-CM

## 2022-05-06 DIAGNOSIS — M25611 Stiffness of right shoulder, not elsewhere classified: Secondary | ICD-10-CM

## 2022-05-06 DIAGNOSIS — M25511 Pain in right shoulder: Secondary | ICD-10-CM

## 2022-05-06 NOTE — Therapy (Signed)
OUTPATIENT PHYSICAL THERAPY TREATMENT NOTE   Patient Name: Shawn Atkins MRN: [id number] DOB:01/14/1956, 66 y.o., male Today's Date: 05/06/2022  PCP: Lynnell Jude, MD REFERRING PROVIDER: Lynnell Jude, MD  END OF SESSION:   PT End of Session - 05/06/22 1551     Visit Number 8    Number of Visits 17    Date for PT Re-Evaluation 05/27/22    Authorization Type [employer] Medicare, VL based on medical necessity    Progress Note Due on Visit 10    PT Start Time 1546    PT Stop Time 1630    PT Time Calculation (min) 44 min    Activity Tolerance Patient tolerated treatment well;Patient limited by pain    Behavior During Therapy WFL for tasks assessed/performed             Past Medical History:  Diagnosis Date   BPH (benign prostatic hypertrophy)    Bronchitis    Chronic prostatitis    Complication of anesthesia    Felt like "couldn't breathe" after triple Hernia surgery   Diabetes mellitus without complication (HCC)    Flank pain    Frequency    Gross hematuria    HTN (hypertension)    Inguinal hernia    Kidney stones    stones   Nocturia    Overweight    Spermatocele    Stricture of urethra    Vertigo    1 episode, 6-7 yrs ago   Past Surgical History:  Procedure Laterality Date   CATARACT EXTRACTION W/PHACO Left 07/22/2020   Procedure: CATARACT EXTRACTION PHACO AND INTRAOCULAR LENS PLACEMENT ([place]) LEFT DIABETIC 4.14  00:33.0;  Surgeon: Eulogio Bear, MD;  Location: [facility];  Service: Ophthalmology;  Laterality: Left;  Diabetic - oral meds   CATARACT EXTRACTION W/PHACO Right 08/12/2020   Procedure: CATARACT EXTRACTION PHACO AND INTRAOCULAR LENS PLACEMENT ([place]) RIGHT DIABETIC;  Surgeon: Eulogio Bear, MD;  Location: [facility];  Service: Ophthalmology;  Laterality: Right;  1.99 0:26.2   COLONOSCOPY WITH PROPOFOL N/A 11/20/2015   Procedure: COLONOSCOPY WITH PROPOFOL;  Surgeon: Christene Lye, MD;  Location: [facility];  Service:  Endoscopy;  Laterality: N/A;   HERNIA REPAIR Bilateral 3734   umbilical and bil inguinal/ Dr Marina Gravel   JOINT REPLACEMENT     knee   KIDNEY STONE SURGERY     open lithotomy     Patient Active Problem List   Diagnosis Date Noted   History of rectal bleeding 06/01/2018   Left groin pain 03/18/2018   Sacroiliac joint pain 01/13/2017   Abdominal pain, left lower quadrant 08/23/2015   BPH with obstruction/lower urinary tract symptoms 08/23/2015   Biceps tendinitis 06/25/2015      REFERRING DIAG:  M54.2 (ICD-10-CM) - Neck pain  M25.519 (ICD-10-CM) - Shoulder pain      THERAPY DIAG:  Acute pain of right shoulder   Stiffness of right shoulder, not elsewhere classified   Muscle weakness (generalized)   Rationale for Evaluation and Treatment Rehabilitation   PERTINENT HISTORY: Patient is a 66 year old male with atraumatic right shoulder pain since late April 2023; denies any specific injury, but 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  He has to lift heavy trash bags and throw them over his shoulders into a dumpster. He reports getting injections in 4 different regions in his shoulder that did not help. He does not remember a specific instance in which this pain began.  Patient  reports intermittent popping in his shoulder that is painful. Patient reports dull ache radiating down R arm down to his elbow. Negative radiographs. Patient reports pain down to his forearm, no wrist pain or hand/digit pain; no numbness/tingling. Patient reports intermittent disturbed sleep that improves with position change. Patient reports history of notable neck strain about 20 years ago, no hardware in neck. Pt was given ER precautions of neck, and PA at hospital documented possible partial rotator cuff tear versus cervical radiculitis/radiculopathy.    PRECAUTIONS: None       OBJECTIVE (objective information is from initial evaluation 04/01/22 unless otherwise stated):    DIAGNOSTIC  FINDINGS:  X-rays of R shoulder: negative   PATIENT SURVEYS:  FOTO 48, predicted goal score of 64   COGNITION:           Overall cognitive status: Within functional limits for tasks assessed                                  SENSATION: Not tested   POSTURE: Forward head, protracted cervical spine; rounded shoulders and increased thoracic kyphosis       UPPER EXTREMITY ROM:    Active ROM Right eval Left eval Right 05/04/22  Shoulder flexion 67* 143 74  Shoulder extension 63 55 43  Shoulder abduction 77* crepitus  127 62  Shoulder adduction        Shoulder internal rotation        Shoulder external rotation        Elbow flexion WNL WNL WNL  Elbow extension WNL* (pain with straight arm) WNL WNL  Wrist flexion        Wrist extension        Wrist ulnar deviation        Wrist radial deviation        Wrist pronation        Wrist supination        (Blank rows = not tested) *Indicates pain   Passive ROM Right eval Left eval  Shoulder flexion 150* (pain end-range)    Shoulder extension      Shoulder abduction 140* (pain end-range)    Shoulder adduction      Shoulder internal rotation 70    Shoulder external rotation 80* (pain end-range)    (Blank rows = not tested) *Indicates pain     UPPER EXTREMITY MMT:   MMT Right eval Left eval  Shoulder flexion 2 4+  Shoulder extension      Shoulder abduction 2 4+  Shoulder adduction      Shoulder internal rotation      Shoulder external rotation 4* 5  Middle trapezius      Lower trapezius      Elbow flexion      Elbow extension      Wrist flexion      Wrist extension      Wrist ulnar deviation      Wrist radial deviation      Wrist pronation      Wrist supination      Grip strength (lbs)      (Blank rows = not tested)   Cervical Spine Screen Spurlings: R Negative, L Negative Cervical compression/distraction: Negative    Cervical spine AROM Cervical flexion:  WNL Cervical extension: Moderate motion  loss* Lateral flexion: Right WNL , Left WNL Cervical rotation: Right WNL, Left WNL (Overpressure performed with all motions, no concordant sign  reproduction) *Indicates pain     SHOULDER SPECIAL TESTS:            SLAP lesions: Biceps load test: positive , Speed test: Positive             Rotator cuff assessment: Drop arm test: positive , Empty can test: pt unable to assume test position;  Full can test: Empty can test: pt unable to assume test position, and Infraspinatus test: Positive for pain reproduction               JOINT MOBILITY TESTING:  Glenohumeral joint: Unable to fully assess due to pain with grade 1 GHJ gliding in any direction today   PALPATION:  Tenderness to palpation along R levator scapulae 1+, R supraspinatus 2+, R infraspinatus 2+, R posterior and middle deltoid 1+, R bicipital groove 3+                     TODAY'S TREATMENT:    SUBJECTIVE: Patient reports 5/10 pain at arrival to PT. Patient reports limited participation in home exercise program over the last week given his concern for completing exercise after work and fear of worsening pain. He has pain radiating from R shoulder down to R 5th digit per pt report.        PAIN:  Are you having pain? Yes: NPRS scale: 5/10 Pain location: R shoulder/proximal arm Pain description: dull ache Aggravating factors: overhead reaching, reaching out in front, heavier lifting Relieving factors: ice, Tylenol, anti-inflammatory medication         Manual Therapy - for symptom modulation, soft tissue sensitivity and mobility, joint mobility, ROM    (All performed in supine lying) STM/DTM/TPR: R UT, R infraspinatus Gentle gr I-II glenohumeral joint mobilizations in loose-packed position A-P, gentle gr I-II inferior glide   Gentle R shoulder PROM within pt tolerance with gentle oscillations through ROM to decrease sensitivity; flexion, ABD, ER, IR        -performed x 2 minutes    Attempted cervical general traction in  supine and sitting; no change in upper limb referred pain  Patient has pain with Spurling's R in R paracervical region, but no concordant upper limb pain or paresthesias are reproduced. Re-tested cervical compression with no reproduction of R upper limb symptoms      *not today* Rhythmic stabilization at 90 deg forward elevation; 2x30 sec STM R UT Trial of general cervical spine traction in supine x 3 minutes, 10 sec on, 10 sec off;          Therapeutic Exercise - for improved soft tissue flexibility and extensibility as needed for ROM    Supine cervical sidebend stretch; sidebend to L only today; x20 with 1 sec hold   Attempted cross-body stretch with pain along deltopectoral groove; stopped due to anterior shoulder pain  Self-release with lacrosse ball for infraspinatus; x 2 min with gentle rolling across infraspinatus      PATIENT EDUCATION: HEP update and review        *not today* Swiss ball forward roll on table, Silver physioball; 2x10, with R hand fixed on top of ball  Wand ER; standing; 2x10 Reactive isometrics in standing, ER/IR walkout with Red Tband; 2x10 each direction  Sidelying external rotation; performed with 1-lb Dbell; 2x8 repetitions - verbal and tactile cueing for maintain arm at side versus in slight shoulder flexion and to maintain scapular retraction/depression Serratus slide on wall for active forward elevation mobility with serratus activation; 2x10  Standing Tband  Row, Blue Tband; tactile cueing for scapular depression with retraction; 2x10 Table slide, flexion; x10, 5 sec hold; ROM within limits of pain Dynamic upper trapezius stretch, seated; 10x, 1 sec brief hold, stopped early due to neck pain  Supine cervical retraction trial to check for centralization/peripheralization; pain in neck during, increase in pain after with no change in location of symptoms; performed x 10  Dynamic levator scapulae stretch, seated; 20x, 1 sec brief hold, no UE pull,  looking toward armpit only  (discomfort in low back with attempted 30-sec static stretch and difficulty using L shoulder to pull head into stretch) Wand flexion in supine attempted; difficulty with return to neutral and painful arc; stopped after 5 reps Pendulum; x30 forward-backward in staggered stance with L upper limb holding edge of table ER/IR isometrics at wall; x10, 5 sec, with towel roll; heavy cueing for intensity of force applied and avoidance of putting body weight onto arm       PATIENT EDUCATION: Education details: see above Person educated: Patient Education method: Consulting civil engineer, Demonstration, and Handouts Education comprehension: verbalized understanding and returned demonstration     HOME EXERCISE PROGRAM: Access Code [email or web address]     ASSESSMENT:   CLINICAL IMPRESSION: Patient has made modest progress with ability to access shoulder motion in clinic with active-assisted drills and passive ROM. He has significant difficulty with maintaining forward progress with PT with symptoms frequently exacerbated and some referred pain mimicking that of cervical spine referred pain (though cervical spine testing/provocative maneuvers have been negative for concordant pain to date). No benefit from traction and only fleeting pain with no centralization or abatement of symptoms with retraction. Symptoms are briefly reproduced with palpation of R infraspinatus. Utilized TPR today and instructed patient on self-release technique. Pt expresses notable concern for ongoing pain and limited functional UE use and intends on following up with his referring physician. Patient needs continued work on restoration of AROM, strength, and RUE functional use (performs moderate lifting, cleaning tasks, and taking out trash for 4-hour shifts at [employer]). Pt will benefit from continued skilled PT intervention to address the noted impairments and activity limitations for best return to PLOF; may hold PT if medical  management plan is changed.      OBJECTIVE IMPAIRMENTS decreased ROM, decreased strength, impaired UE functional use, postural dysfunction, and pain.    ACTIVITY LIMITATIONS carrying, lifting, dressing, and reach over head   PARTICIPATION LIMITATIONS: occupation (part time work at [employer]), playing with grandchildren, and fishing   [place] Age, Past/current experiences, Profession, and 3+ comorbidities: (Type 2 DM, HTN, overweight)  are also affecting patient's functional outcome.    REHAB POTENTIAL: Good   CLINICAL DECISION MAKING: Evolving/moderate complexity   EVALUATION COMPLEXITY: Moderate     GOALS:   SHORT TERM GOALS: Target date: 04/23/2022    Patient will be independent and compliant with given HEP as needed for augmenting in-clinic PT intervention to improve shoulder mobility and upper limb function Baseline: 04/01/22: Baseline HEP initiated Goal status: INITIAL   2.  Patient will improve shoulder flexion and abduction AROM within 10 degrees of contralateral upper extremity indicative of improved shoulder elevation ROM as needed for overhead and forward reaching and completion of his work duties and household ADLs Baseline: 04/01/22: Shoulder AROM Flexion R 67, L 143; ABD R 77, L 127 Goal status: INITIAL     LONG TERM GOALS: Target date: 05/28/2022     Patient will demonstrate improved function as evidenced by a score of 64  on FOTO measure for full participation in activities at home and in the community.  Baseline: 04/01/22: 48 Goal status: INITIAL   2.  Patient will have MMT 4+/5 or greater for all tested shoulder motions indicative of improved strength as needed for lifting and carrying duties needed for his job Baseline: 04/01/22: MMT R shoulder flexion 2, abduction 2, ER 4 Goal status: INITIAL   3.  Patient will improve NPRS by 3 points or greater indicative of clinically significant change in pain status as needed for participation in household and work duties  at [employer] Baseline: 04/01/22: NPRS 5/10 Goal status: INITIAL   4.  Patient will perform functional carry and lifting task simulating taking out trash at work as needed for full participation in his work duties Baseline: 04/01/22: Significant pain and limitation with taking out trash Goal status: INITIAL     PLAN: PT FREQUENCY: 2x/week   PT DURATION: 8 weeks   PLANNED INTERVENTIONS: Therapeutic exercises, Therapeutic activity, Neuromuscular re-education, Balance training, Gait training, Patient/Family education, Joint mobilization, Dry Needling, Cryotherapy, Moist heat, and Manual therapy   PLAN FOR NEXT SESSION: Gentle shoulder ROM work, progressing shoulder AAROM as tolerated; continue with gradual progression of RTC strengthening and periscapular isotonics. May hold PT pending change in medical management plan per referring provider.       Valentina Gu, PT, DPT #[id number]  Eilleen Kempf, PT 05/06/2022, 3:52 PM

## 2022-05-11 ENCOUNTER — Ambulatory Visit: Payer: Medicare Other | Admitting: Physical Therapy

## 2022-05-13 ENCOUNTER — Encounter: Payer: Self-pay | Admitting: Physical Therapy

## 2022-05-13 ENCOUNTER — Ambulatory Visit: Payer: Medicare Other | Attending: Family Medicine | Admitting: Physical Therapy

## 2022-05-13 DIAGNOSIS — M6281 Muscle weakness (generalized): Secondary | ICD-10-CM | POA: Insufficient documentation

## 2022-05-13 DIAGNOSIS — M25611 Stiffness of right shoulder, not elsewhere classified: Secondary | ICD-10-CM | POA: Diagnosis present

## 2022-05-13 DIAGNOSIS — M25511 Pain in right shoulder: Secondary | ICD-10-CM | POA: Insufficient documentation

## 2022-05-13 NOTE — Therapy (Signed)
OUTPATIENT PHYSICAL THERAPY GOAL UPDATE AND DISCHARGE SUMMARY   Patient Name: Shawn Atkins MRN: [id number] DOB:01-09-1956, 66 y.o., male Today's Date: 05/13/2022  PCP: Lynnell Jude, MD REFERRING PROVIDER: Lynnell Jude, MD  END OF SESSION:   PT End of Session - 05/13/22 1546     Visit Number 9    Number of Visits 17    Date for PT Re-Evaluation 05/27/22    Authorization Type [employer] Medicare, VL based on medical necessity    Progress Note Due on Visit 10    PT Start Time 1545    PT Stop Time 1638    PT Time Calculation (min) 53 min    Activity Tolerance Patient tolerated treatment well;Patient limited by pain    Behavior During Therapy WFL for tasks assessed/performed             Past Medical History:  Diagnosis Date   BPH (benign prostatic hypertrophy)    Bronchitis    Chronic prostatitis    Complication of anesthesia    Felt like "couldn't breathe" after triple Hernia surgery   Diabetes mellitus without complication (HCC)    Flank pain    Frequency    Gross hematuria    HTN (hypertension)    Inguinal hernia    Kidney stones    stones   Nocturia    Overweight    Spermatocele    Stricture of urethra    Vertigo    1 episode, 6-7 yrs ago   Past Surgical History:  Procedure Laterality Date   CATARACT EXTRACTION W/PHACO Left 07/22/2020   Procedure: CATARACT EXTRACTION PHACO AND INTRAOCULAR LENS PLACEMENT ([place]) LEFT DIABETIC 4.14  00:33.0;  Surgeon: Eulogio Bear, MD;  Location: [facility];  Service: Ophthalmology;  Laterality: Left;  Diabetic - oral meds   CATARACT EXTRACTION W/PHACO Right 08/12/2020   Procedure: CATARACT EXTRACTION PHACO AND INTRAOCULAR LENS PLACEMENT ([place]) RIGHT DIABETIC;  Surgeon: Eulogio Bear, MD;  Location: [facility];  Service: Ophthalmology;  Laterality: Right;  1.99 0:26.2   COLONOSCOPY WITH PROPOFOL N/A 11/20/2015   Procedure: COLONOSCOPY WITH PROPOFOL;  Surgeon: Christene Lye, MD;  Location: [facility]  ENDOSCOPY;  Service: Endoscopy;  Laterality: N/A;   HERNIA REPAIR Bilateral 6387   umbilical and bil inguinal/ Dr Marina Gravel   JOINT REPLACEMENT     knee   KIDNEY STONE SURGERY     open lithotomy     Patient Active Problem List   Diagnosis Date Noted   History of rectal bleeding 06/01/2018   Left groin pain 03/18/2018   Sacroiliac joint pain 01/13/2017   Abdominal pain, left lower quadrant 08/23/2015   BPH with obstruction/lower urinary tract symptoms 08/23/2015   Biceps tendinitis 06/25/2015      REFERRING DIAG:  M54.2 (ICD-10-CM) - Neck pain  M25.519 (ICD-10-CM) - Shoulder pain      THERAPY DIAG:  Acute pain of right shoulder   Stiffness of right shoulder, not elsewhere classified   Muscle weakness (generalized)   Rationale for Evaluation and Treatment Rehabilitation   PERTINENT HISTORY: Patient is a 66 year old male with atraumatic right shoulder pain since late April 2023; denies any specific injury, but 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  He has to lift heavy trash bags and throw them over his shoulders into a dumpster. He reports getting injections in 4 different regions in his shoulder that did not help. He does not remember a specific instance in which this pain  began.  Patient reports intermittent popping in his shoulder that is painful. Patient reports dull ache radiating down R arm down to his elbow. Negative radiographs. Patient reports pain down to his forearm, no wrist pain or hand/digit pain; no numbness/tingling. Patient reports intermittent disturbed sleep that improves with position change. Patient reports history of notable neck strain about 20 years ago, no hardware in neck. Pt was given ER precautions of neck, and PA at hospital documented possible partial rotator cuff tear versus cervical radiculitis/radiculopathy.    PRECAUTIONS: None       OBJECTIVE (objective information is from initial evaluation 04/01/22 unless otherwise  stated):    DIAGNOSTIC FINDINGS:  X-rays of R shoulder: negative   PATIENT SURVEYS:  FOTO 48, predicted goal score of 64. 05/13/22: FOTO    COGNITION:           Overall cognitive status: Within functional limits for tasks assessed                                  SENSATION: Not tested   POSTURE: Forward head, protracted cervical spine; rounded shoulders and increased thoracic kyphosis       UPPER EXTREMITY ROM:    Active ROM Right eval Left eval Right 05/04/22 Right 05/13/22  Shoulder flexion 67* 143 74 81  Shoulder extension 63 55 43 37  Shoulder abduction 77* crepitus  127 62 44  Shoulder adduction         Shoulder internal rotation         Shoulder external rotation         Elbow flexion WNL WNL WNL WNL  Elbow extension WNL* (pain with straight arm) WNL WNL WNL  Wrist flexion         Wrist extension         Wrist ulnar deviation         Wrist radial deviation         Wrist pronation         Wrist supination         (Blank rows = not tested) *Indicates pain    Passive ROM Right eval Left eval Right 05/13/22  Shoulder flexion 150* (pain end-range)   90*  Shoulder extension       Shoulder abduction 140* (pain end-range)   45*  Shoulder adduction       Shoulder internal rotation 70   WFL  Shoulder external rotation 80* (pain end-range)           45*  (Blank rows = not tested) *Indicates pain     UPPER EXTREMITY MMT:   MMT Right eval Left eval Right 05/13/22  Shoulder flexion 2 4+ 3+*  Shoulder extension       Shoulder abduction 2 4+ 4  Shoulder adduction       Shoulder internal rotation     4+  Shoulder external rotation 4* 5 4*  Middle trapezius       Lower trapezius       Elbow flexion     4*  Elbow extension       Wrist flexion       Wrist extension       Wrist ulnar deviation       Wrist radial deviation       Wrist pronation       Wrist supination       Grip strength (lbs)       (  Blank rows = not tested)   Cervical Spine  Screen Spurlings: R Negative, L Negative Cervical compression/distraction: Negative    Repeated retraction: increase during in R UT, no change after Repeated sidebend to R (to affected side): increase during in R arm, worse along R UT   Cervical spine AROM Cervical flexion:  WNL Cervical extension: Moderate motion loss* Lateral flexion: Right WNL , Left WNL Cervical rotation: Right WNL, Left WNL (Overpressure performed with all motions, no concordant sign reproduction) *Indicates pain     SHOULDER SPECIAL TESTS:            SLAP lesions: Biceps load test: positive , Speed test: Positive             Rotator cuff assessment: Drop arm test: positive , Empty can test: pt unable to assume test position;  Full can test: Empty can test: pt unable to assume test position, and Infraspinatus test: Positive for pain reproduction               JOINT MOBILITY TESTING:  Glenohumeral joint: Unable to fully assess due to pain with grade 1 GHJ gliding in any direction today   PALPATION:  Tenderness to palpation along R levator scapulae 1+, R supraspinatus 2+, R infraspinatus 2+, R posterior and middle deltoid 1+, R bicipital groove 3+                     TODAY'S TREATMENT:    SUBJECTIVE: Patient reports severe pain down his RUE that aches like toothache. Patient reports he can partially perform his HEP; difficulty with performing upper trapezius stretching in sitting or supine and also with RTC isometrics. Patient reports aching down length of his arm. Pt reports some days he is able to use his arm fairly well and some days he has severe pain and disability with his R arm. Pt reports being up since 4 AM with his arm hurting. Patient reports disturbed sleep mainly with turning to his R. He reports difficulty continuing to work part time at [employer] and states there is no light/modified duty available for him.        PAIN:  Are you having pain? Yes: NPRS scale: 5/10 Pain location: R  shoulder/proximal arm Pain description: dull ache Aggravating factors: overhead reaching, reaching out in front, heavier lifting Relieving factors: ice, Tylenol, anti-inflammatory medication         Manual Therapy - for symptom modulation, soft tissue sensitivity and mobility, joint mobility, ROM    (All performed in supine lying) STM/DTM/TPR: R UT, R infraspinatus  Pt is unable to tolerate glenohumeral joint mobilizations today   Gentle R shoulder PROM within pt tolerance with gentle oscillations through ROM to decrease sensitivity; flexion, ABD, ER, IR        -performed x 2 minutes      *not today* Rhythmic stabilization at 90 deg forward elevation; 2x30 sec STM R UT Trial of general cervical spine traction in supine x 3 minutes, 10 sec on, 10 sec off;           Therapeutic Exercise - for improved soft tissue flexibility and extensibility as needed for ROM   *Goal update*  Cervical spine additional repeated movement screen (see above in Objective section)     *not today* Supine cervical sidebend stretch; sidebend to L only today; x20 with 1 sec hold  Self-release with lacrosse ball for infraspinatus; x 2 min with gentle rolling across infraspinatus Swiss ball forward roll on table,  Silver physioball; 2x10, with R hand fixed on top of ball  Wand ER; standing; 2x10 Reactive isometrics in standing, ER/IR walkout with Red Tband; 2x10 each direction  Sidelying external rotation; performed with 1-lb Dbell; 2x8 repetitions - verbal and tactile cueing for maintain arm at side versus in slight shoulder flexion and to maintain scapular retraction/depression Serratus slide on wall for active forward elevation mobility with serratus activation; 2x10  Standing Tband Row, Blue Tband; tactile cueing for scapular depression with retraction; 2x10 Table slide, flexion; x10, 5 sec hold; ROM within limits of pain Dynamic upper trapezius stretch, seated; 10x, 1 sec brief hold, stopped  early due to neck pain  Supine cervical retraction trial to check for centralization/peripheralization; pain in neck during, increase in pain after with no change in location of symptoms; performed x 10  Dynamic levator scapulae stretch, seated; 20x, 1 sec brief hold, no UE pull, looking toward armpit only  (discomfort in low back with attempted 30-sec static stretch and difficulty using L shoulder to pull head into stretch) Wand flexion in supine attempted; difficulty with return to neutral and painful arc; stopped after 5 reps Pendulum; x30 forward-backward in staggered stance with L upper limb holding edge of table ER/IR isometrics at wall; x10, 5 sec, with towel roll; heavy cueing for intensity of force applied and avoidance of putting body weight onto arm       Cold pack (unbilled) - for anti-inflammatory and analgesic effect as needed for reduced pain and improved ability to participate in active PT intervention, utilized post-session with cold pack draped along R shoulder in sitting during electric stimulation, x 10 minutes    Premodulated E-stim - utilized for pain control to improve active participation in physical therapy;  2 channels - 1 along L ACJ down to L lateral arm at mid-length of humerus and 1 along L upper trapezius. Continuous cycle time, beat low 80 Hz, beat high 150 Hz, intensity: 9.4 V. Duration: 10 minutes         PATIENT EDUCATION: Education details: see above Person educated: Patient Education method: Explanation, Demonstration, and Handouts Education comprehension: verbalized understanding and returned demonstration     HOME EXERCISE PROGRAM: Access Code [email or web address]     ASSESSMENT:   CLINICAL IMPRESSION: Patient has not been able to manage RUE pain with treatment of posterior cuff musculature reviewed last session and has ongoing debilitating pain and limited functional upper extremity use due to R upper limb pain. Patient maintains that symptoms began in R  paracervical and R upper trapezius region prior to shoulder/arm becoming primary area of pain - we have worked extensively to rule out cervical radiculopathy and modulate symptoms with cervical distraction or repeated movement with no results. Pt has had difficulty with participation in PT with various strategies attempted due to R paracervical pain and exacerbation of R arm symptoms; his compliance with HEP has also been hampered by pain and fear of flare-up or damage. Pt demonstrates modest improvement in flexion AROM and worsening abduction AROM. All PROM measures are significantly lower due to pain and guarding. Pt has lower FOTO score today than his initial evaluation and slightly worse pain score (though it hasn't increased to clinically significant difference). This may be resultant from return to his work duties at [employer] (including taking out trash, cleaning, mopping, sweeping) with ongoing pain and impaired functional use of R upper limb. Given poor progress to date with 6 weeks of intervention with various strategies attempted, will have pt follow up  with referring provider and consider workup with specialist to discuss medical management options.      OBJECTIVE IMPAIRMENTS decreased ROM, decreased strength, impaired UE functional use, postural dysfunction, and pain.    ACTIVITY LIMITATIONS carrying, lifting, dressing, and reach over head   PARTICIPATION LIMITATIONS: occupation (part time work at [employer]), playing with grandchildren, and fishing   [place] Age, Past/current experiences, Profession, and 3+ comorbidities: (Type 2 DM, HTN, overweight)  are also affecting patient's functional outcome.       GOALS:   SHORT TERM GOALS: Target date: 04/23/2022    Patient will be independent and compliant with given HEP as needed for augmenting in-clinic PT intervention to improve shoulder mobility and upper limb function Baseline: 04/01/22: Baseline HEP initiated.    05/13/22: Limited  participation with pain c supine sidebending and RTC isometrics; pt completing part of HEP for fear of increased pain or damage.   Goal status: NOT MET    2.  Patient will improve shoulder flexion and abduction AROM within 10 degrees of contralateral upper extremity indicative of improved shoulder elevation ROM as needed for overhead and forward reaching and completion of his work duties and household ADLs Baseline: 04/01/22: Shoulder AROM Flexion R 67, L 143; ABD R 77, L 127.     05/13/22: Shoulder AROM Flexion R 81, L 143; ABD R 44, L 127 Goal status: NOT MET     LONG TERM GOALS: Target date: 05/28/2022     Patient will demonstrate improved function as evidenced by a score of 64 on FOTO measure for full participation in activities at home and in the community.  Baseline: 04/01/22: 48.   05/13/22: 43 Goal status: NOT MET    2.  Patient will have MMT 4+/5 or greater for all tested shoulder motions indicative of improved strength as needed for lifting and carrying duties needed for his job Baseline: 04/01/22: MMT R shoulder flexion 2, abduction 2, ER 4.  05/13/22: MMT R shoulder flexion 3+, abduction 4, ER 4.   Goal status: PARTIAL PROGRESS   3.  Patient will improve NPRS by 3 points or greater indicative of clinically significant change in pain status as needed for participation in household and work duties at [place]: 04/01/22: NPRS 5/10.    05/13/22: 6/10 pain at worst  Goal status: NOT MET    4.  Patient will perform functional carry and lifting task simulating taking out trash at work as needed for full participation in his work duties Baseline: 04/01/22: Significant pain and limitation with taking out trash.    05/13/22: Unable to complete lifting at this time due to severe pain and disability.  Goal status: NOT MET      PLAN: PT FREQUENCY: -   PT DURATION: -    PLAN FOR NEXT SESSION: Patient to be referred back to his physician for additional workup and to discuss further medical  management options, possibly including follow-up with orthopedics    Valentina Gu, PT, DPT #[id number]  Eilleen Kempf, PT 05/13/2022, 5:19 PM

## 2022-05-29 ENCOUNTER — Other Ambulatory Visit: Payer: Self-pay | Admitting: Urology

## 2022-05-29 DIAGNOSIS — E349 Endocrine disorder, unspecified: Secondary | ICD-10-CM

## 2022-06-18 ENCOUNTER — Other Ambulatory Visit: Payer: Self-pay

## 2022-06-18 DIAGNOSIS — N138 Other obstructive and reflux uropathy: Secondary | ICD-10-CM

## 2022-06-18 DIAGNOSIS — E349 Endocrine disorder, unspecified: Secondary | ICD-10-CM

## 2022-06-19 ENCOUNTER — Encounter: Payer: Self-pay | Admitting: Family Medicine

## 2022-07-01 ENCOUNTER — Other Ambulatory Visit
Admission: RE | Admit: 2022-07-01 | Discharge: 2022-07-01 | Disposition: A | Discharge status: Home / Self Care | Payer: Medicare Other | Attending: Urology | Admitting: Urology

## 2022-07-01 DIAGNOSIS — N401 Enlarged prostate with lower urinary tract symptoms: Secondary | ICD-10-CM

## 2022-07-01 DIAGNOSIS — N138 Other obstructive and reflux uropathy: Secondary | ICD-10-CM | POA: Insufficient documentation

## 2022-07-01 DIAGNOSIS — E349 Endocrine disorder, unspecified: Secondary | ICD-10-CM | POA: Diagnosis present

## 2022-07-01 LAB — PSA: Prostatic Specific Antigen: 0.14 ng/mL (ref 0.00–4.00)

## 2022-07-01 LAB — HEMOGLOBIN AND HEMATOCRIT, BLOOD
HCT: 43.2 % (ref 39.0–52.0)
Hemoglobin: 14.3 g/dL (ref 13.0–17.0)

## 2022-07-03 LAB — TESTOSTERONE: Testosterone: 290 ng/dL (ref 264–916)

## 2022-07-05 NOTE — Progress Notes (Unsigned)
07/06/22 11:19 AM   Shawn Atkins 10/08/1956 [id number]  Referring provider:  Lynnell Jude, MD [address] [place],  [place] [address]  Urological history: 1. BPH with LU TS -I PSS 4/2 -tadalafil 5 mg daily  2. Nocturia -Risk factors for nocturia: hypertension and BPH.     3. ED - contributing factors of age, BPH, testosterone deficiency and DM -SHIM 18  - tadalafil 5 mg daily    4. High risk hematuria - Former smoker - CTU 2014  left kidney demonstrates focal wedge-shaped indentation with near complete cortical loss within the lateral aspect of the mid pole of the kidney. A focal cyst is identified in this  area demonstrating Hounsfield units of 1 and 7 and measures approximately 1 cm - no cystoscopy for unknown reasons - UA negative for micro heme 10/2021   5. Nephrolithiasis - contrast CT in 2016 notes 2 mm right mid kidney nonobstructive calculus. 1-2 mm right kidney lower pole nonobstructive calculus. Potential 1 mm left kidney lower pole nonobstructive calculus - no stones seen on contrast CT in 2019  6. Prostate cancer screening -PSA (06/2022) 0.14 -AUA guidelines (2023) recommend screening for prostate cancer in men ages 34 to 40 every 2 to 4 years -no family history of prostate cancer, breast cancer or ovarian cancer  -he will have yearly screening secondary to TRT  7. Testosterone deficiency -contributing factors of age, diabetes and obesity -testosterone level (06/2022) 290 -hemoglobin/hematocrit (06/2022) 14.3/43.2   Chief Complaint  Patient presents with   Hypogonadism     HPI: Shawn Atkins is a 66 y.o.male who presents today for 6 month follow up.     He has no urinary complaints.  Patient denies any modifying or aggravating factors.  Patient denies any gross hematuria, dysuria or suprapubic/flank pain.  Patient denies any fevers, chills, nausea or vomiting.     IPSS     Row Name 07/06/22 1100         International Prostate Symptom Score    How often have you had the sensation of not emptying your bladder? Not at All     How often have you had to urinate less than every two hours? Less than 1 in 5 times     How often have you found you stopped and started again several times when you urinated? Less than 1 in 5 times     How often have you found it difficult to postpone urination? Less than 1 in 5 times     How often have you had a weak urinary stream? Not at All     How often have you had to strain to start urination? Not at All     How many times did you typically get up at night to urinate? 1 Time     Total IPSS Score 4       Quality of Life due to urinary symptoms   If you were to spend the rest of your life with your urinary condition just the way it is now how would you feel about that? Mostly Satisfied              Score:  1-7 Mild 8-19 Moderate 20-35 Severe   Patient is not having spontaneous erections.  He denies any pain or curvature with erections.    SHIM     Row Name 07/06/22 1105         SHIM: Over the last 6 months:   How do you  rate your confidence that you could get and keep an erection? Moderate     When you had erections with sexual stimulation, how often were your erections hard enough for penetration (entering your partner)? Sometimes (about half the time)     During sexual intercourse, how often were you able to maintain your erection after you had penetrated (entered) your partner? Most Times (much more than half the time)     During sexual intercourse, how difficult was it to maintain your erection to completion of intercourse? Slightly Difficult     When you attempted sexual intercourse, how often was it satisfactory for you? Most Times (much more than half the time)       SHIM Total Score   SHIM 18              Score: 1-7 Severe ED 8-11 Moderate ED 12-16 Mild-Moderate ED 17-21 Mild ED 22-25 No ED    PMH: Past Medical History:  Diagnosis Date   BPH (benign prostatic  hypertrophy)    Bronchitis    Chronic prostatitis    Complication of anesthesia    Felt like "couldn't breathe" after triple Hernia surgery   Diabetes mellitus without complication (HCC)    Flank pain    Frequency    Gross hematuria    HTN (hypertension)    Inguinal hernia    Kidney stones    stones   Nocturia    Overweight    Spermatocele    Stricture of urethra    Vertigo    1 episode, 6-7 yrs ago    Surgical History: Past Surgical History:  Procedure Laterality Date   CATARACT EXTRACTION W/PHACO Left 07/22/2020   Procedure: CATARACT EXTRACTION PHACO AND INTRAOCULAR LENS PLACEMENT ([place]) LEFT DIABETIC 4.14  00:33.0;  Surgeon: Eulogio Bear, MD;  Location: [facility];  Service: Ophthalmology;  Laterality: Left;  Diabetic - oral meds   CATARACT EXTRACTION W/PHACO Right 08/12/2020   Procedure: CATARACT EXTRACTION PHACO AND INTRAOCULAR LENS PLACEMENT ([place]) RIGHT DIABETIC;  Surgeon: Eulogio Bear, MD;  Location: [facility];  Service: Ophthalmology;  Laterality: Right;  1.99 0:26.2   COLONOSCOPY WITH PROPOFOL N/A 11/20/2015   Procedure: COLONOSCOPY WITH PROPOFOL;  Surgeon: Christene Lye, MD;  Location: [facility];  Service: Endoscopy;  Laterality: N/A;   HERNIA REPAIR Bilateral 4742   umbilical and bil inguinal/ Dr Marina Gravel   JOINT REPLACEMENT     knee   KIDNEY STONE SURGERY     open lithotomy      Home Medications:  Allergies as of 07/06/2022       Reactions   Penicillins Anaphylaxis   Morphine And Related Nausea And Vomiting        Medication List        Accurate as of July 06, 2022 11:19 AM. If you have any questions, ask your nurse or doctor.          STOP taking these medications    testosterone 50 MG/5GM (1%) Gel Commonly known as: ANDROGEL Stopped by: Everett Ricciardelli, PA-C       TAKE these medications    albuterol (2.5 MG/3ML) 0.083% nebulizer solution Commonly known as: PROVENTIL albuterol sulfate 2.5  mg/3 mL (0.083 %) solution for nebulization   atorvastatin 20 MG tablet Commonly known as: LIPITOR Take 20 mg by mouth daily.   BIOTIN PO Take by mouth daily.   clomiPHENE 50 MG tablet Commonly known as: CLOMID Take 1/2 tablet daily   cyclobenzaprine 10  MG tablet Commonly known as: FLEXERIL Take 10 mg by mouth 3 (three) times daily as needed.   fluticasone 50 MCG/ACT nasal spray Commonly known as: FLONASE fluticasone 50 mcg/actuation nasal spray,suspension   lisinopril 10 MG tablet Commonly known as: ZESTRIL Take 10 mg by mouth daily.   metFORMIN 500 MG 24 hr tablet Commonly known as: GLUCOPHAGE-XR Take 2,000 mg by mouth daily.   multivitamin tablet Take 1 tablet by mouth daily.   Ozempic (0.25 or 0.5 MG/DOSE) 2 MG/1.5ML Sopn Generic drug: Semaglutide(0.25 or 0.'5MG'$ /DOS) Inject 0.5 mg into the skin once a week.   tadalafil 5 MG tablet Commonly known as: CIALIS TAKE ONE TABLET BY MOUTH DAILY AS NEEDED FOR ERECTILE DYSFUNCTION   testosterone cypionate 200 MG/ML injection Commonly known as: DEPOTESTOSTERONE CYPIONATE Inject 1 mL (200 mg total) into the muscle every 14 (fourteen) days. Started by: Zara Council, PA-C   traMADol 50 MG tablet Commonly known as: ULTRAM tramadol 50 mg tablet prn   TURMERIC PO Take by mouth daily.        Allergies:  Allergies  Allergen Reactions   Penicillins Anaphylaxis   Morphine And Related Nausea And Vomiting    Family History: Family History  Problem Relation Age of Onset   Benign prostatic hyperplasia Father    Kidney disease Neg Hx    Prostate cancer Neg Hx     Social History:  reports that he quit smoking about 38 years ago. His smoking use included cigarettes. He has been exposed to tobacco smoke. His smokeless tobacco use includes snuff. He reports current alcohol use of about 1.0 standard drink of alcohol per week. He reports that he does not use drugs.   Physical Exam: BP 125/73 (BP Location: Left Arm,  Patient Position: Sitting, Cuff Size: Normal)   Pulse 89   Ht '5\' 9"'$  (1.753 m)   Wt 201 lb (91.2 kg)   BMI 29.68 kg/m   Constitutional:  Well nourished. Alert and oriented, No acute distress. HEENT: [place] AT, moist mucus membranes.  Trachea midline Cardiovascular: No clubbing, cyanosis, or edema. Respiratory: Normal respiratory effort, no increased work of breathing. Neurologic: Grossly intact, no focal deficits, moving all 4 extremities. Psychiatric: Normal mood and affect.   Laboratory Data: Component     Latest Ref Rng 07/01/2022  Testosterone     264 - 916 ng/dL 290     Prostatic Specific Antigen  Latest Ref Rng 0.00 - 4.00 ng/mL  11/20/2019 0.23   11/18/2020 0.75   11/24/2021 0.39   07/01/2022 0.14    Component Ref Range & Units 4 d ago  Hemoglobin 13.0 - 17.0 g/dL 14.3   HCT 39.0 - 52.0 % 43.2   Comment: Performed at [facility], [address] [place], [place] [address]  Resulting Agency  [facility]         Specimen Collected: 07/01/22 08:13 Last Resulted: 07/01/22 08:52        I have reviewed the labs.   Pertinent Imaging N/A  Assessment & Plan:    1. Testosterone deficiency  -testosterone levels are subtherapeutic, but he has been off the gel for one month -H & H WNL -The testosterone gel was becoming cost prohibitive and therefore he could not refill the medication -I sent a prescription in for testosterone cypionate as this appears to be less cost prohibitive -He will return for testosterone injection teaching with his wife  2. BPH with LUTS -PSA < 1 -continue conservative management, avoiding bladder irritants  and timed voiding's -Continue tadalafil 5 mg daily  3. Erectile dysfunction:    -Reassess once testosterone levels are therapeutic   Return for schedule testosterone injection teaching .  Dandrae Kustra, [place] [address], [place], [place] [address] [phone]

## 2022-07-06 ENCOUNTER — Ambulatory Visit: Payer: Medicare Other | Admitting: Urology

## 2022-07-06 ENCOUNTER — Encounter: Payer: Self-pay | Admitting: Urology

## 2022-07-06 VITALS — BP 125/73 | HR 89 | Ht 69.0 in | Wt 201.0 lb

## 2022-07-06 DIAGNOSIS — N138 Other obstructive and reflux uropathy: Secondary | ICD-10-CM | POA: Diagnosis not present

## 2022-07-06 DIAGNOSIS — N529 Male erectile dysfunction, unspecified: Secondary | ICD-10-CM | POA: Diagnosis not present

## 2022-07-06 DIAGNOSIS — E291 Testicular hypofunction: Secondary | ICD-10-CM

## 2022-07-06 DIAGNOSIS — E349 Endocrine disorder, unspecified: Secondary | ICD-10-CM

## 2022-07-06 DIAGNOSIS — N401 Enlarged prostate with lower urinary tract symptoms: Secondary | ICD-10-CM | POA: Diagnosis not present

## 2022-07-06 MED ORDER — TESTOSTERONE CYPIONATE 200 MG/ML IM SOLN: 200.0000 mg | INTRAMUSCULAR | 0 refills | Status: DC

## 2022-07-08 ENCOUNTER — Encounter
Admission: RE | Admit: 2022-07-08 | Discharge: 2022-07-08 | Disposition: A | Discharge status: Home / Self Care | Payer: Medicare Other | Source: Ambulatory Visit | Attending: Orthopedic Surgery | Admitting: Orthopedic Surgery

## 2022-07-08 DIAGNOSIS — E119 Type 2 diabetes mellitus without complications: Secondary | ICD-10-CM

## 2022-07-08 DIAGNOSIS — Z01818 Encounter for other preprocedural examination: Secondary | ICD-10-CM

## 2022-07-08 HISTORY — DX: Other specified postprocedural states: Z98.890

## 2022-07-08 HISTORY — DX: Obstructive sleep apnea (adult) (pediatric): G47.33

## 2022-07-08 HISTORY — DX: Gastro-esophageal reflux disease without esophagitis: K21.9

## 2022-07-08 HISTORY — DX: Personal history of urinary calculi: Z87.442

## 2022-07-08 NOTE — Patient Instructions (Signed)
Your procedure is scheduled on:07-16-22 Thursday Report to the Registration Desk on the 1st floor of the [facility].Then proceed to the 2nd floor Surgery Desk To find out your arrival time, please call [phone] between 1PM - 3PM on:07-15-22 Wednesday If your arrival time is 6:00 am, do not arrive prior to that time as the [facility] entrance doors do not open until 6:00 am.  REMEMBER: Instructions that are not followed completely may result in serious medical risk, up to and including death; or upon the discretion of your surgeon and anesthesiologist your surgery may need to be rescheduled.  Do not eat food after midnight the night before surgery.  No gum chewing, lozengers or hard candies.  You may however, drink Water up to 2 hours before you are scheduled to arrive for your surgery. Do not drink anything within 2 hours of your scheduled arrival time.  Type 1 and Type 2 diabetics should only drink water.  Do NOT take any medication the day of surgery  Stop your metFORMIN (GLUCOPHAGE-XR) 2 days prior to surgery-Last dose on 07-08-22)  One week prior to surgery: Stop Anti-inflammatories (NSAIDS) such as meloxicam (MOBIC), Advil, Aleve, Ibuprofen, Motrin, Naproxen, Naprosyn and Aspirin based products such as Excedrin, Goodys Powder, BC Powder.You may however, take Tylenol/Tramadol if needed for pain up until the day of surgery.  Stop ANY OVER THE COUNTER supplements/vitamins NOW (07-08-22) until after surgery (Biotin, Glucosamine-Chondroitin, Multivitamin)  No Alcohol for 24 hours before or after surgery.  No Smoking including e-cigarettes for 24 hours prior to surgery.  No chewable tobacco products for at least 6 hours prior to surgery.  No nicotine patches on the day of surgery.  Do not use any "recreational" drugs for at least a week prior to your surgery.  Please be advised that the combination of cocaine and anesthesia may have negative outcomes, up to and including  death. If you test positive for cocaine, your surgery will be cancelled.  On the morning of surgery brush your teeth with toothpaste and water, you may rinse your mouth with mouthwash if you wish. Do not swallow any toothpaste or mouthwash.  Use CHG Soap as directed on instruction sheet.  Do not wear jewelry, make-up, hairpins, clips or nail polish.  Do not wear lotions, powders, or perfumes.   Do not shave body from the neck down 48 hours prior to surgery just in case you cut yourself which could leave a site for infection.  Also, freshly shaved skin may become irritated if using the CHG soap.  Contact lenses, hearing aids and dentures may not be worn into surgery.  Do not bring valuables to the hospital. [facility] is not responsible for any missing/lost belongings or valuables.   Bring your C-PAP to the hospital with you   Notify your doctor if there is any change in your medical condition (cold, fever, infection).  Wear comfortable clothing (specific to your surgery type) to the hospital.  After surgery, you can help prevent lung complications by doing breathing exercises.  Take deep breaths and cough every 1-2 hours. Your doctor may order a device called an Incentive Spirometer to help you take deep breaths. When coughing or sneezing, hold a pillow firmly against your incision with both hands. This is called "splinting." Doing this helps protect your incision. It also decreases belly discomfort.  If you are being admitted to the hospital overnight, leave your suitcase in the car. After surgery it may be brought to your room.  If you are being discharged the day of surgery, you will not be allowed to drive home. You will need a responsible adult (18 years or older) to drive you home and stay with you that night.   If you are taking public transportation, you will need to have a responsible adult (18 years or older) with you. Please confirm with your physician that it is  acceptable to use public transportation.   Please call the [facility] Dept. at [phone] if you have any questions about these instructions.  Surgery Visitation Policy:  Patients undergoing a surgery or procedure may have two family members or support persons with them as long as the person is not COVID-19 positive or experiencing its symptoms.

## 2022-07-09 ENCOUNTER — Other Ambulatory Visit: Payer: Self-pay | Admitting: Orthopedic Surgery

## 2022-07-13 ENCOUNTER — Ambulatory Visit: Payer: Medicare Other | Admitting: Urology

## 2022-07-13 ENCOUNTER — Encounter
Admission: RE | Admit: 2022-07-13 | Discharge: 2022-07-13 | Disposition: A | Discharge status: Home / Self Care | Payer: Medicare Other | Source: Ambulatory Visit | Attending: Orthopedic Surgery | Admitting: Orthopedic Surgery

## 2022-07-13 DIAGNOSIS — Z01818 Encounter for other preprocedural examination: Secondary | ICD-10-CM | POA: Diagnosis present

## 2022-07-13 DIAGNOSIS — E119 Type 2 diabetes mellitus without complications: Secondary | ICD-10-CM | POA: Diagnosis not present

## 2022-07-13 LAB — BASIC METABOLIC PANEL
Anion gap: 12 (ref 5–15)
BUN: 12 mg/dL (ref 8–23)
CO2: 26 mmol/L (ref 22–32)
Calcium: 9.8 mg/dL (ref 8.9–10.3)
Chloride: 97 mmol/L — ABNORMAL LOW (ref 98–111)
Creatinine, Ser: 0.5 mg/dL — ABNORMAL LOW (ref 0.61–1.24)
GFR, Estimated: >60 mL/min (ref >60)
Glucose, Bld: 227 mg/dL — ABNORMAL HIGH (ref 70–99)
Potassium: 3.8 mmol/L (ref 3.5–5.1)
Sodium: 135 mmol/L (ref 135–145)

## 2022-07-13 LAB — HEMOGLOBIN A1C
Hgb A1c MFr Bld: 9.2 % — ABNORMAL HIGH (ref 4.8–5.6)
Mean Plasma Glucose: 217.34 mg/dL

## 2022-07-14 NOTE — Progress Notes (Signed)
  Perioperative Services: Pre-Admission/Anesthesia Testing  Abnormal Lab Notification    Date: 07/14/22  Name: Shawn Atkins MRN:   [id number]  Re: Abnormal labs noted during PAT appointment   Provider(s) Notified: [name], MD Notification mode: Routed and/or faxed via [facility]   ABNORMAL LAB VALUE(S): Lab Results  Component Value Date   GLUCOSE 227 (H) 07/13/2022    Notes: Patient with a T2DM diagnosis. He is currently on oral metformin monotherapy. Last Hgb A1c was 9.2% on 07/13/2022. In efforts to reduce his risk of developing SSI, or other potential perioperative complications, this communication is being sent in order to determine if patient is deemed to have adequate medical optimization, including preoperative glycemic control.   With that being said, the benefit of improving glycemic control must be weighed against the overall risk associated with delaying a necessary elective orthopedic surgery for this patient.   Honor Loh, MSN, APRN, FNP-C, CEN [facility]  Peri-operative Services Nurse Practitioner Phone: [phone] 07/14/22 6:11 AM

## 2022-07-20 ENCOUNTER — Ambulatory Visit: Payer: Medicare Other | Admitting: Urology

## 2022-07-27 ENCOUNTER — Ambulatory Visit: Payer: Medicare Other | Admitting: Urology

## 2022-07-27 ENCOUNTER — Encounter: Payer: Self-pay | Admitting: Urology

## 2022-07-27 VITALS — BP 116/72 | HR 99 | Ht 69.0 in | Wt 200.0 lb

## 2022-07-27 DIAGNOSIS — E349 Endocrine disorder, unspecified: Secondary | ICD-10-CM

## 2022-07-27 DIAGNOSIS — E291 Testicular hypofunction: Secondary | ICD-10-CM | POA: Diagnosis not present

## 2022-07-27 MED ORDER — SYRINGE 2-3 ML 3 ML MISC: 1.0000 mg | 3 refills | Status: DC

## 2022-07-27 MED ORDER — "BD DISP NEEDLES 18G X 1-1/2"" MISC": 1.0000 mg | 0 refills | Status: DC

## 2022-07-27 MED ORDER — "BD DISP NEEDLES 21G X 1-1/2"" MISC": 1.0000 mg | 0 refills | Status: DC

## 2022-07-27 NOTE — Progress Notes (Signed)
Shawn Atkins is a 66 y.o.  male with testosterone deficiency who presents today for instruction on delivering an IM injection of testosterone cypionate with his wife.  His wife will perform the injections.    I instructed the patient's wife to identify the concentration of his testosterone. Testosterone for injection is usually in the form of testosterone cypionate. These liquids come in multiple concentrations, so before giving an injection, it's very important to make sure that his intended dosage takes into account the concentration of the testosterone serum. Usually, testosterone comes in a concentration of either 100 mg/ml or 200 mg/ml.  We typically use the 200 mg/mL in this office.    Using a sterile, 18 G needle and 3 cc syringe, the testosterone cypionate (Lot #[id number], exp. 03/2025) was drawn up for a 1  cc injection.  To draw up the dose, I demonstrated how to first draw air into the syringe equal to the volume of the dosage. Then, wipe the top of the medication bottle with an alcohol wipe, insert the needle through the lid and into the medication, and push the air from your syringe into the bottle. Turn the bottle upside down and draw out the exact dosage of testosterone.  I demonstrated how to aspirate the syringe by hinge the syringe with its needle uncapped and pointing up in front of him.  Looking for air bubbles in the syringe. Flick the side of the syringe to get these bubbles to rise to the top.    When the dosage is bubble-free, I slowly depressed the plunger to force the air at the top of the syringe out stopping when a tiny drop of medication comes out of the tip of the syringe.  I advised him to be certain no air remained in the syringe as injecting air is very dangerous.  Being careful not to squirt or spray a significant portion of the dosage onto the floor.  Preparing the injection site, the upper outer corner of the right buttock, I took a sterile alcohol pad and wipe the  immediate area around where I intended him to inject.   I then demonstrated how to change the needle from the 18 G to the 21 G needle.  I then gave her the syringe for injecting.  She correctly identified the injection location.  She held the syringe like a dart at a 90-degree angle above the sterile injection site. Quickly plunged it into the flesh. Before depressing the plunger, she drew back on it slightly and no blood was seen.  I advised her that if blood flashed in the syringe, she would need to remove the needle and then select a different location as she was in the vein.  Inject the medication at a steady, controlled pace.  She fully depressed the plunger, slowly pull the needle out. Pressing around the injection site with a sterile cotton swab as she did so - this preventing the emerging needle from pulling on the skin and causing extra pain.  We assessed the needle entry point for bleeding, and applied a sterile Band-Aid and/or cotton swab if needed. Disposed of the used needle and syringe in a proper sharps container.  I advised him to acquire a sharps container for his personal use at home.  I advised him that If, after injection, he experienced redness, swelling, or discomfort beyond that of normal soreness at the site of injection, call our office for an appointment and instructions.  He is to always store  his medication at the recommended temperature, and always check the expiration date on the bottle. If it's expired, don't use it.  Of course, keep all of med's out of reach of children.  Do not change his dose without consulting your provider.  His starting dose will be 1 cc every 2 weeks.  He will return 1 week after his fourth injection for a testosterone level  Shawn Koren, PA-C  I spent 15 minutes on the day of the encounter to include pre-visit record review, face-to-face time with the patient, and post-visit ordering of tests.

## 2022-07-30 ENCOUNTER — Encounter
Admission: RE | Admit: 2022-07-30 | Discharge: 2022-07-30 | Disposition: A | Discharge status: Home / Self Care | Payer: Medicare Other | Source: Ambulatory Visit | Attending: Orthopedic Surgery | Admitting: Orthopedic Surgery

## 2022-07-30 NOTE — Patient Instructions (Addendum)
Your procedure is scheduled on:08-06-22 Thursday Report to the Registration Desk on the 1st floor of the [facility].Then proceed to the 2nd floor Surgery Desk To find out your arrival time, please call [phone] between 1PM - 3PM on:08-05-22 Wednesday If your arrival time is 6:00 am, do not arrive prior to that time as the [facility] entrance doors do not open until 6:00 am.  REMEMBER: Instructions that are not followed completely may result in serious medical risk, up to and including death; or upon the discretion of your surgeon and anesthesiologist your surgery may need to be rescheduled.  Do not eat food after midnight the night before surgery.  No gum chewing, lozengers or hard candies.  You may however, drink Water up to 2 hours before you are scheduled to arrive for your surgery. Do not drink anything within 2 hours of your scheduled arrival time.  Type 1 and Type 2 diabetics should only drink water.  Do NOT take any medication the day of surgery  Stop your metFORMIN (GLUCOPHAGE-XR) 2 days prior to surgery-Last dose will be on 08-03-22 Monday  One week prior to surgery: Stop Anti-inflammatories (NSAIDS) such as Advil, Aleve, Ibuprofen, Motrin, Naproxen, Naprosyn and Aspirin based products such as Excedrin, Goodys Powder, BC Powder.You may however, continue to take Tylenol if needed for pain up until the day of surgery.  Stop ANY OVER THE COUNTER supplements/vitamins NOW (07-30-22) until after surgery (Biotin, Glucosamine-Chondroitin and multivitamin)  No Alcohol for 24 hours before or after surgery.  No Smoking including e-cigarettes for 24 hours prior to surgery.  No chewable tobacco products for at least 6 hours prior to surgery.  No nicotine patches on the day of surgery.  Do not use any "recreational" drugs for at least a week prior to your surgery.  Please be advised that the combination of cocaine and anesthesia may have negative outcomes, up to and including  death. If you test positive for cocaine, your surgery will be cancelled.  On the morning of surgery brush your teeth with toothpaste and water, you may rinse your mouth with mouthwash if you wish. Do not swallow any toothpaste or mouthwash.  Use CHG Soap as directed on instruction sheet.  Do not wear jewelry, make-up, hairpins, clips or nail polish.  Do not wear lotions, powders, or perfumes.   Do not shave body from the neck down 48 hours prior to surgery just in case you cut yourself which could leave a site for infection.  Also, freshly shaved skin may become irritated if using the CHG soap.  Contact lenses, hearing aids and dentures may not be worn into surgery.  Do not bring valuables to the hospital. [facility] is not responsible for any missing/lost belongings or valuables.   Bring your C-PAP to the hospital with you   Notify your doctor if there is any change in your medical condition (cold, fever, infection).  Wear comfortable clothing (specific to your surgery type) to the hospital.  After surgery, you can help prevent lung complications by doing breathing exercises.  Take deep breaths and cough every 1-2 hours. Your doctor may order a device called an Incentive Spirometer to help you take deep breaths. When coughing or sneezing, hold a pillow firmly against your incision with both hands. This is called "splinting." Doing this helps protect your incision. It also decreases belly discomfort.  If you are being admitted to the hospital overnight, leave your suitcase in the car. After surgery it may be brought  to your room.  If you are being discharged the day of surgery, you will not be allowed to drive home. You will need a responsible adult (18 years or older) to drive you home and stay with you that night.   If you are taking public transportation, you will need to have a responsible adult (18 years or older) with you. Please confirm with your physician that it is  acceptable to use public transportation.   Please call the [facility] Dept. at [phone] if you have any questions about these instructions.  Surgery Visitation Policy:  Patients undergoing a surgery or procedure may have two family members or support persons with them as long as the person is not COVID-19 positive or experiencing its symptoms.

## 2022-08-06 ENCOUNTER — Ambulatory Visit: Payer: Medicare Other | Admitting: Urgent Care

## 2022-08-06 ENCOUNTER — Other Ambulatory Visit: Payer: Self-pay

## 2022-08-06 ENCOUNTER — Ambulatory Visit: Payer: Medicare Other

## 2022-08-06 ENCOUNTER — Ambulatory Visit
Admission: RE | Admit: 2022-08-06 | Discharge: 2022-08-06 | Disposition: A | Discharge status: Home / Self Care | Payer: Medicare Other | Attending: Orthopedic Surgery | Admitting: Orthopedic Surgery

## 2022-08-06 ENCOUNTER — Encounter: Payer: Self-pay | Admitting: Orthopedic Surgery

## 2022-08-06 ENCOUNTER — Encounter
Admission: RE | Disposition: A | Discharge status: Home / Self Care | Payer: Self-pay | Source: Home / Self Care | Attending: Orthopedic Surgery

## 2022-08-06 DIAGNOSIS — Z6829 Body mass index (BMI) 29.0-29.9, adult: Secondary | ICD-10-CM | POA: Insufficient documentation

## 2022-08-06 DIAGNOSIS — Z01818 Encounter for other preprocedural examination: Secondary | ICD-10-CM

## 2022-08-06 DIAGNOSIS — E119 Type 2 diabetes mellitus without complications: Secondary | ICD-10-CM | POA: Insufficient documentation

## 2022-08-06 DIAGNOSIS — X58XXXA Exposure to other specified factors, initial encounter: Secondary | ICD-10-CM | POA: Insufficient documentation

## 2022-08-06 DIAGNOSIS — E669 Obesity, unspecified: Secondary | ICD-10-CM | POA: Diagnosis not present

## 2022-08-06 DIAGNOSIS — G4730 Sleep apnea, unspecified: Secondary | ICD-10-CM | POA: Diagnosis not present

## 2022-08-06 DIAGNOSIS — Z7984 Long term (current) use of oral hypoglycemic drugs: Secondary | ICD-10-CM | POA: Insufficient documentation

## 2022-08-06 DIAGNOSIS — S46011A Strain of muscle(s) and tendon(s) of the rotator cuff of right shoulder, initial encounter: Secondary | ICD-10-CM | POA: Diagnosis present

## 2022-08-06 DIAGNOSIS — Z87891 Personal history of nicotine dependence: Secondary | ICD-10-CM | POA: Diagnosis not present

## 2022-08-06 DIAGNOSIS — I10 Essential (primary) hypertension: Secondary | ICD-10-CM | POA: Diagnosis not present

## 2022-08-06 HISTORY — PX: BICEPT TENODESIS: SHX5116

## 2022-08-06 HISTORY — PX: SHOULDER ARTHROSCOPY WITH OPEN ROTATOR CUFF REPAIR AND DISTAL CLAVICLE ACROMINECTOMY: SHX5683

## 2022-08-06 LAB — GLUCOSE, CAPILLARY
Glucose-Capillary: 228 mg/dL — ABNORMAL HIGH (ref 70–99)
Glucose-Capillary: 263 mg/dL — ABNORMAL HIGH (ref 70–99)

## 2022-08-06 SURGERY — SHOULDER ARTHROSCOPY WITH OPEN ROTATOR CUFF REPAIR AND DISTAL CLAVICLE ACROMINECTOMY
Anesthesia: General | Laterality: Right

## 2022-08-06 MED ORDER — OXYCODONE HCL 5 MG PO TABS
ORAL_TABLET | ORAL | Status: AC
  Filled 2022-08-06: qty 1

## 2022-08-06 MED ORDER — ACETAMINOPHEN 500 MG PO TABS
1000.0000 mg | ORAL_TABLET | ORAL | Status: AC
  Administered 2022-08-06: 1000 mg via ORAL

## 2022-08-06 MED ORDER — PROPOFOL 10 MG/ML IV BOLUS
INTRAVENOUS | Status: AC
  Filled 2022-08-06: qty 20

## 2022-08-06 MED ORDER — ROCURONIUM BROMIDE 10 MG/ML (PF) SYRINGE
PREFILLED_SYRINGE | INTRAVENOUS | Status: AC
  Filled 2022-08-06: qty 10

## 2022-08-06 MED ORDER — MIDAZOLAM HCL 2 MG/2ML IJ SOLN
INTRAMUSCULAR | Status: DC | PRN
  Administered 2022-08-06: 2 mg via INTRAVENOUS

## 2022-08-06 MED ORDER — BUPIVACAINE LIPOSOME 1.3 % IJ SUSP
INTRAMUSCULAR | Status: AC
  Filled 2022-08-06: qty 20

## 2022-08-06 MED ORDER — ONDANSETRON HCL 4 MG/2ML IJ SOLN
INTRAMUSCULAR | Status: AC
  Filled 2022-08-06: qty 2

## 2022-08-06 MED ORDER — OXYCODONE HCL 5 MG/5ML PO SOLN: 5.0000 mg | Freq: Once | ORAL | Status: AC | PRN

## 2022-08-06 MED ORDER — RINGERS IRRIGATION IR SOLN
Status: DC | PRN
  Administered 2022-08-06: 6000 mL

## 2022-08-06 MED ORDER — EPINEPHRINE PF 1 MG/ML IJ SOLN
INTRAMUSCULAR | Status: AC
  Filled 2022-08-06: qty 4

## 2022-08-06 MED ORDER — ONDANSETRON HCL 4 MG PO TABS: 4.0000 mg | ORAL_TABLET | Freq: Three times a day (TID) | ORAL | 0 refills | Status: DC | PRN

## 2022-08-06 MED ORDER — LIDOCAINE HCL (PF) 2 % IJ SOLN
INTRAMUSCULAR | Status: AC
  Filled 2022-08-06: qty 5

## 2022-08-06 MED ORDER — DEXAMETHASONE SODIUM PHOSPHATE 10 MG/ML IJ SOLN
INTRAMUSCULAR | Status: AC
  Filled 2022-08-06: qty 1

## 2022-08-06 MED ORDER — FAMOTIDINE 20 MG PO TABS
ORAL_TABLET | ORAL | Status: AC
  Filled 2022-08-06: qty 1

## 2022-08-06 MED ORDER — CHLORHEXIDINE GLUCONATE 0.12 % MT SOLN
OROMUCOSAL | Status: AC
  Filled 2022-08-06: qty 15

## 2022-08-06 MED ORDER — VANCOMYCIN HCL IN DEXTROSE 1-5 GM/200ML-% IV SOLN
1000.0000 mg | INTRAVENOUS | Status: AC
  Administered 2022-08-06: 1000 mg via INTRAVENOUS

## 2022-08-06 MED ORDER — SUGAMMADEX SODIUM 200 MG/2ML IV SOLN
INTRAVENOUS | Status: DC | PRN
  Administered 2022-08-06: 200 mg via INTRAVENOUS

## 2022-08-06 MED ORDER — BUPIVACAINE HCL (PF) 0.5 % IJ SOLN
INTRAMUSCULAR | Status: DC | PRN
  Administered 2022-08-06: 15 mL

## 2022-08-06 MED ORDER — OXYCODONE HCL 5 MG PO TABS: 5.0000 mg | ORAL_TABLET | ORAL | 0 refills | Status: DC | PRN

## 2022-08-06 MED ORDER — FENTANYL CITRATE (PF) 100 MCG/2ML IJ SOLN
25.0000 ug | INTRAMUSCULAR | Status: DC | PRN
  Administered 2022-08-06 (×3): 50 ug via INTRAVENOUS

## 2022-08-06 MED ORDER — PHENYLEPHRINE 80 MCG/ML (10ML) SYRINGE FOR IV PUSH (FOR BLOOD PRESSURE SUPPORT)
PREFILLED_SYRINGE | INTRAVENOUS | Status: AC
  Filled 2022-08-06: qty 10

## 2022-08-06 MED ORDER — FENTANYL CITRATE (PF) 100 MCG/2ML IJ SOLN
INTRAMUSCULAR | Status: AC
  Filled 2022-08-06: qty 2

## 2022-08-06 MED ORDER — ONDANSETRON HCL 4 MG/2ML IJ SOLN
INTRAMUSCULAR | Status: DC | PRN
  Administered 2022-08-06: 4 mg via INTRAVENOUS

## 2022-08-06 MED ORDER — ACETAMINOPHEN 500 MG PO TABS
ORAL_TABLET | ORAL | Status: AC
  Filled 2022-08-06: qty 2

## 2022-08-06 MED ORDER — BUPIVACAINE HCL (PF) 0.5 % IJ SOLN
INTRAMUSCULAR | Status: DC | PRN
  Administered 2022-08-06: 10 mL

## 2022-08-06 MED ORDER — CHLORHEXIDINE GLUCONATE CLOTH 2 % EX PADS: 6.0000 | MEDICATED_PAD | Freq: Once | CUTANEOUS | Status: DC

## 2022-08-06 MED ORDER — LACTATED RINGERS IR SOLN
Status: DC | PRN
  Administered 2022-08-06: 12000 mL

## 2022-08-06 MED ORDER — ROCURONIUM BROMIDE 100 MG/10ML IV SOLN
INTRAVENOUS | Status: DC | PRN
  Administered 2022-08-06: 60 mg via INTRAVENOUS
  Administered 2022-08-06: 10 mg via INTRAVENOUS

## 2022-08-06 MED ORDER — DEXAMETHASONE SODIUM PHOSPHATE 10 MG/ML IJ SOLN
INTRAMUSCULAR | Status: DC | PRN
  Administered 2022-08-06: 5 mg via INTRAVENOUS

## 2022-08-06 MED ORDER — GLYCOPYRROLATE 0.2 MG/ML IJ SOLN
INTRAMUSCULAR | Status: AC
  Filled 2022-08-06: qty 1

## 2022-08-06 MED ORDER — VANCOMYCIN HCL IN DEXTROSE 1-5 GM/200ML-% IV SOLN
INTRAVENOUS | Status: AC
  Filled 2022-08-06: qty 200

## 2022-08-06 MED ORDER — BUPIVACAINE HCL (PF) 0.5 % IJ SOLN
INTRAMUSCULAR | Status: AC
  Filled 2022-08-06: qty 10

## 2022-08-06 MED ORDER — PROPOFOL 10 MG/ML IV BOLUS
INTRAVENOUS | Status: DC | PRN
  Administered 2022-08-06: 120 mg via INTRAVENOUS

## 2022-08-06 MED ORDER — FENTANYL CITRATE PF 50 MCG/ML IJ SOSY
PREFILLED_SYRINGE | INTRAMUSCULAR | Status: AC
  Administered 2022-08-06: 50 ug via INTRAVENOUS
  Filled 2022-08-06: qty 1

## 2022-08-06 MED ORDER — FENTANYL CITRATE (PF) 100 MCG/2ML IJ SOLN
INTRAMUSCULAR | Status: DC | PRN
  Administered 2022-08-06: 50 ug via INTRAVENOUS

## 2022-08-06 MED ORDER — FAMOTIDINE 20 MG PO TABS
20.0000 mg | ORAL_TABLET | Freq: Once | ORAL | Status: AC
  Administered 2022-08-06: 20 mg via ORAL

## 2022-08-06 MED ORDER — KETAMINE HCL 10 MG/ML IJ SOLN
INTRAMUSCULAR | Status: DC | PRN
  Administered 2022-08-06 (×3): 10 mg via INTRAVENOUS
  Administered 2022-08-06: 20 mg via INTRAVENOUS

## 2022-08-06 MED ORDER — MIDAZOLAM HCL 2 MG/2ML IJ SOLN
INTRAMUSCULAR | Status: AC
  Filled 2022-08-06: qty 2

## 2022-08-06 MED ORDER — OXYCODONE HCL 5 MG PO TABS
5.0000 mg | ORAL_TABLET | Freq: Once | ORAL | Status: AC | PRN
  Administered 2022-08-06: 5 mg via ORAL

## 2022-08-06 MED ORDER — PHENYLEPHRINE HCL (PRESSORS) 10 MG/ML IV SOLN
INTRAVENOUS | Status: DC | PRN
  Administered 2022-08-06 (×3): 80 ug via INTRAVENOUS

## 2022-08-06 MED ORDER — SODIUM CHLORIDE 0.9 % IV SOLN: INTRAVENOUS | Status: DC

## 2022-08-06 MED ORDER — GLYCOPYRROLATE 0.2 MG/ML IJ SOLN
INTRAMUSCULAR | Status: DC | PRN
  Administered 2022-08-06: .1 mg via INTRAVENOUS

## 2022-08-06 MED ORDER — BUPIVACAINE LIPOSOME 1.3 % IJ SUSP
INTRAMUSCULAR | Status: DC | PRN
  Administered 2022-08-06: 20 mL

## 2022-08-06 MED ORDER — ORAL CARE MOUTH RINSE: 15.0000 mL | Freq: Once | OROMUCOSAL | Status: AC

## 2022-08-06 MED ORDER — CHLORHEXIDINE GLUCONATE 0.12 % MT SOLN
15.0000 mL | Freq: Once | OROMUCOSAL | Status: AC
  Administered 2022-08-06: 15 mL via OROMUCOSAL

## 2022-08-06 MED ORDER — KETAMINE HCL 50 MG/5ML IJ SOSY
PREFILLED_SYRINGE | INTRAMUSCULAR | Status: AC
  Filled 2022-08-06: qty 5

## 2022-08-06 MED ORDER — FENTANYL CITRATE PF 50 MCG/ML IJ SOSY: 50.0000 ug | PREFILLED_SYRINGE | Freq: Once | INTRAMUSCULAR | Status: AC

## 2022-08-06 MED ORDER — BUPIVACAINE HCL (PF) 0.25 % IJ SOLN
INTRAMUSCULAR | Status: AC
  Filled 2022-08-06: qty 30

## 2022-08-06 SURGICAL SUPPLY — 67 items
ADAPTER IRRIG TUBE 2 SPIKE SOL (ADAPTER) ×2 IMPLANT
ADPR TBG 2 SPK PMP STRL ASCP (ADAPTER) ×2
ANCH SUT 5.5 KNTLS PEEK (Orthopedic Implant) ×2 IMPLANT
ANCH SUT Q-FX 2.8 (Anchor) ×2 IMPLANT
ANCHOR ALL-SUT Q-FIX 2.8 (Anchor) ×4 IMPLANT
ANCHOR SUT 5.5 MULTIFIX (Orthopedic Implant) IMPLANT
CANNULA 5.75X7 CRYSTAL CLEAR (CANNULA) ×2 IMPLANT
CANNULA PARTIAL THREAD 2X7 (CANNULA) ×1 IMPLANT
CANNULA TWIST IN 8.25X9CM (CANNULA) ×2 IMPLANT
CONNECTOR PERFECT PASSER (CONNECTOR) ×2 IMPLANT
COOLER POLAR GLACIER W/PUMP (MISCELLANEOUS) ×1 IMPLANT
DRAPE 3/4 80X56 (DRAPES) ×1 IMPLANT
DRAPE INCISE IOBAN 66X45 STRL (DRAPES) ×1 IMPLANT
DRAPE U-SHAPE 47X51 STRL (DRAPES) ×1 IMPLANT
DURAPREP 26ML APPLICATOR (WOUND CARE) ×3 IMPLANT
ELECT REM PT RETURN 9FT ADLT (ELECTROSURGICAL) ×1
ELECTRODE REM PT RTRN 9FT ADLT (ELECTROSURGICAL) ×1 IMPLANT
GAUZE SPONGE 4X4 12PLY STRL (GAUZE/BANDAGES/DRESSINGS) ×1 IMPLANT
GAUZE XEROFORM 1X8 LF (GAUZE/BANDAGES/DRESSINGS) ×1 IMPLANT
GLOVE BIOGEL PI IND STRL 9 (GLOVE) ×1 IMPLANT
GLOVE BIOGEL PI ORTHO SZ9 (GLOVE) ×6 IMPLANT
GOWN STRL REUS TWL 2XL XL LVL4 (GOWN DISPOSABLE) ×1 IMPLANT
GOWN STRL REUS W/ TWL LRG LVL3 (GOWN DISPOSABLE) ×1 IMPLANT
GOWN STRL REUS W/TWL LRG LVL3 (GOWN DISPOSABLE) ×1
IV LACTATED RINGER IRRG 3000ML (IV SOLUTION) ×2
IV LR IRRIG 3000ML ARTHROMATIC (IV SOLUTION) ×8 IMPLANT
KIT STABILIZATION SHOULDER (MISCELLANEOUS) ×1 IMPLANT
KIT SUTURE 2.8 Q-FIX DISP (MISCELLANEOUS) ×1 IMPLANT
KIT SUTURETAK 3.0 INSERT PERC (KITS) IMPLANT
KIT TURNOVER KIT A (KITS) ×1 IMPLANT
MANIFOLD NEPTUNE II (INSTRUMENTS) ×2 IMPLANT
MASK FACE SPIDER DISP (MASK) ×1 IMPLANT
MAT ABSORB  FLUID 56X50 GRAY (MISCELLANEOUS) ×2
MAT ABSORB FLUID 56X50 GRAY (MISCELLANEOUS) ×3 IMPLANT
NDL SAFETY ECLIP 18X1.5 (MISCELLANEOUS) ×1 IMPLANT
NEEDLE HYPO 22GX1.5 SAFETY (NEEDLE) ×1 IMPLANT
NS IRRIG 500ML POUR BTL (IV SOLUTION) ×1 IMPLANT
PACK ARTHROSCOPY SHOULDER (MISCELLANEOUS) ×1 IMPLANT
PAD ABD DERMACEA PRESS 5X9 (GAUZE/BANDAGES/DRESSINGS) ×1 IMPLANT
PAD ARMBOARD 7.5X6 YLW CONV (MISCELLANEOUS) ×2 IMPLANT
PAD WRAPON POLAR SHDR XLG (MISCELLANEOUS) ×1 IMPLANT
PASSER SUT FIRSTPASS SELF (INSTRUMENTS) ×1 IMPLANT
SHAVER BLADE BONE CUTTER 4.5 (BLADE) ×1 IMPLANT
SHAVER BLADE TAPERED BLUNT 4 (BLADE) ×1 IMPLANT
SLEEVE REMOTE CONTROL 5X12 (DRAPES) IMPLANT
SPONGE T-LAP 18X18 ~~LOC~~+RFID (SPONGE) ×1 IMPLANT
STRIP CLOSURE SKIN 1/2X4 (GAUZE/BANDAGES/DRESSINGS) ×1 IMPLANT
SUT ETHILON 4-0 (SUTURE) ×1
SUT ETHILON 4-0 FS2 18XMFL BLK (SUTURE) ×1
SUT MNCRL 4-0 (SUTURE) ×1
SUT MNCRL 4-0 27XMFL (SUTURE) ×1
SUT PDS AB 0 CT1 27 (SUTURE) ×3 IMPLANT
SUT PERFECTPASSER WHITE CART (SUTURE) ×4 IMPLANT
SUT SMART STITCH CARTRIDGE (SUTURE) ×4 IMPLANT
SUT VIC AB 0 CT1 36 (SUTURE) ×3 IMPLANT
SUT VIC AB 2-0 CT2 27 (SUTURE) ×1 IMPLANT
SUTURE ETHLN 4-0 FS2 18XMF BLK (SUTURE) ×1 IMPLANT
SUTURE MNCRL 4-0 27XMF (SUTURE) ×1 IMPLANT
SYR 10ML LL (SYRINGE) ×1 IMPLANT
TAPE MICROFOAM 4IN (TAPE) ×1 IMPLANT
TRAP FLUID SMOKE EVACUATOR (MISCELLANEOUS) ×1 IMPLANT
TUBING CONNECTING 10 (TUBING) ×1 IMPLANT
TUBING INFLOW SET DBFLO PUMP (TUBING) ×1 IMPLANT
TUBING OUTFLOW SET DBLFO PUMP (TUBING) ×1 IMPLANT
WAND WEREWOLF FLOW 90D (MISCELLANEOUS) ×1 IMPLANT
WATER STERILE IRR 500ML POUR (IV SOLUTION) ×1 IMPLANT
WRAPON POLAR PAD SHDR XLG (MISCELLANEOUS) ×1

## 2022-08-06 NOTE — Anesthesia Preprocedure Evaluation (Addendum)
Anesthesia Evaluation  Patient identified by MRN, date of birth, ID band Patient awake    Reviewed: Allergy & Precautions, NPO status , Patient's Chart, lab work & pertinent test results, reviewed documented beta blocker date and time   History of Anesthesia Complications Negative for: history of anesthetic complications  Airway Mallampati: III   Neck ROM: Full    Dental  (+) Dental Advidsory Given, Missing,    Pulmonary sleep apnea, Continuous Positive Airway Pressure Ventilation and Oxygen sleep apnea , neg COPD, former smoker,    Pulmonary exam normal breath sounds clear to auscultation       Cardiovascular hypertension, (-) angina(-) Past MI, (-) CABG and (-) DOE Normal cardiovascular exam Rhythm:Regular Rate:Normal     Neuro/Psych Vertigo     GI/Hepatic neg GERD  ,  Endo/Other  diabetes, Type 2  Renal/GU Renal disease (nephrolithiasis)     Musculoskeletal   Abdominal (+) + obese (BMI 30),   Peds  Hematology   Anesthesia Other Findings BPH  Reproductive/Obstetrics                             Anesthesia Physical  Anesthesia Plan  ASA: 2  Anesthesia Plan: General/Spinal   Post-op Pain Management: Regional block*   Induction: Intravenous  PONV Risk Score and Plan: 2 and Midazolam, Treatment may vary due to age or medical condition, Ondansetron and Dexamethasone  Airway Management Planned: Nasal Cannula  Additional Equipment:   Intra-op Plan:   Post-operative Plan: Extubation in OR  Informed Consent: I have reviewed the patients History and Physical, chart, labs and discussed the procedure including the risks, benefits and alternatives for the proposed anesthesia with the patient or authorized representative who has indicated his/her understanding and acceptance.     Dental Advisory Given  Plan Discussed with: CRNA  Anesthesia Plan Comments: (Patient consented for risks  of anesthesia including but not limited to:  - adverse reactions to medications - damage to eyes, teeth, lips or other oral mucosa - nerve damage due to positioning  - sore throat or hoarseness - Damage to heart, brain, nerves, lungs, other parts of body or loss of life  Patient voiced understanding.)       Anesthesia Quick Evaluation

## 2022-08-06 NOTE — Op Note (Signed)
08/06/2022  5:36 PM  PATIENT:  Shawn Atkins  66 y.o. male  PRE-OPERATIVE DIAGNOSIS:  Right Shoulder Rotator Cuff Tear  POST-OPERATIVE DIAGNOSIS:  Right Shoulder Rotator Cuff Tear  PROCEDURE:  Procedure(s): Right shoulder arthroscopic superior labral debridement, subacromial decompression, distal clavicle excision and mini open rotator cuff repair  SURGEON:  Surgeon(s) and Role:    [name], MD - Primary  ANESTHESIA:   local, general, and paracervical block   PREOPERATIVE INDICATIONS:  ONA RATHERT is a  66 y.o. male with a diagnosis of Right Shoulder Rotator Cuff Tear, confirmed by MRI, who failed conservative treatment and elected for surgical fixation of his tear.    The risks benefits and alternatives were discussed with the patient preoperatively including but not limited to the risks of infection, bleeding, nerve injury, persistent pain or weakness, shoulder stiffness/arthrofibrosis, failure of the repair, re-tear of the rotator cuff and the need for further surgery. Medical risks include DVT and pulmonary embolism, myocardial infarction, stroke, pneumonia, respiratory failure and death. Patient understood these risks and wished to proceed.  OPERATIVE IMPLANTS: Smith [employer] Nephew multi fix anchors x 2 & [employer] Q Fix anchors x 2  OPERATIVE FINDINGS: Patient had a frayed superior labrum without detachment from the glenoid.  There is no SLAP tear or tear of the biceps tendon.  Patient had a low-grade partial tear of the subscapularis in line with its fibers and without detachment from the lesser tuberosity.  Patient had no anterior posterior labral tear.  There is no focal chondral lesions of the glenohumeral joint.  Patient had extensive subacromial bursitis with subacromial spurring.  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  Patient had a high-grade partial-thickness tear of the supraspinatus originating from the bursal surface.  OPERATIVE  PROCEDURE: The patient and his wife were met in the preoperative area. The patient's right shoulder was signed with the word yes and my initials according the hospital's correct site of surgery protocol. A pre-op history and physical was performed at the bedside.  Patient underwent an interscalene block with Exparel by the anesthesia service.  Patient was brought to the operating room where he underwent general anesthesia. He received 1 g of vancomycin IV prior to the onset of the case due to his significant penicillin allergy.  The patient was placed in a beachchair position.  A spider arm positioner was used for this case.  Examination under anesthesia revealed no loss of passive range of motion or instability with load shift testing. The patient had a negative sulcus sign.  Patient was prepped and draped in a sterile fashion. A timeout was performed to verify the patient's name, date of birth, medical record number, correct site of surgery and correct procedure to be performed there was also used to verify the patient received antibiotics that all appropriate instruments, implants and radiographs studies were available in the room. Once all in attendance were in agreement case began.    Bony landmarks were drawn out with a surgical marker along with proposed arthroscopy incisions.  An 11 blade was used to establish a posterior portal through which the arthroscope was placed in the glenohumeral joint.  An anterior portal was then established using an 18-gauge spinal needle for localization.  A 5.75 mm arthroscopic cannula was placed to the anterior portal.  A full diagnostic examination of the shoulder was performed.  Findings on arthroscopy are listed above.  The arthroscope was then placed in the subacromial space.  Extensive bursitis was encountered and debrided using a Dyon[employer] tapered shaver blade and a [place] werewolf wand from a lateral portal which was established under direct  visualization using an 18-gauge spinal needle. A subacromial decompression was performed sing a Dyonics 4.5 mm bone cutter shaver blade from the lateral portal.   A distal clavicle excision was then performed through the anterior portal also using the 4.5 mm bone cutter shaver blade, which was also used to debride the greater tuberosity of all torn fibers of the rotator cuff. Two Perfect Pass sutures were placed in the lateral border of the rotator cuff tear. All arthroscopic instruments were then removed and the mini-open portion of the procedure began.  A saber-type incision was made along the lateral border of the acromion. The deltoid muscle was identified and split in line with its fibers which allowed visualization of the rotator cuff. The Perfect Pass sutures previously placed in the lateral border of the rotator cuff were brought out through the deltoid split.  Two Smith and [employer] Fix anchors were placed at the articular margin of the humeral head with the greater tuberosity. The suture limbs of the Q Fix anchors were passed medially through the rotator cuff using a First Pass suture passer and clamped with a hemostat for later medial row fixation.  Two additional perfect sutures were placed in the lateral border of the rotator cuff.  The four Perfect Pass sutures were then anchored to the greater tuberosity footprint using two [employer] Multifix anchors. The sutures passed through the Multifix anchors were then tensioned to allow reduction of the rotator cuff to the greater tuberosity footprint. The medial row repair was then performed using the Q fix sutures, which were tied down using an arthroscopic knot tying technique.  Arthroscopic images of the repair were taken with the arthroscope both externally and from inside the glenohumeral joint.  All incisions were copiously irrigated. The deltoid fascia was repaired using a 0 Vicryl suture.  The subcutaneous tissue of all incisions were closed  with a 2-0 Vicryl. Skin closure for the arthroscopic incisions was performed with 4-0 nylon. The skin edges of the saber incision was approximated with a running 4-0 undyed Monocryl.  The subacromial space was then injected with 15 cc of 0.5% Marcaine plain to assist with postoperative pain management.  A dry sterile dressing was applied.  The patient was placed in an abduction sling and a Polar Care was applied to the shoulder.  All sharp, sponge and it instrument counts were correct at the conclusion of the case. I was scrubbed and present for the entire case. I spoke with the patient's wife postoperatively to let her know the case had been performed without complication and the patient was stable in recovery room.

## 2022-08-06 NOTE — Anesthesia Procedure Notes (Signed)
Anesthesia Regional Block: Interscalene brachial plexus block   Pre-Anesthetic Checklist: , timeout performed,  Correct Patient, Correct Site, Correct Laterality,  Correct Procedure, Correct Position, site marked,  Risks and benefits discussed,  Surgical consent,  Pre-op evaluation,  At surgeon's request and post-op pain management  Laterality: Right  Prep: chloraprep       Needles:  Injection technique: Single-shot  Needle Type: Echogenic Needle     Needle Length: 4cm  Needle Gauge: 25     Additional Needles:   Procedures:,,,, ultrasound used (permanent image in chart),,    Narrative:  Start time: 08/06/2022 11:10 AM End time: 08/06/2022 11:15 AM Injection made incrementally with aspirations every 5 mL.  Performed by: Personally  Anesthesiologist: Dimas Millin, MD  Additional Notes: Patient's chart reviewed and they were deemed appropriate candidate for procedure, at surgeon's request. Patient educated about risks, benefits, and alternatives of the block including but not limited to: temporary or permanent nerve damage, bleeding, infection, damage to surround tissues, pneumothorax, hemidiaphragmatic paralysis, unilateral Horner's syndrome, block failure, local anesthetic toxicity. Patient expressed understanding. A formal time-out was conducted consistent with institution rules.  Monitors were applied, and minimal sedation used (see nursing record). The site was prepped with skin prep and allowed to dry, and sterile gloves were used. A high frequency linear ultrasound probe with probe cover was utilized throughout. C5-7 nerve roots located and appeared anatomically normal, local anesthetic injected around them, and echogenic block needle trajectory was monitored throughout. Aspiration performed every 69m. Lung and blood vessels were avoided. All injections were performed without resistance and free of blood and paresthesias. The patient tolerated the procedure  well.  Injectate: 267mexparel + 1043m.5% bupivacaine

## 2022-08-06 NOTE — H&P (Signed)
PREOPERATIVE H&P  Chief Complaint: Right Shoulder Rotator Cuff Tear  HPI: Shawn Atkins is a 66 y.o. male who presents for preoperative history and physical with a diagnosis of Right Shoulder Rotator Cuff Tear.  Patient explains that he works at [employer] as he is retired.  Patient states at one point he was lifting a heavy bag to throw in the trash and he felt a pop in his shoulder and wonders whether this may be when he injured his shoulder.  Symptoms of pain, limited ROM and weakness are significantly impairing activities of daily living his ability to perform at work.  He has failed nonoperative management wished to proceed with surgical fixation of his rotator cuff tear.  Past Medical History:  Diagnosis Date   BPH (benign prostatic hypertrophy)    Bronchitis    Chronic prostatitis    Complication of anesthesia    Felt like "couldn't breathe" after triple Hernia surgery   Diabetes mellitus without complication (HCC)    Flank pain    GERD (gastroesophageal reflux disease)    h/o   Gross hematuria    History of kidney stones    HTN (hypertension)    pt states he takes lisinopril for kidney protection due to dm not htn   Inguinal hernia    Nocturia    OSA on CPAP    with 02 2 L   Overweight    PONV (postoperative nausea and vomiting)    only during kidney stone surgery   Spermatocele    Stricture of urethra    Vertigo    1 episode, 6-7 yrs ago   Past Surgical History:  Procedure Laterality Date   CATARACT EXTRACTION W/PHACO Left 07/22/2020   Procedure: CATARACT EXTRACTION PHACO AND INTRAOCULAR LENS PLACEMENT ([place]) LEFT DIABETIC 4.14  00:33.0;  Surgeon: Eulogio Bear, MD;  Location: [place];  Service: Ophthalmology;  Laterality: Left;  Diabetic - oral meds   CATARACT EXTRACTION W/PHACO Right 08/12/2020   Procedure: CATARACT EXTRACTION PHACO AND INTRAOCULAR LENS PLACEMENT ([place]) RIGHT DIABETIC;  Surgeon: Eulogio Bear, MD;  Location: [place];  Service: Ophthalmology;  Laterality: Right;  1.99 0:26.2   COLONOSCOPY WITH PROPOFOL N/A 11/20/2015   Procedure: COLONOSCOPY WITH PROPOFOL;  Surgeon: Christene Lye, MD;  Location: [facility];  Service: Endoscopy;  Laterality: N/A;   HERNIA REPAIR Bilateral 2202   umbilical and bil inguinal/ Dr Marina Gravel   KIDNEY STONE SURGERY     KNEE ARTHROSCOPY Left    open lithotomy     Social History   Socioeconomic History   Marital status: Married    Spouse name: Not on file   Number of children: Not on file   Years of education: Not on file   Highest education level: Not on file  Occupational History   Not on file  Tobacco Use   Smoking status: Former    Packs/day: 1.00    Years: 15.00    Total pack years: 15.00    Types: Cigarettes    Quit date: 5    Years since quitting: 38.8    Passive exposure: Past   Smokeless tobacco: Current    Types: Snuff  Vaping Use   Vaping Use: Never used  Substance and Sexual Activity   Alcohol use: Yes    Alcohol/week: 1.0 standard drink of alcohol    Types: 1 Cans of beer per week    Comment: 3 beers daily   Drug use: No  Sexual activity: Not on file  Other Topics Concern   Not on file  Social History Narrative   Not on file   Social Determinants of Health   Financial Resource Strain: Not on file  Food Insecurity: Not on file  Transportation Needs: Not on file  Physical Activity: Not on file  Stress: Not on file  Social Connections: Not on file   Family History  Problem Relation Age of Onset   Benign prostatic hyperplasia Father    Kidney disease Neg Hx    Prostate cancer Neg Hx    Allergies  Allergen Reactions   Penicillins Anaphylaxis   Morphine And Related Nausea And Vomiting   Prior to Admission medications   Medication Sig Start Date End Date Taking? Authorizing Provider  atorvastatin (LIPITOR) 20 MG tablet Take 20 mg by mouth every evening.   Yes [provider]  albuterol (PROVENTIL)  (2.5 MG/3ML) 0.083% nebulizer solution Take 2.5 mg by nebulization every 6 (six) hours as needed for wheezing or shortness of breath (Last used in the winter of 2022).    [provider]  BIOTIN PO Take 1 tablet by mouth daily.    [provider]  glucosamine-chondroitin 500-400 MG tablet Take 1 tablet by mouth 2 (two) times daily.    [provider]  lisinopril (ZESTRIL) 10 MG tablet Take 10 mg by mouth every morning. 10/01/20   [provider]  metFORMIN (GLUCOPHAGE-XR) 500 MG 24 hr tablet Take 1,000 mg by mouth 2 (two) times daily with a meal. 11/26/21   [provider]  Multiple Vitamin (MULTIVITAMIN) tablet Take 1 tablet by mouth daily.    [provider]  NEEDLE, DISP, 18 G (BD DISP NEEDLES) 18G X 1-1/2" MISC 1 mg by Does not apply route every 14 (fourteen) days. 07/27/22   McGowan, Larene Beach A, PA-C  NEEDLE, DISP, 21 G (BD DISP NEEDLES) 21G X 1-1/2" MISC 1 mg by Does not apply route every 14 (fourteen) days. 07/27/22   Zara Council A, PA-C  Syringe, Disposable, (2-3CC SYRINGE) 3 ML MISC 1 mg by Does not apply route every 14 (fourteen) days. 07/27/22   McGowan, Larene Beach A, PA-C  tadalafil (CIALIS) 5 MG tablet TAKE ONE TABLET BY MOUTH DAILY AS NEEDED FOR ERECTILE DYSFUNCTION Patient taking differently: Take 5 mg by mouth every morning. BPH 11/12/21   McGowan, Larene Beach A, PA-C  testosterone cypionate (DEPOTESTOSTERONE CYPIONATE) 200 MG/ML injection Inject 1 mL (200 mg total) into the muscle every 14 (fourteen) days. 07/06/22   McGowan, Larene Beach A, PA-C     Positive ROS: All other systems have been reviewed and were otherwise negative with the exception of those mentioned in the HPI and as above.  Physical Exam: General: Alert, no acute distress Cardiovascular: Regular rate and rhythm, no murmurs rubs or gallops.  No pedal edema Respiratory: Clear to auscultation bilaterally, no wheezes rales or rhonchi. No cyanosis, no use of accessory  musculature GI: No organomegaly, abdomen is soft and non-tender nondistended with positive bowel sounds. Skin: Skin intact, no lesions within the operative field. Neurologic: Sensation intact distally Psychiatric: Patient is competent for consent with normal mood and affect Lymphatic: No cervical lymphadenopathy  MUSCULOSKELETAL: Right shoulder: Patient has pain with forward elevation of 90 degrees and abduction of 80 degrees. He has pain with a downward directed force on his abducted shoulder and demonstrates weakness of shoulder abduction. He has positive impingement signs, but no apprehension or instability. He did not have significant weakness to shoulder internal  or external rotation with his arm by his side. He has full digital wrist and elbow range of motion, intact sensation to light touch, and palpable radial pulse. He has tenderness over the [facility] joint but no step-off.  Radiology: I reviewed the MRI images as well as the radiology report for the patient's right shoulder MRI. He has a partial thickness tear of the supraspinatus involving 75% depth from the bursal side. Patient has tendinosis of the supraspinatus as well. He has moderate subdeltoid bursitis. He has a small glenohumeral joint effusion and a subtle type II SLAP tear. The long head of the biceps appears to be intact. He has severe AC joint arthrosis and a downward sloping acromion.  Assessment: Right Shoulder Rotator Cuff Tear  Plan: Plan for Procedure(s): Right shoulder arthroscopic subacromial decompression, distal clavicle excision and mini open rotator cuff repair with possible biceps tenodesis versus tenotomy.  Reviewed the details of the operation as well as the postoperative course with the patient and his wife who is by his side today.  A preop history and physical was performed at the bedside.  I marked the right shoulder according hospital's correct site of surgery protocol.  Patient received an interscalene block with  Exparel by the anesthesia service preoperative area.  I discussed the risks and benefits of surgery. The risks include but are not limited to infection, bleeding, nerve or blood vessel injury, joint stiffness or loss of motion, persistent pain, weakness or instability, tear of the rotator cuff and hardware failure and the need for further surgery. Patient understood these risks and wished to proceed.     [name], MD   08/06/2022 12:11 PM

## 2022-08-06 NOTE — Transfer of Care (Signed)
Immediate Anesthesia Transfer of Care Note  Patient: Shawn Atkins  Procedure(s) Performed: SHOULDER ARTHROSCOPY WITH OPEN ROTATOR CUFF REPAIR AND DISTAL CLAVICLE ACROMINECTOMY (Right) BICEPS TENODESIS (Right)  Patient Location: PACU  Anesthesia Type:General  Level of Consciousness: drowsy  Airway & Oxygen Therapy: Patient Spontanous Breathing and Patient connected to face mask oxygen  Post-op Assessment: Report given to RN and Post -op Vital signs reviewed and stable  Post vital signs: Reviewed and stable  Last Vitals:  Vitals Value Taken Time  BP 139/81 08/06/22 1702  Temp 36.3 C 08/06/22 1702  Pulse 97 08/06/22 1706  Resp 16 08/06/22 1708  SpO2 94 % 08/06/22 1706  Vitals shown include unvalidated device data.  Last Pain:  Vitals:   08/06/22 0957  TempSrc: Temporal  PainSc: 0-No pain         Complications: No notable events documented.

## 2022-08-06 NOTE — Anesthesia Procedure Notes (Signed)
Procedure Name: Intubation Date/Time: 08/06/2022 2:21 PM  Performed by: Cammie Sickle, CRNAPre-anesthesia Checklist: Patient identified, Patient being monitored, Timeout performed, Emergency Drugs available and Suction available Patient Re-evaluated:Patient Re-evaluated prior to induction Oxygen Delivery Method: Circle system utilized Preoxygenation: Pre-oxygenation with 100% oxygen Induction Type: IV induction Ventilation: Mask ventilation without difficulty and Oral airway inserted - appropriate to patient size Laryngoscope Size: 3 and McGraph Grade View: Grade I Tube type: Oral Tube size: 7.5 mm Number of attempts: 1 Airway Equipment and Method: Stylet Placement Confirmation: ETT inserted through vocal cords under direct vision, positive ETCO2 and breath sounds checked- equal and bilateral Secured at: 22 cm Tube secured with: Tape Dental Injury: Teeth and Oropharynx as per pre-operative assessment

## 2022-08-06 NOTE — Discharge Instructions (Addendum)
AMBULATORY SURGERY  DISCHARGE INSTRUCTIONS   The drugs that you were given will stay in your system until tomorrow so for the next 24 hours you should not:  Drive an automobile Make any legal decisions Drink any alcoholic beverage   You may resume regular meals tomorrow.  Today it is better to start with liquids and gradually work up to solid foods.  You may eat anything you prefer, but it is better to start with liquids, then soup and crackers, and gradually work up to solid foods.   Please notify your doctor immediately if you have any unusual bleeding, trouble breathing, redness and pain at the surgery site, drainage, fever, or pain not relieved by medication.    Additional Instructions:        Please contact your physician with any problems or Same Day Surgery at [phone], Monday through Friday 6 am to 4 pm, or [place] [facility] number at [phone]. POLAR CARE INFORMATION  [email or web address]  How to use [place] Cold Therapy System?  YouTube   [email or web address]  OPERATING INSTRUCTIONS  Start the product With dry hands, connect the transformer to the electrical connection located on the top of the cooler. Next, plug the transformer into an appropriate electrical outlet. The unit will automatically start running at this point.  To stop the pump, disconnect electrical power.  Unplug to stop the product when not in use. Unplugging the Polar Care unit turns it off. Always unplug immediately after use. Never leave it plugged in while unattended. Remove pad.    FIRST ADD WATER TO FILL LINE, THEN ICE---Replace ice when existing ice is almost melted  1 Discuss Treatment with your [place] Practitioner and Use Only as Prescribed 2 Apply Insulation Barrier & Cold Therapy Pad 3 Check for Moisture 4 Inspect Skin Regularly  Tips and Trouble Shooting Usage Tips 1. Use cubed or chunked ice for optimal  performance. 2. It is recommended to drain the Pad between uses. To drain the pad, hold the Pad upright with the hose pointed toward the ground. Depress the black plunger and allow water to drain out. 3. You may disconnect the Pad from the unit without removing the pad from the affected area by depressing the silver tabs on the hose coupling and gently pulling the hoses apart. The Pad and unit will seal itself and will not leak. Note: Some dripping during release is normal. 4. DO NOT RUN PUMP WITHOUT WATER! The pump in this unit is designed to run with water. Running the unit without water will cause permanent damage to the pump. 5. Unplug unit before removing lid.  TROUBLESHOOTING GUIDE Pump not running, Water not flowing to the pad, Pad is not getting cold 1. Make sure the transformer is plugged into the wall outlet. 2. Confirm that the ice and water are filled to the indicated levels. 3. Make sure there are no kinks in the pad. 4. Gently pull on the blue tube to make sure the tube/pad junction is straight. 5. Remove the pad from the treatment site and ll it while the pad is lying at; then reapply. 6. Confirm that the pad couplings are securely attached to the unit. Listen for the double clicks (Figure 1) to confirm the pad couplings are securely attached.  Leaks    Note: Some condensation on the lines, controller, and pads is unavoidable, especially in warmer climates. 1. If using a [facility] Polar Care Cold Therapy unit with a detachable Cold Therapy  Pad, and a leak exists (other than condensation on the lines) disconnect the pad couplings. Make sure the silver tabs on the couplings are depressed before reconnecting the pad to the pump hose; then confirm both sides of the coupling are properly clicked in. 2. If the coupling continues to leak or a leak is detected in the pad itself, stop using it and call [place] at [phone].  Cleaning After use, empty and dry the unit with a soft  cloth. Warm water and mild detergent may be used occasionally to clean the pump and tubes.  WARNING: The [place] can be cold enough to cause serious injury, including full skin necrosis. Follow these Operating Instructions, and carefully read the Product Insert (see pouch on side of unit) and the Cold Therapy Pad Fitting Instructions (provided with each Cold Therapy Pad) prior to use.        SHOULDER SLING IMMOBILIZER   VIDEO Slingshot 2 Shoulder Brace Application - YouTube ---[email or web address]  INSTRUCTIONS While supporting the injured arm, slide the forearm into the sling. Wrap the adjustable shoulder strap around the neck and shoulders and attach the strap end to the sling using  the "alligator strap tab."  Adjust the shoulder strap to the required length. Position the shoulder pad behind the neck. To secure the shoulder pad location (optional), pull the shoulder strap away from the shoulder pad, unfold the hook material on the top of the pad, then press the shoulder strap back onto the hook material to secure the pad in place. Attach the closure strap across the open top of the sling. Position the strap so that it holds the arm securely in the sling. Next, attach the thumb strap to the open end of the sling between the thumb and fingers. After sling has been fit, it may be easily removed and reapplied using the quick release buckle on shoulder strap. If a neutral pillow or 15 abduction pillow is included, place the pillow at the waistline. Attach the sling to the pillow, lining up hook material on the pillow with the loop on sling. Adjust the waist strap to fit.  If waist strap is too long, cut it to fit. Use the small piece of double sided hook material (located on top of the pillow) to secure the strap end. Place the double sided hook material on the inside of the cut strap end and secure it to the waist strap.     If no pillow is included, attach the  waist strap to the sling and adjust to fit.    Washing Instructions: Straps and sling must be removed and cleaned regularly depending on your activity level and perspiration. Hand wash straps and sling in cold water with mild detergent, rinse, air dry

## 2022-08-07 ENCOUNTER — Encounter: Payer: Self-pay | Admitting: Orthopedic Surgery

## 2022-08-07 NOTE — Anesthesia Postprocedure Evaluation (Signed)
Anesthesia Post Note  Patient: Shawn Atkins  Procedure(s) Performed: SHOULDER ARTHROSCOPY WITH OPEN ROTATOR CUFF REPAIR AND DISTAL CLAVICLE ACROMINECTOMY (Right) BICEPS TENODESIS (Right)  Patient location during evaluation: PACU Anesthesia Type: General Level of consciousness: awake and alert Pain management: pain level controlled Vital Signs Assessment: post-procedure vital signs reviewed and stable Respiratory status: spontaneous breathing, nonlabored ventilation, respiratory function stable and patient connected to nasal cannula oxygen Cardiovascular status: blood pressure returned to baseline and stable Postop Assessment: no apparent nausea or vomiting Anesthetic complications: no   No notable events documented.   Last Vitals:  Vitals:   08/06/22 1756 08/06/22 1813  BP: (!) 150/89 134/66  Pulse: 100 91  Resp: 17 18  Temp: (!) 36.3 C 36.7 C  SpO2: 90% 92%    Last Pain:  Vitals:   08/06/22 1813  TempSrc: Temporal  PainSc: 6                  Martha Clan

## 2022-08-09 DIAGNOSIS — G8918 Other acute postprocedural pain: Secondary | ICD-10-CM | POA: Insufficient documentation

## 2022-08-17 ENCOUNTER — Ambulatory Visit: Payer: Medicare Other | Attending: Orthopedic Surgery | Admitting: Physical Therapy

## 2022-08-17 ENCOUNTER — Encounter: Payer: Self-pay | Admitting: Physical Therapy

## 2022-08-17 DIAGNOSIS — M25611 Stiffness of right shoulder, not elsewhere classified: Secondary | ICD-10-CM | POA: Insufficient documentation

## 2022-08-17 DIAGNOSIS — M6281 Muscle weakness (generalized): Secondary | ICD-10-CM | POA: Diagnosis present

## 2022-08-17 DIAGNOSIS — M25511 Pain in right shoulder: Secondary | ICD-10-CM | POA: Diagnosis not present

## 2022-08-17 NOTE — Therapy (Signed)
OUTPATIENT PHYSICAL THERAPY SHOULDER EVALUATION   Patient Name: Shawn Atkins MRN: [id number] DOB:10/16/1956, 66 y.o., male Today's Date: 08/17/2022   PT End of Session - 08/17/22 1553     Visit Number 1    Number of Visits 30    Date for PT Re-Evaluation 12/07/22    Authorization Type UHC Medicare, VL based on medical necessity    PT Start Time 1502    PT Stop Time 1549    PT Time Calculation (min) 47 min    Equipment Utilized During Treatment --   BREG sling for R shoulder   Activity Tolerance Patient limited by pain;Patient tolerated treatment well    Behavior During Therapy WFL for tasks assessed/performed             Past Medical History:  Diagnosis Date   BPH (benign prostatic hypertrophy)    Bronchitis    Chronic prostatitis    Complication of anesthesia    Felt like "couldn't breathe" after triple Hernia surgery   Diabetes mellitus without complication (HCC)    Flank pain    GERD (gastroesophageal reflux disease)    h/o   Gross hematuria    History of kidney stones    HTN (hypertension)    pt states he takes lisinopril for kidney protection due to dm not htn   Inguinal hernia    Nocturia    OSA on CPAP    with 02 2 L   Overweight    PONV (postoperative nausea and vomiting)    only during kidney stone surgery   Spermatocele    Stricture of urethra    Vertigo    1 episode, 6-7 yrs ago   Past Surgical History:  Procedure Laterality Date   BICEPT TENODESIS Right 08/06/2022   Procedure: BICEPS TENODESIS;  Surgeon: [name], MD;  Location: [facility];  Service: Orthopedics;  Laterality: Right;   CATARACT EXTRACTION W/PHACO Left 07/22/2020   Procedure: CATARACT EXTRACTION PHACO AND INTRAOCULAR LENS PLACEMENT (IOC) LEFT DIABETIC 4.14  00:33.0;  Surgeon: Eulogio Bear, MD;  Location: [facility];  Service: Ophthalmology;  Laterality: Left;  Diabetic - oral meds   CATARACT EXTRACTION W/PHACO Right 08/12/2020   Procedure: CATARACT  EXTRACTION PHACO AND INTRAOCULAR LENS PLACEMENT ([place]) RIGHT DIABETIC;  Surgeon: Eulogio Bear, MD;  Location: [facility];  Service: Ophthalmology;  Laterality: Right;  1.99 0:26.2   COLONOSCOPY WITH PROPOFOL N/A 11/20/2015   Procedure: COLONOSCOPY WITH PROPOFOL;  Surgeon: Christene Lye, MD;  Location: [facility];  Service: Endoscopy;  Laterality: N/A;   HERNIA REPAIR Bilateral 2025   umbilical and bil inguinal/ Dr Marina Gravel   KIDNEY STONE SURGERY     KNEE ARTHROSCOPY Left    open lithotomy     SHOULDER ARTHROSCOPY WITH OPEN ROTATOR CUFF REPAIR AND DISTAL CLAVICLE ACROMINECTOMY Right 08/06/2022   Procedure: SHOULDER ARTHROSCOPY WITH OPEN ROTATOR CUFF REPAIR AND DISTAL CLAVICLE ACROMINECTOMY;  Surgeon: [name], MD;  Location: [facility];  Service: Orthopedics;  Laterality: Right;   Patient Active Problem List   Diagnosis Date Noted   History of rectal bleeding 06/01/2018   Left groin pain 03/18/2018   Sacroiliac joint pain 01/13/2017   Abdominal pain, left lower quadrant 08/23/2015   BPH with obstruction/lower urinary tract symptoms 08/23/2015   Biceps tendinitis 06/25/2015    PCP: Lynnell Jude, MD  REFERRING PROVIDER: [name], MD  REFERRING DIAGNOSIS: M75.101 Unspecified rotator cuff tear or rupture of right shoulder, not specified as traumatic  THERAPY DIAG: Acute pain of right shoulder  Stiffness of right shoulder, not elsewhere classified  Muscle weakness (generalized)  RATIONALE FOR EVALUATION AND TREATMENT: Rehabilitation  ONSET DATE: 08/06/22 R RCR and distal clavicle acromionectomy   FOLLOW UP APPT WITH PROVIDER: Yes    SUBJECTIVE:                                                                                                                                                                                         Chief Complaint: Pt is a 66 year old male s/p R shoulder rotator cuff repair and distal clavicle  acromionectomy  Pertinent History Pt is a 66 year old male s/p R shoulder rotator cuff repair and distal clavicle acromionectomy. Patient reports that his surgeon informed him he did have full tear of rotator cuff in surgery. Patient reports 4 days of severe pain just out of surgery. Patient reports managing pain well with Tylenol/Ibuprofen well now. Pt followed up with [facility] for erythema and increased temperature as well as notable edema; ruled out infection and DVT. Patient reports comorbid neck and R upper trap pain. Pt reports difficulty with sleep positioning due to comorbid neck pain.   Pain:  Pain Intensity: Present: 2-3/10, Best: 2/10, Worst: 5-6/10 Pain location: R shoulder along deltoid and biceps Pain Quality: steady pain, intense intermittently; sharp pain along ACJ that comes and goes Radiating: Yes , to anterior arm Numbness/Tingling: No Aggravating factors: moving his R arm Relieving factors: Ibuprofen, Tylenol, resting arm/arm propped on armrest  History of prior shoulder or neck/shoulder injury, pain, surgery, or therapy: Yes, L shoulder with hx of cortisone injections, Hx of neck injury/PT for his neck Falls: Has patient fallen in last 6 months? No Dominant hand: left Imaging: Yes , MRI pre-operatively Prior level of function: Independent Occupational demands: Pt works part-time with [employer] (20 hrs/week), pt out of work now on medical leave Hobbies: [employer] (personal history of cancer, chills/fever, night sweats, nausea, vomiting, unrelenting pain): Negative  Precautions: None  Weight Bearing Restrictions: No  Living Environment Lives with: lives with their spouse Lives in: House/apartment   Patient Goals: Able to pick up weight with his arm, fish; able to play with baseball/bat with his grandson    OBJECTIVE:   Patient Surveys  FOTO: 4, predicted outcome score of 56   Cognition Patient is oriented to person, place, and time.  Recent memory is  intact.  Remote memory is intact.  Attention span and concentration are intact.  Expressive speech is intact.  Patient's fund of knowledge is within normal limits for educational level.    Gross Musculoskeletal Assessment Tremor: None Bulk: Atrophy of deltoid/posterior cuff  mm Tone: Normal  Observation No sign of acute infection, mild erythema along R upper arm, mild increased tissue temperature along R middle deltoid     Posture FHRS posture   AROM  AROM (Normal range in degrees) AROM 08/17/2022   Right Left  Shoulder    Flexion Deferred   Extension Deferred   Abduction Deferred   External Rotation Deferred   Internal Rotation Deferred   Hands Behind Head Deferred   Hands Behind Back Deferred       Elbow    Flexion WNL   Extension WNL   Pronation 50   Supination 60   (* = pain; Blank rows = not tested)   PROM PROM (Normal range in degrees) AROM 08/17/2022   Right Left  Shoulder    Flexion 40   Extension    Abduction    External Rotation (arm at side) To neutral/0 deg   Internal Rotation (arm at side) 45      LE MMT: Manual muscle tests deferred to later date, post-op restrictions MMT (out of 5) Right 08/17/2022 Left 08/17/2022      Shoulder   Flexion    Extension    Abduction    External rotation    Internal rotation    Horizontal abduction    Horizontal adduction    Lower Trapezius    Rhomboids        Elbow  Flexion    Extension    Pronation    Supination        Wrist  Flexion    Extension    Radial deviation    Ulnar deviation    (* = pain; Blank rows = not tested)   Sensation Deferred  Reflexes Deferred   Palpation  Location LEFT  RIGHT           Subocciptials    Cervical paraspinals    Upper Trapezius    Levator Scapulae    Rhomboid Major/Minor    Sternoclavicular joint    Acromioclavicular joint  2  Coracoid process  1  Long head of biceps  1  Supraspinatus  2  Infraspinatus  1  Subscapularis    Teres  Minor    Teres Major    Pectoralis Major    Pectoralis Minor    Anterior Deltoid  1  Lateral Deltoid  2  Posterior Deltoid    Latissimus Dorsi    Sternocleidomastoid    (Blank rows = not tested) Graded on 0-4 scale (0 = no pain, 1 = pain, 2 = pain with wincing/grimacing/flinching, 3 = pain with withdrawal, 4 = unwilling to allow palpation), (Blank rows = not tested)    TODAY'S TREATMENT    Manual Therapy - for R shoulder ROM and to prevent R shoulder stiffness  R shoulder PROM into flexion and ER to neutral, ROM within pt tolerance, gentle oscillations of upper limb during ROM; x 8 minutes    Therapeutic Exercise - for HEP establishment, discussion on appropriate exercise/activity modification, PT education   Reviewed baseline home exercises and provided handout for [place] program (see Access Code); verbal cueing and therapist demonstration utilized as needed for carryover of proper technique to HEP.    Patient education on current condition, role of PT, prognosis, plan of care. Discussion on post-op restrictions for s/p massive RCR.     PATIENT EDUCATION:  Education details: Plan of care Person educated: Patient Education method: Explanation Education comprehension: verbalized understanding   HOME EXERCISE PROGRAM: Access Code: [email or web address]  URL: [email or web address] Date: 08/17/2022 Prepared by: Valentina Gu  Exercises - Flexion-Extension Shoulder Pendulum with Table Support  - 2 x daily - 7 x weekly - 3 sets - 10 reps - Seated Scapular Retraction  - 2 x daily - 7 x weekly - 2 sets - 10 reps - 3sec hold   ASSESSMENT:  CLINICAL IMPRESSION: Patient is a 66 y.o. male s/p right shoulder arthroscopy with open rotator cuff repair and distal clavicle acrominectomy/subacromial decompression. Patient has typical RUE impairments in R shoulder AROM and PROM, post-operative pain, edema, decreased strength. These impairments are limiting patient from meal  prep, cleaning, laundry, driving, and community activity. Personal factors including Age, Past/current experiences, Time since onset of injury/illness/exacerbation, and 3+ comorbidities: (BPH, DM, HTN)  are also affecting patient's functional outcome. Patient will benefit from skilled PT to address above impairments and improve overall function.  REHAB POTENTIAL: Good  CLINICAL DECISION MAKING: Stable/uncomplicated  EVALUATION COMPLEXITY: Low   GOALS: Goals reviewed with patient? Yes  SHORT TERM GOALS: Target date: 09/14/2022  Pt will be independent with HEP to minimize adverse effects of immobilization and gently mobilize R shoulder to improve pain-free function at home and work. Baseline: 08/17/22: Baseline HEP initiated Goal status: INITIAL   LONG TERM GOALS: Target date: 12/07/22  Pt will increase FOTO to at least 56 to demonstrate significant improvement in function at home and work related to neck pain  Baseline: 08/17/22: 4 Goal status: INITIAL  2.  Pt will decrease worst shoulder pain by at least 3 points on the NPRS in order to demonstrate clinically significant reduction in shoulder pain. Baseline: 08/17/22: 5-6/10 at worst over last week Goal status: INITIAL  3.   Pt will have R shoulder AROM at least within 10 degrees of contralateral upper extremity indicative of improved ROM as needed for reaching, overhead work, self-care activities/dressing/grooming Baseline: 08/17/22: NO AROM presently, severely limited PROM in early post-op phase.    Goal status: INITIAL  4.   Pt will have R shoulder MMTs at 4+/5 or greater for all directions performed indicative of improved strength as needed for completion of daily lifting, reaching, carrying tasks (e.g. 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   Pt will perform unilateral farmer's carry with 15 lbs for 100 ft without  reproduction of pain simulating carrying items in [employer] including carrying trash bag  Baseline: 08/17/22: Unable to perform lifting/carrying Goal status: INITIAL   PLAN: PT FREQUENCY: 1-2x/week  PT DURATION: 12 weeks  PLANNED INTERVENTIONS: Therapeutic exercises, Therapeutic activity, Neuromuscular re-education, Balance training, Gait training, Patient/Family education,  Joint mobilization, Electrical stimulation, Cryotherapy, Moist heat, and Manual therapy  PLAN FOR NEXT SESSION: R shoulder PROM up to 140 deg only, ER to neutral. Elbow and wrist/forearm AROM. Gripping as tolerated. Emphasis on protection of RTC repair in early-phase PT. Cryotherapy and modalities for pain prn.     Valentina Gu, PT, DPT #[id number]  Eilleen Kempf 08/17/2022, 3:55 PM

## 2022-08-19 ENCOUNTER — Ambulatory Visit: Payer: Medicare Other | Admitting: Physical Therapy

## 2022-08-19 ENCOUNTER — Encounter: Payer: Self-pay | Admitting: Physical Therapy

## 2022-08-19 ENCOUNTER — Other Ambulatory Visit: Payer: Self-pay | Admitting: Urology

## 2022-08-19 DIAGNOSIS — M6281 Muscle weakness (generalized): Secondary | ICD-10-CM

## 2022-08-19 DIAGNOSIS — M25511 Pain in right shoulder: Secondary | ICD-10-CM

## 2022-08-19 DIAGNOSIS — M25611 Stiffness of right shoulder, not elsewhere classified: Secondary | ICD-10-CM

## 2022-08-19 MED ORDER — "BD DISP NEEDLES 21G X 1-1/2"" MISC": 1.0000 mg | 0 refills | Status: DC

## 2022-08-19 MED ORDER — "BD DISP NEEDLES 18G X 1-1/2"" MISC": 1.0000 mg | 0 refills | Status: DC

## 2022-08-19 NOTE — Telephone Encounter (Signed)
Pt wife called stating the [facility] does not have the right size needles for his injections and the draw.  Please call the needles, both sizes, into [facility], [place].

## 2022-08-19 NOTE — Therapy (Unsigned)
OUTPATIENT PHYSICAL THERAPY TREATMENT NOTE   Patient Name: Shawn Atkins MRN: [id number] DOB:[birth year], 66 y.o., male Today's Date: 08/19/2022  PCP: Lynnell Jude, MD REFERRING PROVIDER: Lynnell Jude, MD  END OF SESSION:   PT End of Session - 08/19/22 1755     Visit Number 2    Number of Visits 30    Date for PT Re-Evaluation 12/07/22    Authorization Type [employer] Medicare, VL based on medical necessity    PT Start Time 1754    PT Stop Time 1841    PT Time Calculation (min) 47 min    Equipment Utilized During Treatment --   BREG sling for R shoulder   Activity Tolerance Patient limited by pain;Patient tolerated treatment well    Behavior During Therapy WFL for tasks assessed/performed             Past Medical History:  Diagnosis Date   BPH (benign prostatic hypertrophy)    Bronchitis    Chronic prostatitis    Complication of anesthesia    Felt like "couldn't breathe" after triple Hernia surgery   Diabetes mellitus without complication (HCC)    Flank pain    GERD (gastroesophageal reflux disease)    h/o   Gross hematuria    History of kidney stones    HTN (hypertension)    pt states he takes lisinopril for kidney protection due to dm not htn   Inguinal hernia    Nocturia    OSA on CPAP    with 02 2 L   Overweight    PONV (postoperative nausea and vomiting)    only during kidney stone surgery   Spermatocele    Stricture of urethra    Vertigo    1 episode, 6-7 yrs ago   Past Surgical History:  Procedure Laterality Date   BICEPT TENODESIS Right 08/06/2022   Procedure: BICEPS TENODESIS;  Surgeon: [name], MD;  Location: [facility];  Service: Orthopedics;  Laterality: Right;   CATARACT EXTRACTION W/PHACO Left 07/22/2020   Procedure: CATARACT EXTRACTION PHACO AND INTRAOCULAR LENS PLACEMENT (I[facility]) LEFT DIABETIC 4.14  00:33.0;  Surgeon: Eulogio Bear, MD;  Location: [place];  Service: Ophthalmology;  Laterality: Left;  Diabetic - oral meds    CATARACT EXTRACTION W/PHACO Right 08/12/2020   Procedure: CATARACT EXTRACTION PHACO AND INTRAOCULAR LENS PLACEMENT ([place]) RIGHT DIABETIC;  Surgeon: Eulogio Bear, MD;  Location: [place];  Service: Ophthalmology;  Laterality: Right;  1.99 0:26.2   COLONOSCOPY WITH PROPOFOL N/A 11/20/2015   Procedure: COLONOSCOPY WITH PROPOFOL;  Surgeon: Christene Lye, MD;  Location: [facility];  Service: Endoscopy;  Laterality: N/A;   HERNIA REPAIR Bilateral 8182   umbilical and bil inguinal/ Dr Marina Gravel   KIDNEY STONE SURGERY     KNEE ARTHROSCOPY Left    open lithotomy     SHOULDER ARTHROSCOPY WITH OPEN ROTATOR CUFF REPAIR AND DISTAL CLAVICLE ACROMINECTOMY Right 08/06/2022   Procedure: SHOULDER ARTHROSCOPY WITH OPEN ROTATOR CUFF REPAIR AND DISTAL CLAVICLE ACROMINECTOMY;  Surgeon: [name], MD;  Location: [facility];  Service: Orthopedics;  Laterality: Right;   Patient Active Problem List   Diagnosis Date Noted   History of rectal bleeding 06/01/2018   Left groin pain 03/18/2018   Sacroiliac joint pain 01/13/2017   Abdominal pain, left lower quadrant 08/23/2015   BPH with obstruction/lower urinary tract symptoms 08/23/2015   Biceps tendinitis 06/25/2015    REFERRING DIAG: M75.101 Unspecified rotator cuff tear or rupture of right shoulder, not  specified as traumatic  THERAPY DIAG:  Acute pain of right shoulder  Stiffness of right shoulder, not elsewhere classified  Muscle weakness (generalized)  Rationale for Evaluation and Treatment Rehabilitation   PERTINENT HISTORY: Pt is a 66 year old male s/p R shoulder rotator cuff repair and distal clavicle acromionectomy with Dr. Mack Guise on 08/06/22. Patient reports that his surgeon informed him he did have full tear of rotator cuff in surgery. Patient reports 4 days of severe pain just out of surgery. Patient reports managing pain well with Tylenol/Ibuprofen well now. Pt followed up with [facility] for erythema and increased  temperature as well as notable edema; ruled out infection and DVT. Patient reports comorbid neck and R upper trap pain. Pt reports difficulty with sleep positioning due to comorbid neck pain.    Pain:  Pain Intensity: Present: 2-3/10, Best: 2/10, Worst: 5-6/10 Pain location: R shoulder along deltoid and biceps Pain Quality: steady pain, intense intermittently; sharp pain along ACJ that comes and goes Radiating: Yes , to anterior arm Numbness/Tingling: No Aggravating factors: moving his R arm Relieving factors: Ibuprofen, Tylenol, resting arm/arm propped on armrest  History of prior shoulder or neck/shoulder injury, pain, surgery, or therapy: Yes, L shoulder with hx of cortisone injections, Hx of neck injury/PT for his neck Falls: Has patient fallen in last 6 months? No Dominant hand: left Imaging: Yes , MRI pre-operatively Prior level of function: Independent Occupational demands: Pt works part-time with [employer] (20 hrs/week), pt out of work now on medical leave Hobbies: [employer] (personal history of cancer, chills/fever, night sweats, nausea, vomiting, unrelenting pain): Negative   Precautions: None   Weight Bearing Restrictions: No   Living Environment Lives with: lives with their spouse Lives in: House/apartment     Patient Goals: Able to pick up weight with his arm, fish; able to play with baseball/bat with his grandson    PRECAUTIONS: No R shoulder AROM, no RUE lifting   DOS: 08/06/22, s/p R large RTC repair, R distal acromionectomy   SUBJECTIVE:                                                                                                                                                                                      SUBJECTIVE STATEMENT:  Patient reports compliance with HEP and sling use. He reports he is managing pain with OTC medications at this time. He reports obtaining adequate pillow for sleeping on recliner at this time due to not tolerating much time  lying in bed.   PAIN:  Are you having pain? Yes: NPRS scale: 3-4/10 Pain location: proximal arm/deltoid region     OBJECTIVE: (objective measures completed at initial evaluation unless  otherwise dated)     Patient Surveys  FOTO: 4, predicted outcome score of 10     Cognition Patient is oriented to person, place, and time.  Recent memory is intact.  Remote memory is intact.  Attention span and concentration are intact.  Expressive speech is intact.  Patient's fund of knowledge is within normal limits for educational level.                          Gross Musculoskeletal Assessment Tremor: None Bulk: Atrophy of deltoid/posterior cuff mm Tone: Normal   Observation No sign of acute infection, mild erythema along R upper arm, mild increased tissue temperature along R middle deltoid         Posture FHRS posture     AROM       AROM (Normal range in degrees) AROM 08/17/2022    Right Left  Shoulder      Flexion Deferred    Extension Deferred    Abduction Deferred    External Rotation Deferred    Internal Rotation Deferred    Hands Behind Head Deferred    Hands Behind Back Deferred           Elbow      Flexion WNL    Extension WNL    Pronation 50    Supination 60    (* = pain; Blank rows = not tested)     PROM     PROM (Normal range in degrees) AROM 08/17/2022    Right Left  Shoulder      Flexion 40    Extension      Abduction      External Rotation (arm at side) To neutral/0 deg    Internal Rotation (arm at side) 45          LE MMT: Manual muscle tests deferred to later date, post-op restrictions MMT (out of 5) Right 08/17/2022 Left 08/17/2022         Shoulder   Flexion      Extension      Abduction      External rotation      Internal rotation      Horizontal abduction      Horizontal adduction      Lower Trapezius      Rhomboids             Elbow  Flexion      Extension      Pronation      Supination             Wrist   Flexion      Extension      Radial deviation      Ulnar deviation      (* = pain; Blank rows = not tested)     Sensation Deferred   Reflexes Deferred     Palpation   Location LEFT  RIGHT           Subocciptials      Cervical paraspinals      Upper Trapezius      Levator Scapulae      Rhomboid Major/Minor      Sternoclavicular joint      Acromioclavicular joint   2  Coracoid process   1  Long head of biceps   1  Supraspinatus   2  Infraspinatus   1  Subscapularis      Teres Minor      Teres Major  Pectoralis Major      Pectoralis Minor      Anterior Deltoid   1  Lateral Deltoid   2  Posterior Deltoid      Latissimus Dorsi      Sternocleidomastoid      (Blank rows = not tested) Graded on 0-4 scale (0 = no pain, 1 = pain, 2 = pain with wincing/grimacing/flinching, 3 = pain with withdrawal, 4 = unwilling to allow palpation), (Blank rows = not tested)       TODAY'S TREATMENT      Manual Therapy - for R shoulder ROM and to prevent R shoulder stiffness   R shoulder PROM into flexion and ER to neutral, ROM within pt tolerance, gentle oscillations of upper limb during ROM; x 8 minutes       Therapeutic Exercise - for HEP establishment, discussion on appropriate exercise/activity modification, PT education     Scapular retractions, standing; 20x, 3 sec  Wrist AROM; flexion/extension, RD/UD, pronation/supination; reviewed for HEP       PATIENT EDUCATION:  Education details: Plan of care Person educated: Patient Education method: Explanation Education comprehension: verbalized understanding     HOME EXERCISE PROGRAM: Access Code: [email or web address] URL: [email or web address] Date: 08/17/2022 Prepared by: Valentina Gu   Exercises - Flexion-Extension Shoulder Pendulum with Table Support  - 2 x daily - 7 x weekly - 3 sets - 10 reps - Seated Scapular Retraction  - 2 x daily - 7 x weekly - 2 sets - 10 reps - 3sec hold     ASSESSMENT:    CLINICAL IMPRESSION: Patient is a 66 y.o. male s/p right shoulder arthroscopy with open rotator cuff repair and distal clavicle acrominectomy/subacromial decompression. Patient has typical RUE impairments in R shoulder AROM and PROM, post-operative pain, edema, decreased strength. These impairments are limiting patient from meal prep, cleaning, laundry, driving, and community activity. Personal factors including Age, Past/current experiences, Time since onset of injury/illness/exacerbation, and 3+ comorbidities: (BPH, DM, HTN)  are also affecting patient's functional outcome. Patient will benefit from skilled PT to address above impairments and improve overall function.   REHAB POTENTIAL: Good   CLINICAL DECISION MAKING: Stable/uncomplicated   EVALUATION COMPLEXITY: Low     GOALS: Goals reviewed with patient? Yes   SHORT TERM GOALS: Target date: 09/14/2022   Pt will be independent with HEP to minimize adverse effects of immobilization and gently mobilize R shoulder to improve pain-free function at home and work. Baseline: 08/17/22: Baseline HEP initiated Goal status: INITIAL     LONG TERM GOALS: Target date: 12/07/22   Pt will increase FOTO to at least 56 to demonstrate significant improvement in function at home and work related to neck pain  Baseline: 08/17/22: 4 Goal status: INITIAL   2.  Pt will decrease worst shoulder pain by at least 3 points on the NPRS in order to demonstrate clinically significant reduction in shoulder pain. Baseline: 08/17/22: 5-6/10 at worst over last week Goal status: INITIAL   3.   Pt will have R shoulder AROM at least within 10 degrees of contralateral upper extremity indicative of improved ROM as needed for reaching, overhead work, self-care activities/dressing/grooming Baseline: 08/17/22: NO AROM presently, severely limited PROM in early post-op phase.    Goal status: INITIAL   4.   Pt will have R shoulder MMTs at 4+/5 or greater for all directions  performed indicative of improved strength as needed for completion of daily lifting, reaching, carrying tasks (e.g. taking out trash during his part-time job  at [employer]) Baseline: 08/17/22: Poor strength, MMTs deferred at initial eval  Goal status: INITIAL   5.   Pt will perform unilateral farmer's carry with 15 lbs for 100 ft without reproduction of pain simulating carrying items in [employer] including carrying trash bag  Baseline: 08/17/22: Unable to perform lifting/carrying Goal status: INITIAL     PLAN: PT FREQUENCY: 1-2x/week   PT DURATION: 12 weeks   PLANNED INTERVENTIONS: Therapeutic exercises, Therapeutic activity, Neuromuscular re-education, Balance training, Gait training, Patient/Family education,  Joint mobilization, Electrical stimulation, Cryotherapy, Moist heat, and Manual therapy   PLAN FOR NEXT SESSION: R shoulder PROM up to 140 deg only, ER to neutral. Elbow and wrist/forearm AROM. Gripping as tolerated. Emphasis on protection of RTC repair in early-phase PT. Cryotherapy and modalities for pain prn.          Eilleen Kempf, PT 08/19/2022, 6:40 PM

## 2022-08-26 ENCOUNTER — Ambulatory Visit: Payer: Medicare Other | Attending: Orthopedic Surgery | Admitting: Physical Therapy

## 2022-08-26 DIAGNOSIS — M6281 Muscle weakness (generalized): Secondary | ICD-10-CM | POA: Insufficient documentation

## 2022-08-26 DIAGNOSIS — M25511 Pain in right shoulder: Secondary | ICD-10-CM | POA: Diagnosis not present

## 2022-08-26 DIAGNOSIS — M25611 Stiffness of right shoulder, not elsewhere classified: Secondary | ICD-10-CM | POA: Insufficient documentation

## 2022-08-26 NOTE — Therapy (Signed)
OUTPATIENT PHYSICAL THERAPY TREATMENT NOTE   Patient Name: Shawn Atkins MRN: [id number] DOB:[birth year], 66 y.o., male Today's Date: 08/28/2022  PCP: Lynnell Jude, MD REFERRING PROVIDER: Lynnell Jude, MD  END OF SESSION:   PT End of Session - 08/28/22 2143     Visit Number 3    Number of Visits 30    Date for PT Re-Evaluation 12/07/22    Authorization Type [employer] Medicare, VL based on medical necessity    PT Start Time 1331    PT Stop Time 1415    PT Time Calculation (min) 44 min    Equipment Utilized During Treatment --   BREG sling for R shoulder   Activity Tolerance Patient limited by pain;Patient tolerated treatment well    Behavior During Therapy WFL for tasks assessed/performed              Past Medical History:  Diagnosis Date   BPH (benign prostatic hypertrophy)    Bronchitis    Chronic prostatitis    Complication of anesthesia    Felt like "couldn't breathe" after triple Hernia surgery   Diabetes mellitus without complication (HCC)    Flank pain    GERD (gastroesophageal reflux disease)    h/o   Gross hematuria    History of kidney stones    HTN (hypertension)    pt states he takes lisinopril for kidney protection due to dm not htn   Inguinal hernia    Nocturia    OSA on CPAP    with 02 2 L   Overweight    PONV (postoperative nausea and vomiting)    only during kidney stone surgery   Spermatocele    Stricture of urethra    Vertigo    1 episode, 6-7 yrs ago   Past Surgical History:  Procedure Laterality Date   BICEPT TENODESIS Right 08/06/2022   Procedure: BICEPS TENODESIS;  Surgeon: [name], MD;  Location: [facility];  Service: Orthopedics;  Laterality: Right;   CATARACT EXTRACTION W/PHACO Left 07/22/2020   Procedure: CATARACT EXTRACTION PHACO AND INTRAOCULAR LENS PLACEMENT ([facility]) LEFT DIABETIC 4.14  00:33.0;  Surgeon: Eulogio Bear, MD;  Location: [place];  Service: Ophthalmology;  Laterality: Left;  Diabetic - oral meds    CATARACT EXTRACTION W/PHACO Right 08/12/2020   Procedure: CATARACT EXTRACTION PHACO AND INTRAOCULAR LENS PLACEMENT ([employer]) RIGHT DIABETIC;  Surgeon: Eulogio Bear, MD;  Location: [place];  Service: Ophthalmology;  Laterality: Right;  1.99 0:26.2   COLONOSCOPY WITH PROPOFOL N/A 11/20/2015   Procedure: COLONOSCOPY WITH PROPOFOL;  Surgeon: Christene Lye, MD;  Location: [facility];  Service: Endoscopy;  Laterality: N/A;   HERNIA REPAIR Bilateral 2878   umbilical and bil inguinal/ Dr Marina Gravel   KIDNEY STONE SURGERY     KNEE ARTHROSCOPY Left    open lithotomy     SHOULDER ARTHROSCOPY WITH OPEN ROTATOR CUFF REPAIR AND DISTAL CLAVICLE ACROMINECTOMY Right 08/06/2022   Procedure: SHOULDER ARTHROSCOPY WITH OPEN ROTATOR CUFF REPAIR AND DISTAL CLAVICLE ACROMINECTOMY;  Surgeon: [name], MD;  Location: [facility];  Service: Orthopedics;  Laterality: Right;   Patient Active Problem List   Diagnosis Date Noted   History of rectal bleeding 06/01/2018   Left groin pain 03/18/2018   Sacroiliac joint pain 01/13/2017   Abdominal pain, left lower quadrant 08/23/2015   BPH with obstruction/lower urinary tract symptoms 08/23/2015   Biceps tendinitis 06/25/2015    REFERRING DIAG: M75.101 Unspecified rotator cuff tear or rupture of right shoulder,  not specified as traumatic  THERAPY DIAG:  Acute pain of right shoulder  Stiffness of right shoulder, not elsewhere classified  Muscle weakness (generalized)  Rationale for Evaluation and Treatment Rehabilitation   PERTINENT HISTORY: Pt is a 66 year old male s/p R shoulder rotator cuff repair and distal clavicle acromionectomy with Dr. Mack Guise on 08/06/22. Patient reports that his surgeon informed him he did have full tear of rotator cuff in surgery. Patient reports 4 days of severe pain just out of surgery. Patient reports managing pain well with Tylenol/Ibuprofen well now. Pt followed up with [facility] for erythema and  increased temperature as well as notable edema; ruled out infection and DVT. Patient reports comorbid neck and R upper trap pain. Pt reports difficulty with sleep positioning due to comorbid neck pain.    Pain:  Pain Intensity: Present: 2-3/10, Best: 2/10, Worst: 5-6/10 Pain location: R shoulder along deltoid and biceps Pain Quality: steady pain, intense intermittently; sharp pain along ACJ that comes and goes Radiating: Yes , to anterior arm Numbness/Tingling: No Aggravating factors: moving his R arm Relieving factors: Ibuprofen, Tylenol, resting arm/arm propped on armrest  History of prior shoulder or neck/shoulder injury, pain, surgery, or therapy: Yes, L shoulder with hx of cortisone injections, Hx of neck injury/PT for his neck Falls: Has patient fallen in last 6 months? No Dominant hand: left Imaging: Yes , MRI pre-operatively Prior level of function: Independent Occupational demands: Pt works part-time with [employer] (20 hrs/week), pt out of work now on medical leave Hobbies: [employer] (personal history of cancer, chills/fever, night sweats, nausea, vomiting, unrelenting pain): Negative   Precautions: None   Weight Bearing Restrictions: No   Living Environment Lives with: lives with their spouse Lives in: House/apartment     Patient Goals: Able to pick up weight with his arm, fish; able to play with baseball/bat with his grandson    PRECAUTIONS: No R shoulder AROM, no RUE lifting   DOS: 08/06/22, s/p R large RTC repair, R distal acromionectomy   SUBJECTIVE:                                                                                                                                                                                      SUBJECTIVE STATEMENT:  Patient reports compliance with HEP and sling use. Pt reports doing pretty well after his last visit. He reports having his wife help with his sling whenever she is home - she works during the day. Pt reports having  his sling off for full day one day this past week. Pt reports refraining from R shoulder AROM/reaching/lifting.    PAIN:  Are you having pain? Yes: NPRS scale:  4/10 Pain location: proximal arm/deltoid region     OBJECTIVE: (objective measures completed at initial evaluation unless otherwise dated)     Patient Surveys  FOTO: 4, predicted outcome score of 56     Cognition Patient is oriented to person, place, and time.  Recent memory is intact.  Remote memory is intact.  Attention span and concentration are intact.  Expressive speech is intact.  Patient's fund of knowledge is within normal limits for educational level.                          Gross Musculoskeletal Assessment Tremor: None Bulk: Atrophy of deltoid/posterior cuff mm Tone: Normal   Observation No sign of acute infection, mild erythema along R upper arm, mild increased tissue temperature along R middle deltoid         Posture FHRS posture     AROM       AROM (Normal range in degrees) AROM 08/17/2022    Right Left  Shoulder      Flexion Deferred    Extension Deferred    Abduction Deferred    External Rotation Deferred    Internal Rotation Deferred    Hands Behind Head Deferred    Hands Behind Back Deferred           Elbow      Flexion WNL    Extension WNL    Pronation 50    Supination 60    (* = pain; Blank rows = not tested)     PROM     PROM (Normal range in degrees) AROM 08/17/2022    Right Left  Shoulder      Flexion 40    Extension      Abduction      External Rotation (arm at side) To neutral/0 deg    Internal Rotation (arm at side) 45          LE MMT: Manual muscle tests deferred to later date, post-op restrictions MMT (out of 5) Right 08/17/2022 Left 08/17/2022         Shoulder   Flexion      Extension      Abduction      External rotation      Internal rotation      Horizontal abduction      Horizontal adduction      Lower Trapezius      Rhomboids              Elbow  Flexion      Extension      Pronation      Supination             Wrist  Flexion      Extension      Radial deviation      Ulnar deviation      (* = pain; Blank rows = not tested)     Sensation Deferred   Reflexes Deferred     Palpation   Location LEFT  RIGHT           Subocciptials      Cervical paraspinals      Upper Trapezius      Levator Scapulae      Rhomboid Major/Minor      Sternoclavicular joint      Acromioclavicular joint   2  Coracoid process   1  Long head of biceps   1  Supraspinatus   2  Infraspinatus  1  Subscapularis      Teres Minor      Teres Major      Pectoralis Major      Pectoralis Minor      Anterior Deltoid   1  Lateral Deltoid   2  Posterior Deltoid      Latissimus Dorsi      Sternocleidomastoid      (Blank rows = not tested) Graded on 0-4 scale (0 = no pain, 1 = pain, 2 = pain with wincing/grimacing/flinching, 3 = pain with withdrawal, 4 = unwilling to allow palpation), (Blank rows = not tested)       TODAY'S TREATMENT      Manual Therapy - for R shoulder ROM and to prevent R shoulder stiffness   R shoulder PROM into flexion and ER to neutral, ROM within pt tolerance, gentle oscillations of upper limb during ROM; x 20 minutes Gentle STM to R anterior and middle deltoid, biceps, upper trapezius x 5 minutes     Therapeutic Exercise - for HEP establishment, discussion on appropriate exercise/activity modification, PT education     Scapular retractions, standing; 10x, 3 sec - for review  Pendulums; forward/backward and circles CW/CCW; x20   Assisted patient with donning/doffing BREG sling with pt essentially dependent for managing straps.   PATIENT EDUCATION: Reviewed at length post-operative restrictions, home exercise program, maintaining sling use throughout the day and night with exception of dressing/hygiene and removing for short periods with help from his spouse      Cold pack (unbilled) - for  anti-inflammatory and analgesic effect as needed for reduced pain and improved ability to participate in active PT intervention, along R shoulder in sitting, pillow propped under R forearm for upper limb support, x 5 minutes   *not today* Gripping; pink Theraputty; composite fist, performed x 2 minutes Wrist AROM; flexion/extension, RD/UD, pronation/supination; reviewed for HEP    PATIENT EDUCATION:  Education details: Plan of care Person educated: Patient Education method: Explanation Education comprehension: verbalized understanding     HOME EXERCISE PROGRAM: Access Code: [email or web address] URL: [email or web address] Date: 08/17/2022 Prepared by: Valentina Gu   Exercises - Flexion-Extension Shoulder Pendulum with Table Support  - 2 x daily - 7 x weekly - 3 sets - 10 reps - Seated Scapular Retraction  - 2 x daily - 7 x weekly - 2 sets - 10 reps - 3sec hold     ASSESSMENT:   CLINICAL IMPRESSION: Patient does have intermittent pain with PROM > 45-50 deg. He is able to progress with forward elevation and ER motion tolerated this afternoon, but pt is still markedly limited in early post-operative phase of rehab. Pt demonstrates good technique with scapular retractions and has no significant pain behaviors with scapulothoracic AROM; he does have intermittent pain with active elbow flexion. Patient has ongoing post-operative impairments in R shoulder AROM and PROM, post-operative pain, edema, decreased strength. Patient will benefit from continued skilled PT to address above impairments and improve overall function.   REHAB POTENTIAL: Good   CLINICAL DECISION MAKING: Stable/uncomplicated   EVALUATION COMPLEXITY: Low     GOALS: Goals reviewed with patient? Yes   SHORT TERM GOALS: Target date: 09/14/2022   Pt will be independent with HEP to minimize adverse effects of immobilization and gently mobilize R shoulder to improve pain-free function at home and work. Baseline:  08/17/22: Baseline HEP initiated Goal status: INITIAL     LONG TERM GOALS: Target date: 12/07/22   Pt will increase FOTO to at least 56  to demonstrate significant improvement in function at home and work related to neck pain  Baseline: 08/17/22: 4 Goal status: INITIAL   2.  Pt will decrease worst shoulder pain by at least 3 points on the NPRS in order to demonstrate clinically significant reduction in shoulder pain. Baseline: 08/17/22: 5-6/10 at worst over last week Goal status: INITIAL   3.   Pt will have R shoulder AROM at least within 10 degrees of contralateral upper extremity indicative of improved ROM as needed for reaching, overhead work, self-care activities/dressing/grooming Baseline: 08/17/22: NO AROM presently, severely limited PROM in early post-op phase.    Goal status: INITIAL   4.   Pt will have R shoulder MMTs at 4+/5 or greater for all directions performed indicative of improved strength as needed for completion of daily lifting, reaching, carrying tasks (e.g. 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   Pt will perform unilateral farmer's carry with 15 lbs for 100 ft without reproduction of pain simulating carrying items in [employer] including carrying trash bag  Baseline: 08/17/22: Unable to perform lifting/carrying Goal status: INITIAL     PLAN: PT FREQUENCY: 1-2x/week   PT DURATION: 12 weeks   PLANNED INTERVENTIONS: Therapeutic exercises, Therapeutic activity, Neuromuscular re-education, Balance training, Gait training, Patient/Family education,  Joint mobilization, Electrical stimulation, Cryotherapy, Moist heat, and Manual therapy   PLAN FOR NEXT SESSION: R shoulder PROM up to 140 deg forward elevation only as tolerated, ER to neutral. Elbow and wrist/forearm AROM. Gripping as tolerated. Emphasis on protection of RTC repair in early-phase PT. Cryotherapy and  modalities for pain prn.       Valentina Gu, PT, DPT #[id number]  Eilleen Kempf, PT 08/28/2022, 9:44 PM

## 2022-08-28 ENCOUNTER — Encounter: Payer: Self-pay | Admitting: Physical Therapy

## 2022-08-31 ENCOUNTER — Ambulatory Visit: Payer: Medicare Other | Admitting: Physical Therapy

## 2022-08-31 DIAGNOSIS — M6281 Muscle weakness (generalized): Secondary | ICD-10-CM

## 2022-08-31 DIAGNOSIS — M25611 Stiffness of right shoulder, not elsewhere classified: Secondary | ICD-10-CM

## 2022-08-31 DIAGNOSIS — M25511 Pain in right shoulder: Secondary | ICD-10-CM

## 2022-08-31 NOTE — Therapy (Unsigned)
OUTPATIENT PHYSICAL THERAPY TREATMENT NOTE   Patient Name: Shawn Atkins MRN: [id number] DOB:[birth year], 66 y.o., male Today's Date: 08/31/2022  PCP: Lynnell Jude, MD REFERRING PROVIDER: Lynnell Jude, MD  END OF SESSION:   PT End of Session - 08/31/22 1532     Visit Number 4    Number of Visits 30    Date for PT Re-Evaluation 12/07/22    Authorization Type [employer] Medicare, VL based on medical necessity    PT Start Time 1517    PT Stop Time 1558    PT Time Calculation (min) 41 min    Equipment Utilized During Treatment --   BREG sling for R shoulder   Activity Tolerance Patient limited by pain;Patient tolerated treatment well    Behavior During Therapy WFL for tasks assessed/performed               Past Medical History:  Diagnosis Date   BPH (benign prostatic hypertrophy)    Bronchitis    Chronic prostatitis    Complication of anesthesia    Felt like "couldn't breathe" after triple Hernia surgery   Diabetes mellitus without complication (HCC)    Flank pain    GERD (gastroesophageal reflux disease)    h/o   Gross hematuria    History of kidney stones    HTN (hypertension)    pt states he takes lisinopril for kidney protection due to dm not htn   Inguinal hernia    Nocturia    OSA on CPAP    with 02 2 L   Overweight    PONV (postoperative nausea and vomiting)    only during kidney stone surgery   Spermatocele    Stricture of urethra    Vertigo    1 episode, 6-7 yrs ago   Past Surgical History:  Procedure Laterality Date   BICEPT TENODESIS Right 08/06/2022   Procedure: BICEPS TENODESIS;  Surgeon: [name], MD;  Location: [facility];  Service: Orthopedics;  Laterality: Right;   CATARACT EXTRACTION W/PHACO Left 07/22/2020   Procedure: CATARACT EXTRACTION PHACO AND INTRAOCULAR LENS PLACEMENT ([facility]) LEFT DIABETIC 4.14  00:33.0;  Surgeon: Eulogio Bear, MD;  Location: [place];  Service: Ophthalmology;  Laterality: Left;  Diabetic - oral meds    CATARACT EXTRACTION W/PHACO Right 08/12/2020   Procedure: CATARACT EXTRACTION PHACO AND INTRAOCULAR LENS PLACEMENT ([place]) RIGHT DIABETIC;  Surgeon: Eulogio Bear, MD;  Location: [place];  Service: Ophthalmology;  Laterality: Right;  1.99 0:26.2   COLONOSCOPY WITH PROPOFOL N/A 11/20/2015   Procedure: COLONOSCOPY WITH PROPOFOL;  Surgeon: Christene Lye, MD;  Location: [facility];  Service: Endoscopy;  Laterality: N/A;   HERNIA REPAIR Bilateral 6761   umbilical and bil inguinal/ Dr Marina Gravel   KIDNEY STONE SURGERY     KNEE ARTHROSCOPY Left    open lithotomy     SHOULDER ARTHROSCOPY WITH OPEN ROTATOR CUFF REPAIR AND DISTAL CLAVICLE ACROMINECTOMY Right 08/06/2022   Procedure: SHOULDER ARTHROSCOPY WITH OPEN ROTATOR CUFF REPAIR AND DISTAL CLAVICLE ACROMINECTOMY;  Surgeon: [name], MD;  Location: [facility];  Service: Orthopedics;  Laterality: Right;   Patient Active Problem List   Diagnosis Date Noted   History of rectal bleeding 06/01/2018   Left groin pain 03/18/2018   Sacroiliac joint pain 01/13/2017   Abdominal pain, left lower quadrant 08/23/2015   BPH with obstruction/lower urinary tract symptoms 08/23/2015   Biceps tendinitis 06/25/2015    REFERRING DIAG: M75.101 Unspecified rotator cuff tear or rupture of right  shoulder, not specified as traumatic  THERAPY DIAG:  Acute pain of right shoulder  Stiffness of right shoulder, not elsewhere classified  Muscle weakness (generalized)  Rationale for Evaluation and Treatment Rehabilitation   PERTINENT HISTORY: Pt is a 66 year old male s/p R shoulder rotator cuff repair and distal clavicle acromionectomy with Dr. Mack Guise on 08/06/22. Patient reports that his surgeon informed him he did have full tear of rotator cuff in surgery. Patient reports 4 days of severe pain just out of surgery. Patient reports managing pain well with Tylenol/Ibuprofen well now. Pt followed up with [facility] for erythema and  increased temperature as well as notable edema; ruled out infection and DVT. Patient reports comorbid neck and R upper trap pain. Pt reports difficulty with sleep positioning due to comorbid neck pain.    Pain:  Pain Intensity: Present: 2-3/10, Best: 2/10, Worst: 5-6/10 Pain location: R shoulder along deltoid and biceps Pain Quality: steady pain, intense intermittently; sharp pain along ACJ that comes and goes Radiating: Yes , to anterior arm Numbness/Tingling: No Aggravating factors: moving his R arm Relieving factors: Ibuprofen, Tylenol, resting arm/arm propped on armrest  History of prior shoulder or neck/shoulder injury, pain, surgery, or therapy: Yes, L shoulder with hx of cortisone injections, Hx of neck injury/PT for his neck Falls: Has patient fallen in last 6 months? No Dominant hand: left Imaging: Yes , MRI pre-operatively Prior level of function: Independent Occupational demands: Pt works part-time with [employer] (20 hrs/week), pt out of work now on medical leave Hobbies: [employer] (personal history of cancer, chills/fever, night sweats, nausea, vomiting, unrelenting pain): Negative   Precautions: None   Weight Bearing Restrictions: No   Living Environment Lives with: lives with their spouse Lives in: House/apartment     Patient Goals: Able to pick up weight with his arm, fish; able to play with baseball/bat with his grandson    PRECAUTIONS: No R shoulder AROM, no RUE lifting   DOS: 08/06/22, s/p R large RTC repair, R distal acromionectomy   SUBJECTIVE:                                                                                                                                                                                      SUBJECTIVE STATEMENT:  Patient reports relatively low pain of operative shoulder at this time, though he does have ongoing discomfort affecting R upper trapezius and R paracervical region. Pt is compliant with sling use, current post-op  restrictions, and HEP.    PAIN:  Are you having pain? Yes: NPRS scale: 2/10 Pain location: proximal arm/deltoid region     OBJECTIVE: (objective measures completed at initial evaluation unless otherwise dated)  Patient Surveys  FOTO: 4, predicted outcome score of 20     Cognition Patient is oriented to person, place, and time.  Recent memory is intact.  Remote memory is intact.  Attention span and concentration are intact.  Expressive speech is intact.  Patient's fund of knowledge is within normal limits for educational level.                          Gross Musculoskeletal Assessment Tremor: None Bulk: Atrophy of deltoid/posterior cuff mm Tone: Normal   Observation No sign of acute infection, mild erythema along R upper arm, mild increased tissue temperature along R middle deltoid         Posture FHRS posture     AROM       AROM (Normal range in degrees) AROM 08/17/2022    Right Left  Shoulder      Flexion Deferred    Extension Deferred    Abduction Deferred    External Rotation Deferred    Internal Rotation Deferred    Hands Behind Head Deferred    Hands Behind Back Deferred           Elbow      Flexion WNL    Extension WNL    Pronation 50    Supination 60    (* = pain; Blank rows = not tested)     PROM     PROM (Normal range in degrees) AROM 08/17/2022    Right Left  Shoulder      Flexion 40    Extension      Abduction      External Rotation (arm at side) To neutral/0 deg    Internal Rotation (arm at side) 45          LE MMT: Manual muscle tests deferred to later date, post-op restrictions MMT (out of 5) Right 08/17/2022 Left 08/17/2022         Shoulder   Flexion      Extension      Abduction      External rotation      Internal rotation      Horizontal abduction      Horizontal adduction      Lower Trapezius      Rhomboids             Elbow  Flexion      Extension      Pronation      Supination             Wrist   Flexion      Extension      Radial deviation      Ulnar deviation      (* = pain; Blank rows = not tested)     Sensation Deferred   Reflexes Deferred     Palpation   Location LEFT  RIGHT           Subocciptials      Cervical paraspinals      Upper Trapezius      Levator Scapulae      Rhomboid Major/Minor      Sternoclavicular joint      Acromioclavicular joint   2  Coracoid process   1  Long head of biceps   1  Supraspinatus   2  Infraspinatus   1  Subscapularis      Teres Minor      Teres Major      Pectoralis  Major      Pectoralis Minor      Anterior Deltoid   1  Lateral Deltoid   2  Posterior Deltoid      Latissimus Dorsi      Sternocleidomastoid      (Blank rows = not tested) Graded on 0-4 scale (0 = no pain, 1 = pain, 2 = pain with wincing/grimacing/flinching, 3 = pain with withdrawal, 4 = unwilling to allow palpation), (Blank rows = not tested)       TODAY'S TREATMENT      Manual Therapy - for R shoulder ROM and to prevent R shoulder stiffness   R shoulder PROM into flexion and ER to neutral, ROM within pt tolerance, gentle oscillations of upper limb during ROM; x 20 minutes Gentle STM to R anterior and middle deltoid, biceps, upper trapezius x 5 minutes  -Utilized Biofreeze on R upper trapezius for analgesic effect      Therapeutic Exercise - for HEP establishment, discussion on appropriate exercise/activity modification, PT education     Scapular retractions, standing; 10x, 5 sec - for review  Pendulums; forward/backward and circles CW/CCW; x20   Assisted patient with donning/doffing BREG sling with pt essentially dependent for managing straps.      Cold pack (unbilled) - for anti-inflammatory and analgesic effect as needed for reduced pain and improved ability to participate in active PT intervention, along R shoulder in sitting, pillow propped under R forearm for upper limb support, x 5 minutes   *not today* Gripping; pink Theraputty;  composite fist, performed x 2 minutes Wrist AROM; flexion/extension, RD/UD, pronation/supination; reviewed for HEP    PATIENT EDUCATION:  Education details: Plan of care Person educated: Patient Education method: Explanation Education comprehension: verbalized understanding     HOME EXERCISE PROGRAM: Access Code: [email or web address] URL: [email or web address] Date: 08/17/2022 Prepared by: Valentina Gu   Exercises - Flexion-Extension Shoulder Pendulum with Table Support  - 2 x daily - 7 x weekly - 3 sets - 10 reps - Seated Scapular Retraction  - 2 x daily - 7 x weekly - 2 sets - 10 reps - 3sec hold     ASSESSMENT:   CLINICAL IMPRESSION: Patient is able to attain R shoulder PROM up to 90 degrees today. Pt demonstrates gradually improving mobility passively and is currently refraining from active motion of operative shoulder. Patient does have Hx of neck pain/stiffness and likely has pain along R paracervical region and R upper trapezius aggravated by positioning in sling. Utilized manual therapy and topical analgesic treatment for mitigation of discomfort along this region today. Pt is currently under PROM/protective phase of rehab with massive RCR protocol. Patient has ongoing post-operative impairments in R shoulder AROM and PROM, post-operative pain, edema, decreased strength. Patient will benefit from continued skilled PT to address above impairments and improve overall function.   REHAB POTENTIAL: Good   CLINICAL DECISION MAKING: Stable/uncomplicated   EVALUATION COMPLEXITY: Low     GOALS: Goals reviewed with patient? Yes   SHORT TERM GOALS: Target date: 09/14/2022   Pt will be independent with HEP to minimize adverse effects of immobilization and gently mobilize R shoulder to improve pain-free function at home and work. Baseline: 08/17/22: Baseline HEP initiated Goal status: INITIAL     LONG TERM GOALS: Target date: 12/07/22   Pt will increase FOTO to at least  56 to demonstrate significant improvement in function at home and work related to neck pain  Baseline: 08/17/22: 4 Goal status: INITIAL   2.  Pt will  decrease worst shoulder pain by at least 3 points on the NPRS in order to demonstrate clinically significant reduction in shoulder pain. Baseline: 08/17/22: 5-6/10 at worst over last week Goal status: INITIAL   3.   Pt will have R shoulder AROM at least within 10 degrees of contralateral upper extremity indicative of improved ROM as needed for reaching, overhead work, self-care activities/dressing/grooming Baseline: 08/17/22: NO AROM presently, severely limited PROM in early post-op phase.    Goal status: INITIAL   4.   Pt will have R shoulder MMTs at 4+/5 or greater for all directions performed indicative of improved strength as needed for completion of daily lifting, reaching, carrying tasks (e.g. 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   Pt will perform unilateral farmer's carry with 15 lbs for 100 ft without reproduction of pain simulating carrying items in [employer] including carrying trash bag  Baseline: 08/17/22: Unable to perform lifting/carrying Goal status: INITIAL     PLAN: PT FREQUENCY: 1-2x/week   PT DURATION: 12 weeks   PLANNED INTERVENTIONS: Therapeutic exercises, Therapeutic activity, Neuromuscular re-education, Balance training, Gait training, Patient/Family education,  Joint mobilization, Electrical stimulation, Cryotherapy, Moist heat, and Manual therapy   PLAN FOR NEXT SESSION: R shoulder PROM up to 140 deg forward elevation only as tolerated, ER to neutral. Elbow and wrist/forearm AROM. Gripping as tolerated. Emphasis on protection of RTC repair in early-phase PT. Cryotherapy and modalities for pain prn.       Valentina Gu, PT, DPT #[id number]  Eilleen Kempf, PT 08/31/2022, 3:32 PM

## 2022-09-01 ENCOUNTER — Encounter: Payer: Self-pay | Admitting: Physical Therapy

## 2022-09-02 ENCOUNTER — Ambulatory Visit: Payer: Medicare Other | Admitting: Physical Therapy

## 2022-09-02 DIAGNOSIS — M6281 Muscle weakness (generalized): Secondary | ICD-10-CM

## 2022-09-02 DIAGNOSIS — M25511 Pain in right shoulder: Secondary | ICD-10-CM | POA: Diagnosis not present

## 2022-09-02 DIAGNOSIS — M25611 Stiffness of right shoulder, not elsewhere classified: Secondary | ICD-10-CM

## 2022-09-02 NOTE — Therapy (Signed)
OUTPATIENT PHYSICAL THERAPY TREATMENT NOTE   Patient Name: Shawn Atkins MRN: [id number] DOB:July 12, 1956, 66 y.o., male Today's Date: 09/02/2022  PCP: Lynnell Jude, MD REFERRING PROVIDER: Lynnell Jude, MD  END OF SESSION:   PT End of Session - 09/02/22 1458     Visit Number 5    Number of Visits 30    Date for PT Re-Evaluation 12/07/22    Authorization Type [employer] Medicare, VL based on medical necessity    PT Start Time 1500    PT Stop Time 1542    PT Time Calculation (min) 42 min    Equipment Utilized During Treatment --   BREG sling for R shoulder   Activity Tolerance Patient limited by pain;Patient tolerated treatment well    Behavior During Therapy WFL for tasks assessed/performed             Past Medical History:  Diagnosis Date   BPH (benign prostatic hypertrophy)    Bronchitis    Chronic prostatitis    Complication of anesthesia    Felt like "couldn't breathe" after triple Hernia surgery   Diabetes mellitus without complication (HCC)    Flank pain    GERD (gastroesophageal reflux disease)    h/o   Gross hematuria    History of kidney stones    HTN (hypertension)    pt states he takes lisinopril for kidney protection due to dm not htn   Inguinal hernia    Nocturia    OSA on CPAP    with 02 2 L   Overweight    PONV (postoperative nausea and vomiting)    only during kidney stone surgery   Spermatocele    Stricture of urethra    Vertigo    1 episode, 6-7 yrs ago   Past Surgical History:  Procedure Laterality Date   BICEPT TENODESIS Right 08/06/2022   Procedure: BICEPS TENODESIS;  Surgeon: [name], MD;  Location: [facility];  Service: Orthopedics;  Laterality: Right;   CATARACT EXTRACTION W/PHACO Left 07/22/2020   Procedure: CATARACT EXTRACTION PHACO AND INTRAOCULAR LENS PLACEMENT (IOC) LEFT DIABETIC 4.14  00:33.0;  Surgeon: Eulogio Bear, MD;  Location: [place];  Service: Ophthalmology;  Laterality: Left;  Diabetic - oral meds    CATARACT EXTRACTION W/PHACO Right 08/12/2020   Procedure: CATARACT EXTRACTION PHACO AND INTRAOCULAR LENS PLACEMENT ([place]) RIGHT DIABETIC;  Surgeon: Eulogio Bear, MD;  Location: [place];  Service: Ophthalmology;  Laterality: Right;  1.99 0:26.2   COLONOSCOPY WITH PROPOFOL N/A 11/20/2015   Procedure: COLONOSCOPY WITH PROPOFOL;  Surgeon: Christene Lye, MD;  Location: [facility];  Service: Endoscopy;  Laterality: N/A;   HERNIA REPAIR Bilateral 0865   umbilical and bil inguinal/ Dr Marina Gravel   KIDNEY STONE SURGERY     KNEE ARTHROSCOPY Left    open lithotomy     SHOULDER ARTHROSCOPY WITH OPEN ROTATOR CUFF REPAIR AND DISTAL CLAVICLE ACROMINECTOMY Right 08/06/2022   Procedure: SHOULDER ARTHROSCOPY WITH OPEN ROTATOR CUFF REPAIR AND DISTAL CLAVICLE ACROMINECTOMY;  Surgeon: [name], MD;  Location: [facility];  Service: Orthopedics;  Laterality: Right;   Patient Active Problem List   Diagnosis Date Noted   History of rectal bleeding 06/01/2018   Left groin pain 03/18/2018   Sacroiliac joint pain 01/13/2017   Abdominal pain, left lower quadrant 08/23/2015   BPH with obstruction/lower urinary tract symptoms 08/23/2015   Biceps tendinitis 06/25/2015    REFERRING DIAG: M75.101 Unspecified rotator cuff tear or rupture of right shoulder, not  specified as traumatic  THERAPY DIAG:  Acute pain of right shoulder  Stiffness of right shoulder, not elsewhere classified  Muscle weakness (generalized)  Rationale for Evaluation and Treatment Rehabilitation   PERTINENT HISTORY: Pt is a 66 year old male s/p R shoulder rotator cuff repair and distal clavicle acromionectomy with Dr. Mack Guise on 08/06/22. Patient reports that his surgeon informed him he did have full tear of rotator cuff in surgery. Patient reports 4 days of severe pain just out of surgery. Patient reports managing pain well with Tylenol/Ibuprofen well now. Pt followed up with [facility] for erythema and increased  temperature as well as notable edema; ruled out infection and DVT. Patient reports comorbid neck and R upper trap pain. Pt reports difficulty with sleep positioning due to comorbid neck pain.    Pain:  Pain Intensity: Present: 2-3/10, Best: 2/10, Worst: 5-6/10 Pain location: R shoulder along deltoid and biceps Pain Quality: steady pain, intense intermittently; sharp pain along ACJ that comes and goes Radiating: Yes , to anterior arm Numbness/Tingling: No Aggravating factors: moving his R arm Relieving factors: Ibuprofen, Tylenol, resting arm/arm propped on armrest  History of prior shoulder or neck/shoulder injury, pain, surgery, or therapy: Yes, L shoulder with hx of cortisone injections, Hx of neck injury/PT for his neck Falls: Has patient fallen in last 6 months? No Dominant hand: left Imaging: Yes , MRI pre-operatively Prior level of function: Independent Occupational demands: Pt works part-time with [employer] (20 hrs/week), pt out of work now on medical leave Hobbies: [employer] (personal history of cancer, chills/fever, night sweats, nausea, vomiting, unrelenting pain): Negative   Precautions: None   Weight Bearing Restrictions: No   Living Environment Lives with: lives with their spouse Lives in: House/apartment     Patient Goals: Able to pick up weight with his arm, fish; able to play with baseball/bat with his grandson    PRECAUTIONS: No R shoulder AROM, no RUE lifting   DOS: 08/06/22, s/p R large RTC repair, R distal acromionectomy   SUBJECTIVE:                                                                                                                                                                                      SUBJECTIVE STATEMENT:  Patient reports discomfort along R paracervical region. He reports pain following PROM; he uses OTC medication and ice at home and he is able to control pain. Patient reports compliance with HEP and sling use/activity  restrictions.    PAIN:  Are you having pain? Yes: NPRS scale: 2/10 pain at arrival  Pain location: proximal arm/deltoid region     OBJECTIVE: (objective measures completed at initial evaluation unless  otherwise dated)     Patient Surveys  FOTO: 4, predicted outcome score of 28     Cognition Patient is oriented to person, place, and time.  Recent memory is intact.  Remote memory is intact.  Attention span and concentration are intact.  Expressive speech is intact.  Patient's fund of knowledge is within normal limits for educational level.                          Gross Musculoskeletal Assessment Tremor: None Bulk: Atrophy of deltoid/posterior cuff mm Tone: Normal   Observation No sign of acute infection, mild erythema along R upper arm, mild increased tissue temperature along R middle deltoid         Posture FHRS posture     AROM       AROM (Normal range in degrees) AROM 08/17/2022    Right Left  Shoulder      Flexion Deferred    Extension Deferred    Abduction Deferred    External Rotation Deferred    Internal Rotation Deferred    Hands Behind Head Deferred    Hands Behind Back Deferred           Elbow      Flexion WNL    Extension WNL    Pronation 50    Supination 60    (* = pain; Blank rows = not tested)     PROM     PROM (Normal range in degrees) AROM 08/17/2022    Right Left  Shoulder      Flexion 40    Extension      Abduction      External Rotation (arm at side) To neutral/0 deg    Internal Rotation (arm at side) 45          LE MMT: Manual muscle tests deferred to later date, post-op restrictions MMT (out of 5) Right 08/17/2022 Left 08/17/2022         Shoulder   Flexion      Extension      Abduction      External rotation      Internal rotation      Horizontal abduction      Horizontal adduction      Lower Trapezius      Rhomboids             Elbow  Flexion      Extension      Pronation      Supination              Wrist  Flexion      Extension      Radial deviation      Ulnar deviation      (* = pain; Blank rows = not tested)     Sensation Deferred   Reflexes Deferred     Palpation   Location LEFT  RIGHT           Subocciptials      Cervical paraspinals      Upper Trapezius      Levator Scapulae      Rhomboid Major/Minor      Sternoclavicular joint      Acromioclavicular joint   2  Coracoid process   1  Long head of biceps   1  Supraspinatus   2  Infraspinatus   1  Subscapularis      Teres Minor      Teres Major  Pectoralis Major      Pectoralis Minor      Anterior Deltoid   1  Lateral Deltoid   2  Posterior Deltoid      Latissimus Dorsi      Sternocleidomastoid      (Blank rows = not tested) Graded on 0-4 scale (0 = no pain, 1 = pain, 2 = pain with wincing/grimacing/flinching, 3 = pain with withdrawal, 4 = unwilling to allow palpation), (Blank rows = not tested)       TODAY'S TREATMENT      Manual Therapy - for R shoulder ROM and to prevent R shoulder stiffness   R shoulder PROM into flexion and ER to neutral, ROM within pt tolerance, gentle oscillations of upper limb during ROM; x 20 minutes Gentle STM to R anterior and middle deltoid, biceps, upper trapezius x 5 minutes  -Utilized Biofreeze on R upper trapezius for analgesic effect      Therapeutic Exercise - for HEP establishment, discussion on appropriate exercise/activity modification, PT education     Scapular retractions, standing; 2x10, 5 sec - for review  Pendulums; forward/backward and circles CW/CCW; x20   Posterior shoulder roll/scapular clock; x20  -attempted forward scapular roll with anterior shoulder pain, stopped this after 3 repetitions  Assisted patient with donning/doffing BREG sling with pt essentially dependent for managing straps.      Cold pack (unbilled) - for anti-inflammatory and analgesic effect as needed for reduced pain and improved ability to participate in active PT  intervention, along R shoulder in sitting, pillow propped under R forearm for upper limb support, x 5 minutes   *not today* Gripping; pink Theraputty; composite fist, performed x 2 minutes Wrist AROM; flexion/extension, RD/UD, pronation/supination; reviewed for HEP    PATIENT EDUCATION:  Education details: Plan of care Person educated: Patient Education method: Explanation Education comprehension: verbalized understanding     HOME EXERCISE PROGRAM: Access Code: [email or web address] URL: [email or web address] Date: 08/17/2022 Prepared by: Valentina Gu   Exercises - Flexion-Extension Shoulder Pendulum with Table Support  - 2 x daily - 7 x weekly - 3 sets - 10 reps - Seated Scapular Retraction  - 2 x daily - 7 x weekly - 2 sets - 10 reps - 3sec hold     ASSESSMENT:   CLINICAL IMPRESSION: Patient is able to attain R shoulder forward elevation PROM nearing current limit allowed per protocol. He tolerates passive ER up to 30 deg. He is still compliant with sling use and activity restrictions. Pt is continuing to maintain mobility of distal upper limb and is refraining from active motion of R shoulder during protective phase of rehab with massive RCR protocol. Patient has ongoing post-operative impairments in R shoulder AROM and PROM, post-operative pain, edema, decreased strength. Patient will benefit from continued skilled PT to address above impairments and improve overall function.   REHAB POTENTIAL: Good   CLINICAL DECISION MAKING: Stable/uncomplicated   EVALUATION COMPLEXITY: Low     GOALS: Goals reviewed with patient? Yes   SHORT TERM GOALS: Target date: 09/14/2022   Pt will be independent with HEP to minimize adverse effects of immobilization and gently mobilize R shoulder to improve pain-free function at home and work. Baseline: 08/17/22: Baseline HEP initiated Goal status: INITIAL     LONG TERM GOALS: Target date: 12/07/22   Pt will increase FOTO to at least  56 to demonstrate significant improvement in function at home and work related to neck pain  Baseline: 08/17/22: 4 Goal status: INITIAL   2.  Pt will decrease worst shoulder pain by at least 3 points on the NPRS in order to demonstrate clinically significant reduction in shoulder pain. Baseline: 08/17/22: 5-6/10 at worst over last week Goal status: INITIAL   3.   Pt will have R shoulder AROM at least within 10 degrees of contralateral upper extremity indicative of improved ROM as needed for reaching, overhead work, self-care activities/dressing/grooming Baseline: 08/17/22: NO AROM presently, severely limited PROM in early post-op phase.    Goal status: INITIAL   4.   Pt will have R shoulder MMTs at 4+/5 or greater for all directions performed indicative of improved strength as needed for completion of daily lifting, reaching, carrying tasks (e.g. 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   Pt will perform unilateral farmer's carry with 15 lbs for 100 ft without reproduction of pain simulating carrying items in [employer] including carrying trash bag  Baseline: 08/17/22: Unable to perform lifting/carrying Goal status: INITIAL     PLAN: PT FREQUENCY: 1-2x/week   PT DURATION: 12 weeks   PLANNED INTERVENTIONS: Therapeutic exercises, Therapeutic activity, Neuromuscular re-education, Balance training, Gait training, Patient/Family education,  Joint mobilization, Electrical stimulation, Cryotherapy, Moist heat, and Manual therapy   PLAN FOR NEXT SESSION: R shoulder PROM up to 140 deg forward elevation only as tolerated, ER to neutral. Elbow and wrist/forearm AROM. Gripping as tolerated. Emphasis on protection of RTC repair in early-phase PT. Cryotherapy and modalities for pain prn.       Valentina Gu, PT, DPT #[id number]  Eilleen Kempf, PT 09/02/2022, 3:04 PM

## 2022-09-07 ENCOUNTER — Ambulatory Visit: Payer: Medicare Other | Admitting: Physical Therapy

## 2022-09-07 ENCOUNTER — Encounter: Payer: Self-pay | Admitting: Physical Therapy

## 2022-09-07 DIAGNOSIS — M25511 Pain in right shoulder: Secondary | ICD-10-CM

## 2022-09-07 DIAGNOSIS — M25611 Stiffness of right shoulder, not elsewhere classified: Secondary | ICD-10-CM

## 2022-09-07 DIAGNOSIS — M6281 Muscle weakness (generalized): Secondary | ICD-10-CM

## 2022-09-07 NOTE — Therapy (Signed)
OUTPATIENT PHYSICAL THERAPY TREATMENT NOTE   Patient Name: Shawn Atkins MRN: [id number] DOB:[birth year], 66 y.o., male Today's Date: 09/09/2022  PCP: Dortha Kern, MD REFERRING PROVIDER: Dortha Kern, MD  END OF SESSION:   PT End of Session - 09/09/22 0538     Visit Number 6    Number of Visits 30    Date for PT Re-Evaluation 12/07/22    Authorization Type [employer] Medicare, VL based on medical necessity    PT Start Time 1503    PT Stop Time 1545    PT Time Calculation (min) 42 min    Equipment Utilized During Treatment --   BREG sling for R shoulder   Activity Tolerance Patient limited by pain;Patient tolerated treatment well    Behavior During Therapy WFL for tasks assessed/performed              Past Medical History:  Diagnosis Date   BPH (benign prostatic hypertrophy)    Bronchitis    Chronic prostatitis    Complication of anesthesia    Felt like "couldn't breathe" after triple Hernia surgery   Diabetes mellitus without complication (HCC)    Flank pain    GERD (gastroesophageal reflux disease)    h/o   Gross hematuria    History of kidney stones    HTN (hypertension)    pt states he takes lisinopril for kidney protection due to dm not htn   Inguinal hernia    Nocturia    OSA on CPAP    with 02 2 L   Overweight    PONV (postoperative nausea and vomiting)    only during kidney stone surgery   Spermatocele    Stricture of urethra    Vertigo    1 episode, 6-7 yrs ago   Past Surgical History:  Procedure Laterality Date   BICEPT TENODESIS Right 08/06/2022   Procedure: BICEPS TENODESIS;  Surgeon: Juanell Fairly, MD;  Location: [facility];  Service: Orthopedics;  Laterality: Right;   CATARACT EXTRACTION W/PHACO Left 07/22/2020   Procedure: CATARACT EXTRACTION PHACO AND INTRAOCULAR LENS PLACEMENT (IOC) LEFT DIABETIC 4.14  00:33.0;  Surgeon: Nevada Crane, MD;  Location: [facility];  Service: Ophthalmology;  Laterality: Left;  Diabetic - oral meds    CATARACT EXTRACTION W/PHACO Right 08/12/2020   Procedure: CATARACT EXTRACTION PHACO AND INTRAOCULAR LENS PLACEMENT (IOC) RIGHT DIABETIC;  Surgeon: Nevada Crane, MD;  Location: [facility];  Service: Ophthalmology;  Laterality: Right;  1.99 0:26.2   COLONOSCOPY WITH PROPOFOL N/A 11/20/2015   Procedure: COLONOSCOPY WITH PROPOFOL;  Surgeon: Kieth Brightly, MD;  Location: [facility];  Service: Endoscopy;  Laterality: N/A;   HERNIA REPAIR Bilateral 2014   umbilical and bil inguinal/ Dr Egbert Garibaldi   KIDNEY STONE SURGERY     KNEE ARTHROSCOPY Left    open lithotomy     SHOULDER ARTHROSCOPY WITH OPEN ROTATOR CUFF REPAIR AND DISTAL CLAVICLE ACROMINECTOMY Right 08/06/2022   Procedure: SHOULDER ARTHROSCOPY WITH OPEN ROTATOR CUFF REPAIR AND DISTAL CLAVICLE ACROMINECTOMY;  Surgeon: Juanell Fairly, MD;  Location: [facility];  Service: Orthopedics;  Laterality: Right;   Patient Active Problem List   Diagnosis Date Noted   History of rectal bleeding 06/01/2018   Left groin pain 03/18/2018   Sacroiliac joint pain 01/13/2017   Abdominal pain, left lower quadrant 08/23/2015   BPH with obstruction/lower urinary tract symptoms 08/23/2015   Biceps tendinitis 06/25/2015    REFERRING DIAG: M75.101 Unspecified rotator cuff tear or rupture of right shoulder,  not specified as traumatic  THERAPY DIAG:  Acute pain of right shoulder  Stiffness of right shoulder, not elsewhere classified  Muscle weakness (generalized)  Rationale for Evaluation and Treatment Rehabilitation   PERTINENT HISTORY: Pt is a 66 year old male s/p R shoulder rotator cuff repair and distal clavicle acromionectomy with Dr. Martha Clan on 08/06/22. Patient reports that his surgeon informed him he did have full tear of rotator cuff in surgery. Patient reports 4 days of severe pain just out of surgery. Patient reports managing pain well with Tylenol/Ibuprofen well now. Pt followed up with [facility] for erythema and  increased temperature as well as notable edema; ruled out infection and DVT. Patient reports comorbid neck and R upper trap pain. Pt reports difficulty with sleep positioning due to comorbid neck pain.    Pain:  Pain Intensity: Present: 2-3/10, Best: 2/10, Worst: 5-6/10 Pain location: R shoulder along deltoid and biceps Pain Quality: steady pain, intense intermittently; sharp pain along ACJ that comes and goes Radiating: Yes , to anterior arm Numbness/Tingling: No Aggravating factors: moving his R arm Relieving factors: Ibuprofen, Tylenol, resting arm/arm propped on armrest  History of prior shoulder or neck/shoulder injury, pain, surgery, or therapy: Yes, L shoulder with hx of cortisone injections, Hx of neck injury/PT for his neck Falls: Has patient fallen in last 6 months? No Dominant hand: left Imaging: Yes , MRI pre-operatively Prior level of function: Independent Occupational demands: Pt works part-time with [employer] (20 hrs/week), pt out of work now on medical leave Hobbies: [employer] (personal history of cancer, chills/fever, night sweats, nausea, vomiting, unrelenting pain): Negative   Precautions: None   Weight Bearing Restrictions: No   Living Environment Lives with: lives with their spouse Lives in: House/apartment     Patient Goals: Able to pick up weight with his arm, fish; able to play with baseball/bat with his grandson    PRECAUTIONS: No R shoulder AROM, no RUE lifting   DOS: 08/06/22, s/p R large RTC repair, R distal acromionectomy   SUBJECTIVE:                                                                                                                                                                                      SUBJECTIVE STATEMENT:  Patient reports difficulty sleeping the previous evening. Patient reports having significant pain in his arm yesterday. He reports his arm/anterior shoulder has felt hot and he reports some erythema in this region.  Patient reports compliance with HEP and he is only taking his sling off only for HEP and hygiene purposes. Pt is still avoiding R shoulder AROM.    PAIN:  Are you having pain? Yes: NPRS  scale: 3/10 pain at arrival  Pain location: proximal arm/deltoid region     OBJECTIVE: (objective measures completed at initial evaluation unless otherwise dated)     Patient Surveys  FOTO: 4, predicted outcome score of 56     Cognition Patient is oriented to person, place, and time.  Recent memory is intact.  Remote memory is intact.  Attention span and concentration are intact.  Expressive speech is intact.  Patient's fund of knowledge is within normal limits for educational level.                          Gross Musculoskeletal Assessment Tremor: None Bulk: Atrophy of deltoid/posterior cuff mm Tone: Normal   Observation No sign of acute infection, mild erythema along R upper arm, mild increased tissue temperature along R middle deltoid         Posture FHRS posture     AROM       AROM (Normal range in degrees) AROM 08/17/2022    Right Left  Shoulder      Flexion Deferred    Extension Deferred    Abduction Deferred    External Rotation Deferred    Internal Rotation Deferred    Hands Behind Head Deferred    Hands Behind Back Deferred           Elbow      Flexion WNL    Extension WNL    Pronation 50    Supination 60    (* = pain; Blank rows = not tested)     PROM     PROM (Normal range in degrees) AROM 08/17/2022    Right Left  Shoulder      Flexion 40    Extension      Abduction      External Rotation (arm at side) To neutral/0 deg    Internal Rotation (arm at side) 45          LE MMT: Manual muscle tests deferred to later date, post-op restrictions MMT (out of 5) Right 08/17/2022 Left 08/17/2022         Shoulder   Flexion      Extension      Abduction      External rotation      Internal rotation      Horizontal abduction      Horizontal  adduction      Lower Trapezius      Rhomboids             Elbow  Flexion      Extension      Pronation      Supination             Wrist  Flexion      Extension      Radial deviation      Ulnar deviation      (* = pain; Blank rows = not tested)     Sensation Deferred   Reflexes Deferred     Palpation   Location LEFT  RIGHT           Subocciptials      Cervical paraspinals      Upper Trapezius      Levator Scapulae      Rhomboid Major/Minor      Sternoclavicular joint      Acromioclavicular joint   2  Coracoid process   1  Long head of biceps   1  Supraspinatus  2  Infraspinatus   1  Subscapularis      Teres Minor      Teres Major      Pectoralis Major      Pectoralis Minor      Anterior Deltoid   1  Lateral Deltoid   2  Posterior Deltoid      Latissimus Dorsi      Sternocleidomastoid      (Blank rows = not tested) Graded on 0-4 scale (0 = no pain, 1 = pain, 2 = pain with wincing/grimacing/flinching, 3 = pain with withdrawal, 4 = unwilling to allow palpation), (Blank rows = not tested)       TODAY'S TREATMENT      Manual Therapy - for R shoulder ROM and to prevent R shoulder stiffness   R shoulder PROM into flexion and ER to neutral, ROM within pt tolerance, gentle oscillations of upper limb during ROM; x 20 minutes Gentle STM to R anterior and middle deltoid, biceps, upper trapezius x 5 minutes  -Utilized Biofreeze on R upper trapezius and R biceps for analgesic effect      Therapeutic Exercise - for HEP establishment, discussion on appropriate exercise/activity modification, PT education     Scapular retractions, standing; 2x10, 5 sec - for review  Pendulums; forward/backward and circles CW/CCW; x20   Posterior shoulder roll/scapular clock; x20  Assisted patient with donning/doffing BREG sling with pt essentially dependent for managing straps.      PATIENT EDUCATION: Discussed signs of infection and signs of DVT. Discussed low likelihood  of re-tearing given no significant activities performed against protocol/current restrictions and no trauma. Educated patient on continuing pain control methods at home including use of ice pack prn and continuing OTC medication as directed by his physician.     Cold pack (unbilled) - for anti-inflammatory and analgesic effect as needed for reduced pain and improved ability to participate in active PT intervention, along R shoulder in sitting, pillow propped under R forearm for upper limb support, x 5 minutes   *not today* Gripping; pink Theraputty; composite fist, performed x 2 minutes Wrist AROM; flexion/extension, RD/UD, pronation/supination; reviewed for HEP    PATIENT EDUCATION:  Education details: Plan of care Person educated: Patient Education method: Explanation Education comprehension: verbalized understanding     HOME EXERCISE PROGRAM: Access Code: [email or web address] URL: [email or web address] Date: 08/17/2022 Prepared by: Consuela Mimes   Exercises - Flexion-Extension Shoulder Pendulum with Table Support  - 2 x daily - 7 x weekly - 3 sets - 10 reps - Seated Scapular Retraction  - 2 x daily - 7 x weekly - 2 sets - 10 reps - 3sec hold     ASSESSMENT:   CLINICAL IMPRESSION: Patient is concerned with potential increased temperature of R arm and erythema. Pt does not have notable signs of infection and portal incisions are fully closed at this time. Pt does not have concerning signs of DVT or embolism. Completed overview of concerning S&S for which to monitor and to discuss any concerns with his surgeon at his f/u appointment. Pt is able to tolerate PROM up to current limits of post-operative protocol, but he does still have notable pain similar to that observed commonly in early post-operative phase. Patient has ongoing post-operative impairments in R shoulder AROM and PROM, post-operative pain, edema, decreased strength. Patient will benefit from continued skilled PT to  address above impairments and improve overall function.   REHAB POTENTIAL: Good   CLINICAL DECISION MAKING: Stable/uncomplicated   EVALUATION COMPLEXITY: Low  GOALS: Goals reviewed with patient? Yes   SHORT TERM GOALS: Target date: 09/14/2022   Pt will be independent with HEP to minimize adverse effects of immobilization and gently mobilize R shoulder to improve pain-free function at home and work. Baseline: 08/17/22: Baseline HEP initiated Goal status: INITIAL     LONG TERM GOALS: Target date: 12/07/22   Pt will increase FOTO to at least 56 to demonstrate significant improvement in function at home and work related to neck pain  Baseline: 08/17/22: 4 Goal status: INITIAL   2.  Pt will decrease worst shoulder pain by at least 3 points on the NPRS in order to demonstrate clinically significant reduction in shoulder pain. Baseline: 08/17/22: 5-6/10 at worst over last week Goal status: INITIAL   3.   Pt will have R shoulder AROM at least within 10 degrees of contralateral upper extremity indicative of improved ROM as needed for reaching, overhead work, self-care activities/dressing/grooming Baseline: 08/17/22: NO AROM presently, severely limited PROM in early post-op phase.    Goal status: INITIAL   4.   Pt will have R shoulder MMTs at 4+/5 or greater for all directions performed indicative of improved strength as needed for completion of daily lifting, reaching, carrying tasks (e.g. 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   Pt will perform unilateral farmer's carry with 15 lbs for 100 ft without reproduction of pain simulating carrying items in [employer] including carrying trash bag  Baseline: 08/17/22: Unable to perform lifting/carrying Goal status: INITIAL     PLAN: PT FREQUENCY: 1-2x/week   PT DURATION: 12 weeks   PLANNED INTERVENTIONS: Therapeutic exercises,  Therapeutic activity, Neuromuscular re-education, Balance training, Gait training, Patient/Family education,  Joint mobilization, Electrical stimulation, Cryotherapy, Moist heat, and Manual therapy   PLAN FOR NEXT SESSION: R shoulder PROM up to 140 deg forward elevation only as tolerated, ER to neutral. Elbow and wrist/forearm AROM. Gripping as tolerated. Emphasis on protection of RTC repair in early-phase PT. Cryotherapy and modalities for pain prn.       Consuela Mimes, PT, DPT #[id number]  Gertie Exon, PT 09/09/2022, 5:39 AM

## 2022-09-09 ENCOUNTER — Encounter: Payer: Self-pay | Admitting: Physical Therapy

## 2022-09-09 ENCOUNTER — Ambulatory Visit: Payer: Medicare Other | Admitting: Physical Therapy

## 2022-09-09 DIAGNOSIS — M25511 Pain in right shoulder: Secondary | ICD-10-CM | POA: Diagnosis not present

## 2022-09-09 DIAGNOSIS — M6281 Muscle weakness (generalized): Secondary | ICD-10-CM

## 2022-09-09 DIAGNOSIS — M25611 Stiffness of right shoulder, not elsewhere classified: Secondary | ICD-10-CM

## 2022-09-09 NOTE — Therapy (Signed)
OUTPATIENT PHYSICAL THERAPY TREATMENT NOTE   Patient Name: Shawn Atkins MRN: [id number] DOB:22-Sep-1956, 66 y.o., male Today's Date: 09/09/2022  PCP: Lynnell Jude, MD REFERRING PROVIDER: Lynnell Jude, MD  END OF SESSION:   PT End of Session - 09/09/22 1505     Visit Number 7    Number of Visits 30    Date for PT Re-Evaluation 12/07/22    Authorization Type [employer] Medicare, VL based on medical necessity    PT Start Time 1501    PT Stop Time 1548    PT Time Calculation (min) 47 min    Equipment Utilized During Treatment --   BREG sling for R shoulder   Activity Tolerance Patient limited by pain;Patient tolerated treatment well    Behavior During Therapy WFL for tasks assessed/performed               Past Medical History:  Diagnosis Date   BPH (benign prostatic hypertrophy)    Bronchitis    Chronic prostatitis    Complication of anesthesia    Felt like "couldn't breathe" after triple Hernia surgery   Diabetes mellitus without complication (HCC)    Flank pain    GERD (gastroesophageal reflux disease)    h/o   Gross hematuria    History of kidney stones    HTN (hypertension)    pt states he takes lisinopril for kidney protection due to dm not htn   Inguinal hernia    Nocturia    OSA on CPAP    with 02 2 L   Overweight    PONV (postoperative nausea and vomiting)    only during kidney stone surgery   Spermatocele    Stricture of urethra    Vertigo    1 episode, 6-7 yrs ago   Past Surgical History:  Procedure Laterality Date   BICEPT TENODESIS Right 08/06/2022   Procedure: BICEPS TENODESIS;  Surgeon: [name], MD;  Location: [facility];  Service: Orthopedics;  Laterality: Right;   CATARACT EXTRACTION W/PHACO Left 07/22/2020   Procedure: CATARACT EXTRACTION PHACO AND INTRAOCULAR LENS PLACEMENT ([facility]) LEFT DIABETIC 4.14  00:33.0;  Surgeon: Eulogio Bear, MD;  Location: [place];  Service: Ophthalmology;  Laterality: Left;  Diabetic - oral  meds   CATARACT EXTRACTION W/PHACO Right 08/12/2020   Procedure: CATARACT EXTRACTION PHACO AND INTRAOCULAR LENS PLACEMENT ([place]) RIGHT DIABETIC;  Surgeon: Eulogio Bear, MD;  Location: [place];  Service: Ophthalmology;  Laterality: Right;  1.99 0:26.2   COLONOSCOPY WITH PROPOFOL N/A 11/20/2015   Procedure: COLONOSCOPY WITH PROPOFOL;  Surgeon: Christene Lye, MD;  Location: [facility];  Service: Endoscopy;  Laterality: N/A;   HERNIA REPAIR Bilateral 7939   umbilical and bil inguinal/ Dr Marina Gravel   KIDNEY STONE SURGERY     KNEE ARTHROSCOPY Left    open lithotomy     SHOULDER ARTHROSCOPY WITH OPEN ROTATOR CUFF REPAIR AND DISTAL CLAVICLE ACROMINECTOMY Right 08/06/2022   Procedure: SHOULDER ARTHROSCOPY WITH OPEN ROTATOR CUFF REPAIR AND DISTAL CLAVICLE ACROMINECTOMY;  Surgeon: [name], MD;  Location: [facility];  Service: Orthopedics;  Laterality: Right;   Patient Active Problem List   Diagnosis Date Noted   History of rectal bleeding 06/01/2018   Left groin pain 03/18/2018   Sacroiliac joint pain 01/13/2017   Abdominal pain, left lower quadrant 08/23/2015   BPH with obstruction/lower urinary tract symptoms 08/23/2015   Biceps tendinitis 06/25/2015    REFERRING DIAG: M75.101 Unspecified rotator cuff tear or rupture of right  shoulder, not specified as traumatic  THERAPY DIAG:  Acute pain of right shoulder  Stiffness of right shoulder, not elsewhere classified  Muscle weakness (generalized)  Rationale for Evaluation and Treatment Rehabilitation   PERTINENT HISTORY: Pt is a 66 year old male s/p R shoulder rotator cuff repair and distal clavicle acromionectomy with Dr. Mack Guise on 08/06/22. Patient reports that his surgeon informed him he did have full tear of rotator cuff in surgery. Patient reports 4 days of severe pain just out of surgery. Patient reports managing pain well with Tylenol/Ibuprofen well now. Pt followed up with [facility] for erythema and  increased temperature as well as notable edema; ruled out infection and DVT. Patient reports comorbid neck and R upper trap pain. Pt reports difficulty with sleep positioning due to comorbid neck pain.    Pain:  Pain Intensity: Present: 2-3/10, Best: 2/10, Worst: 5-6/10 Pain location: R shoulder along deltoid and biceps Pain Quality: steady pain, intense intermittently; sharp pain along ACJ that comes and goes Radiating: Yes , to anterior arm Numbness/Tingling: No Aggravating factors: moving his R arm Relieving factors: Ibuprofen, Tylenol, resting arm/arm propped on armrest  History of prior shoulder or neck/shoulder injury, pain, surgery, or therapy: Yes, L shoulder with hx of cortisone injections, Hx of neck injury/PT for his neck Falls: Has patient fallen in last 6 months? No Dominant hand: left Imaging: Yes , MRI pre-operatively Prior level of function: Independent Occupational demands: Pt works part-time with [employer] (20 hrs/week), pt out of work now on medical leave Hobbies: [employer] (personal history of cancer, chills/fever, night sweats, nausea, vomiting, unrelenting pain): Negative   Precautions: None   Weight Bearing Restrictions: No   Living Environment Lives with: lives with their spouse Lives in: House/apartment     Patient Goals: Able to pick up weight with his arm, fish; able to play with baseball/bat with his grandson    PRECAUTIONS: No R shoulder AROM, no RUE lifting   DOS: 08/06/22, s/p R large RTC repair, R distal acromionectomy   SUBJECTIVE:                                                                                                                                                                                      SUBJECTIVE STATEMENT:  Patient reports doing well with pain control today. He denies notable pain at arrival. He had follow-up with his surgeon and was told that he is on track with current post-op progress. Pt is able to discontinue  abduction pillow and not use sling at home. Patient uses sling for going outside of his home now per surgeon's instructions. Patient is compliant with HEP and activity modifications/post-op restrictions.  PAIN:  Are you having pain? Yes: NPRS scale: 2/10 pain at arrival  Pain location: proximal arm/deltoid region     OBJECTIVE: (objective measures completed at initial evaluation unless otherwise dated)     Patient Surveys  FOTO: 4, predicted outcome score of 56     Cognition Patient is oriented to person, place, and time.  Recent memory is intact.  Remote memory is intact.  Attention span and concentration are intact.  Expressive speech is intact.  Patient's fund of knowledge is within normal limits for educational level.                          Gross Musculoskeletal Assessment Tremor: None Bulk: Atrophy of deltoid/posterior cuff mm Tone: Normal   Observation No sign of acute infection, mild erythema along R upper arm, mild increased tissue temperature along R middle deltoid         Posture FHRS posture     AROM       AROM (Normal range in degrees) AROM 08/17/2022    Right Left  Shoulder      Flexion Deferred    Extension Deferred    Abduction Deferred    External Rotation Deferred    Internal Rotation Deferred    Hands Behind Head Deferred    Hands Behind Back Deferred           Elbow      Flexion WNL    Extension WNL    Pronation 50    Supination 60    (* = pain; Blank rows = not tested)     PROM     PROM (Normal range in degrees) AROM 08/17/2022    Right Left  Shoulder      Flexion 40    Extension      Abduction      External Rotation (arm at side) To neutral/0 deg    Internal Rotation (arm at side) 45          LE MMT: Manual muscle tests deferred to later date, post-op restrictions MMT (out of 5) Right 08/17/2022 Left 08/17/2022         Shoulder   Flexion      Extension      Abduction      External rotation      Internal  rotation      Horizontal abduction      Horizontal adduction      Lower Trapezius      Rhomboids             Elbow  Flexion      Extension      Pronation      Supination             Wrist  Flexion      Extension      Radial deviation      Ulnar deviation      (* = pain; Blank rows = not tested)     Sensation Deferred   Reflexes Deferred     Palpation   Location LEFT  RIGHT           Subocciptials      Cervical paraspinals      Upper Trapezius      Levator Scapulae      Rhomboid Major/Minor      Sternoclavicular joint      Acromioclavicular joint   2  Coracoid process   1  Long head  of biceps   1  Supraspinatus   2  Infraspinatus   1  Subscapularis      Teres Minor      Teres Major      Pectoralis Major      Pectoralis Minor      Anterior Deltoid   1  Lateral Deltoid   2  Posterior Deltoid      Latissimus Dorsi      Sternocleidomastoid      (Blank rows = not tested) Graded on 0-4 scale (0 = no pain, 1 = pain, 2 = pain with wincing/grimacing/flinching, 3 = pain with withdrawal, 4 = unwilling to allow palpation), (Blank rows = not tested)       TODAY'S TREATMENT      Manual Therapy - for R shoulder ROM and to prevent R shoulder stiffness   R shoulder PROM into flexion and ER to neutral, ROM within pt tolerance, gentle oscillations of upper limb during ROM; x 22 minutes Gentle STM to R anterior and middle deltoid, biceps, upper trapezius x 2 minutes       Therapeutic Exercise - for HEP establishment, discussion on appropriate exercise/activity modification, PT education     Scapular retractions, standing; 1x10, 5 sec - for review  Pendulums; forward/backward and circles CW/CCW; x20   Posterior shoulder roll/scapular clock; x20  Assisted patient with donning/doffing BREG sling with pt essentially dependent for managing straps.      PATIENT EDUCATION: Reviewed post-op restrictions.     Cold pack (unbilled) - for anti-inflammatory and  analgesic effect as needed for reduced pain and improved ability to participate in active PT intervention, along R shoulder in sitting, pillow propped under R forearm for upper limb support, x 5 minutes    *not today* Gripping; pink Theraputty; composite fist, performed x 2 minutes Wrist AROM; flexion/extension, RD/UD, pronation/supination; reviewed for HEP    PATIENT EDUCATION:  Education details: Plan of care Person educated: Patient Education method: Explanation Education comprehension: verbalized understanding     HOME EXERCISE PROGRAM: Access Code: [email or web address] URL: [email or web address] Date: 08/17/2022 Prepared by: Valentina Gu   Exercises - Flexion-Extension Shoulder Pendulum with Table Support  - 2 x daily - 7 x weekly - 3 sets - 10 reps - Seated Scapular Retraction  - 2 x daily - 7 x weekly - 2 sets - 10 reps - 3sec hold     ASSESSMENT:   CLINICAL IMPRESSION: Patient is concerned with potential increased temperature of R arm and erythema. Pt does not have notable signs of infection and portal incisions are fully closed at this time. Pt does not have concerning signs of DVT or embolism. Completed overview of concerning S&S for which to monitor and to discuss any concerns with his surgeon at his f/u appointment. Pt is able to tolerate PROM up to current limits of post-operative protocol, but he does still have notable pain similar to that observed commonly in early post-operative phase. Patient has ongoing post-operative impairments in R shoulder AROM and PROM, post-operative pain, edema, decreased strength. Patient will benefit from continued skilled PT to address above impairments and improve overall function.   REHAB POTENTIAL: Good   CLINICAL DECISION MAKING: Stable/uncomplicated   EVALUATION COMPLEXITY: Low     GOALS: Goals reviewed with patient? Yes   SHORT TERM GOALS: Target date: 09/14/2022   Pt will be independent with HEP to minimize adverse  effects of immobilization and gently mobilize R shoulder to improve pain-free function at home and work. Baseline: 08/17/22:  Baseline HEP initiated Goal status: INITIAL     LONG TERM GOALS: Target date: 12/07/22   Pt will increase FOTO to at least 56 to demonstrate significant improvement in function at home and work related to neck pain  Baseline: 08/17/22: 4 Goal status: INITIAL   2.  Pt will decrease worst shoulder pain by at least 3 points on the NPRS in order to demonstrate clinically significant reduction in shoulder pain. Baseline: 08/17/22: 5-6/10 at worst over last week Goal status: INITIAL   3.   Pt will have R shoulder AROM at least within 10 degrees of contralateral upper extremity indicative of improved ROM as needed for reaching, overhead work, self-care activities/dressing/grooming Baseline: 08/17/22: NO AROM presently, severely limited PROM in early post-op phase.    Goal status: INITIAL   4.   Pt will have R shoulder MMTs at 4+/5 or greater for all directions performed indicative of improved strength as needed for completion of daily lifting, reaching, carrying tasks (e.g. 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   Pt will perform unilateral farmer's carry with 15 lbs for 100 ft without reproduction of pain simulating carrying items in [employer] including carrying trash bag  Baseline: 08/17/22: Unable to perform lifting/carrying Goal status: INITIAL     PLAN: PT FREQUENCY: 1-2x/week   PT DURATION: 12 weeks   PLANNED INTERVENTIONS: Therapeutic exercises, Therapeutic activity, Neuromuscular re-education, Balance training, Gait training, Patient/Family education,  Joint mobilization, Electrical stimulation, Cryotherapy, Moist heat, and Manual therapy   PLAN FOR NEXT SESSION: R shoulder PROM up to 140 deg forward elevation only as tolerated, ER to neutral. Elbow  and wrist/forearm AROM. Gripping as tolerated. Emphasis on protection of RTC repair in early-phase PT. Cryotherapy and modalities for pain prn.       THIS NOTE IS INCOMPLETE, PLEASE DO NOT REFERENCE FOR INFORMATION    Valentina Gu, PT, DPT #[id number]  Eilleen Kempf, PT 09/09/2022, 3:44 PM

## 2022-09-14 ENCOUNTER — Ambulatory Visit: Payer: Medicare Other | Admitting: Physical Therapy

## 2022-09-14 ENCOUNTER — Encounter: Payer: Self-pay | Admitting: Physical Therapy

## 2022-09-14 ENCOUNTER — Ambulatory Visit: Payer: Medicare Other | Admitting: Urology

## 2022-09-14 DIAGNOSIS — M25611 Stiffness of right shoulder, not elsewhere classified: Secondary | ICD-10-CM

## 2022-09-14 DIAGNOSIS — M25511 Pain in right shoulder: Secondary | ICD-10-CM

## 2022-09-14 DIAGNOSIS — M6281 Muscle weakness (generalized): Secondary | ICD-10-CM

## 2022-09-14 NOTE — Therapy (Signed)
OUTPATIENT PHYSICAL THERAPY TREATMENT NOTE   Patient Name: Shawn Atkins MRN: [id number] DOB:10/17/1956, 66 y.o., male Today's Date: 09/15/2022  PCP: Lynnell Jude, MD REFERRING PROVIDER: [name], MD  END OF SESSION:   PT End of Session - 09/14/22 1401     Visit Number 8    Number of Visits 30    Date for PT Re-Evaluation 12/07/22    Authorization Type UHC Medicare, VL based on medical necessity    PT Start Time 1330    PT Stop Time 1413    PT Time Calculation (min) 43 min    Equipment Utilized During Treatment --   BREG sling for R shoulder   Activity Tolerance Patient limited by pain;Patient tolerated treatment well    Behavior During Therapy WFL for tasks assessed/performed                Past Medical History:  Diagnosis Date   BPH (benign prostatic hypertrophy)    Bronchitis    Chronic prostatitis    Complication of anesthesia    Felt like "couldn't breathe" after triple Hernia surgery   Diabetes mellitus without complication (HCC)    Flank pain    GERD (gastroesophageal reflux disease)    h/o   Gross hematuria    History of kidney stones    HTN (hypertension)    pt states he takes lisinopril for kidney protection due to dm not htn   Inguinal hernia    Nocturia    OSA on CPAP    with 02 2 L   Overweight    PONV (postoperative nausea and vomiting)    only during kidney stone surgery   Spermatocele    Stricture of urethra    Vertigo    1 episode, 6-7 yrs ago   Past Surgical History:  Procedure Laterality Date   BICEPT TENODESIS Right 08/06/2022   Procedure: BICEPS TENODESIS;  Surgeon: [name], MD;  Location: [facility];  Service: Orthopedics;  Laterality: Right;   CATARACT EXTRACTION W/PHACO Left 07/22/2020   Procedure: CATARACT EXTRACTION PHACO AND INTRAOCULAR LENS PLACEMENT (I[facility]) LEFT DIABETIC 4.14  00:33.0;  Surgeon: Eulogio Bear, MD;  Location: [place];  Service: Ophthalmology;  Laterality: Left;  Diabetic - oral  meds   CATARACT EXTRACTION W/PHACO Right 08/12/2020   Procedure: CATARACT EXTRACTION PHACO AND INTRAOCULAR LENS PLACEMENT ([place]) RIGHT DIABETIC;  Surgeon: Eulogio Bear, MD;  Location: [place];  Service: Ophthalmology;  Laterality: Right;  1.99 0:26.2   COLONOSCOPY WITH PROPOFOL N/A 11/20/2015   Procedure: COLONOSCOPY WITH PROPOFOL;  Surgeon: Christene Lye, MD;  Location: [facility];  Service: Endoscopy;  Laterality: N/A;   HERNIA REPAIR Bilateral 1884   umbilical and bil inguinal/ Dr Marina Gravel   KIDNEY STONE SURGERY     KNEE ARTHROSCOPY Left    open lithotomy     SHOULDER ARTHROSCOPY WITH OPEN ROTATOR CUFF REPAIR AND DISTAL CLAVICLE ACROMINECTOMY Right 08/06/2022   Procedure: SHOULDER ARTHROSCOPY WITH OPEN ROTATOR CUFF REPAIR AND DISTAL CLAVICLE ACROMINECTOMY;  Surgeon: [name], MD;  Location: [facility];  Service: Orthopedics;  Laterality: Right;   Patient Active Problem List   Diagnosis Date Noted   History of rectal bleeding 06/01/2018   Left groin pain 03/18/2018   Sacroiliac joint pain 01/13/2017   Abdominal pain, left lower quadrant 08/23/2015   BPH with obstruction/lower urinary tract symptoms 08/23/2015   Biceps tendinitis 06/25/2015    REFERRING DIAG: M75.101 Unspecified rotator cuff tear or rupture of right  shoulder, not specified as traumatic  THERAPY DIAG:  Acute pain of right shoulder  Stiffness of right shoulder, not elsewhere classified  Muscle weakness (generalized)  Rationale for Evaluation and Treatment Rehabilitation   PERTINENT HISTORY: Pt is a 66 year old male s/p R shoulder rotator cuff repair and distal clavicle acromionectomy with Dr. Mack Guise on 08/06/22. Patient reports that his surgeon informed him he did have full tear of rotator cuff in surgery. Patient reports 4 days of severe pain just out of surgery. Patient reports managing pain well with Tylenol/Ibuprofen well now. Pt followed up with [facility] for erythema and  increased temperature as well as notable edema; ruled out infection and DVT. Patient reports comorbid neck and R upper trap pain. Pt reports difficulty with sleep positioning due to comorbid neck pain.    Pain:  Pain Intensity: Present: 2-3/10, Best: 2/10, Worst: 5-6/10 Pain location: R shoulder along deltoid and biceps Pain Quality: steady pain, intense intermittently; sharp pain along ACJ that comes and goes Radiating: Yes , to anterior arm Numbness/Tingling: No Aggravating factors: moving his R arm Relieving factors: Ibuprofen, Tylenol, resting arm/arm propped on armrest  History of prior shoulder or neck/shoulder injury, pain, surgery, or therapy: Yes, L shoulder with hx of cortisone injections, Hx of neck injury/PT for his neck Falls: Has patient fallen in last 6 months? No Dominant hand: left Imaging: Yes , MRI pre-operatively Prior level of function: Independent Occupational demands: Pt works part-time with [employer] (20 hrs/week), pt out of work now on medical leave Hobbies: [employer] (personal history of cancer, chills/fever, night sweats, nausea, vomiting, unrelenting pain): Negative   Precautions: None   Weight Bearing Restrictions: No   Living Environment Lives with: lives with their spouse Lives in: House/apartment     Patient Goals: Able to pick up weight with his arm, fish; able to play with baseball/bat with his grandson    PRECAUTIONS: No R shoulder AROM, no RUE lifting   DOS: 08/06/22, s/p R large RTC repair, R distal acromionectomy -8 weeks post-op 10/01/22   SUBJECTIVE:                                                                                                                                                                                      SUBJECTIVE STATEMENT:  Patient reports minimal pain at arrival. Patient reports tolerating last session well. Pt reports feeling better with being cleared to spend more time outside of his sling. He reports  intermittent numbness/tinging down to his R thumb.    PAIN:  Are you having pain? Yes: NPRS scale: 3/10 pain at arrival  Pain location: proximal arm/deltoid region     OBJECTIVE: (objective  measures completed at initial evaluation unless otherwise dated)     Patient Surveys  FOTO: 4, predicted outcome score of 32     Cognition Patient is oriented to person, place, and time.  Recent memory is intact.  Remote memory is intact.  Attention span and concentration are intact.  Expressive speech is intact.  Patient's fund of knowledge is within normal limits for educational level.                          Gross Musculoskeletal Assessment Tremor: None Bulk: Atrophy of deltoid/posterior cuff mm Tone: Normal   Observation No sign of acute infection, mild erythema along R upper arm, mild increased tissue temperature along R middle deltoid         Posture FHRS posture     AROM       AROM (Normal range in degrees) AROM 08/17/2022    Right Left  Shoulder      Flexion Deferred    Extension Deferred    Abduction Deferred    External Rotation Deferred    Internal Rotation Deferred    Hands Behind Head Deferred    Hands Behind Back Deferred           Elbow      Flexion WNL    Extension WNL    Pronation 50    Supination 60    (* = pain; Blank rows = not tested)     PROM     PROM (Normal range in degrees) AROM 08/17/2022    Right Left  Shoulder      Flexion 40    Extension      Abduction      External Rotation (arm at side) To neutral/0 deg    Internal Rotation (arm at side) 45          LE MMT: Manual muscle tests deferred to later date, post-op restrictions MMT (out of 5) Right 08/17/2022 Left 08/17/2022         Shoulder   Flexion      Extension      Abduction      External rotation      Internal rotation      Horizontal abduction      Horizontal adduction      Lower Trapezius      Rhomboids             Elbow  Flexion      Extension       Pronation      Supination             Wrist  Flexion      Extension      Radial deviation      Ulnar deviation      (* = pain; Blank rows = not tested)     Sensation Deferred   Reflexes Deferred     Palpation   Location LEFT  RIGHT           Subocciptials      Cervical paraspinals      Upper Trapezius      Levator Scapulae      Rhomboid Major/Minor      Sternoclavicular joint      Acromioclavicular joint   2  Coracoid process   1  Long head of biceps   1  Supraspinatus   2  Infraspinatus   1  Subscapularis      Teres Minor  Teres Major      Pectoralis Major      Pectoralis Minor      Anterior Deltoid   1  Lateral Deltoid   2  Posterior Deltoid      Latissimus Dorsi      Sternocleidomastoid      (Blank rows = not tested) Graded on 0-4 scale (0 = no pain, 1 = pain, 2 = pain with wincing/grimacing/flinching, 3 = pain with withdrawal, 4 = unwilling to allow palpation), (Blank rows = not tested)       TODAY'S TREATMENT      Manual Therapy - for R shoulder ROM and to prevent R shoulder stiffness   R shoulder PROM into flexion and ER to neutral, ROM within pt tolerance, gentle oscillations of upper limb during ROM; x 22 minutes Gentle STM to R anterior and middle deltoid, biceps, upper trapezius x 2 minutes       Therapeutic Exercise - for HEP establishment, discussion on appropriate exercise/activity modification, PT education     Scapular retractions, standing; 1x10, 5 sec - for review  Pendulums; forward/backward and circles CW/CCW; x20 each  Posterior and anterior shoulder roll/scapular circles; x20 each direction   Assisted patient with donning/doffing BREG sling with pt MinA for managing straps.       PATIENT EDUCATION: Reviewed post-op restrictions.     Cold pack (unbilled) - for anti-inflammatory and analgesic effect as needed for reduced pain and improved ability to participate in active PT intervention, along R shoulder in sitting,  pillow propped under R forearm for upper limb support, x 5 minutes    *not today* Gripping; pink Theraputty; composite fist, performed x 2 minutes Wrist AROM; flexion/extension, RD/UD, pronation/supination; reviewed for HEP    PATIENT EDUCATION:  Education details: Plan of care Person educated: Patient Education method: Explanation Education comprehension: verbalized understanding     HOME EXERCISE PROGRAM: Access Code: [email or web address] URL: [email or web address] Date: 08/17/2022 Prepared by: Valentina Gu   Exercises - Flexion-Extension Shoulder Pendulum with Table Support  - 2 x daily - 7 x weekly - 3 sets - 10 reps - Seated Scapular Retraction  - 2 x daily - 7 x weekly - 2 sets - 10 reps - 3sec hold     ASSESSMENT:   CLINICAL IMPRESSION: Patient is tolerating PROM up to current limits of post-operative protocol. Pt has intermittent paresthesias affecting L thumb; his physician informed him this can be associated with rotator cuff repair. Pt does have Hx of neck pain, though concordant pain was not reproduced with cervical radiculopathy testing during prior episode of care. Pt is appropriate for decreased frequency of PT until he is safe to proceed with active-assisted ROM of L shoulder and program in PT as well as HEP can be progressed. Pt will drop to 1x/week until 10/01/22. Patient has ongoing post-operative impairments in R shoulder AROM and PROM, post-operative pain, edema, decreased strength. Patient will benefit from continued skilled PT to address above impairments and improve overall function.   REHAB POTENTIAL: Good   CLINICAL DECISION MAKING: Stable/uncomplicated   EVALUATION COMPLEXITY: Low     GOALS: Goals reviewed with patient? Yes   SHORT TERM GOALS: Target date: 09/14/2022   Pt will be independent with HEP to minimize adverse effects of immobilization and gently mobilize R shoulder to improve pain-free function at home and work. Baseline:  08/17/22: Baseline HEP initiated Goal status: INITIAL     LONG TERM GOALS: Target date: 12/07/22   Pt will increase FOTO to  at least 56 to demonstrate significant improvement in function at home and work related to neck pain  Baseline: 08/17/22: 4 Goal status: INITIAL   2.  Pt will decrease worst shoulder pain by at least 3 points on the NPRS in order to demonstrate clinically significant reduction in shoulder pain. Baseline: 08/17/22: 5-6/10 at worst over last week Goal status: INITIAL   3.   Pt will have R shoulder AROM at least within 10 degrees of contralateral upper extremity indicative of improved ROM as needed for reaching, overhead work, self-care activities/dressing/grooming Baseline: 08/17/22: NO AROM presently, severely limited PROM in early post-op phase.    Goal status: INITIAL   4.   Pt will have R shoulder MMTs at 4+/5 or greater for all directions performed indicative of improved strength as needed for completion of daily lifting, reaching, carrying tasks (e.g. 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   Pt will perform unilateral farmer's carry with 15 lbs for 100 ft without reproduction of pain simulating carrying items in [employer] including carrying trash bag  Baseline: 08/17/22: Unable to perform lifting/carrying Goal status: INITIAL     PLAN: PT FREQUENCY: 1-2x/week   PT DURATION: 12 weeks   PLANNED INTERVENTIONS: Therapeutic exercises, Therapeutic activity, Neuromuscular re-education, Balance training, Gait training, Patient/Family education,  Joint mobilization, Electrical stimulation, Cryotherapy, Moist heat, and Manual therapy   PLAN FOR NEXT SESSION: R shoulder PROM up to 140 deg forward elevation only as tolerated, ER to neutral. Elbow and wrist/forearm AROM. Gripping as tolerated. Emphasis on protection of RTC repair in early-phase PT. Cryotherapy and  modalities for pain prn.     Valentina Gu, PT, DPT #[id number]  Eilleen Kempf, PT 09/15/2022, 9:28 AM

## 2022-09-16 ENCOUNTER — Telehealth: Payer: Self-pay | Admitting: *Deleted

## 2022-09-16 ENCOUNTER — Ambulatory Visit: Payer: Medicare Other

## 2022-09-16 DIAGNOSIS — M25511 Pain in right shoulder: Secondary | ICD-10-CM

## 2022-09-16 DIAGNOSIS — M25611 Stiffness of right shoulder, not elsewhere classified: Secondary | ICD-10-CM

## 2022-09-16 DIAGNOSIS — N138 Other obstructive and reflux uropathy: Secondary | ICD-10-CM

## 2022-09-16 DIAGNOSIS — M6281 Muscle weakness (generalized): Secondary | ICD-10-CM

## 2022-09-16 DIAGNOSIS — N401 Enlarged prostate with lower urinary tract symptoms: Secondary | ICD-10-CM

## 2022-09-16 DIAGNOSIS — N529 Male erectile dysfunction, unspecified: Secondary | ICD-10-CM

## 2022-09-16 DIAGNOSIS — E349 Endocrine disorder, unspecified: Secondary | ICD-10-CM

## 2022-09-16 NOTE — Telephone Encounter (Signed)
Pt and wife called triage line, trying to reschedule lab appt for Shawn Atkins, I advised that [facility] lab does not take appts. Pt and wife wanted to push out lab appt due to pt not having needles, they will go to [facility] on Monday 09/28/22 for labs, they will follow-up with shannon on 10/05/22.  What labs did you want him to have? Orders are not in

## 2022-09-16 NOTE — Therapy (Signed)
OUTPATIENT PHYSICAL THERAPY TREATMENT NOTE   Patient Name: Shawn Atkins MRN: [id number] DOB:1956-06-09, 66 y.o., male Today's Date: 09/16/2022  PCP: Lynnell Jude, MD REFERRING PROVIDER: [name], MD  END OF SESSION:   PT End of Session - 09/16/22 1330     Visit Number 9    Number of Visits 30    Date for PT Re-Evaluation 12/07/22    Authorization Type UHC Medicare, VL based on medical necessity    Authorization Time Period 08/18/23-10/26/22    Progress Note Due on Visit 10    PT Start Time 1330    PT Stop Time 1410    PT Time Calculation (min) 40 min    Activity Tolerance Patient limited by pain;Patient tolerated treatment well    Behavior During Therapy WFL for tasks assessed/performed                Past Medical History:  Diagnosis Date   BPH (benign prostatic hypertrophy)    Bronchitis    Chronic prostatitis    Complication of anesthesia    Felt like "couldn't breathe" after triple Hernia surgery   Diabetes mellitus without complication (HCC)    Flank pain    GERD (gastroesophageal reflux disease)    h/o   Gross hematuria    History of kidney stones    HTN (hypertension)    pt states he takes lisinopril for kidney protection due to dm not htn   Inguinal hernia    Nocturia    OSA on CPAP    with 02 2 L   Overweight    PONV (postoperative nausea and vomiting)    only during kidney stone surgery   Spermatocele    Stricture of urethra    Vertigo    1 episode, 6-7 yrs ago   Past Surgical History:  Procedure Laterality Date   BICEPT TENODESIS Right 08/06/2022   Procedure: BICEPS TENODESIS;  Surgeon: [name], MD;  Location: [facility];  Service: Orthopedics;  Laterality: Right;   CATARACT EXTRACTION W/PHACO Left 07/22/2020   Procedure: CATARACT EXTRACTION PHACO AND INTRAOCULAR LENS PLACEMENT ([facility]) LEFT DIABETIC 4.14  00:33.0;  Surgeon: Eulogio Bear, MD;  Location: [place];  Service: Ophthalmology;  Laterality: Left;   Diabetic - oral meds   CATARACT EXTRACTION W/PHACO Right 08/12/2020   Procedure: CATARACT EXTRACTION PHACO AND INTRAOCULAR LENS PLACEMENT ([place]) RIGHT DIABETIC;  Surgeon: Eulogio Bear, MD;  Location: [place];  Service: Ophthalmology;  Laterality: Right;  1.99 0:26.2   COLONOSCOPY WITH PROPOFOL N/A 11/20/2015   Procedure: COLONOSCOPY WITH PROPOFOL;  Surgeon: Christene Lye, MD;  Location: [facility];  Service: Endoscopy;  Laterality: N/A;   HERNIA REPAIR Bilateral 8527   umbilical and bil inguinal/ Dr Marina Gravel   KIDNEY STONE SURGERY     KNEE ARTHROSCOPY Left    open lithotomy     SHOULDER ARTHROSCOPY WITH OPEN ROTATOR CUFF REPAIR AND DISTAL CLAVICLE ACROMINECTOMY Right 08/06/2022   Procedure: SHOULDER ARTHROSCOPY WITH OPEN ROTATOR CUFF REPAIR AND DISTAL CLAVICLE ACROMINECTOMY;  Surgeon: [name], MD;  Location: [facility];  Service: Orthopedics;  Laterality: Right;   Patient Active Problem List   Diagnosis Date Noted   History of rectal bleeding 06/01/2018   Left groin pain 03/18/2018   Sacroiliac joint pain 01/13/2017   Abdominal pain, left lower quadrant 08/23/2015   BPH with obstruction/lower urinary tract symptoms 08/23/2015   Biceps tendinitis 06/25/2015    REFERRING DIAG: M75.101 Unspecified rotator cuff tear or rupture  of right shoulder, not specified as traumatic  THERAPY DIAG:  Acute pain of right shoulder  Stiffness of right shoulder, not elsewhere classified  Muscle weakness (generalized)  Rationale for Evaluation and Treatment Rehabilitation   PERTINENT HISTORY: Pt is a 66 year old male s/p R shoulder rotator cuff repair and distal clavicle acromionectomy with Dr. Mack Guise on 08/06/22. Patient reports that his surgeon informed him he did have full tear of rotator cuff in surgery. Patient reports 4 days of severe pain just out of surgery. Patient reports managing pain well with Tylenol/Ibuprofen well now. Pt followed up with [facility] for  erythema and increased temperature as well as notable edema; ruled out infection and DVT. Patient reports comorbid neck and R upper trap pain. Pt reports difficulty with sleep positioning due to comorbid neck pain.     PRECAUTIONS: No R shoulder AROM, no RUE lifting   DOS: 08/06/22, s/p R large RTC repair, R distal acromionectomy -8 weeks post-op 10/01/22                                                                                                                                                                                      SUBJECTIVE STATEMENT:  HEP still going well. Still weaning slinging. No pertinent updates since prior session.    PAIN:  Are you having pain? Yes: NPRS scale: 3/10 pain at arrival  Pain location: proximal arm/deltoid region     OBJECTIVE:     TODAY'S TREATMENT  -seated table slides into flexion 15x3secH, 2x30sec H (slight caption improves comfort)   -seated table slides into Rt shoulder ABDCT 1x10x2secH, 2x30secH  -supine (Rt shoulder scar massage and education x5 minutes with neutral cream, clean technique) -isometric shoulder extension, ER, IR in neutral GHJ angle 1x12x3secH  -assist with sling    PATIENT EDUCATION:  Education details: Plan of care Person educated: Patient Education method: Explanation Education comprehension: verbalized understanding     HOME EXERCISE PROGRAM: Access Code: [email or web address] URL: [email or web address] Date: 08/17/2022 Prepared by: Valentina Gu   Exercises - Flexion-Extension Shoulder Pendulum with Table Support  - 2 x daily - 7 x weekly - 3 sets - 10 reps - Seated Scapular Retraction  - 2 x daily - 7 x weekly - 2 sets - 10 reps - 3sec hold     ASSESSMENT:   CLINICAL IMPRESSION:   Patient has ongoing post-operative impairments in R shoulder AROM and PROM, post-operative pain, edema, decreased strength. Patient will benefit from continued skilled PT to address above impairments and improve  overall function.   REHAB POTENTIAL: Good   CLINICAL DECISION MAKING: Stable/uncomplicated   EVALUATION COMPLEXITY: Low     GOALS: Goals reviewed  with patient? Yes   SHORT TERM GOALS: Target date: 09/14/2022   Pt will be independent with HEP to minimize adverse effects of immobilization and gently mobilize R shoulder to improve pain-free function at home and work. Baseline: 08/17/22: Baseline HEP initiated Goal status: INITIAL     LONG TERM GOALS: Target date: 12/07/22   Pt will increase FOTO to at least 56 to demonstrate significant improvement in function at home and work related to neck pain  Baseline: 08/17/22: 4 Goal status: INITIAL   2.  Pt will decrease worst shoulder pain by at least 3 points on the NPRS in order to demonstrate clinically significant reduction in shoulder pain. Baseline: 08/17/22: 5-6/10 at worst over last week Goal status: INITIAL   3.   Pt will have R shoulder AROM at least within 10 degrees of contralateral upper extremity indicative of improved ROM as needed for reaching, overhead work, self-care activities/dressing/grooming Baseline: 08/17/22: NO AROM presently, severely limited PROM in early post-op phase.    Goal status: INITIAL   4.   Pt will have R shoulder MMTs at 4+/5 or greater for all directions performed indicative of improved strength as needed for completion of daily lifting, reaching, carrying tasks (e.g. 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   Pt will perform unilateral farmer's carry with 15 lbs for 100 ft without reproduction of pain simulating carrying items in [employer] including carrying trash bag  Baseline: 08/17/22: Unable to perform lifting/carrying Goal status: INITIAL     PLAN: PT FREQUENCY: 1-2x/week   PT DURATION: 12 weeks   PLANNED INTERVENTIONS: Therapeutic exercises, Therapeutic activity, Neuromuscular  re-education, Balance training, Gait training, Patient/Family education,  Joint mobilization, Electrical stimulation, Cryotherapy, Moist heat, and Manual therapy   PLAN FOR NEXT SESSION: R shoulder PROM up to 140 deg forward elevation only as tolerated, ER to neutral. Elbow and wrist/forearm AROM. Gripping as tolerated. Emphasis on protection of RTC repair in early-phase PT. Cryotherapy and modalities for pain prn.    1:52 PM, 09/16/22 Etta Grandchild, PT, DPT Physical Therapist - [facility] in [place] (Office)     Garnavillo C, PT 09/16/2022, 1:51 PM

## 2022-09-21 ENCOUNTER — Ambulatory Visit: Payer: Medicare Other | Admitting: Urology

## 2022-09-21 ENCOUNTER — Encounter: Payer: Medicare Other | Admitting: Physical Therapy

## 2022-09-23 ENCOUNTER — Ambulatory Visit: Payer: Medicare Other

## 2022-09-23 DIAGNOSIS — M25611 Stiffness of right shoulder, not elsewhere classified: Secondary | ICD-10-CM

## 2022-09-23 DIAGNOSIS — M25511 Pain in right shoulder: Secondary | ICD-10-CM

## 2022-09-23 DIAGNOSIS — M6281 Muscle weakness (generalized): Secondary | ICD-10-CM

## 2022-09-23 NOTE — Therapy (Signed)
OUTPATIENT PHYSICAL THERAPY TREATMENT NOTE/Progress Note   Patient Name: Shawn Atkins MRN: [id number] DOB:15-Oct-1956, 66 y.o., male Today's Date: 09/23/2022  PCP: Lynnell Jude, MD REFERRING PROVIDER: [name], MD  END OF SESSION:   PT End of Session - 09/23/22 1403     Visit Number 10    Number of Visits 30    Date for PT Re-Evaluation 12/07/22    Authorization Type [employer] Medicare, VL based on medical necessity    Authorization Time Period 08/18/23-10/26/22    Progress Note Due on Visit 10    PT Start Time 1330    PT Stop Time 1415    PT Time Calculation (min) 45 min    Activity Tolerance Patient limited by pain;Patient tolerated treatment well    Behavior During Therapy WFL for tasks assessed/performed                Past Medical History:  Diagnosis Date   BPH (benign prostatic hypertrophy)    Bronchitis    Chronic prostatitis    Complication of anesthesia    Felt like "couldn't breathe" after triple Hernia surgery   Diabetes mellitus without complication (HCC)    Flank pain    GERD (gastroesophageal reflux disease)    h/o   Gross hematuria    History of kidney stones    HTN (hypertension)    pt states he takes lisinopril for kidney protection due to dm not htn   Inguinal hernia    Nocturia    OSA on CPAP    with 02 2 L   Overweight    PONV (postoperative nausea and vomiting)    only during kidney stone surgery   Spermatocele    Stricture of urethra    Vertigo    1 episode, 6-7 yrs ago   Past Surgical History:  Procedure Laterality Date   BICEPT TENODESIS Right 08/06/2022   Procedure: BICEPS TENODESIS;  Surgeon: [name], MD;  Location: [facility];  Service: Orthopedics;  Laterality: Right;   CATARACT EXTRACTION W/PHACO Left 07/22/2020   Procedure: CATARACT EXTRACTION PHACO AND INTRAOCULAR LENS PLACEMENT (I[facility]) LEFT DIABETIC 4.14  00:33.0;  Surgeon: Eulogio Bear, MD;  Location: [place];  Service: Ophthalmology;   Laterality: Left;  Diabetic - oral meds   CATARACT EXTRACTION W/PHACO Right 08/12/2020   Procedure: CATARACT EXTRACTION PHACO AND INTRAOCULAR LENS PLACEMENT ([place]) RIGHT DIABETIC;  Surgeon: Eulogio Bear, MD;  Location: [place];  Service: Ophthalmology;  Laterality: Right;  1.99 0:26.2   COLONOSCOPY WITH PROPOFOL N/A 11/20/2015   Procedure: COLONOSCOPY WITH PROPOFOL;  Surgeon: Christene Lye, MD;  Location: [facility];  Service: Endoscopy;  Laterality: N/A;   HERNIA REPAIR Bilateral 7035   umbilical and bil inguinal/ Dr Marina Gravel   KIDNEY STONE SURGERY     KNEE ARTHROSCOPY Left    open lithotomy     SHOULDER ARTHROSCOPY WITH OPEN ROTATOR CUFF REPAIR AND DISTAL CLAVICLE ACROMINECTOMY Right 08/06/2022   Procedure: SHOULDER ARTHROSCOPY WITH OPEN ROTATOR CUFF REPAIR AND DISTAL CLAVICLE ACROMINECTOMY;  Surgeon: [name], MD;  Location: [facility];  Service: Orthopedics;  Laterality: Right;   Patient Active Problem List   Diagnosis Date Noted   History of rectal bleeding 06/01/2018   Left groin pain 03/18/2018   Sacroiliac joint pain 01/13/2017   Abdominal pain, left lower quadrant 08/23/2015   BPH with obstruction/lower urinary tract symptoms 08/23/2015   Biceps tendinitis 06/25/2015    REFERRING DIAG: M75.101 Unspecified rotator cuff tear or  rupture of right shoulder, not specified as traumatic  THERAPY DIAG:  Acute pain of right shoulder  Stiffness of right shoulder, not elsewhere classified  Muscle weakness (generalized)  Rationale for Evaluation and Treatment Rehabilitation   PERTINENT HISTORY: Pt is a 66 year old male s/p R shoulder rotator cuff repair and distal clavicle acromionectomy with Dr. Mack Guise on 08/06/22. Patient reports that his surgeon informed him he did have full tear of rotator cuff in surgery. Patient reports 4 days of severe pain just out of surgery. Patient reports managing pain well with Tylenol/Ibuprofen well now. Pt followed up  with [facility] for erythema and increased temperature as well as notable edema; ruled out infection and DVT. Patient reports comorbid neck and R upper trap pain. Pt reports difficulty with sleep positioning due to comorbid neck pain.    Pain:  Pain Intensity: Present: 2-3/10, Best: 2/10, Worst: 5-6/10 Pain location: R shoulder along deltoid and biceps Pain Quality: steady pain, intense intermittently; sharp pain along ACJ that comes and goes Radiating: Yes , to anterior arm Numbness/Tingling: No Aggravating factors: moving his R arm Relieving factors: Ibuprofen, Tylenol, resting arm/arm propped on armrest  History of prior shoulder or neck/shoulder injury, pain, surgery, or therapy: Yes, L shoulder with hx of cortisone injections, Hx of neck injury/PT for his neck Falls: Has patient fallen in last 6 months? No Dominant hand: left Imaging: Yes , MRI pre-operatively Prior level of function: Independent Occupational demands: Pt works part-time with [employer] (20 hrs/week), pt out of work now on medical leave Hobbies: [employer] (personal history of cancer, chills/fever, night sweats, nausea, vomiting, unrelenting pain): Negative   Precautions: None   Weight Bearing Restrictions: No   Living Environment Lives with: lives with their spouse Lives in: House/apartment     Patient Goals: Able to pick up weight with his arm, fish; able to play with baseball/bat with his grandson    PRECAUTIONS: No R shoulder AROM, no RUE lifting   DOS: 08/06/22, s/p R large RTC repair, R distal acromionectomy -8 weeks post-op 10/01/22   SUBJECTIVE:                                                                                                                                                                                      SUBJECTIVE STATEMENT:  Patient reports minimal pain at arrival. He does continue to note UE parasthesias in his R hand.  Overall he is feeling a little better this week.       PAIN:  Are you having pain? Yes: NPRS scale: 2/10 pain at arrival  Pain location: proximal arm/deltoid region     OBJECTIVE: (objective measures completed at initial  evaluation unless otherwise dated)     Patient Surveys  FOTO: 4, predicted outcome score of 66     Cognition Patient is oriented to person, place, and time.  Recent memory is intact.  Remote memory is intact.  Attention span and concentration are intact.  Expressive speech is intact.  Patient's fund of knowledge is within normal limits for educational level.                          Gross Musculoskeletal Assessment Tremor: None Bulk: Atrophy of deltoid/posterior cuff mm Tone: Normal   Observation No sign of acute infection, mild erythema along R upper arm, mild increased tissue temperature along R middle deltoid         Posture FHRS posture     AROM       AROM (Normal range in degrees) AROM 08/17/2022    Right Left  Shoulder      Flexion Deferred    Extension Deferred    Abduction Deferred    External Rotation Deferred    Internal Rotation Deferred    Hands Behind Head Deferred    Hands Behind Back Deferred           Elbow      Flexion WNL    Extension WNL    Pronation 50    Supination 60    (* = pain; Blank rows = not tested)     PROM     PROM (Normal range in degrees) AROM 08/17/2022    Right Left  Shoulder      Flexion 40    Extension      Abduction      External Rotation (arm at side) To neutral/0 deg    Internal Rotation (arm at side) 45          LE MMT: Manual muscle tests deferred to later date, post-op restrictions MMT (out of 5) Right 08/17/2022 Left 08/17/2022         Shoulder   Flexion      Extension      Abduction      External rotation      Internal rotation      Horizontal abduction      Horizontal adduction      Lower Trapezius      Rhomboids             Elbow  Flexion      Extension      Pronation      Supination             Wrist   Flexion      Extension      Radial deviation      Ulnar deviation      (* = pain; Blank rows = not tested)     Sensation Deferred   Reflexes Deferred     Palpation   Location LEFT  RIGHT           Subocciptials      Cervical paraspinals      Upper Trapezius      Levator Scapulae      Rhomboid Major/Minor      Sternoclavicular joint      Acromioclavicular joint   2  Coracoid process   1  Long head of biceps   1  Supraspinatus   2  Infraspinatus   1  Subscapularis      Teres Minor  Teres Major      Pectoralis Major      Pectoralis Minor      Anterior Deltoid   1  Lateral Deltoid   2  Posterior Deltoid      Latissimus Dorsi      Sternocleidomastoid      (Blank rows = not tested) Graded on 0-4 scale (0 = no pain, 1 = pain, 2 = pain with wincing/grimacing/flinching, 3 = pain with withdrawal, 4 = unwilling to allow palpation), (Blank rows = not tested)       TODAY'S TREATMENT      Manual Therapy - for R shoulder ROM and to prevent R shoulder stiffness   R shoulder PROM flexion, ER/IR Gentle STM to R anterior and middle deltoid, biceps, upper trapezius x 2 minutes  R shoulder PROM: flexion 140 deg     Therapeutic Exercise - for HEP establishment, discussion on appropriate exercise/activity modification, PT education     Scapular retractions, standing; 1x10, 5 sec Table slide PROM Flexion x15 Table slide PROM Abd x15 Pt low level manual isometric shoulder at 0 deg abd with PT- extension x10, IR/ER x10 (per protocol)  PATIENT EDUCATION: Reviewed post-op restrictions.     Cold pack (unbilled) - for anti-inflammatory and analgesic effect as needed for reduced pain and improved ability to participate in active PT intervention, along R shoulder in sitting, pillow propped under R forearm for upper limb support, x 5 minutes    *not today* Gripping; pink Theraputty; composite fist, performed x 2 minutes Wrist AROM; flexion/extension, RD/UD,  pronation/supination; reviewed for HEP    PATIENT EDUCATION:  Education details: Plan of care Person educated: Patient Education method: Explanation Education comprehension: verbalized understanding     HOME EXERCISE PROGRAM: Access Code: [email or web address] URL: [email or web address] Date: 08/17/2022 Prepared by: Valentina Gu   Exercises - Flexion-Extension Shoulder Pendulum with Table Support  - 2 x daily - 7 x weekly - 3 sets - 10 reps - Seated Scapular Retraction  - 2 x daily - 7 x weekly - 2 sets - 10 reps - 3sec hold     ASSESSMENT:   CLINICAL IMPRESSION: Patient is tolerating PROM up to current limits of post-operative protocol.  Will likely be ready for transition to AAROM/AROM of shoulder when allowed per post-op protocol.  Patient has ongoing post-operative impairments in R shoulder AROM and PROM, post-operative pain, edema, decreased strength. Patient will benefit from continued skilled PT to address above impairments and improve overall function.   REHAB POTENTIAL: Good   CLINICAL DECISION MAKING: Stable/uncomplicated   EVALUATION COMPLEXITY: Low     GOALS: Goals reviewed with patient? Yes   SHORT TERM GOALS: Target date: 09/14/2022   Pt will be independent with HEP to minimize adverse effects of immobilization and gently mobilize R shoulder to improve pain-free function at home and work. Baseline: 08/17/22: Baseline HEP initiated Goal status: INITIAL     LONG TERM GOALS: Target date: 12/07/22   Pt will increase FOTO to at least 56 to demonstrate significant improvement in function at home and work related to neck pain  Baseline: 08/17/22: 4 Goal status: INITIAL   2.  Pt will decrease worst shoulder pain by at least 3 points on the NPRS in order to demonstrate clinically significant reduction in shoulder pain. Baseline: 08/17/22: 5-6/10 at worst over last week; 09/23/22: 2/10 Goal status: INITIAL   3.   Pt will have R shoulder AROM at least within  10 degrees of contralateral upper extremity indicative of  improved ROM as needed for reaching, overhead work, self-care activities/dressing/grooming Baseline: 08/17/22: NO AROM presently, severely limited PROM in early post-op phase; 09/23/22: AROM not appropriate yet at this point post-operatively Goal status: INITIAL   4.   Pt will have R shoulder MMTs at 4+/5 or greater for all directions performed indicative of improved strength as needed for completion of daily lifting, reaching, carrying tasks (e.g. 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   Pt will perform unilateral farmer's carry with 15 lbs for 100 ft without reproduction of pain simulating carrying items in [employer] including carrying trash bag  Baseline: 08/17/22: Unable to perform lifting/carrying; 09/23/22: unable Goal status: INITIAL     PLAN: PT FREQUENCY: 1-2x/week   PT DURATION: 12 weeks   PLANNED INTERVENTIONS: Therapeutic exercises, Therapeutic activity, Neuromuscular re-education, Balance training, Gait training, Patient/Family education,  Joint mobilization, Electrical stimulation, Cryotherapy, Moist heat, and Manual therapy   PLAN FOR NEXT SESSION: R shoulder PROM up to 140 deg forward elevation only as tolerated, ER to neutral. Elbow and wrist/forearm AROM. Gripping as tolerated. Emphasis on protection of RTC repair in early-phase PT. Cryotherapy and modalities for pain prn.     Merdis Delay, PT, DPT, OCS  #[id number]   Pincus Badder, PT 09/23/2022, 6:13 PM    [facility] [address] [place], [place], [address] Phone: [phone]   Fax:  [phone]  Patient Details  Name: Shawn Atkins MRN: [id number] Date of Birth: June 02, 1956 Referring Provider:  [name], MD  Encounter Date:  09/23/2022

## 2022-09-28 ENCOUNTER — Encounter: Payer: Medicare Other | Admitting: Physical Therapy

## 2022-09-28 ENCOUNTER — Ambulatory Visit: Payer: Medicare Other | Attending: Orthopedic Surgery | Admitting: Physical Therapy

## 2022-09-28 DIAGNOSIS — M25511 Pain in right shoulder: Secondary | ICD-10-CM | POA: Diagnosis present

## 2022-09-28 DIAGNOSIS — M6281 Muscle weakness (generalized): Secondary | ICD-10-CM | POA: Insufficient documentation

## 2022-09-28 DIAGNOSIS — M25611 Stiffness of right shoulder, not elsewhere classified: Secondary | ICD-10-CM | POA: Diagnosis present

## 2022-09-28 NOTE — Therapy (Signed)
OUTPATIENT PHYSICAL THERAPY TREATMENT NOTE/Progress Note   Patient Name: Shawn Atkins MRN: [id number] DOB:16-Dec-1955, 66 y.o., male Today's Date: 09/28/2022  PCP: Lynnell Jude, MD REFERRING PROVIDER: [name], MD  END OF SESSION:        Past Medical History:  Diagnosis Date   BPH (benign prostatic hypertrophy)    Bronchitis    Chronic prostatitis    Complication of anesthesia    Felt like "couldn't breathe" after triple Hernia surgery   Diabetes mellitus without complication ([place])    Flank pain    GERD (gastroesophageal reflux disease)    h/o   Gross hematuria    History of kidney stones    HTN (hypertension)    pt states he takes lisinopril for kidney protection due to dm not htn   Inguinal hernia    Nocturia    OSA on CPAP    with 02 2 L   Overweight    PONV (postoperative nausea and vomiting)    only during kidney stone surgery   Spermatocele    Stricture of urethra    Vertigo    1 episode, 6-7 yrs ago   Past Surgical History:  Procedure Laterality Date   BICEPT TENODESIS Right 08/06/2022   Procedure: BICEPS TENODESIS;  Surgeon: [name], MD;  Location: [facility];  Service: Orthopedics;  Laterality: Right;   CATARACT EXTRACTION W/PHACO Left 07/22/2020   Procedure: CATARACT EXTRACTION PHACO AND INTRAOCULAR LENS PLACEMENT (I[facility]) LEFT DIABETIC 4.14  00:33.0;  Surgeon: Eulogio Bear, MD;  Location: [place];  Service: Ophthalmology;  Laterality: Left;  Diabetic - oral meds   CATARACT EXTRACTION W/PHACO Right 08/12/2020   Procedure: CATARACT EXTRACTION PHACO AND INTRAOCULAR LENS PLACEMENT ([place]) RIGHT DIABETIC;  Surgeon: Eulogio Bear, MD;  Location: [place];  Service: Ophthalmology;  Laterality: Right;  1.99 0:26.2   COLONOSCOPY WITH PROPOFOL N/A 11/20/2015   Procedure: COLONOSCOPY WITH PROPOFOL;  Surgeon: Christene Lye, MD;  Location: [facility];  Service: Endoscopy;  Laterality: N/A;   HERNIA REPAIR  Bilateral 7412   umbilical and bil inguinal/ Dr Marina Gravel   KIDNEY STONE SURGERY     KNEE ARTHROSCOPY Left    open lithotomy     SHOULDER ARTHROSCOPY WITH OPEN ROTATOR CUFF REPAIR AND DISTAL CLAVICLE ACROMINECTOMY Right 08/06/2022   Procedure: SHOULDER ARTHROSCOPY WITH OPEN ROTATOR CUFF REPAIR AND DISTAL CLAVICLE ACROMINECTOMY;  Surgeon: [name], MD;  Location: [facility];  Service: Orthopedics;  Laterality: Right;   Patient Active Problem List   Diagnosis Date Noted   History of rectal bleeding 06/01/2018   Left groin pain 03/18/2018   Sacroiliac joint pain 01/13/2017   Abdominal pain, left lower quadrant 08/23/2015   BPH with obstruction/lower urinary tract symptoms 08/23/2015   Biceps tendinitis 06/25/2015    REFERRING DIAG: M75.101 Unspecified rotator cuff tear or rupture of right shoulder, not specified as traumatic  THERAPY DIAG:  Acute pain of right shoulder  Stiffness of right shoulder, not elsewhere classified  Muscle weakness (generalized)  Rationale for Evaluation and Treatment Rehabilitation   PERTINENT HISTORY: Pt is a 66 year old male s/p R shoulder rotator cuff repair and distal clavicle acromionectomy with Dr. Mack Guise on 08/06/22. Patient reports that his surgeon informed him he did have full tear of rotator cuff in surgery. Patient reports 4 days of severe pain just out of surgery. Patient reports managing pain well with Tylenol/Ibuprofen well now. Pt followed up with [facility] for erythema and increased temperature as well as notable edema;  ruled out infection and DVT. Patient reports comorbid neck and R upper trap pain. Pt reports difficulty with sleep positioning due to comorbid neck pain.    Pain:  Pain Intensity: Present: 2-3/10, Best: 2/10, Worst: 5-6/10 Pain location: R shoulder along deltoid and biceps Pain Quality: steady pain, intense intermittently; sharp pain along ACJ that comes and goes Radiating: Yes , to anterior arm Numbness/Tingling:  No Aggravating factors: moving his R arm Relieving factors: Ibuprofen, Tylenol, resting arm/arm propped on armrest  History of prior shoulder or neck/shoulder injury, pain, surgery, or therapy: Yes, L shoulder with hx of cortisone injections, Hx of neck injury/PT for his neck Falls: Has patient fallen in last 6 months? No Dominant hand: left Imaging: Yes , MRI pre-operatively Prior level of function: Independent Occupational demands: Pt works part-time with [employer] (20 hrs/week), pt out of work now on medical leave Hobbies: [employer] (personal history of cancer, chills/fever, night sweats, nausea, vomiting, unrelenting pain): Negative   Precautions: None   Weight Bearing Restrictions: No   Living Environment Lives with: lives with their spouse Lives in: House/apartment     Patient Goals: Able to pick up weight with his arm, fish; able to play with baseball/bat with his grandson    PRECAUTIONS: No R shoulder AROM, no RUE lifting   DOS: 08/06/22, s/p R large RTC repair, R distal acromionectomy -8 weeks post-op 10/01/22   SUBJECTIVE:                                                                                                                                                                                      SUBJECTIVE STATEMENT:  Patient reports feeling "not too bad" at arrival to PT. He reports tingling and pain along radial aspect of forearm down to his R thumb. Patient reports pain along R paracervical region also. Pt is compliant with HEP. Pt describes pain into his forearm as "nuisance."    PAIN:  Are you having pain? Yes: NPRS scale: 2/10 pain at arrival  Pain location: pain into R forearm and radial aspect of wrist    OBJECTIVE: (objective measures completed at initial evaluation unless otherwise dated)     Patient Surveys  FOTO: 4, predicted outcome score of 56     Cognition Patient is oriented to person, place, and time.  Recent memory is intact.   Remote memory is intact.  Attention span and concentration are intact.  Expressive speech is intact.  Patient's fund of knowledge is within normal limits for educational level.  Gross Musculoskeletal Assessment Tremor: None Bulk: Atrophy of deltoid/posterior cuff mm Tone: Normal   Observation No sign of acute infection, mild erythema along R upper arm, mild increased tissue temperature along R middle deltoid         Posture FHRS posture     AROM       AROM (Normal range in degrees) AROM 08/17/2022    Right Left  Shoulder      Flexion Deferred    Extension Deferred    Abduction Deferred    External Rotation Deferred    Internal Rotation Deferred    Hands Behind Head Deferred    Hands Behind Back Deferred           Elbow      Flexion WNL    Extension WNL    Pronation 50    Supination 60    (* = pain; Blank rows = not tested)     PROM     PROM (Normal range in degrees) AROM 08/17/2022    Right Left  Shoulder      Flexion 40    Extension      Abduction      External Rotation (arm at side) To neutral/0 deg    Internal Rotation (arm at side) 45          LE MMT: Manual muscle tests deferred to later date, post-op restrictions MMT (out of 5) Right 08/17/2022 Left 08/17/2022         Shoulder   Flexion      Extension      Abduction      External rotation      Internal rotation      Horizontal abduction      Horizontal adduction      Lower Trapezius      Rhomboids             Elbow  Flexion      Extension      Pronation      Supination             Wrist  Flexion      Extension      Radial deviation      Ulnar deviation      (* = pain; Blank rows = not tested)     Sensation Deferred   Reflexes Deferred     Palpation   Location LEFT  RIGHT           Subocciptials      Cervical paraspinals      Upper Trapezius      Levator Scapulae      Rhomboid Major/Minor      Sternoclavicular joint       Acromioclavicular joint   2  Coracoid process   1  Long head of biceps   1  Supraspinatus   2  Infraspinatus   1  Subscapularis      Teres Minor      Teres Major      Pectoralis Major      Pectoralis Minor      Anterior Deltoid   1  Lateral Deltoid   2  Posterior Deltoid      Latissimus Dorsi      Sternocleidomastoid      (Blank rows = not tested) Graded on 0-4 scale (0 = no pain, 1 = pain, 2 = pain with wincing/grimacing/flinching, 3 = pain with withdrawal, 4 = unwilling to allow palpation), (Blank rows = not tested)  TODAY'S TREATMENT      Manual Therapy - for R shoulder ROM and to prevent R shoulder stiffness   R shoulder PROM flexion, ER/IR Gentle STM to R anterior and middle deltoid, biceps, upper trapezius x 2 minutes  R shoulder PROM: flexion 140 deg     Therapeutic Exercise - for HEP establishment, discussion on appropriate exercise/activity modification, shoulder ROM    Pendulum, 30x CW/CCW, forward/backward Scapular retractions, standing; reviewed    *next visit* Pt low level manual isometric shoulder at 0 deg abd with PT- extension x10, IR/ER x10 (per protocol)  PATIENT EDUCATION: Reviewed post-op restrictions.     Cold pack (unbilled) - for anti-inflammatory and analgesic effect as needed for reduced pain and improved ability to participate in active PT intervention, along R shoulder in sitting, pillow propped under R forearm for upper limb support, x 5 minutes    *not today* Table slide PROM Flexion x15 Table slide PROM Abd x15 Gripping; pink Theraputty; composite fist, performed x 2 minutes Wrist AROM; flexion/extension, RD/UD, pronation/supination; reviewed for HEP    PATIENT EDUCATION:  Education details: Plan of care Person educated: Patient Education method: Explanation Education comprehension: verbalized understanding     HOME EXERCISE PROGRAM: Access Code: [email or web address] URL: [email or web address] Date:  08/17/2022 Prepared by: Valentina Gu   Exercises - Flexion-Extension Shoulder Pendulum with Table Support  - 2 x daily - 7 x weekly - 3 sets - 10 reps - Seated Scapular Retraction  - 2 x daily - 7 x weekly - 2 sets - 10 reps - 3sec hold     ASSESSMENT:   CLINICAL IMPRESSION: Patient is tolerating PROM up to current limits of post-operative protocol.  Will likely be ready for transition to AAROM/AROM of shoulder when allowed per post-op protocol.  Patient has ongoing post-operative impairments in R shoulder AROM and PROM, post-operative pain, edema, decreased strength. Patient will benefit from continued skilled PT to address above impairments and improve overall function.   REHAB POTENTIAL: Good   CLINICAL DECISION MAKING: Stable/uncomplicated   EVALUATION COMPLEXITY: Low     GOALS: Goals reviewed with patient? Yes   SHORT TERM GOALS: Target date: 09/14/2022   Pt will be independent with HEP to minimize adverse effects of immobilization and gently mobilize R shoulder to improve pain-free function at home and work. Baseline: 08/17/22: Baseline HEP initiated Goal status: INITIAL     LONG TERM GOALS: Target date: 12/07/22   Pt will increase FOTO to at least 56 to demonstrate significant improvement in function at home and work related to neck pain  Baseline: 08/17/22: 4 Goal status: INITIAL   2.  Pt will decrease worst shoulder pain by at least 3 points on the NPRS in order to demonstrate clinically significant reduction in shoulder pain. Baseline: 08/17/22: 5-6/10 at worst over last week; 09/23/22: 2/10 Goal status: INITIAL   3.   Pt will have R shoulder AROM at least within 10 degrees of contralateral upper extremity indicative of improved ROM as needed for reaching, overhead work, self-care activities/dressing/grooming Baseline: 08/17/22: NO AROM presently, severely limited PROM in early post-op phase; 09/23/22: AROM not appropriate yet at this point post-operatively Goal  status: INITIAL   4.   Pt will have R shoulder MMTs at 4+/5 or greater for all directions performed indicative of improved strength as needed for completion of daily lifting, reaching, carrying tasks (e.g. 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   Pt will perform unilateral farmer's carry with 15 lbs for 100 ft without reproduction of pain simulating carrying items in [employer] including carrying trash bag  Baseline: 08/17/22: Unable to perform lifting/carrying; 09/23/22: unable Goal status: INITIAL     PLAN: PT FREQUENCY: 1-2x/week   PT DURATION: 12 weeks   PLANNED INTERVENTIONS: Therapeutic exercises, Therapeutic activity, Neuromuscular re-education, Balance training, Gait training, Patient/Family education,  Joint mobilization, Electrical stimulation, Cryotherapy, Moist heat, and Manual therapy   PLAN FOR NEXT SESSION: R shoulder PROM up to 140 deg forward elevation only as tolerated, ER to neutral. Elbow and wrist/forearm AROM. Gripping as tolerated. Emphasis on protection of RTC repair in early-phase PT. Cryotherapy and modalities for pain prn.    Valentina Gu, PT, DPT #[id number]  Eilleen Kempf, PT 09/28/2022, 1:19 PM    [place] [facility] [address] [place], [place], [address] Phone: [phone]   Fax:  [phone]

## 2022-09-29 ENCOUNTER — Encounter: Payer: Self-pay | Admitting: Physical Therapy

## 2022-09-30 ENCOUNTER — Encounter: Payer: Medicare Other | Admitting: Physical Therapy

## 2022-10-01 ENCOUNTER — Other Ambulatory Visit
Admission: RE | Admit: 2022-10-01 | Discharge: 2022-10-01 | Disposition: A | Discharge status: Home / Self Care | Payer: Medicare Other | Attending: Urology | Admitting: Urology

## 2022-10-01 ENCOUNTER — Ambulatory Visit: Payer: Medicare Other | Admitting: Physical Therapy

## 2022-10-01 VITALS — BP 139/73 | HR 90 | Temp 98.3°F

## 2022-10-01 DIAGNOSIS — N529 Male erectile dysfunction, unspecified: Secondary | ICD-10-CM | POA: Insufficient documentation

## 2022-10-01 DIAGNOSIS — N138 Other obstructive and reflux uropathy: Secondary | ICD-10-CM | POA: Insufficient documentation

## 2022-10-01 DIAGNOSIS — N401 Enlarged prostate with lower urinary tract symptoms: Secondary | ICD-10-CM | POA: Diagnosis present

## 2022-10-01 DIAGNOSIS — M25611 Stiffness of right shoulder, not elsewhere classified: Secondary | ICD-10-CM

## 2022-10-01 DIAGNOSIS — E349 Endocrine disorder, unspecified: Secondary | ICD-10-CM | POA: Diagnosis present

## 2022-10-01 DIAGNOSIS — M6281 Muscle weakness (generalized): Secondary | ICD-10-CM

## 2022-10-01 DIAGNOSIS — M25511 Pain in right shoulder: Secondary | ICD-10-CM

## 2022-10-01 LAB — HEMOGLOBIN AND HEMATOCRIT, BLOOD
HCT: 46 % (ref 39.0–52.0)
Hemoglobin: 15.3 g/dL (ref 13.0–17.0)

## 2022-10-01 NOTE — Therapy (Signed)
OUTPATIENT PHYSICAL THERAPY TREATMENT NOTE   Patient Name: Shawn Atkins MRN: [id number] DOB:02-12-1956, 66 y.o., male Today's Date: 10/01/2022  PCP: Lynnell Jude, MD REFERRING PROVIDER: [name], MD  END OF SESSION:   PT End of Session - 10/01/22 1109     Visit Number 12    Number of Visits 30    Date for PT Re-Evaluation 12/07/22    Authorization Type UHC Medicare, VL based on medical necessity    Authorization Time Period 08/18/23-10/26/22    Progress Note Due on Visit 10    PT Start Time 1033    PT Stop Time 1053    PT Time Calculation (min) 20 min    Activity Tolerance Patient limited by pain;Patient tolerated treatment well    Behavior During Therapy WFL for tasks assessed/performed                  Past Medical History:  Diagnosis Date   BPH (benign prostatic hypertrophy)    Bronchitis    Chronic prostatitis    Complication of anesthesia    Felt like "couldn't breathe" after triple Hernia surgery   Diabetes mellitus without complication (HCC)    Flank pain    GERD (gastroesophageal reflux disease)    h/o   Gross hematuria    History of kidney stones    HTN (hypertension)    pt states he takes lisinopril for kidney protection due to dm not htn   Inguinal hernia    Nocturia    OSA on CPAP    with 02 2 L   Overweight    PONV (postoperative nausea and vomiting)    only during kidney stone surgery   Spermatocele    Stricture of urethra    Vertigo    1 episode, 6-7 yrs ago   Past Surgical History:  Procedure Laterality Date   BICEPT TENODESIS Right 08/06/2022   Procedure: BICEPS TENODESIS;  Surgeon: [name], MD;  Location: [facility];  Service: Orthopedics;  Laterality: Right;   CATARACT EXTRACTION W/PHACO Left 07/22/2020   Procedure: CATARACT EXTRACTION PHACO AND INTRAOCULAR LENS PLACEMENT (IOC) LEFT DIABETIC 4.14  00:33.0;  Surgeon: Eulogio Bear, MD;  Location: [place];  Service: Ophthalmology;  Laterality: Left;   Diabetic - oral meds   CATARACT EXTRACTION W/PHACO Right 08/12/2020   Procedure: CATARACT EXTRACTION PHACO AND INTRAOCULAR LENS PLACEMENT ([place]) RIGHT DIABETIC;  Surgeon: Eulogio Bear, MD;  Location: [place];  Service: Ophthalmology;  Laterality: Right;  1.99 0:26.2   COLONOSCOPY WITH PROPOFOL N/A 11/20/2015   Procedure: COLONOSCOPY WITH PROPOFOL;  Surgeon: Christene Lye, MD;  Location: [facility];  Service: Endoscopy;  Laterality: N/A;   HERNIA REPAIR Bilateral 8144   umbilical and bil inguinal/ Dr Marina Gravel   KIDNEY STONE SURGERY     KNEE ARTHROSCOPY Left    open lithotomy     SHOULDER ARTHROSCOPY WITH OPEN ROTATOR CUFF REPAIR AND DISTAL CLAVICLE ACROMINECTOMY Right 08/06/2022   Procedure: SHOULDER ARTHROSCOPY WITH OPEN ROTATOR CUFF REPAIR AND DISTAL CLAVICLE ACROMINECTOMY;  Surgeon: [name], MD;  Location: [facility];  Service: Orthopedics;  Laterality: Right;   Patient Active Problem List   Diagnosis Date Noted   History of rectal bleeding 06/01/2018   Left groin pain 03/18/2018   Sacroiliac joint pain 01/13/2017   Abdominal pain, left lower quadrant 08/23/2015   BPH with obstruction/lower urinary tract symptoms 08/23/2015   Biceps tendinitis 06/25/2015    REFERRING DIAG: M75.101 Unspecified rotator cuff tear  or rupture of right shoulder, not specified as traumatic  THERAPY DIAG:  Acute pain of right shoulder  Stiffness of right shoulder, not elsewhere classified  Muscle weakness (generalized)  Rationale for Evaluation and Treatment Rehabilitation   PERTINENT HISTORY: Pt is a 66 year old male s/p R shoulder rotator cuff repair and distal clavicle acromionectomy with Dr. Mack Guise on 08/06/22. Patient reports that his surgeon informed him he did have full tear of rotator cuff in surgery. Patient reports 4 days of severe pain just out of surgery. Patient reports managing pain well with Tylenol/Ibuprofen well now. Pt followed up with [facility] for  erythema and increased temperature as well as notable edema; ruled out infection and DVT. Patient reports comorbid neck and R upper trap pain. Pt reports difficulty with sleep positioning due to comorbid neck pain.    Pain:  Pain Intensity: Present: 2-3/10, Best: 2/10, Worst: 5-6/10 Pain location: R shoulder along deltoid and biceps Pain Quality: steady pain, intense intermittently; sharp pain along ACJ that comes and goes Radiating: Yes , to anterior arm Numbness/Tingling: No Aggravating factors: moving his R arm Relieving factors: Ibuprofen, Tylenol, resting arm/arm propped on armrest  History of prior shoulder or neck/shoulder injury, pain, surgery, or therapy: Yes, L shoulder with hx of cortisone injections, Hx of neck injury/PT for his neck Falls: Has patient fallen in last 6 months? No Dominant hand: left Imaging: Yes , MRI pre-operatively Prior level of function: Independent Occupational demands: Pt works part-time with [employer] (20 hrs/week), pt out of work now on medical leave Hobbies: [employer] (personal history of cancer, chills/fever, night sweats, nausea, vomiting, unrelenting pain): Negative   Precautions: None   Weight Bearing Restrictions: No   Living Environment Lives with: lives with their spouse Lives in: House/apartment     Patient Goals: Able to pick up weight with his arm, fish; able to play with baseball/bat with his grandson    PRECAUTIONS: No R shoulder AROM, no RUE lifting   DOS: 08/06/22, s/p R large RTC repair, R distal acromionectomy -8 weeks post-op 10/01/22   SUBJECTIVE:                                                                                                                                                                                      SUBJECTIVE STATEMENT:  Patient pain has been relatively low, though relatively unchanged since earlier this week. He has region of numbness along R distal radius to radial styloid region. He reports  pain affecting R biceps and forearms intermittently. Upon entering gym, pt points out region of supraclavicular swelling to PT. He denies recent fever or known infection. Pt denies dizziness, headache, other regions  of swelling.    PAIN:  Are you having pain? Yes: NPRS scale: 2/10 pain at arrival  Pain location: pain into R forearm and radial aspect of wrist    OBJECTIVE: (objective measures completed at initial evaluation unless otherwise dated)     Patient Surveys  FOTO: 4, predicted outcome score of 56     Cognition Patient is oriented to person, place, and time.  Recent memory is intact.  Remote memory is intact.  Attention span and concentration are intact.  Expressive speech is intact.  Patient's fund of knowledge is within normal limits for educational level.                          Gross Musculoskeletal Assessment Tremor: None Bulk: Atrophy of deltoid/posterior cuff mm Tone: Normal   Observation No sign of acute infection, mild erythema along R upper arm, mild increased tissue temperature along R middle deltoid         Posture FHRS posture     AROM       AROM (Normal range in degrees) AROM 08/17/2022    Right Left  Shoulder      Flexion Deferred    Extension Deferred    Abduction Deferred    External Rotation Deferred    Internal Rotation Deferred    Hands Behind Head Deferred    Hands Behind Back Deferred           Elbow      Flexion WNL    Extension WNL    Pronation 50    Supination 60    (* = pain; Blank rows = not tested)     PROM     PROM (Normal range in degrees) AROM 08/17/2022    Right Left  Shoulder      Flexion 40    Extension      Abduction      External Rotation (arm at side) To neutral/0 deg    Internal Rotation (arm at side) 45          LE MMT: Manual muscle tests deferred to later date, post-op restrictions MMT (out of 5) Right 08/17/2022 Left 08/17/2022         Shoulder   Flexion      Extension       Abduction      External rotation      Internal rotation      Horizontal abduction      Horizontal adduction      Lower Trapezius      Rhomboids             Elbow  Flexion      Extension      Pronation      Supination             Wrist  Flexion      Extension      Radial deviation      Ulnar deviation      (* = pain; Blank rows = not tested)     Sensation Deferred   Reflexes Deferred     Palpation   Location LEFT  RIGHT           Subocciptials      Cervical paraspinals      Upper Trapezius      Levator Scapulae      Rhomboid Major/Minor      Sternoclavicular joint      Acromioclavicular joint  2  Coracoid process   1  Long head of biceps   1  Supraspinatus   2  Infraspinatus   1  Subscapularis      Teres Minor      Teres Major      Pectoralis Major      Pectoralis Minor      Anterior Deltoid   1  Lateral Deltoid   2  Posterior Deltoid      Latissimus Dorsi      Sternocleidomastoid      (Blank rows = not tested) Graded on 0-4 scale (0 = no pain, 1 = pain, 2 = pain with wincing/grimacing/flinching, 3 = pain with withdrawal, 4 = unwilling to allow palpation), (Blank rows = not tested)       TODAY'S TREATMENT    Observation Patient presents with R supraclavicular swelling. No erythema, ecchymosis. No venous distension. No upper limb color change or increased tissue temperature.   No pitting edema of RUE.    Today's Vitals   10/01/22 1042  BP: 139/73  Pulse: 90  Temp: 98.3 F (36.8 C)   There is no height or weight on file to calculate BMI.      Manual Therapy - for R shoulder ROM and to prevent R shoulder stiffness   *deferred* Manual cervical traction; x 3 minutes, 10 sec on, 5 sec off R shoulder PROM flexion, ER/IR x 10 minutes Gentle STM to R anterior and middle deltoid, biceps, upper trapezius, brachioradialis x 2 minutes       Therapeutic Exercise - for HEP establishment, discussion on appropriate exercise/activity  modification, shoulder ROM    PATIENT EDUCATION: discussed following up with PCP to rule out non-musculoskeletal issue (such as infection, malignancy, DVT) contributing to supraclavicular swelling. Discussed continued A/AAROM phase of post-op rehab pending ruling out any significant medical concerns.     *next visit* Pt low level manual isometric shoulder at 0 deg abd with PT- extension x10, IR/ER x10 (per protocol)   *not today* Pendulum, 30x CW/CCW, forward/backward Scapular retractions, standing; reviewed  Table slide PROM Flexion x15 Table slide PROM Abd x15 Gripping; pink Theraputty; composite fist, performed x 2 minutes Wrist AROM; flexion/extension, RD/UD, pronation/supination; reviewed for HEP      PATIENT EDUCATION:  Education details: Plan of care Person educated: Patient Education method: Explanation Education comprehension: verbalized understanding     HOME EXERCISE PROGRAM: Access Code: [email or web address] URL: [email or web address] Date: 08/17/2022 Prepared by: Valentina Gu   Exercises - Flexion-Extension Shoulder Pendulum with Table Support  - 2 x daily - 7 x weekly - 3 sets - 10 reps - Seated Scapular Retraction  - 2 x daily - 7 x weekly - 2 sets - 10 reps - 3sec hold     ASSESSMENT:   CLINICAL IMPRESSION: Patient presents with significant increase in supraclavicular swelling on R side that began abruptly about 2 days ago without notable pain in affected region. No recent infection known to pt. No fever or current signs of infection or DVT. Symptomatology is otherwise relatively unchanged compared to earlie this week aside from significant finding of supraclavicular swelling. Given pt demographics and abrupt nature of major swelling in this region, it is important to refer to MD to rule out non-musculoskeletal condition such as infection, malignancy, DVT, or other medical condition contributing to this. Patient has ongoing post-operative impairments  in R shoulder AROM and PROM, post-operative pain, edema, decreased strength. Patient will benefit from continued skilled PT to address above impairments and improve  overall function pending ruling out concerning medical cause contributing to this.   REHAB POTENTIAL: Good   CLINICAL DECISION MAKING: Stable/uncomplicated   EVALUATION COMPLEXITY: Low     GOALS: Goals reviewed with patient? Yes   SHORT TERM GOALS: Target date: 09/14/2022   Pt will be independent with HEP to minimize adverse effects of immobilization and gently mobilize R shoulder to improve pain-free function at home and work. Baseline: 08/17/22: Baseline HEP initiated Goal status: INITIAL     LONG TERM GOALS: Target date: 12/07/22   Pt will increase FOTO to at least 56 to demonstrate significant improvement in function at home and work related to neck pain  Baseline: 08/17/22: 4 Goal status: INITIAL   2.  Pt will decrease worst shoulder pain by at least 3 points on the NPRS in order to demonstrate clinically significant reduction in shoulder pain. Baseline: 08/17/22: 5-6/10 at worst over last week; 09/23/22: 2/10 Goal status: INITIAL   3.   Pt will have R shoulder AROM at least within 10 degrees of contralateral upper extremity indicative of improved ROM as needed for reaching, overhead work, self-care activities/dressing/grooming Baseline: 08/17/22: NO AROM presently, severely limited PROM in early post-op phase; 09/23/22: AROM not appropriate yet at this point post-operatively Goal status: INITIAL   4.   Pt will have R shoulder MMTs at 4+/5 or greater for all directions performed indicative of improved strength as needed for completion of daily lifting, reaching, carrying tasks (e.g. 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   Pt will perform unilateral  farmer's carry with 15 lbs for 100 ft without reproduction of pain simulating carrying items in [employer] including carrying trash bag  Baseline: 08/17/22: Unable to perform lifting/carrying; 09/23/22: unable Goal status: INITIAL     PLAN: PT FREQUENCY: 1-2x/week   PT DURATION: 12 weeks   PLANNED INTERVENTIONS: Therapeutic exercises, Therapeutic activity, Neuromuscular re-education, Balance training, Gait training, Patient/Family education,  Joint mobilization, Electrical stimulation, Cryotherapy, Moist heat, and Manual therapy   PLAN FOR NEXT SESSION:  Transition to AAROM/AROM phase at 8 weeks post-op 10/01/22. Continue rehab s/p R RCR per protocol pending pt ruling out any concerning medical cause for supraclavicular swelling.    Valentina Gu, PT, DPT #[id number]  Eilleen Kempf, PT 10/01/2022, 11:10 AM

## 2022-10-02 NOTE — Progress Notes (Unsigned)
10/05/22 11:09 AM   Shawn Atkins Aug 11, 1956 [id number]  Referring provider:  Lynnell Jude, MD [address] [place],  [place] [address]  Urological history: 1. BPH with LU TS -tadalafil 5 mg daily  2. Nocturia -Risk factors for nocturia: hypertension and BPH.     3. ED - contributing factors of age, BPH, testosterone deficiency and DM - tadalafil 5 mg daily    4. High risk hematuria - Former smoker - CTU 2014  left kidney demonstrates focal wedge-shaped indentation with near complete cortical loss within the lateral aspect of the mid pole of the kidney. A focal cyst is identified in this  area demonstrating Hounsfield units of 1 and 7 and measures approximately 1 cm - no cystoscopy for unknown reasons   5. Nephrolithiasis - contrast CT in 2016 notes 2 mm right mid kidney nonobstructive calculus. 1-2 mm right kidney lower pole nonobstructive calculus. Potential 1 mm left kidney lower pole nonobstructive calculus - no stones seen on contrast CT in 2019  6. Prostate cancer screening -PSA (06/2022) 0.14 -AUA guidelines (2023) recommend screening for prostate cancer in men ages 68 to 32 every 2 to 4 years -no family history of prostate cancer, breast cancer or ovarian cancer  -he will have yearly screening secondary to TRT  7. Testosterone deficiency -contributing factors of age, diabetes and obesity -testosterone level (09/2022) 492 -hemoglobin/hematocrit (09/2022) 15.3/46.0 -Testosterone cypionate 200/milliliters, 1 cc every 14 days  Chief Complaint  Patient presents with   Follow-up   Hypogonadism     HPI: Shawn Atkins is a 66 y.o.male who presents today follow up.     He had some further questions regarding the needles as to which one to drop the medication in which 1 to use for injections otherwise the injections are going well for him.  He feels that he has good energy and his testosterone level is 492.  PMH: Past Medical History:  Diagnosis Date   BPH  (benign prostatic hypertrophy)    Bronchitis    Chronic prostatitis    Complication of anesthesia    Felt like "couldn't breathe" after triple Hernia surgery   Diabetes mellitus without complication (HCC)    Flank pain    GERD (gastroesophageal reflux disease)    h/o   Gross hematuria    History of kidney stones    HTN (hypertension)    pt states he takes lisinopril for kidney protection due to dm not htn   Inguinal hernia    Nocturia    OSA on CPAP    with 02 2 L   Overweight    PONV (postoperative nausea and vomiting)    only during kidney stone surgery   Spermatocele    Stricture of urethra    Vertigo    1 episode, 6-7 yrs ago    Surgical History: Past Surgical History:  Procedure Laterality Date   BICEPT TENODESIS Right 08/06/2022   Procedure: BICEPS TENODESIS;  Surgeon: [name], MD;  Location: [facility];  Service: Orthopedics;  Laterality: Right;   CATARACT EXTRACTION W/PHACO Left 07/22/2020   Procedure: CATARACT EXTRACTION PHACO AND INTRAOCULAR LENS PLACEMENT (I[facility]) LEFT DIABETIC 4.14  00:33.0;  Surgeon: Eulogio Bear, MD;  Location: [facility];  Service: Ophthalmology;  Laterality: Left;  Diabetic - oral meds   CATARACT EXTRACTION W/PHACO Right 08/12/2020   Procedure: CATARACT EXTRACTION PHACO AND INTRAOCULAR LENS PLACEMENT ([place]) RIGHT DIABETIC;  Surgeon: Eulogio Bear, MD;  Location: [place];  Service: Ophthalmology;  Laterality: Right;  1.99 0:26.2   COLONOSCOPY WITH PROPOFOL N/A 11/20/2015   Procedure: COLONOSCOPY WITH PROPOFOL;  Surgeon: Christene Lye, MD;  Location: [facility];  Service: Endoscopy;  Laterality: N/A;   HERNIA REPAIR Bilateral 9509   umbilical and bil inguinal/ Dr Marina Gravel   KIDNEY STONE SURGERY     KNEE ARTHROSCOPY Left    open lithotomy     SHOULDER ARTHROSCOPY WITH OPEN ROTATOR CUFF REPAIR AND DISTAL CLAVICLE ACROMINECTOMY Right 08/06/2022   Procedure: SHOULDER ARTHROSCOPY WITH OPEN ROTATOR CUFF  REPAIR AND DISTAL CLAVICLE ACROMINECTOMY;  Surgeon: [name], MD;  Location: [facility];  Service: Orthopedics;  Laterality: Right;    Home Medications:  Allergies as of 10/05/2022       Reactions   Penicillins Anaphylaxis   Morphine And Related Nausea And Vomiting        Medication List        Accurate as of October 05, 2022 11:09 AM. If you have any questions, ask your nurse or doctor.          STOP taking these medications    ondansetron 4 MG tablet Commonly known as: Zofran Stopped by: Hikeem Andersson, PA-C   oxyCODONE 5 MG immediate release tablet Commonly known as: Oxy IR/ROXICODONE Stopped by: Gilma Bessette, PA-C       TAKE these medications    2-3CC SYRINGE 3 ML Misc 1 mg by Does not apply route every 14 (fourteen) days.   albuterol (2.5 MG/3ML) 0.083% nebulizer solution Commonly known as: PROVENTIL Take 2.5 mg by nebulization every 6 (six) hours as needed for wheezing or shortness of breath (Last used in the winter of 2022).   atorvastatin 20 MG tablet Commonly known as: LIPITOR Take 20 mg by mouth every evening.   BD Disp Needles 18G X 1-1/2" Misc Generic drug: NEEDLE (DISP) 18 G 1 mg by Does not apply route every 14 (fourteen) days.   BD Disp Needles 21G X 1-1/2" Misc Generic drug: NEEDLE (DISP) 21 G 1 mg by Does not apply route every 14 (fourteen) days.   BIOTIN PO Take 1 tablet by mouth daily.   glucosamine-chondroitin 500-400 MG tablet Take 1 tablet by mouth 2 (two) times daily.   lisinopril 10 MG tablet Commonly known as: ZESTRIL Take 10 mg by mouth every morning.   metFORMIN 500 MG 24 hr tablet Commonly known as: GLUCOPHAGE-XR Take 1,000 mg by mouth 2 (two) times daily with a meal.   multivitamin tablet Take 1 tablet by mouth daily.   tadalafil 5 MG tablet Commonly known as: CIALIS TAKE ONE TABLET BY MOUTH DAILY AS NEEDED FOR ERECTILE DYSFUNCTION What changed: See the new instructions.   testosterone cypionate 200  MG/ML injection Commonly known as: DEPOTESTOSTERONE CYPIONATE Inject 1 mL (200 mg total) into the muscle every 14 (fourteen) days.        Allergies:  Allergies  Allergen Reactions   Penicillins Anaphylaxis   Morphine And Related Nausea And Vomiting    Family History: Family History  Problem Relation Age of Onset   Benign prostatic hyperplasia Father    Kidney disease Neg Hx    Prostate cancer Neg Hx     Social History:  reports that he quit smoking about 38 years ago. His smoking use included cigarettes. He has a 15.00 pack-year smoking history. He has been exposed to tobacco smoke. His smokeless tobacco use includes snuff. He reports current alcohol use of about 1.0 standard drink of alcohol per week. He reports that  he does not use drugs.   Physical Exam: BP 129/75 (BP Location: Left Arm, Patient Position: Sitting, Cuff Size: Large)   Pulse 83   Ht '5\' 9"'$  (1.753 m)   Wt 205 lb 6.4 oz (93.2 kg)   BMI 30.33 kg/m   Constitutional:  Well nourished. Alert and oriented, No acute distress. HEENT: [place] AT, moist mucus membranes.  Trachea midline Cardiovascular: No clubbing, cyanosis, or edema. Respiratory: Normal respiratory effort, no increased work of breathing. Neurologic: Grossly intact, no focal deficits, moving all 4 extremities. Psychiatric: Normal mood and affect.   Laboratory Data: See EPIC and Urological History  I have reviewed the labs.   Pertinent Imaging N/A  Assessment & Plan:    1. Testosterone deficiency  -testosterone levels are therapeutic -H & H WNL -Continue testosterone cypionate 200 mg/milliliters, 1 cc every 14 days    Return in about 3 months (around 01/04/2023) for PSA, testosterone (one week after injection) H & H -12/09/2022.  Blen Ransome, [employer] [address] [place], [place] [address] [phone]

## 2022-10-03 LAB — TESTOSTERONE: Testosterone: 492 ng/dL (ref 264–916)

## 2022-10-05 ENCOUNTER — Ambulatory Visit: Payer: Medicare Other | Admitting: Physical Therapy

## 2022-10-05 ENCOUNTER — Encounter: Payer: Self-pay | Admitting: Urology

## 2022-10-05 ENCOUNTER — Ambulatory Visit: Payer: Medicare Other | Admitting: Urology

## 2022-10-05 VITALS — BP 129/75 | HR 83 | Ht 69.0 in | Wt 205.4 lb

## 2022-10-05 DIAGNOSIS — M25511 Pain in right shoulder: Secondary | ICD-10-CM | POA: Diagnosis not present

## 2022-10-05 DIAGNOSIS — E291 Testicular hypofunction: Secondary | ICD-10-CM

## 2022-10-05 DIAGNOSIS — N401 Enlarged prostate with lower urinary tract symptoms: Secondary | ICD-10-CM

## 2022-10-05 DIAGNOSIS — M25611 Stiffness of right shoulder, not elsewhere classified: Secondary | ICD-10-CM

## 2022-10-05 DIAGNOSIS — E349 Endocrine disorder, unspecified: Secondary | ICD-10-CM

## 2022-10-05 DIAGNOSIS — M6281 Muscle weakness (generalized): Secondary | ICD-10-CM

## 2022-10-05 DIAGNOSIS — N529 Male erectile dysfunction, unspecified: Secondary | ICD-10-CM

## 2022-10-05 MED ORDER — TESTOSTERONE CYPIONATE 200 MG/ML IM SOLN
200.0000 mg | Freq: Once | INTRAMUSCULAR | Status: AC
  Administered 2022-10-05: 200 mg via INTRAMUSCULAR

## 2022-10-05 MED ORDER — TESTOSTERONE CYPIONATE 200 MG/ML IM SOLN: 200.0000 mg | INTRAMUSCULAR | 0 refills | Status: DC

## 2022-10-05 NOTE — Addendum Note (Signed)
Addended by: Donalee Citrin on: 10/05/2022 11:21 AM   Modules accepted: Orders

## 2022-10-05 NOTE — Therapy (Signed)
OUTPATIENT PHYSICAL THERAPY TREATMENT NOTE   Patient Name: Shawn Atkins MRN: [id number] DOB:[birth year], 66 y.o., male Today's Date: 10/05/2022  PCP: Lynnell Jude, MD REFERRING PROVIDER: [name], MD  END OF SESSION:   PT End of Session - 10/05/22 1352     Visit Number 13    Number of Visits 30    Date for PT Re-Evaluation 12/07/22    Authorization Type [employer] Medicare, VL based on medical necessity    Authorization Time Period 08/18/23-10/26/22    Progress Note Due on Visit 10    PT Start Time 1330    PT Stop Time 1413    PT Time Calculation (min) 43 min    Activity Tolerance Patient limited by pain;Patient tolerated treatment well    Behavior During Therapy WFL for tasks assessed/performed                  Past Medical History:  Diagnosis Date   BPH (benign prostatic hypertrophy)    Bronchitis    Chronic prostatitis    Complication of anesthesia    Felt like "couldn't breathe" after triple Hernia surgery   Diabetes mellitus without complication (HCC)    Flank pain    GERD (gastroesophageal reflux disease)    h/o   Gross hematuria    History of kidney stones    HTN (hypertension)    pt states he takes lisinopril for kidney protection due to dm not htn   Inguinal hernia    Nocturia    OSA on CPAP    with 02 2 L   Overweight    PONV (postoperative nausea and vomiting)    only during kidney stone surgery   Spermatocele    Stricture of urethra    Vertigo    1 episode, 6-7 yrs ago   Past Surgical History:  Procedure Laterality Date   BICEPT TENODESIS Right 08/06/2022   Procedure: BICEPS TENODESIS;  Surgeon: [name], MD;  Location: [facility];  Service: Orthopedics;  Laterality: Right;   CATARACT EXTRACTION W/PHACO Left 07/22/2020   Procedure: CATARACT EXTRACTION PHACO AND INTRAOCULAR LENS PLACEMENT ([facility]) LEFT DIABETIC 4.14  00:33.0;  Surgeon: Eulogio Bear, MD;  Location: [place];  Service: Ophthalmology;  Laterality: Left;   Diabetic - oral meds   CATARACT EXTRACTION W/PHACO Right 08/12/2020   Procedure: CATARACT EXTRACTION PHACO AND INTRAOCULAR LENS PLACEMENT ([place]) RIGHT DIABETIC;  Surgeon: Eulogio Bear, MD;  Location: [place];  Service: Ophthalmology;  Laterality: Right;  1.99 0:26.2   COLONOSCOPY WITH PROPOFOL N/A 11/20/2015   Procedure: COLONOSCOPY WITH PROPOFOL;  Surgeon: Christene Lye, MD;  Location: [facility];  Service: Endoscopy;  Laterality: N/A;   HERNIA REPAIR Bilateral 2542   umbilical and bil inguinal/ Dr Marina Gravel   KIDNEY STONE SURGERY     KNEE ARTHROSCOPY Left    open lithotomy     SHOULDER ARTHROSCOPY WITH OPEN ROTATOR CUFF REPAIR AND DISTAL CLAVICLE ACROMINECTOMY Right 08/06/2022   Procedure: SHOULDER ARTHROSCOPY WITH OPEN ROTATOR CUFF REPAIR AND DISTAL CLAVICLE ACROMINECTOMY;  Surgeon: [name], MD;  Location: [facility];  Service: Orthopedics;  Laterality: Right;   Patient Active Problem List   Diagnosis Date Noted   History of rectal bleeding 06/01/2018   Left groin pain 03/18/2018   Sacroiliac joint pain 01/13/2017   Abdominal pain, left lower quadrant 08/23/2015   BPH with obstruction/lower urinary tract symptoms 08/23/2015   Biceps tendinitis 06/25/2015    REFERRING DIAG: M75.101 Unspecified rotator cuff tear  or rupture of right shoulder, not specified as traumatic  THERAPY DIAG:  Acute pain of right shoulder  Stiffness of right shoulder, not elsewhere classified  Muscle weakness (generalized)  Rationale for Evaluation and Treatment Rehabilitation   PERTINENT HISTORY: Pt is a 66 year old male s/p R shoulder rotator cuff repair and distal clavicle acromionectomy with Dr. Mack Guise on 08/06/22. Patient reports that his surgeon informed him he did have full tear of rotator cuff in surgery. Patient reports 4 days of severe pain just out of surgery. Patient reports managing pain well with Tylenol/Ibuprofen well now. Pt followed up with [facility] for  erythema and increased temperature as well as notable edema; ruled out infection and DVT. Patient reports comorbid neck and R upper trap pain. Pt reports difficulty with sleep positioning due to comorbid neck pain.    Pain:  Pain Intensity: Present: 2-3/10, Best: 2/10, Worst: 5-6/10 Pain location: R shoulder along deltoid and biceps Pain Quality: steady pain, intense intermittently; sharp pain along ACJ that comes and goes Radiating: Yes , to anterior arm Numbness/Tingling: No Aggravating factors: moving his R arm Relieving factors: Ibuprofen, Tylenol, resting arm/arm propped on armrest  History of prior shoulder or neck/shoulder injury, pain, surgery, or therapy: Yes, L shoulder with hx of cortisone injections, Hx of neck injury/PT for his neck Falls: Has patient fallen in last 6 months? No Dominant hand: left Imaging: Yes , MRI pre-operatively Prior level of function: Independent Occupational demands: Pt works part-time with [employer] (20 hrs/week), pt out of work now on medical leave Hobbies: [employer] (personal history of cancer, chills/fever, night sweats, nausea, vomiting, unrelenting pain): Negative   Precautions: None   Weight Bearing Restrictions: No   Living Environment Lives with: lives with their spouse Lives in: House/apartment     Patient Goals: Able to pick up weight with his arm, fish; able to play with baseball/bat with his grandson    PRECAUTIONS: No R shoulder AROM, no RUE lifting   DOS: 08/06/22, s/p R large RTC repair, R distal acromionectomy -8 weeks post-op 10/01/22   SUBJECTIVE:                                                                                                                                                                                      SUBJECTIVE STATEMENT:  Patient followed up with PA at [facility] and was informed this could be superficial swelling. Patient was informed that he should seek immediate attention with any sudden  increase in swelling. Patient reports low-level pain at this time, but he does have diffuse pain pattern affecting R biceps, forearm, and R radial styloid region.    PAIN:  Are you  having pain? Yes: NPRS scale: 2/10 pain at arrival  Pain location: pain into R forearm and radial aspect of wrist    OBJECTIVE: (objective measures completed at initial evaluation unless otherwise dated)     Patient Surveys  FOTO: 4, predicted outcome score of 56     Cognition Patient is oriented to person, place, and time.  Recent memory is intact.  Remote memory is intact.  Attention span and concentration are intact.  Expressive speech is intact.  Patient's fund of knowledge is within normal limits for educational level.                          Gross Musculoskeletal Assessment Tremor: None Bulk: Atrophy of deltoid/posterior cuff mm Tone: Normal   Observation No sign of acute infection, mild erythema along R upper arm, mild increased tissue temperature along R middle deltoid         Posture FHRS posture     AROM       AROM (Normal range in degrees) AROM 08/17/2022    Right Left  Shoulder      Flexion Deferred    Extension Deferred    Abduction Deferred    External Rotation Deferred    Internal Rotation Deferred    Hands Behind Head Deferred    Hands Behind Back Deferred           Elbow      Flexion WNL    Extension WNL    Pronation 50    Supination 60    (* = pain; Blank rows = not tested)     PROM     PROM (Normal range in degrees) AROM 08/17/2022    Right Left  Shoulder      Flexion 40    Extension      Abduction      External Rotation (arm at side) To neutral/0 deg    Internal Rotation (arm at side) 45          LE MMT: Manual muscle tests deferred to later date, post-op restrictions MMT (out of 5) Right 08/17/2022 Left 08/17/2022         Shoulder   Flexion      Extension      Abduction      External rotation      Internal rotation       Horizontal abduction      Horizontal adduction      Lower Trapezius      Rhomboids             Elbow  Flexion      Extension      Pronation      Supination             Wrist  Flexion      Extension      Radial deviation      Ulnar deviation      (* = pain; Blank rows = not tested)     Sensation Deferred   Reflexes Deferred     Palpation   Location LEFT  RIGHT           Subocciptials      Cervical paraspinals      Upper Trapezius      Levator Scapulae      Rhomboid Major/Minor      Sternoclavicular joint      Acromioclavicular joint   2  Coracoid process   1  Long  head of biceps   1  Supraspinatus   2  Infraspinatus   1  Subscapularis      Teres Minor      Teres Major      Pectoralis Major      Pectoralis Minor      Anterior Deltoid   1  Lateral Deltoid   2  Posterior Deltoid      Latissimus Dorsi      Sternocleidomastoid      (Blank rows = not tested) Graded on 0-4 scale (0 = no pain, 1 = pain, 2 = pain with wincing/grimacing/flinching, 3 = pain with withdrawal, 4 = unwilling to allow palpation), (Blank rows = not tested)       TODAY'S TREATMENT    Manual Therapy - for R shoulder ROM and to prevent R shoulder stiffness   R shoulder PROM flexion, ER/IR x 10 minutes Gentle STM to R anterior and middle deltoid, biceps, upper trapezius, brachioradialis x 2 minutes  -Application of Biofreeze to R UT, R anterior/middle deltoid, R biceps, R brachioradialis for topical analgesic effect*  R shoulder AAROM: flexion 140 deg, ER 45 deg  Rhythmic stabilization; arm at 100 deg forward elevation; 2x1 minute (light perturbations)     Therapeutic Exercise - for HEP establishment, discussion on appropriate exercise/activity modification, shoulder ROM  Wand flexion AAROM; 2x10 Wand ER AAROM; 2x10 Pulleys; forward elevation; x 2 minutes   *next visit* Shoulder isometrics; flexion, ER, IR; x15 each, 5 sec hold (towel at wall)   PATIENT EDUCATION: Reviewed  post-op restrictions. Dicussed possible contribution of cervical spine referred pain to current diffuse pain pattern as well as potential referred pain related to rotator-cuff related shoulder pain. Discussed ongoing wrist AROM and wrist stretching that could be used at home with shoulder in neutral position for tight wrist flexors/extensors. Discussed postural modification to limit flare-up of this condition.       *not today* Table slide PROM Flexion x15 Table slide PROM Abd x15 Gripping; pink Theraputty; composite fist, performed x 2 minutes Wrist AROM; flexion/extension, RD/UD, pronation/supination; reviewed for HEP  Cold pack (unbilled) - for anti-inflammatory and analgesic effect as needed for reduced pain and improved ability to participate in active PT intervention, along R shoulder in sitting, pillow propped under R forearm for upper limb support, x 5 minutes     PATIENT EDUCATION:  Education details: Plan of care Person educated: Patient Education method: Explanation Education comprehension: verbalized understanding     HOME EXERCISE PROGRAM: Access Code: [email or web address] URL: [email or web address] Date: 08/17/2022 Prepared by: Valentina Gu   Exercises - Flexion-Extension Shoulder Pendulum with Table Support  - 2 x daily - 7 x weekly - 3 sets - 10 reps - Seated Scapular Retraction  - 2 x daily - 7 x weekly - 2 sets - 10 reps - 3sec hold     ASSESSMENT:   CLINICAL IMPRESSION: Patient is tolerating PROM up to current limits of post-operative protocol.  Will likely be ready for transition to AAROM/AROM of shoulder when allowed per post-op protocol. Pt has high pain relative to his current post-operative time with numbness along distal radius/radial styloid region and pain into R biceps/forearm. NPRS is mild, but pt could have pain stemming from C-spine referral as well. Discussed activity modification and simple wrist stretches pt could use at home for distal  forearm pain. Pt is nearing end of PROM phase of rehab and may initiate gentle AAROM next visit within patient tolerance. Patient has ongoing post-operative impairments in  R shoulder AROM and PROM, post-operative pain, edema, decreased strength. Patient will benefit from continued skilled PT to address above impairments and improve overall function.   REHAB POTENTIAL: Good   CLINICAL DECISION MAKING: Stable/uncomplicated   EVALUATION COMPLEXITY: Low     GOALS: Goals reviewed with patient? Yes   SHORT TERM GOALS: Target date: 09/14/2022   Pt will be independent with HEP to minimize adverse effects of immobilization and gently mobilize R shoulder to improve pain-free function at home and work. Baseline: 08/17/22: Baseline HEP initiated Goal status: INITIAL     LONG TERM GOALS: Target date: 12/07/22   Pt will increase FOTO to at least 56 to demonstrate significant improvement in function at home and work related to neck pain  Baseline: 08/17/22: 4 Goal status: INITIAL   2.  Pt will decrease worst shoulder pain by at least 3 points on the NPRS in order to demonstrate clinically significant reduction in shoulder pain. Baseline: 08/17/22: 5-6/10 at worst over last week; 09/23/22: 2/10 Goal status: INITIAL   3.   Pt will have R shoulder AROM at least within 10 degrees of contralateral upper extremity indicative of improved ROM as needed for reaching, overhead work, self-care activities/dressing/grooming Baseline: 08/17/22: NO AROM presently, severely limited PROM in early post-op phase; 09/23/22: AROM not appropriate yet at this point post-operatively Goal status: INITIAL   4.   Pt will have R shoulder MMTs at 4+/5 or greater for all directions performed indicative of improved strength as needed for completion of daily lifting, reaching, carrying tasks (e.g. 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   Pt will perform unilateral farmer's carry with 15 lbs for 100 ft without reproduction of pain simulating carrying items in [employer] including carrying trash bag  Baseline: 08/17/22: Unable to perform lifting/carrying; 09/23/22: unable Goal status: INITIAL     PLAN: PT FREQUENCY: 1-2x/week   PT DURATION: 12 weeks   PLANNED INTERVENTIONS: Therapeutic exercises, Therapeutic activity, Neuromuscular re-education, Balance training, Gait training, Patient/Family education,  Joint mobilization, Electrical stimulation, Cryotherapy, Moist heat, and Manual therapy   PLAN FOR NEXT SESSION: R shoulder PROM up to 140 deg forward elevation only as tolerated, ER to neutral. Elbow and wrist/forearm AROM. Gripping as tolerated. Emphasis on protection of RTC repair in early-phase PT. Cryotherapy and modalities for pain prn. Transition to AAROM/AROM phase at 8 weeks post-op 10/01/22   Valentina Gu, PT, DPT #[id number]  Eilleen Kempf, PT 10/05/2022, 1:55 PM

## 2022-10-05 NOTE — Progress Notes (Signed)
Testosterone IM Injection  Due to Hypogonadism patient is present today for a Testosterone Injection.  Medication: Testosterone Cypionate Dose: 46m/'200mg'$   Location: right upper outer buttocks Lot: [id number]:03/2025  Patient tolerated well, no complications were noted  Performed by: OGordy Clement CMA  Follow up: RTC as scheduled.

## 2022-10-07 ENCOUNTER — Encounter: Payer: Self-pay | Admitting: Physical Therapy

## 2022-10-07 ENCOUNTER — Ambulatory Visit: Payer: Medicare Other | Admitting: Physical Therapy

## 2022-10-07 DIAGNOSIS — M25511 Pain in right shoulder: Secondary | ICD-10-CM

## 2022-10-07 DIAGNOSIS — M6281 Muscle weakness (generalized): Secondary | ICD-10-CM

## 2022-10-07 DIAGNOSIS — M25611 Stiffness of right shoulder, not elsewhere classified: Secondary | ICD-10-CM

## 2022-10-07 NOTE — Therapy (Signed)
OUTPATIENT PHYSICAL THERAPY TREATMENT NOTE   Patient Name: Shawn Atkins MRN: [id number] DOB:Jun 12, 1956, 66 y.o., male Today's Date: 10/07/2022  PCP: Lynnell Jude, MD REFERRING PROVIDER: [name], MD  END OF SESSION:   PT End of Session - 10/07/22 0932     Visit Number 14    Number of Visits 30    Date for PT Re-Evaluation 12/07/22    Authorization Type [employer] Medicare, VL based on medical necessity    Authorization Time Period 08/18/23-10/26/22    Progress Note Due on Visit 10    PT Start Time 0932    PT Stop Time 1015    PT Time Calculation (min) 43 min    Activity Tolerance Patient limited by pain;Patient tolerated treatment well    Behavior During Therapy WFL for tasks assessed/performed              Past Medical History:  Diagnosis Date   BPH (benign prostatic hypertrophy)    Bronchitis    Chronic prostatitis    Complication of anesthesia    Felt like "couldn't breathe" after triple Hernia surgery   Diabetes mellitus without complication (HCC)    Flank pain    GERD (gastroesophageal reflux disease)    h/o   Gross hematuria    History of kidney stones    HTN (hypertension)    pt states he takes lisinopril for kidney protection due to dm not htn   Inguinal hernia    Nocturia    OSA on CPAP    with 02 2 L   Overweight    PONV (postoperative nausea and vomiting)    only during kidney stone surgery   Spermatocele    Stricture of urethra    Vertigo    1 episode, 6-7 yrs ago   Past Surgical History:  Procedure Laterality Date   BICEPT TENODESIS Right 08/06/2022   Procedure: BICEPS TENODESIS;  Surgeon: [name], MD;  Location: [facility];  Service: Orthopedics;  Laterality: Right;   CATARACT EXTRACTION W/PHACO Left 07/22/2020   Procedure: CATARACT EXTRACTION PHACO AND INTRAOCULAR LENS PLACEMENT ([facility]) LEFT DIABETIC 4.14  00:33.0;  Surgeon: Eulogio Bear, MD;  Location: [place];  Service: Ophthalmology;  Laterality: Left;  Diabetic  - oral meds   CATARACT EXTRACTION W/PHACO Right 08/12/2020   Procedure: CATARACT EXTRACTION PHACO AND INTRAOCULAR LENS PLACEMENT ([place]) RIGHT DIABETIC;  Surgeon: Eulogio Bear, MD;  Location: [place];  Service: Ophthalmology;  Laterality: Right;  1.99 0:26.2   COLONOSCOPY WITH PROPOFOL N/A 11/20/2015   Procedure: COLONOSCOPY WITH PROPOFOL;  Surgeon: Christene Lye, MD;  Location: [facility];  Service: Endoscopy;  Laterality: N/A;   HERNIA REPAIR Bilateral 9326   umbilical and bil inguinal/ Dr Marina Gravel   KIDNEY STONE SURGERY     KNEE ARTHROSCOPY Left    open lithotomy     SHOULDER ARTHROSCOPY WITH OPEN ROTATOR CUFF REPAIR AND DISTAL CLAVICLE ACROMINECTOMY Right 08/06/2022   Procedure: SHOULDER ARTHROSCOPY WITH OPEN ROTATOR CUFF REPAIR AND DISTAL CLAVICLE ACROMINECTOMY;  Surgeon: [name], MD;  Location: [facility];  Service: Orthopedics;  Laterality: Right;   Patient Active Problem List   Diagnosis Date Noted   History of rectal bleeding 06/01/2018   Left groin pain 03/18/2018   Sacroiliac joint pain 01/13/2017   Abdominal pain, left lower quadrant 08/23/2015   BPH with obstruction/lower urinary tract symptoms 08/23/2015   Biceps tendinitis 06/25/2015    REFERRING DIAG: M75.101 Unspecified rotator cuff tear or rupture of right  shoulder, not specified as traumatic  THERAPY DIAG:  Acute pain of right shoulder  Stiffness of right shoulder, not elsewhere classified  Muscle weakness (generalized)  Rationale for Evaluation and Treatment Rehabilitation   PERTINENT HISTORY: Pt is a 66 year old male s/p R shoulder rotator cuff repair and distal clavicle acromionectomy with Dr. Mack Guise on 08/06/22. Patient reports that his surgeon informed him he did have full tear of rotator cuff in surgery. Patient reports 4 days of severe pain just out of surgery. Patient reports managing pain well with Tylenol/Ibuprofen well now. Pt followed up with [facility] for erythema  and increased temperature as well as notable edema; ruled out infection and DVT. Patient reports comorbid neck and R upper trap pain. Pt reports difficulty with sleep positioning due to comorbid neck pain.    Pain:  Pain Intensity: Present: 2-3/10, Best: 2/10, Worst: 5-6/10 Pain location: R shoulder along deltoid and biceps Pain Quality: steady pain, intense intermittently; sharp pain along ACJ that comes and goes Radiating: Yes , to anterior arm Numbness/Tingling: No Aggravating factors: moving his R arm Relieving factors: Ibuprofen, Tylenol, resting arm/arm propped on armrest  History of prior shoulder or neck/shoulder injury, pain, surgery, or therapy: Yes, L shoulder with hx of cortisone injections, Hx of neck injury/PT for his neck Falls: Has patient fallen in last 6 months? No Dominant hand: left Imaging: Yes , MRI pre-operatively Prior level of function: Independent Occupational demands: Pt works part-time with [employer] (20 hrs/week), pt out of work now on medical leave Hobbies: [employer] (personal history of cancer, chills/fever, night sweats, nausea, vomiting, unrelenting pain): Negative   Precautions: None   Weight Bearing Restrictions: No   Living Environment Lives with: lives with their spouse Lives in: House/apartment     Patient Goals: Able to pick up weight with his arm, fish; able to play with baseball/bat with his grandson    PRECAUTIONS: No R shoulder AROM, no RUE lifting   DOS: 08/06/22, s/p R large RTC repair, R distal acromionectomy -8 weeks post-op 10/01/22   SUBJECTIVE:                                                                                                                                                                                      SUBJECTIVE STATEMENT:  Patient reports ongoing burning affecting R radial styloid/distal radius region. Patient reports minimal pain affecting R shoulder/arm today. Patient reports compliance with HEP. Pt is  following up with surgeon's office at 11:00 today.    PAIN:  Are you having pain? Yes: NPRS scale: 1-2/10 pain at arrival  Pain location: pain into R forearm and radial aspect of wrist  OBJECTIVE: (objective measures completed at initial evaluation unless otherwise dated)     Patient Surveys  FOTO: 4, predicted outcome score of 27     Cognition Patient is oriented to person, place, and time.  Recent memory is intact.  Remote memory is intact.  Attention span and concentration are intact.  Expressive speech is intact.  Patient's fund of knowledge is within normal limits for educational level.                          Gross Musculoskeletal Assessment Tremor: None Bulk: Atrophy of deltoid/posterior cuff mm Tone: Normal   Observation No sign of acute infection, mild erythema along R upper arm, mild increased tissue temperature along R middle deltoid         Posture FHRS posture     AROM       AROM (Normal range in degrees) AROM 08/17/2022    Right Left  Shoulder      Flexion Deferred    Extension Deferred    Abduction Deferred    External Rotation Deferred    Internal Rotation Deferred    Hands Behind Head Deferred    Hands Behind Back Deferred           Elbow      Flexion WNL    Extension WNL    Pronation 50    Supination 60    (* = pain; Blank rows = not tested)     PROM     PROM (Normal range in degrees) AROM 08/17/2022    Right Left  Shoulder      Flexion 40    Extension      Abduction      External Rotation (arm at side) To neutral/0 deg    Internal Rotation (arm at side) 45          LE MMT: Manual muscle tests deferred to later date, post-op restrictions MMT (out of 5) Right 08/17/2022 Left 08/17/2022         Shoulder   Flexion      Extension      Abduction      External rotation      Internal rotation      Horizontal abduction      Horizontal adduction      Lower Trapezius      Rhomboids             Elbow  Flexion       Extension      Pronation      Supination             Wrist  Flexion      Extension      Radial deviation      Ulnar deviation      (* = pain; Blank rows = not tested)     Sensation Deferred   Reflexes Deferred     Palpation   Location LEFT  RIGHT           Subocciptials      Cervical paraspinals      Upper Trapezius      Levator Scapulae      Rhomboid Major/Minor      Sternoclavicular joint      Acromioclavicular joint   2  Coracoid process   1  Long head of biceps   1  Supraspinatus   2  Infraspinatus   1  Subscapularis      Teres  Minor      Teres Major      Pectoralis Major      Pectoralis Minor      Anterior Deltoid   1  Lateral Deltoid   2  Posterior Deltoid      Latissimus Dorsi      Sternocleidomastoid      (Blank rows = not tested) Graded on 0-4 scale (0 = no pain, 1 = pain, 2 = pain with wincing/grimacing/flinching, 3 = pain with withdrawal, 4 = unwilling to allow palpation), (Blank rows = not tested)       TODAY'S TREATMENT     (+) Finkelstein's test  Tenderness to palpation along R brachioradialis, R radial styloid, R CMC joint line, R tunnel 1 tendons proximal to insertions    Manual Therapy - for R shoulder ROM and to prevent R shoulder stiffness   R shoulder PROM flexion, abduction, ER/IR x 10 minutes Gentle STM to R anterior and middle deltoid, biceps, upper trapezius, brachioradialis x 2 minutes  Rhythmic stabilization; arm at 100 deg forward elevation; 2x1 minute (moderate perturbations)     Therapeutic Exercise - for HEP establishment, discussion on appropriate exercise/activity modification, shoulder ROM  Wand flexion AAROM; 2x10, with bent elbow to reduce lever arm Sidelying ER AROM; 2x10, verbal and tactile cueing for technique Pulleys; forward elevation; x 2 minutes Shoulder isometrics; flexion, ER, IR; x15 each, 5 sec hold (towel at wall)   PATIENT EDUCATION: Reviewed post-op restrictions. Dicussed possible contribution  of cervical spine referred pain to current diffuse pain pattern as well as potential DeQuervain's tenosynovitis/tunnel 1 involvement.      *not today* Wand ER AAROM; 2x10 Table slide PROM Flexion x15 Table slide PROM Abd x15 Gripping; pink Theraputty; composite fist, performed x 2 minutes Wrist AROM; flexion/extension, RD/UD, pronation/supination; reviewed for HEP  Cold pack (unbilled) - for anti-inflammatory and analgesic effect as needed for reduced pain and improved ability to participate in active PT intervention, along R shoulder in sitting, pillow propped under R forearm for upper limb support, x 5 minutes     PATIENT EDUCATION:  Education details: Plan of care Person educated: Patient Education method: Explanation Education comprehension: verbalized understanding     HOME EXERCISE PROGRAM: Access Code: [email or web address] URL: [email or web address] Date: 10/10/2022 Prepared by: Valentina Gu  Exercises - Flexion-Extension Shoulder Pendulum with Table Support  - 2 x daily - 7 x weekly - 3 sets - 10 reps - Seated Scapular Retraction  - 2 x daily - 7 x weekly - 2 sets - 10 reps - 3sec hold - Supine Shoulder Flexion Extension AAROM with Dowel  - 2 x daily - 7 x weekly - 2 sets - 10 reps - Supine Shoulder External Rotation in 45 Degrees Abduction AAROM with Dowel  - 2 x daily - 7 x weekly - 2 sets - 10 reps - Seated Shoulder Flexion AAROM with Pulley Behind  - 2 x daily - 7 x weekly - 2 sets - 10 reps     ASSESSMENT:   CLINICAL IMPRESSION: Patient has been able to markedly progress ROM over the previous week and demonstrates forward elevation approaching [facility] with active-assisted drills. He has no significant discomfort with active external rotation or submaximal RTC isometrics at wall. Pt does fortunately report low NPRS and feels that his shoulder/arm symptoms are relatively mild at this time post-operatively; he does have diffuse pain pattern affecting R forearm and R  radial styloid/R 1st CMC region. Discussed possible tenosynovitis (DeQuervain's) contributing to current  condition and recommended discussing this with orthopedics. Patient has ongoing post-operative impairments in R shoulder AROM and PROM, post-operative pain, edema, decreased strength. Patient will benefit from continued skilled PT to address above impairments and improve overall function.   REHAB POTENTIAL: Good   CLINICAL DECISION MAKING: Stable/uncomplicated   EVALUATION COMPLEXITY: Low     GOALS: Goals reviewed with patient? Yes   SHORT TERM GOALS: Target date: 09/14/2022   Pt will be independent with HEP to minimize adverse effects of immobilization and gently mobilize R shoulder to improve pain-free function at home and work. Baseline: 08/17/22: Baseline HEP initiated Goal status: INITIAL     LONG TERM GOALS: Target date: 12/07/22   Pt will increase FOTO to at least 56 to demonstrate significant improvement in function at home and work related to neck pain  Baseline: 08/17/22: 4 Goal status: INITIAL   2.  Pt will decrease worst shoulder pain by at least 3 points on the NPRS in order to demonstrate clinically significant reduction in shoulder pain. Baseline: 08/17/22: 5-6/10 at worst over last week; 09/23/22: 2/10 Goal status: INITIAL   3.   Pt will have R shoulder AROM at least within 10 degrees of contralateral upper extremity indicative of improved ROM as needed for reaching, overhead work, self-care activities/dressing/grooming Baseline: 08/17/22: NO AROM presently, severely limited PROM in early post-op phase; 09/23/22: AROM not appropriate yet at this point post-operatively Goal status: INITIAL   4.   Pt will have R shoulder MMTs at 4+/5 or greater for all directions performed indicative of improved strength as needed for completion of daily lifting, reaching, carrying tasks (e.g. 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   Pt will perform unilateral farmer's carry with 15 lbs for 100 ft without reproduction of pain simulating carrying items in [employer] including carrying trash bag  Baseline: 08/17/22: Unable to perform lifting/carrying; 09/23/22: unable Goal status: INITIAL     PLAN: PT FREQUENCY: 1-2x/week   PT DURATION: 12 weeks   PLANNED INTERVENTIONS: Therapeutic exercises, Therapeutic activity, Neuromuscular re-education, Balance training, Gait training, Patient/Family education,  Joint mobilization, Electrical stimulation, Cryotherapy, Moist heat, and Manual therapy   PLAN FOR NEXT SESSION: Progress with PROM and AAROM as tolerated with gradual transition to AROM. Continue with RTC and periscapular isometrics. Cryotherapy and modalities for pain prn. A/AAROM phase of post-op rehab at this time.     Valentina Gu, PT, DPT #[id number]  Eilleen Kempf, PT 10/07/2022, 9:33 AM

## 2022-10-12 ENCOUNTER — Ambulatory Visit: Payer: Medicare Other | Admitting: Physical Therapy

## 2022-10-12 NOTE — Therapy (Deleted)
OUTPATIENT PHYSICAL THERAPY TREATMENT NOTE   Patient Name: Shawn Atkins MRN: [id number] DOB:03/09/1956, 66 y.o., male Today's Date: 10/12/2022  PCP: Lynnell Jude, MD REFERRING PROVIDER: [name], MD  END OF SESSION:      Past Medical History:  Diagnosis Date   BPH (benign prostatic hypertrophy)    Bronchitis    Chronic prostatitis    Complication of anesthesia    Felt like "couldn't breathe" after triple Hernia surgery   Diabetes mellitus without complication ([place])    Flank pain    GERD (gastroesophageal reflux disease)    h/o   Gross hematuria    History of kidney stones    HTN (hypertension)    pt states he takes lisinopril for kidney protection due to dm not htn   Inguinal hernia    Nocturia    OSA on CPAP    with 02 2 L   Overweight    PONV (postoperative nausea and vomiting)    only during kidney stone surgery   Spermatocele    Stricture of urethra    Vertigo    1 episode, 6-7 yrs ago   Past Surgical History:  Procedure Laterality Date   BICEPT TENODESIS Right 08/06/2022   Procedure: BICEPS TENODESIS;  Surgeon: [name], MD;  Location: [facility];  Service: Orthopedics;  Laterality: Right;   CATARACT EXTRACTION W/PHACO Left 07/22/2020   Procedure: CATARACT EXTRACTION PHACO AND INTRAOCULAR LENS PLACEMENT (IOC) LEFT DIABETIC 4.14  00:33.0;  Surgeon: Eulogio Bear, MD;  Location: [facility];  Service: Ophthalmology;  Laterality: Left;  Diabetic - oral meds   CATARACT EXTRACTION W/PHACO Right 08/12/2020   Procedure: CATARACT EXTRACTION PHACO AND INTRAOCULAR LENS PLACEMENT ([place]) RIGHT DIABETIC;  Surgeon: Eulogio Bear, MD;  Location: [facility];  Service: Ophthalmology;  Laterality: Right;  1.99 0:26.2   COLONOSCOPY WITH PROPOFOL N/A 11/20/2015   Procedure: COLONOSCOPY WITH PROPOFOL;  Surgeon: Christene Lye, MD;  Location: [facility];  Service: Endoscopy;  Laterality: N/A;   HERNIA REPAIR Bilateral [id number]    umbilical and bil inguinal/ Dr Marina Gravel   KIDNEY STONE SURGERY     KNEE ARTHROSCOPY Left    open lithotomy     SHOULDER ARTHROSCOPY WITH OPEN ROTATOR CUFF REPAIR AND DISTAL CLAVICLE ACROMINECTOMY Right 08/06/2022   Procedure: SHOULDER ARTHROSCOPY WITH OPEN ROTATOR CUFF REPAIR AND DISTAL CLAVICLE ACROMINECTOMY;  Surgeon: [name], MD;  Location: [facility];  Service: Orthopedics;  Laterality: Right;   Patient Active Problem List   Diagnosis Date Noted   History of rectal bleeding 06/01/2018   Left groin pain 03/18/2018   Sacroiliac joint pain 01/13/2017   Abdominal pain, left lower quadrant 08/23/2015   BPH with obstruction/lower urinary tract symptoms 08/23/2015   Biceps tendinitis 06/25/2015    REFERRING DIAG: M75.101 Unspecified rotator cuff tear or rupture of right shoulder, not specified as traumatic  THERAPY DIAG:  Acute pain of right shoulder  Stiffness of right shoulder, not elsewhere classified  Muscle weakness (generalized)  Rationale for Evaluation and Treatment Rehabilitation   PERTINENT HISTORY: Pt is a 66 year old male s/p R shoulder rotator cuff repair and distal clavicle acromionectomy with Dr. Mack Guise on 08/06/22. Patient reports that his surgeon informed him he did have full tear of rotator cuff in surgery. Patient reports 4 days of severe pain just out of surgery. Patient reports managing pain well with Tylenol/Ibuprofen well now. Pt followed up with [facility] for erythema and increased temperature as well as notable edema; ruled out infection  and DVT. Patient reports comorbid neck and R upper trap pain. Pt reports difficulty with sleep positioning due to comorbid neck pain.    Pain:  Pain Intensity: Present: 2-3/10, Best: 2/10, Worst: 5-6/10 Pain location: R shoulder along deltoid and biceps Pain Quality: steady pain, intense intermittently; sharp pain along ACJ that comes and goes Radiating: Yes , to anterior arm Numbness/Tingling: No Aggravating  factors: moving his R arm Relieving factors: Ibuprofen, Tylenol, resting arm/arm propped on armrest  History of prior shoulder or neck/shoulder injury, pain, surgery, or therapy: Yes, L shoulder with hx of cortisone injections, Hx of neck injury/PT for his neck Falls: Has patient fallen in last 6 months? No Dominant hand: left Imaging: Yes , MRI pre-operatively Prior level of function: Independent Occupational demands: Pt works part-time with [employer] (20 hrs/week), pt out of work now on medical leave Hobbies: [employer] (personal history of cancer, chills/fever, night sweats, nausea, vomiting, unrelenting pain): Negative   Precautions: None   Weight Bearing Restrictions: No   Living Environment Lives with: lives with their spouse Lives in: House/apartment     Patient Goals: Able to pick up weight with his arm, fish; able to play with baseball/bat with his grandson    PRECAUTIONS: No R shoulder AROM, no RUE lifting   DOS: 08/06/22, s/p R large RTC repair, R distal acromionectomy -8 weeks post-op 10/01/22   SUBJECTIVE:                                                                                                                                                                                      SUBJECTIVE STATEMENT:  Patient reports ongoing burning affecting R radial styloid/distal radius region. Patient reports minimal pain affecting R shoulder/arm today. Patient reports compliance with HEP. Pt is following up with surgeon's office at 11:00 today.    PAIN:  Are you having pain? Yes: NPRS scale: 1-2/10 pain at arrival  Pain location: pain into R forearm and radial aspect of wrist    OBJECTIVE: (objective measures completed at initial evaluation unless otherwise dated)     Patient Surveys  FOTO: 4, predicted outcome score of 56     Cognition Patient is oriented to person, place, and time.  Recent memory is intact.  Remote memory is intact.  Attention span and  concentration are intact.  Expressive speech is intact.  Patient's fund of knowledge is within normal limits for educational level.                          Gross Musculoskeletal Assessment Tremor: None Bulk: Atrophy of deltoid/posterior cuff mm Tone: Normal   Observation  No sign of acute infection, mild erythema along R upper arm, mild increased tissue temperature along R middle deltoid         Posture FHRS posture     AROM       AROM (Normal range in degrees) AROM 08/17/2022    Right Left  Shoulder      Flexion Deferred    Extension Deferred    Abduction Deferred    External Rotation Deferred    Internal Rotation Deferred    Hands Behind Head Deferred    Hands Behind Back Deferred           Elbow      Flexion WNL    Extension WNL    Pronation 50    Supination 60    (* = pain; Blank rows = not tested)     PROM     PROM (Normal range in degrees) AROM 08/17/2022    Right Left  Shoulder      Flexion 40    Extension      Abduction      External Rotation (arm at side) To neutral/0 deg    Internal Rotation (arm at side) 45          LE MMT: Manual muscle tests deferred to later date, post-op restrictions MMT (out of 5) Right 08/17/2022 Left 08/17/2022         Shoulder   Flexion      Extension      Abduction      External rotation      Internal rotation      Horizontal abduction      Horizontal adduction      Lower Trapezius      Rhomboids             Elbow  Flexion      Extension      Pronation      Supination             Wrist  Flexion      Extension      Radial deviation      Ulnar deviation      (* = pain; Blank rows = not tested)     Sensation Deferred   Reflexes Deferred     Palpation   Location LEFT  RIGHT           Subocciptials      Cervical paraspinals      Upper Trapezius      Levator Scapulae      Rhomboid Major/Minor      Sternoclavicular joint      Acromioclavicular joint   2  Coracoid process   1  Long  head of biceps   1  Supraspinatus   2  Infraspinatus   1  Subscapularis      Teres Minor      Teres Major      Pectoralis Major      Pectoralis Minor      Anterior Deltoid   1  Lateral Deltoid   2  Posterior Deltoid      Latissimus Dorsi      Sternocleidomastoid      (Blank rows = not tested) Graded on 0-4 scale (0 = no pain, 1 = pain, 2 = pain with wincing/grimacing/flinching, 3 = pain with withdrawal, 4 = unwilling to allow palpation), (Blank rows = not tested)       TODAY'S TREATMENT     (+) Finkelstein's test  Tenderness to palpation along R brachioradialis, R radial styloid, R CMC joint line, R tunnel 1 tendons proximal to insertions    Manual Therapy - for R shoulder ROM and to prevent R shoulder stiffness   R shoulder PROM flexion, abduction, ER/IR x 10 minutes Gentle STM to R anterior and middle deltoid, biceps, upper trapezius, brachioradialis x 2 minutes  Rhythmic stabilization; arm at 100 deg forward elevation; 2x1 minute (moderate perturbations)     Therapeutic Exercise - for HEP establishment, discussion on appropriate exercise/activity modification, shoulder ROM  Wand flexion AAROM; 2x10, with bent elbow to reduce lever arm Sidelying ER AROM; 2x10, verbal and tactile cueing for technique Pulleys; forward elevation; x 2 minutes Shoulder isometrics; flexion, ER, IR; x15 each, 5 sec hold (towel at wall)   PATIENT EDUCATION: Reviewed post-op restrictions. Dicussed possible contribution of cervical spine referred pain to current diffuse pain pattern as well as potential DeQuervain's tenosynovitis/tunnel 1 involvement.      *not today* Wand ER AAROM; 2x10 Table slide PROM Flexion x15 Table slide PROM Abd x15 Gripping; pink Theraputty; composite fist, performed x 2 minutes Wrist AROM; flexion/extension, RD/UD, pronation/supination; reviewed for HEP  Cold pack (unbilled) - for anti-inflammatory and analgesic effect as needed for reduced pain and improved  ability to participate in active PT intervention, along R shoulder in sitting, pillow propped under R forearm for upper limb support, x 5 minutes     PATIENT EDUCATION:  Education details: Plan of care Person educated: Patient Education method: Explanation Education comprehension: verbalized understanding     HOME EXERCISE PROGRAM: Access Code: [email or web address] URL: [email or web address] Date: 10/10/2022 Prepared by: Valentina Gu  Exercises - Flexion-Extension Shoulder Pendulum with Table Support  - 2 x daily - 7 x weekly - 3 sets - 10 reps - Seated Scapular Retraction  - 2 x daily - 7 x weekly - 2 sets - 10 reps - 3sec hold - Supine Shoulder Flexion Extension AAROM with Dowel  - 2 x daily - 7 x weekly - 2 sets - 10 reps - Supine Shoulder External Rotation in 45 Degrees Abduction AAROM with Dowel  - 2 x daily - 7 x weekly - 2 sets - 10 reps - Seated Shoulder Flexion AAROM with Pulley Behind  - 2 x daily - 7 x weekly - 2 sets - 10 reps     ASSESSMENT:   CLINICAL IMPRESSION: Patient has been able to markedly progress ROM over the previous week and demonstrates forward elevation approaching [facility] with active-assisted drills. He has no significant discomfort with active external rotation or submaximal RTC isometrics at wall. Pt does fortunately report low NPRS and feels that his shoulder/arm symptoms are relatively mild at this time post-operatively; he does have diffuse pain pattern affecting R forearm and R radial styloid/R 1st CMC region. Discussed possible tenosynovitis (DeQuervain's) contributing to current condition and recommended discussing this with orthopedics. Patient has ongoing post-operative impairments in R shoulder AROM and PROM, post-operative pain, edema, decreased strength. Patient will benefit from continued skilled PT to address above impairments and improve overall function.   REHAB POTENTIAL: Good   CLINICAL DECISION MAKING: Stable/uncomplicated    EVALUATION COMPLEXITY: Low     GOALS: Goals reviewed with patient? Yes   SHORT TERM GOALS: Target date: 09/14/2022   Pt will be independent with HEP to minimize adverse effects of immobilization and gently mobilize R shoulder to improve pain-free function at home and work. Baseline: 08/17/22: Baseline HEP initiated Goal status: INITIAL     LONG  TERM GOALS: Target date: 12/07/22   Pt will increase FOTO to at least 56 to demonstrate significant improvement in function at home and work related to neck pain  Baseline: 08/17/22: 4 Goal status: INITIAL   2.  Pt will decrease worst shoulder pain by at least 3 points on the NPRS in order to demonstrate clinically significant reduction in shoulder pain. Baseline: 08/17/22: 5-6/10 at worst over last week; 09/23/22: 2/10 Goal status: INITIAL   3.   Pt will have R shoulder AROM at least within 10 degrees of contralateral upper extremity indicative of improved ROM as needed for reaching, overhead work, self-care activities/dressing/grooming Baseline: 08/17/22: NO AROM presently, severely limited PROM in early post-op phase; 09/23/22: AROM not appropriate yet at this point post-operatively Goal status: INITIAL   4.   Pt will have R shoulder MMTs at 4+/5 or greater for all directions performed indicative of improved strength as needed for completion of daily lifting, reaching, carrying tasks (e.g. 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   Pt will perform unilateral farmer's carry with 15 lbs for 100 ft without reproduction of pain simulating carrying items in [employer] including carrying trash bag  Baseline: 08/17/22: Unable to perform lifting/carrying; 09/23/22: unable Goal status: INITIAL     PLAN: PT FREQUENCY: 1-2x/week   PT DURATION: 12 weeks   PLANNED INTERVENTIONS:  Therapeutic exercises, Therapeutic activity, Neuromuscular re-education, Balance training, Gait training, Patient/Family education,  Joint mobilization, Electrical stimulation, Cryotherapy, Moist heat, and Manual therapy   PLAN FOR NEXT SESSION: Progress with PROM and AAROM as tolerated with gradual transition to AROM. Continue with RTC and periscapular isometrics. Cryotherapy and modalities for pain prn. A/AAROM phase of post-op rehab at this time.     Valentina Gu, PT, DPT #[id number]  Eilleen Kempf, PT 10/12/2022, 8:07 AM

## 2022-10-14 ENCOUNTER — Encounter: Payer: Medicare Other | Admitting: Physical Therapy

## 2022-10-21 ENCOUNTER — Ambulatory Visit: Payer: Medicare Other | Admitting: Physical Therapy

## 2022-10-21 DIAGNOSIS — M25511 Pain in right shoulder: Secondary | ICD-10-CM

## 2022-10-21 DIAGNOSIS — M25611 Stiffness of right shoulder, not elsewhere classified: Secondary | ICD-10-CM

## 2022-10-21 DIAGNOSIS — M6281 Muscle weakness (generalized): Secondary | ICD-10-CM

## 2022-10-21 NOTE — Therapy (Unsigned)
OUTPATIENT PHYSICAL THERAPY TREATMENT NOTE   Patient Name: [facility] MRN: [id number] DOB:Apr 02, 1956, 66 y.o., male Today's Date: 10/21/2022  PCP: Shawn Jude, MD REFERRING PROVIDER: [name], MD  END OF SESSION:   PT End of Session - 10/21/22 1354     Visit Number 15    Number of Visits 30    Date for PT Re-Evaluation 12/07/22    Authorization Type [employer] Medicare, VL based on medical necessity    Authorization Time Period 08/18/23-10/26/22    Progress Note Due on Visit 10    PT Start Time 1330    PT Stop Time 1413    PT Time Calculation (min) 43 min    Activity Tolerance Patient limited by pain;Patient tolerated treatment well    Behavior During Therapy WFL for tasks assessed/performed               Past Medical History:  Diagnosis Date   BPH (benign prostatic hypertrophy)    Bronchitis    Chronic prostatitis    Complication of anesthesia    Felt like "couldn't breathe" after triple Hernia surgery   Diabetes mellitus without complication (HCC)    Flank pain    GERD (gastroesophageal reflux disease)    h/o   Gross hematuria    History of kidney stones    HTN (hypertension)    pt states he takes lisinopril for kidney protection due to dm not htn   Inguinal hernia    Nocturia    OSA on CPAP    with 02 2 L   Overweight    PONV (postoperative nausea and vomiting)    only during kidney stone surgery   Spermatocele    Stricture of urethra    Vertigo    1 episode, 6-7 yrs ago   Past Surgical History:  Procedure Laterality Date   BICEPT TENODESIS Right 08/06/2022   Procedure: BICEPS TENODESIS;  Surgeon: Shawn Park, MD;  Location: [facility];  Service: Orthopedics;  Laterality: Right;   CATARACT EXTRACTION W/PHACO Left 07/22/2020   Procedure: CATARACT EXTRACTION PHACO AND INTRAOCULAR LENS PLACEMENT (IOC) LEFT DIABETIC 4.14  00:33.0;  Surgeon: Shawn Bear, MD;  [facility];  Service: Ophthalmology;  Laterality: Left;   Diabetic - oral meds   CATARACT EXTRACTION W/PHACO Right 08/12/2020   Procedure: CATARACT EXTRACTION PHACO AND INTRAOCULAR LENS PLACEMENT ([facility]) RIGHT DIABETIC;  Surgeon: Shawn Bear, MD;  [facility];  Service: Ophthalmology;  Laterality: Right;  1.99 0:26.2   COLONOSCOPY WITH PROPOFOL N/A 11/20/2015   Procedure: COLONOSCOPY WITH PROPOFOL;  Surgeon: Shawn Lye, MD;  Location: [facility];  Service: Endoscopy;  Laterality: N/A;   HERNIA REPAIR Bilateral 5284   umbilical and bil inguinal/ Dr Shawn [facility]   KIDNEY STONE SURGERY     KNEE ARTHROSCOPY Left    open lithotomy     SHOULDER ARTHROSCOPY WITH OPEN ROTATOR CUFF REPAIR AND DISTAL CLAVICLE ACROMINECTOMY Right 08/06/2022   Procedure: SHOULDER ARTHROSCOPY WITH OPEN ROTATOR CUFF REPAIR AND DISTAL CLAVICLE ACROMINECTOMY;  Surgeon: Shawn Park, MD;  Location: [facility];  Service: Orthopedics;  Laterality: Right;   Patient Active Problem List   Diagnosis Date Noted   History of rectal bleeding 06/01/2018   Left groin pain 03/18/2018   Sacroiliac joint pain 01/13/2017   Abdominal pain, left lower quadrant 08/23/2015   BPH with obstruction/lower urinary tract symptoms 08/23/2015   Biceps tendinitis 06/25/2015    REFERRING DIAG: M75.101 Unspecified rotator cuff tear or rupture of  right shoulder, not specified as traumatic  THERAPY DIAG:  Acute pain of right shoulder  Stiffness of right shoulder, not elsewhere classified  Muscle weakness (generalized)  Rationale for Evaluation and Treatment Rehabilitation   PERTINENT HISTORY: Pt is a 66 year old male s/p R shoulder rotator cuff repair and distal clavicle acromionectomy with [facility] on 08/06/22. Patient reports that his surgeon informed him he did have full tear of rotator cuff in surgery. Patient reports 4 days of severe pain just out of surgery. Patient reports managing pain well with Tylenol/Ibuprofen well now. Pt followed up with [facility] for  erythema and increased temperature as well as notable edema; ruled out infection and DVT. Patient reports comorbid neck and R upper trap pain. Pt reports difficulty with sleep positioning due to comorbid neck pain.    Pain:  Pain Intensity: Present: 2-3/10, Best: 2/10, Worst: 5-6/10 Pain location: R shoulder along deltoid and biceps Pain Quality: steady pain, intense intermittently; sharp pain along ACJ that comes and goes Radiating: Yes , to anterior arm Numbness/Tingling: No Aggravating factors: moving his R arm Relieving factors: Ibuprofen, Tylenol, resting arm/arm propped on armrest  History of prior shoulder or neck/shoulder injury, pain, surgery, or therapy: Yes, L shoulder with hx of cortisone injections, Hx of neck injury/PT for his neck Falls: Has patient fallen in last 6 months? No Dominant hand: left Imaging: Yes , MRI pre-operatively Prior level of function: Independent Occupational demands: Pt works part-time with [employer] (20 hrs/week), pt out of work now on medical leave Hobbies: [facility] (personal history of cancer, chills/fever, night sweats, nausea, vomiting, unrelenting pain): Negative   Precautions: None   Weight Bearing Restrictions: No   Living Environment Lives with: lives with their spouse Lives in: House/apartment     Patient Goals: Able to pick up weight with his arm, fish; able to play with baseball/bat with his grandson    PRECAUTIONS: No R shoulder AROM, no RUE lifting   DOS: 08/06/22, s/p R large RTC repair, R distal acromionectomy -8 weeks post-op 10/01/22   SUBJECTIVE:                                                                                                                                                                                      SUBJECTIVE STATEMENT:  Patient has been following up with Dr. 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 Pt states that his physician informed him he should work in cervical spine with shoulder rehab. Pt is not sure if he has referral for cervical spine. He reports experiencing more R  shoulder pain over the last day or so, though pain in shoulder has been mild. Pt was informed that pain in R arm may be largely linked to cervical spine.    PAIN:  Are you having pain? Yes: NPRS scale: 1-2/10 pain at arrival  Pain location: pain into R forearm and radial aspect of wrist    OBJECTIVE: (objective measures completed at initial evaluation unless otherwise dated)     Patient Surveys  FOTO: 4, predicted outcome score of 56     Cognition Patient is oriented to person, place, and time.  Recent memory is intact.  Remote memory is intact.  Attention span and concentration are intact.  Expressive speech is intact.  Patient's fund of knowledge is within normal limits for educational level.                          Gross Musculoskeletal Assessment Tremor: None Bulk: Atrophy of deltoid/posterior cuff mm Tone: Normal   Observation No sign of acute infection, mild erythema along R upper arm, mild increased tissue temperature along R middle deltoid         Posture FHRS posture     AROM       AROM (Normal range in degrees) AROM 08/17/2022    Right Left  Shoulder      Flexion Deferred    Extension Deferred    Abduction Deferred    External Rotation Deferred    Internal Rotation Deferred    Hands Behind Head Deferred    Hands Behind Back Deferred           Elbow      Flexion WNL    Extension WNL    Pronation 50    Supination 60    (* = pain; Blank rows = not tested)     PROM     PROM (Normal range in degrees) AROM 08/17/2022    Right Left  Shoulder      Flexion 40    Extension      Abduction      External Rotation (arm at side) To neutral/0 deg    Internal Rotation (arm at side) 45          LE MMT: Manual muscle tests deferred to  later date, post-op restrictions MMT (out of 5) Right 08/17/2022 Left 08/17/2022         Shoulder   Flexion      Extension      Abduction      External rotation      Internal rotation      Horizontal abduction      Horizontal adduction      Lower Trapezius      Rhomboids             Elbow  Flexion      Extension      Pronation      Supination             Wrist  Flexion      Extension      Radial deviation      Ulnar deviation      (* = pain; Blank rows = not tested)     Sensation Deferred   Reflexes Deferred     Palpation   Location LEFT  RIGHT           Subocciptials      Cervical paraspinals      Upper Trapezius  Levator Scapulae      Rhomboid Major/Minor      Sternoclavicular joint      Acromioclavicular joint   2  Coracoid process   1  Long head of biceps   1  Supraspinatus   2  Infraspinatus   1  Subscapularis      Teres Minor      Teres Major      Pectoralis Major      Pectoralis Minor      Anterior Deltoid   1  Lateral Deltoid   2  Posterior Deltoid      Latissimus Dorsi      Sternocleidomastoid      (Blank rows = not tested) Graded on 0-4 scale (0 = no pain, 1 = pain, 2 = pain with wincing/grimacing/flinching, 3 = pain with withdrawal, 4 = unwilling to allow palpation), (Blank rows = not tested)       TODAY'S TREATMENT    -Reviewed imaging from [facility] and radiologist.    Manual Therapy - for R shoulder ROM and to prevent R shoulder stiffness    R shoulder PROM flexion, abduction, ER/IR x 10 minutes Gentle STM to R anterior and middle deltoid, biceps, upper trapezius, brachioradialis x 3 minutes   *next visit* Rhythmic stabilization; arm at 100 deg forward elevation; 2x1 minute (moderate perturbations)     Therapeutic Exercise - for HEP establishment, discussion on appropriate exercise/activity modification, shoulder ROM  Wand flexion AAROM; 2x10, with straight arm today Wand ER AAROM; 2x10 Sidelying ER AROM; 2x10,  verbal and tactile cueing for technique  Shoulder isometrics; flexion, ER, IR; x15 each, 5 sec hold (towel at wall)   PATIENT EDUCATION: HEP update and review. Discussed with pt initiating cervical spine intervention following referral and PT evaluation. We did discuss simple positioning strategies (e.g. cervical roll in lying to improve comfort) and stretches that could be used at home pending no increase in arm pain during performance of stretches.      *not today* Pulleys; forward elevation; x 2 minutes Wand ER AAROM; 2x10 Table slide PROM Flexion x15 Table slide PROM Abd x15 Gripping; pink Theraputty; composite fist, performed x 2 minutes Wrist AROM; flexion/extension, RD/UD, pronation/supination; reviewed for HEP  Cold pack (unbilled) - for anti-inflammatory and analgesic effect as needed for reduced pain and improved ability to participate in active PT intervention, along R shoulder in sitting, pillow propped under R forearm for upper limb support, x 5 minutes     PATIENT EDUCATION:  Education details: Plan of care Person educated: Patient Education method: Explanation Education comprehension: verbalized understanding     HOME EXERCISE PROGRAM: Access Code: [email or web address] URL: [email or web address] Date: 10/10/2022 Prepared by: Valentina Gu  Exercises - Flexion-Extension Shoulder Pendulum with Table Support  - 2 x daily - 7 x weekly - 3 sets - 10 reps - Seated Scapular Retraction  - 2 x daily - 7 x weekly - 2 sets - 10 reps - 3sec hold - Supine Shoulder Flexion Extension AAROM with Dowel  - 2 x daily - 7 x weekly - 2 sets - 10 reps - Supine Shoulder External Rotation in 45 Degrees Abduction AAROM with Dowel  - 2 x daily - 7 x weekly - 2 sets - 10 reps - Seated Shoulder Flexion AAROM with Pulley Behind  - 2 x daily - 7 x weekly - 2 sets - 10 reps     ASSESSMENT:   CLINICAL IMPRESSION: Patient has been recently struggling with comorbid neck pain and  diffuse R upper limb pain that affects patient down to R radial styloid region. This pain pattern is consistent with cervical spine disc herniation and a possible referred pain pattern. Pt is almost 11 weeks post-op and would not be expected to have substantial post-operative shoulder pain at this time. Pt is limited in PT participation due to comorbid neck pain and R upper limb referred pain. Discussed with pt completing assessment for C-spine and working on therapeutic exercise and manual therapy for cervical spine pending referral for this condition. Patient has ongoing post-operative impairments in R shoulder AROM and PROM, post-operative pain, edema, decreased strength. Patient will benefit from continued skilled PT to address above impairments and improve overall function.   REHAB POTENTIAL: Good   CLINICAL DECISION MAKING: Stable/uncomplicated   EVALUATION COMPLEXITY: Low     GOALS: Goals reviewed with patient? Yes   SHORT TERM GOALS: Target date: 09/14/2022   Pt will be independent with HEP to minimize adverse effects of immobilization and gently mobilize R shoulder to improve pain-free function at home and work. Baseline: 08/17/22: Baseline HEP initiated Goal status: INITIAL     LONG TERM GOALS: Target date: 12/07/22   Pt will increase FOTO to at least 56 to demonstrate significant improvement in function at home and work related to neck pain  Baseline: 08/17/22: 4 Goal status: INITIAL   2.  Pt will decrease worst shoulder pain by at least 3 points on the NPRS in order to demonstrate clinically significant reduction in shoulder pain. Baseline: 08/17/22: 5-6/10 at worst over last week; 09/23/22: 2/10 Goal status: INITIAL   3.   Pt will have R shoulder AROM at least within 10 degrees of contralateral upper extremity indicative of improved ROM as needed for reaching, overhead work, self-care activities/dressing/grooming Baseline: 08/17/22: NO AROM presently, severely limited PROM  in early post-op phase; 09/23/22: AROM not appropriate yet at this point post-operatively Goal status: INITIAL   4.   Pt will have R shoulder MMTs at 4+/5 or greater for all directions performed indicative of improved strength as needed for completion of daily lifting, reaching, carrying tasks (e.g. 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   Pt will perform unilateral farmer's carry with 15 lbs for 100 ft without reproduction of pain simulating carrying items in [employer] including carrying trash bag  Baseline: 08/17/22: Unable to perform lifting/carrying; 09/23/22: unable Goal status: INITIAL     PLAN: PT FREQUENCY: 1-2x/week   PT DURATION: 12 weeks   PLANNED INTERVENTIONS: Therapeutic exercises, Therapeutic activity, Neuromuscular re-education, Balance training, Gait training, Patient/Family education,  Joint mobilization, Electrical stimulation, Cryotherapy, Moist heat, and Manual therapy   PLAN FOR NEXT SESSION: Progress with PROM and AAROM as tolerated with gradual transition to AROM. Continue with RTC and periscapular isometrics. Cryotherapy and modalities for pain prn. A/AAROM phase of post-op rehab at this time.     Valentina Gu, PT, DPT #[id number]  Eilleen Kempf, PT 10/21/2022, 1:55 PM

## 2022-10-22 ENCOUNTER — Encounter: Payer: Self-pay | Admitting: Physical Therapy

## 2022-10-27 ENCOUNTER — Encounter: Payer: Self-pay | Admitting: Physical Therapy

## 2022-10-27 ENCOUNTER — Ambulatory Visit: Payer: Medicare Other | Attending: Orthopedic Surgery | Admitting: Physical Therapy

## 2022-10-27 DIAGNOSIS — M25611 Stiffness of right shoulder, not elsewhere classified: Secondary | ICD-10-CM | POA: Diagnosis present

## 2022-10-27 DIAGNOSIS — M6281 Muscle weakness (generalized): Secondary | ICD-10-CM | POA: Insufficient documentation

## 2022-10-27 DIAGNOSIS — M25511 Pain in right shoulder: Secondary | ICD-10-CM | POA: Diagnosis not present

## 2022-10-27 NOTE — Therapy (Unsigned)
OUTPATIENT PHYSICAL THERAPY TREATMENT NOTE   Patient Name: Shawn Atkins MRN: [id number] DOB:[birth year], 67 y.o., male Today's Date: 10/28/2022  PCP: Lynnell Jude, MD REFERRING PROVIDER: [name], MD  END OF SESSION:   PT End of Session - 10/28/22 1310     Visit Number 16    Number of Visits 30    Date for PT Re-Evaluation 12/07/22    Authorization Type [employer] Medicare, VL based on medical necessity    Authorization Time Period 08/18/23-10/26/22    Progress Note Due on Visit 10    PT Start Time 1518    PT Stop Time 1600    PT Time Calculation (min) 42 min    Activity Tolerance Patient limited by pain;Patient tolerated treatment well    Behavior During Therapy WFL for tasks assessed/performed                Past Medical History:  Diagnosis Date   BPH (benign prostatic hypertrophy)    Bronchitis    Chronic prostatitis    Complication of anesthesia    Felt like "couldn't breathe" after triple Hernia surgery   Diabetes mellitus without complication (HCC)    Flank pain    GERD (gastroesophageal reflux disease)    h/o   Gross hematuria    History of kidney stones    HTN (hypertension)    pt states he takes lisinopril for kidney protection due to dm not htn   Inguinal hernia    Nocturia    OSA on CPAP    with 02 2 L   Overweight    PONV (postoperative nausea and vomiting)    only during kidney stone surgery   Spermatocele    Stricture of urethra    Vertigo    1 episode, 6-7 yrs ago   Past Surgical History:  Procedure Laterality Date   BICEPT TENODESIS Right 08/06/2022   Procedure: BICEPS TENODESIS;  Surgeon: [name], MD;  Location: [facility];  Service: Orthopedics;  Laterality: Right;   CATARACT EXTRACTION W/PHACO Left 07/22/2020   Procedure: CATARACT EXTRACTION PHACO AND INTRAOCULAR LENS PLACEMENT ([facility]) LEFT DIABETIC 4.14  00:33.0;  Surgeon: Eulogio Bear, MD;  Location: [place];  Service: Ophthalmology;  Laterality: Left;   Diabetic - oral meds   CATARACT EXTRACTION W/PHACO Right 08/12/2020   Procedure: CATARACT EXTRACTION PHACO AND INTRAOCULAR LENS PLACEMENT ([place]) RIGHT DIABETIC;  Surgeon: Eulogio Bear, MD;  Location: [place];  Service: Ophthalmology;  Laterality: Right;  1.99 0:26.2   COLONOSCOPY WITH PROPOFOL N/A 11/20/2015   Procedure: COLONOSCOPY WITH PROPOFOL;  Surgeon: Christene Lye, MD;  Location: [facility];  Service: Endoscopy;  Laterality: N/A;   HERNIA REPAIR Bilateral 4098   umbilical and bil inguinal/ Dr Marina Gravel   KIDNEY STONE SURGERY     KNEE ARTHROSCOPY Left    open lithotomy     SHOULDER ARTHROSCOPY WITH OPEN ROTATOR CUFF REPAIR AND DISTAL CLAVICLE ACROMINECTOMY Right 08/06/2022   Procedure: SHOULDER ARTHROSCOPY WITH OPEN ROTATOR CUFF REPAIR AND DISTAL CLAVICLE ACROMINECTOMY;  Surgeon: [name], MD;  Location: [facility];  Service: Orthopedics;  Laterality: Right;   Patient Active Problem List   Diagnosis Date Noted   History of rectal bleeding 06/01/2018   Left groin pain 03/18/2018   Sacroiliac joint pain 01/13/2017   Abdominal pain, left lower quadrant 08/23/2015   BPH with obstruction/lower urinary tract symptoms 08/23/2015   Biceps tendinitis 06/25/2015    REFERRING DIAG: M75.101 Unspecified rotator cuff tear or rupture  of right shoulder, not specified as traumatic  THERAPY DIAG:  Acute pain of right shoulder  Stiffness of right shoulder, not elsewhere classified  Muscle weakness (generalized)  Rationale for Evaluation and Treatment Rehabilitation   PERTINENT HISTORY: Pt is a 67 year old male s/p R shoulder rotator cuff repair and distal clavicle acromionectomy with Dr. Mack Guise on 08/06/22. Patient reports that his surgeon informed him he did have full tear of rotator cuff in surgery. Patient reports 4 days of severe pain just out of surgery. Patient reports managing pain well with Tylenol/Ibuprofen well now. Pt followed up with [facility] for  erythema and increased temperature as well as notable edema; ruled out infection and DVT. Patient reports comorbid neck and R upper trap pain. Pt reports difficulty with sleep positioning due to comorbid neck pain.    Pain:  Pain Intensity: Present: 2-3/10, Best: 2/10, Worst: 5-6/10 Pain location: R shoulder along deltoid and biceps Pain Quality: steady pain, intense intermittently; sharp pain along ACJ that comes and goes Radiating: Yes , to anterior arm Numbness/Tingling: No Aggravating factors: moving his R arm Relieving factors: Ibuprofen, Tylenol, resting arm/arm propped on armrest  History of prior shoulder or neck/shoulder injury, pain, surgery, or therapy: Yes, L shoulder with hx of cortisone injections, Hx of neck injury/PT for his neck Falls: Has patient fallen in last 6 months? No Dominant hand: left Imaging: Yes , MRI pre-operatively Prior level of function: Independent Occupational demands: Pt works part-time with [employer] (20 hrs/week), pt out of work now on medical leave Hobbies: [employer] (personal history of cancer, chills/fever, night sweats, nausea, vomiting, unrelenting pain): Negative   Precautions: None   Weight Bearing Restrictions: No   Living Environment Lives with: lives with their spouse Lives in: House/apartment     Patient Goals: Able to pick up weight with his arm, fish; able to play with baseball/bat with his grandson    PRECAUTIONS: No R shoulder AROM, no RUE lifting   DOS: 08/06/22, s/p R large RTC repair, R distal acromionectomy -8 weeks post-op 10/01/22 -12 weeks post-op 10/29/22   SUBJECTIVE:                                                                                                                                                                                      SUBJECTIVE STATEMENT:  Patient reports difficulty with sleeping due to pain in supraclavicular/upper trap region on his R side. He reports intermittent difficulty with R  arm pain when he moves it abruptly. He has injection scheduled for tomorrow for cervical spine (type of injection unspecified).    PAIN:  Are you having pain? Yes: NPRS scale: 1-2/10 pain at arrival  Pain location: pain into R paracervical/upper trap region mainly     OBJECTIVE: (objective measures completed at initial evaluation unless otherwise dated)     Patient Surveys  FOTO: 4, predicted outcome score of 56     Cognition Patient is oriented to person, place, and time.  Recent memory is intact.  Remote memory is intact.  Attention span and concentration are intact.  Expressive speech is intact.  Patient's fund of knowledge is within normal limits for educational level.                          Gross Musculoskeletal Assessment Tremor: None Bulk: Atrophy of deltoid/posterior cuff mm Tone: Normal   Observation No sign of acute infection, mild erythema along R upper arm, mild increased tissue temperature along R middle deltoid         Posture FHRS posture     AROM       AROM (Normal range in degrees) AROM 08/17/2022    Right Left  Shoulder      Flexion Deferred    Extension Deferred    Abduction Deferred    External Rotation Deferred    Internal Rotation Deferred    Hands Behind Head Deferred    Hands Behind Back Deferred           Elbow      Flexion WNL    Extension WNL    Pronation 50    Supination 60    (* = pain; Blank rows = not tested)     PROM     PROM (Normal range in degrees) AROM 08/17/2022    Right Left  Shoulder      Flexion 40    Extension      Abduction      External Rotation (arm at side) To neutral/0 deg    Internal Rotation (arm at side) 45          LE MMT: Manual muscle tests deferred to later date, post-op restrictions MMT (out of 5) Right 08/17/2022 Left 08/17/2022         Shoulder   Flexion      Extension      Abduction      External rotation      Internal rotation      Horizontal abduction      Horizontal  adduction      Lower Trapezius      Rhomboids             Elbow  Flexion      Extension      Pronation      Supination             Wrist  Flexion      Extension      Radial deviation      Ulnar deviation      (* = pain; Blank rows = not tested)     Sensation Deferred   Reflexes Deferred     Palpation   Location LEFT  RIGHT           Subocciptials      Cervical paraspinals      Upper Trapezius      Levator Scapulae      Rhomboid Major/Minor      Sternoclavicular joint      Acromioclavicular joint   2  Coracoid process   1  Long head of biceps   1  Supraspinatus   2  Infraspinatus   1  Subscapularis      Teres Minor      Teres Major      Pectoralis Major      Pectoralis Minor      Anterior Deltoid   1  Lateral Deltoid   2  Posterior Deltoid      Latissimus Dorsi      Sternocleidomastoid      (Blank rows = not tested) Graded on 0-4 scale (0 = no pain, 1 = pain, 2 = pain with wincing/grimacing/flinching, 3 = pain with withdrawal, 4 = unwilling to allow palpation), (Blank rows = not tested)       TODAY'S TREATMENT   Manual Therapy - for R shoulder ROM and to prevent R shoulder stiffness; manual perturbations for re-training of stabilizers of GHJ    R shoulder PROM flexion, abduction, ER/IR x 10 minutes Gentle STM to R anterior and middle deltoid, biceps, upper trapezius, brachioradialis x 2 minutes DTM/Tpr R upper trapezius; x 3 minutes  -Applied Biofreeze to R upper trapezius and R cervical paraspinals for topica analgesic effect and to improve tolerance of R shoulder ROM  Rhythmic stabilization; arm at 100 deg forward elevation; 1 x 2 minutes (moderate perturbations)     Therapeutic Exercise - for shoulder ROM as needed for reaching and grasping tasks, self-care ADLs, and household chores  Supine flexion AROM; 2x10  Sidelying ER AROM; 2x10, verbal and tactile cueing for technique  Wand ER AAROM at 60 deg abduction, in supine; 2x10  Wand  flexion AAROM, in standing; 2x10, with straight arm today  Standing Tband row, Green Tband; 2x12     *not today* Pulleys; forward elevation; x 2 minutes Wand ER AAROM; 2x10 Table slide PROM Flexion x15 Table slide PROM Abd x15 Gripping; pink Theraputty; composite fist, performed x 2 minutes Wrist AROM; flexion/extension, RD/UD, pronation/supination; reviewed for HEP  Cold pack (unbilled) - for anti-inflammatory and analgesic effect as needed for reduced pain and improved ability to participate in active PT intervention, along R shoulder in sitting, pillow propped under R forearm for upper limb support, x 5 minutes     PATIENT EDUCATION:  Education details: Plan of care Person educated: Patient Education method: Explanation Education comprehension: verbalized understanding     HOME EXERCISE PROGRAM: Access Code: [email or web address] URL: [email or web address] Date: 10/10/2022 Prepared by: Valentina Gu  Exercises - Flexion-Extension Shoulder Pendulum with Table Support  - 2 x daily - 7 x weekly - 3 sets - 10 reps - Seated Scapular Retraction  - 2 x daily - 7 x weekly - 2 sets - 10 reps - 3sec hold - Supine Shoulder Flexion Extension AAROM with Dowel  - 2 x daily - 7 x weekly - 2 sets - 10 reps - Supine Shoulder External Rotation in 45 Degrees Abduction AAROM with Dowel  - 2 x daily - 7 x weekly - 2 sets - 10 reps - Seated Shoulder Flexion AAROM with Pulley Behind  - 2 x daily - 7 x weekly - 2 sets - 10 reps     ASSESSMENT:   CLINICAL IMPRESSION: Patient demonstrates markedly improving R shoulder ROM; he has passive forward elevation grossly WFL. Pt is still notably limited with shoulder complex abduction and glenohumeral ER. 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 There is no current referral for treating cervicalgia. Pt is awaiting injection  tomorrow for cervical spine  and will resume PT next week.. Patient has ongoing post-operative impairments in R shoulder AROM and PROM, post-operative pain, decreased strength. Patient will benefit from continued skilled PT to address above impairments and improve overall function.   REHAB POTENTIAL: Good   CLINICAL DECISION MAKING: Stable/uncomplicated   EVALUATION COMPLEXITY: Low     GOALS: Goals reviewed with patient? Yes   SHORT TERM GOALS: Target date: 09/14/2022   Pt will be independent with HEP to minimize adverse effects of immobilization and gently mobilize R shoulder to improve pain-free function at home and work. Baseline: 08/17/22: Baseline HEP initiated Goal status: INITIAL     LONG TERM GOALS: Target date: 12/07/22   Pt will increase FOTO to at least 56 to demonstrate significant improvement in function at home and work related to neck pain  Baseline: 08/17/22: 4 Goal status: INITIAL   2.  Pt will decrease worst shoulder pain by at least 3 points on the NPRS in order to demonstrate clinically significant reduction in shoulder pain. Baseline: 08/17/22: 5-6/10 at worst over last week; 09/23/22: 2/10 Goal status: INITIAL   3.   Pt will have R shoulder AROM at least within 10 degrees of contralateral upper extremity indicative of improved ROM as needed for reaching, overhead work, self-care activities/dressing/grooming Baseline: 08/17/22: NO AROM presently, severely limited PROM in early post-op phase; 09/23/22: AROM not appropriate yet at this point post-operatively Goal status: INITIAL   4.   Pt will have R shoulder MMTs at 4+/5 or greater for all directions performed indicative of improved strength as needed for completion of daily lifting, reaching, carrying tasks (e.g. 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   Pt will perform unilateral farmer's carry with 15 lbs for 100 ft without reproduction of pain simulating carrying items in [employer] including carrying trash bag  Baseline: 08/17/22: Unable to perform lifting/carrying; 09/23/22: unable Goal status: INITIAL     PLAN: PT FREQUENCY: 1-2x/week   PT DURATION: 12 weeks   PLANNED INTERVENTIONS: Therapeutic exercises, Therapeutic activity, Neuromuscular re-education, Balance training, Gait training, Patient/Family education,  Joint mobilization, Electrical stimulation, Cryotherapy, Moist heat, and Manual therapy   PLAN FOR NEXT SESSION: Progress to full R shoulder ROM as tolerated. Continue with R shoulder AA/AROM and gradually progress to light isotonics as tolerated. Continue periscapular isotonics.     Valentina Gu, PT, DPT #[id number]  Eilleen Kempf, PT 10/28/2022, 1:10 PM

## 2022-10-28 ENCOUNTER — Encounter: Payer: Medicare Other | Admitting: Physical Therapy

## 2022-11-03 ENCOUNTER — Ambulatory Visit: Payer: Medicare Other | Admitting: Physical Therapy

## 2022-11-05 ENCOUNTER — Encounter: Payer: Self-pay | Admitting: Physical Therapy

## 2022-11-05 ENCOUNTER — Ambulatory Visit: Payer: Medicare Other | Admitting: Physical Therapy

## 2022-11-05 DIAGNOSIS — M25511 Pain in right shoulder: Secondary | ICD-10-CM

## 2022-11-05 DIAGNOSIS — M6281 Muscle weakness (generalized): Secondary | ICD-10-CM

## 2022-11-05 DIAGNOSIS — M25611 Stiffness of right shoulder, not elsewhere classified: Secondary | ICD-10-CM

## 2022-11-05 NOTE — Therapy (Signed)
OUTPATIENT PHYSICAL THERAPY TREATMENT NOTE   Patient Name: Shawn Atkins MRN: [id number] DOB:[birth year], 67 y.o., male Today's Date: 11/05/2022  PCP: Lynnell Jude, MD REFERRING PROVIDER: [name], MD  END OF SESSION:   PT End of Session - 11/05/22 1411     Visit Number 17    Number of Visits 30    Date for PT Re-Evaluation 12/07/22    Authorization Type [employer] Medicare, VL based on medical necessity    Authorization Time Period 08/18/23-10/26/22    Progress Note Due on Visit 10    PT Start Time 1347    PT Stop Time 1429    PT Time Calculation (min) 42 min    Activity Tolerance Patient limited by pain;Patient tolerated treatment well    Behavior During Therapy WFL for tasks assessed/performed                 Past Medical History:  Diagnosis Date   BPH (benign prostatic hypertrophy)    Bronchitis    Chronic prostatitis    Complication of anesthesia    Felt like "couldn't breathe" after triple Hernia surgery   Diabetes mellitus without complication (HCC)    Flank pain    GERD (gastroesophageal reflux disease)    h/o   Gross hematuria    History of kidney stones    HTN (hypertension)    pt states he takes lisinopril for kidney protection due to dm not htn   Inguinal hernia    Nocturia    OSA on CPAP    with 02 2 L   Overweight    PONV (postoperative nausea and vomiting)    only during kidney stone surgery   Spermatocele    Stricture of urethra    Vertigo    1 episode, 6-7 yrs ago   Past Surgical History:  Procedure Laterality Date   BICEPT TENODESIS Right 08/06/2022   Procedure: BICEPS TENODESIS;  Surgeon: [name], MD;  Location: [facility];  Service: Orthopedics;  Laterality: Right;   CATARACT EXTRACTION W/PHACO Left 07/22/2020   Procedure: CATARACT EXTRACTION PHACO AND INTRAOCULAR LENS PLACEMENT ([facility]) LEFT DIABETIC 4.14  00:33.0;  Surgeon: Eulogio Bear, MD;  Location: [place];  Service: Ophthalmology;  Laterality: Left;   Diabetic - oral meds   CATARACT EXTRACTION W/PHACO Right 08/12/2020   Procedure: CATARACT EXTRACTION PHACO AND INTRAOCULAR LENS PLACEMENT ([place]) RIGHT DIABETIC;  Surgeon: Eulogio Bear, MD;  Location: [place];  Service: Ophthalmology;  Laterality: Right;  1.99 0:26.2   COLONOSCOPY WITH PROPOFOL N/A 11/20/2015   Procedure: COLONOSCOPY WITH PROPOFOL;  Surgeon: Christene Lye, MD;  Location: [facility];  Service: Endoscopy;  Laterality: N/A;   HERNIA REPAIR Bilateral 4193   umbilical and bil inguinal/ Dr Marina Gravel   KIDNEY STONE SURGERY     KNEE ARTHROSCOPY Left    open lithotomy     SHOULDER ARTHROSCOPY WITH OPEN ROTATOR CUFF REPAIR AND DISTAL CLAVICLE ACROMINECTOMY Right 08/06/2022   Procedure: SHOULDER ARTHROSCOPY WITH OPEN ROTATOR CUFF REPAIR AND DISTAL CLAVICLE ACROMINECTOMY;  Surgeon: [name], MD;  Location: [facility];  Service: Orthopedics;  Laterality: Right;   Patient Active Problem List   Diagnosis Date Noted   History of rectal bleeding 06/01/2018   Left groin pain 03/18/2018   Sacroiliac joint pain 01/13/2017   Abdominal pain, left lower quadrant 08/23/2015   BPH with obstruction/lower urinary tract symptoms 08/23/2015   Biceps tendinitis 06/25/2015    REFERRING DIAG: M75.101 Unspecified rotator cuff tear or  rupture of right shoulder, not specified as traumatic  THERAPY DIAG:  Acute pain of right shoulder  Stiffness of right shoulder, not elsewhere classified  Muscle weakness (generalized)  Rationale for Evaluation and Treatment Rehabilitation   PERTINENT HISTORY: Pt is a 67 year old male s/p R shoulder rotator cuff repair and distal clavicle acromionectomy with Dr. Mack Guise on 08/06/22. Patient reports that his surgeon informed him he did have full tear of rotator cuff in surgery. Patient reports 4 days of severe pain just out of surgery. Patient reports managing pain well with Tylenol/Ibuprofen well now. Pt followed up with [facility] for  erythema and increased temperature as well as notable edema; ruled out infection and DVT. Patient reports comorbid neck and R upper trap pain. Pt reports difficulty with sleep positioning due to comorbid neck pain.    Pain:  Pain Intensity: Present: 2-3/10, Best: 2/10, Worst: 5-6/10 Pain location: R shoulder along deltoid and biceps Pain Quality: steady pain, intense intermittently; sharp pain along ACJ that comes and goes Radiating: Yes , to anterior arm Numbness/Tingling: No Aggravating factors: moving his R arm Relieving factors: Ibuprofen, Tylenol, resting arm/arm propped on armrest  History of prior shoulder or neck/shoulder injury, pain, surgery, or therapy: Yes, L shoulder with hx of cortisone injections, Hx of neck injury/PT for his neck Falls: Has patient fallen in last 6 months? No Dominant hand: left Imaging: Yes , MRI pre-operatively Prior level of function: Independent Occupational demands: Pt works part-time with [employer] (20 hrs/week), pt out of work now on medical leave Hobbies: [employer] (personal history of cancer, chills/fever, night sweats, nausea, vomiting, unrelenting pain): Negative   Precautions: None   Weight Bearing Restrictions: No   Living Environment Lives with: lives with their spouse Lives in: House/apartment     Patient Goals: Able to pick up weight with his arm, fish; able to play with baseball/bat with his grandson    PRECAUTIONS: No R shoulder AROM, no RUE lifting   DOS: 08/06/22, s/p R large RTC repair, R distal acromionectomy -8 weeks post-op 10/01/22 -12 weeks post-op 10/29/22   SUBJECTIVE:                                                                                                                                                                                      SUBJECTIVE STATEMENT:  Patient reports notable improvement in neck pain and upper extremity referred pain in RUE. Patient reports low-level pain in R proximal arm/R  shoulder. Patient reports notable increase in blood glucose following steroid injection - this is improving now.    PAIN:  Are you having pain? Yes: NPRS scale: 1-2/10 pain at arrival  Pain location:  pain into R paracervical/upper trap region mainly     OBJECTIVE: (objective measures completed at initial evaluation unless otherwise dated)     Patient Surveys  FOTO: 4, predicted outcome score of 56     Cognition Patient is oriented to person, place, and time.  Recent memory is intact.  Remote memory is intact.  Attention span and concentration are intact.  Expressive speech is intact.  Patient's fund of knowledge is within normal limits for educational level.                          Gross Musculoskeletal Assessment Tremor: None Bulk: Atrophy of deltoid/posterior cuff mm Tone: Normal   Observation No sign of acute infection, mild erythema along R upper arm, mild increased tissue temperature along R middle deltoid         Posture FHRS posture     AROM       AROM (Normal range in degrees) AROM 08/17/2022    Right Left  Shoulder      Flexion Deferred    Extension Deferred    Abduction Deferred    External Rotation Deferred    Internal Rotation Deferred    Hands Behind Head Deferred    Hands Behind Back Deferred           Elbow      Flexion WNL    Extension WNL    Pronation 50    Supination 60    (* = pain; Blank rows = not tested)     PROM     PROM (Normal range in degrees) AROM 08/17/2022    Right Left  Shoulder      Flexion 40    Extension      Abduction      External Rotation (arm at side) To neutral/0 deg    Internal Rotation (arm at side) 45          LE MMT: Manual muscle tests deferred to later date, post-op restrictions MMT (out of 5) Right 08/17/2022 Left 08/17/2022         Shoulder   Flexion      Extension      Abduction      External rotation      Internal rotation      Horizontal abduction      Horizontal adduction       Lower Trapezius      Rhomboids             Elbow  Flexion      Extension      Pronation      Supination             Wrist  Flexion      Extension      Radial deviation      Ulnar deviation      (* = pain; Blank rows = not tested)     Sensation Deferred   Reflexes Deferred     Palpation   Location LEFT  RIGHT           Subocciptials      Cervical paraspinals      Upper Trapezius      Levator Scapulae      Rhomboid Major/Minor      Sternoclavicular joint      Acromioclavicular joint   2  Coracoid process   1  Long head of biceps   1  Supraspinatus   2  Infraspinatus  1  Subscapularis      Teres Minor      Teres Major      Pectoralis Major      Pectoralis Minor      Anterior Deltoid   1  Lateral Deltoid   2  Posterior Deltoid      Latissimus Dorsi      Sternocleidomastoid      (Blank rows = not tested) Graded on 0-4 scale (0 = no pain, 1 = pain, 2 = pain with wincing/grimacing/flinching, 3 = pain with withdrawal, 4 = unwilling to allow palpation), (Blank rows = not tested)       TODAY'S TREATMENT   Manual Therapy - for R shoulder ROM and to prevent R shoulder stiffness; manual perturbations for re-training of stabilizers of GHJ    R shoulder PROM flexion, abduction, ER/IR x 10 minutes Gentle STM to R anterior and middle deltoid, biceps, upper trapezius; x 2 minutes   *next visit* Rhythmic stabilization; arm at 100 deg forward elevation; 1 x 2 minutes (moderate perturbations)  *not today* DTM/Tpr R upper trapezius; x 3 minutes     Therapeutic Exercise - for shoulder ROM as needed for reaching and grasping tasks, self-care ADLs, and household chores  Supine flexion AROM; 2x10  Sidelying ER AROM; 2x10, verbal and tactile cueing for technique  Wand ER AAROM at 60 deg abduction, in supine; 2x10  Serratus slide, at wall; 2x10, 5 sec hold at top  Pulleys; shoulder complex abduction ; x 2 minutes  Standing Tband row, Green Tband;  2x12   *not today* Wand ER AAROM; 2x10 Table slide PROM Flexion x15 Table slide PROM Abd x15 Gripping; pink Theraputty; composite fist, performed x 2 minutes Wrist AROM; flexion/extension, RD/UD, pronation/supination; reviewed for HEP  Cold pack (unbilled) - for anti-inflammatory and analgesic effect as needed for reduced pain and improved ability to participate in active PT intervention, along R shoulder in sitting, pillow propped under R forearm for upper limb support, x 5 minutes    PATIENT EDUCATION:  Education details: Plan of care Person educated: Patient Education method: Explanation Education comprehension: verbalized understanding     HOME EXERCISE PROGRAM: Access Code: [email or web address] URL: [email or web address] Date: 10/10/2022 Prepared by: Valentina Gu  Exercises - Flexion-Extension Shoulder Pendulum with Table Support  - 2 x daily - 7 x weekly - 3 sets - 10 reps - Seated Scapular Retraction  - 2 x daily - 7 x weekly - 2 sets - 10 reps - 3sec hold - Supine Shoulder Flexion Extension AAROM with Dowel  - 2 x daily - 7 x weekly - 2 sets - 10 reps - Supine Shoulder External Rotation in 45 Degrees Abduction AAROM with Dowel  - 2 x daily - 7 x weekly - 2 sets - 10 reps - Seated Shoulder Flexion AAROM with Pulley Behind  - 2 x daily - 7 x weekly - 2 sets - 10 reps     ASSESSMENT:   CLINICAL IMPRESSION: Patient demonstrates normalizing R shoulder flexion ROM; pt is still markedly limited with shoulder complex abduction and ER. We are continuing to work on attaining full shoulder ROM. Pt tolerates active ER against gravity (no resistance) without notable shoulder or periscapular pain. He is able to initiate moderate periscapular isotonics in standing without pain. Patient has ongoing post-operative impairments in R shoulder AROM and PROM, post-operative pain, decreased strength. Patient will benefit from continued skilled PT to address above impairments and improve  overall function.   REHAB POTENTIAL: Good  CLINICAL DECISION MAKING: Stable/uncomplicated   EVALUATION COMPLEXITY: Low     GOALS: Goals reviewed with patient? Yes   SHORT TERM GOALS: Target date: 09/14/2022   Pt will be independent with HEP to minimize adverse effects of immobilization and gently mobilize R shoulder to improve pain-free function at home and work. Baseline: 08/17/22: Baseline HEP initiated Goal status: INITIAL     LONG TERM GOALS: Target date: 12/07/22   Pt will increase FOTO to at least 56 to demonstrate significant improvement in function at home and work related to neck pain  Baseline: 08/17/22: 4 Goal status: INITIAL   2.  Pt will decrease worst shoulder pain by at least 3 points on the NPRS in order to demonstrate clinically significant reduction in shoulder pain. Baseline: 08/17/22: 5-6/10 at worst over last week; 09/23/22: 2/10 Goal status: INITIAL   3.   Pt will have R shoulder AROM at least within 10 degrees of contralateral upper extremity indicative of improved ROM as needed for reaching, overhead work, self-care activities/dressing/grooming Baseline: 08/17/22: NO AROM presently, severely limited PROM in early post-op phase; 09/23/22: AROM not appropriate yet at this point post-operatively Goal status: INITIAL   4.   Pt will have R shoulder MMTs at 4+/5 or greater for all directions performed indicative of improved strength as needed for completion of daily lifting, reaching, carrying tasks (e.g. 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   Pt will perform unilateral farmer's carry with 15 lbs for 100 ft without reproduction of pain simulating carrying items in [employer] including carrying trash bag  Baseline: 08/17/22: Unable to perform lifting/carrying; 09/23/22: unable Goal status:  INITIAL     PLAN: PT FREQUENCY: 1-2x/week   PT DURATION: 12 weeks   PLANNED INTERVENTIONS: Therapeutic exercises, Therapeutic activity, Neuromuscular re-education, Balance training, Gait training, Patient/Family education,  Joint mobilization, Electrical stimulation, Cryotherapy, Moist heat, and Manual therapy   PLAN FOR NEXT SESSION: Progress to full R shoulder ROM as tolerated. Continue with R shoulder AA/AROM and gradually progress to light isotonics as tolerated. Continue periscapular isotonics.     Valentina Gu, PT, DPT #[id number]  Eilleen Kempf, PT 11/05/2022, 2:12 PM

## 2022-11-10 ENCOUNTER — Encounter: Payer: Medicare Other | Admitting: Physical Therapy

## 2022-11-10 ENCOUNTER — Ambulatory Visit: Payer: Medicare Other | Admitting: Physical Therapy

## 2022-11-10 DIAGNOSIS — M6281 Muscle weakness (generalized): Secondary | ICD-10-CM

## 2022-11-10 DIAGNOSIS — M25511 Pain in right shoulder: Secondary | ICD-10-CM | POA: Diagnosis not present

## 2022-11-10 DIAGNOSIS — M25611 Stiffness of right shoulder, not elsewhere classified: Secondary | ICD-10-CM

## 2022-11-10 NOTE — Therapy (Signed)
OUTPATIENT PHYSICAL THERAPY TREATMENT NOTE   Patient Name: Shawn Atkins MRN: [id number] DOB:15-Dec-1955, 67 y.o., male Today's Date: 11/12/2022  PCP: Lynnell Jude, MD REFERRING PROVIDER: [name], MD  END OF SESSION:   PT End of Session - 11/12/22 1254     Visit Number 18    Number of Visits 30    Date for PT Re-Evaluation 12/07/22    Authorization Type [employer] Medicare, VL based on medical necessity    Authorization Time Period 08/18/23-10/26/22    Progress Note Due on Visit 10    PT Start Time 1353    PT Stop Time 1435    PT Time Calculation (min) 42 min    Activity Tolerance Patient limited by pain;Patient tolerated treatment well    Behavior During Therapy WFL for tasks assessed/performed                  Past Medical History:  Diagnosis Date   BPH (benign prostatic hypertrophy)    Bronchitis    Chronic prostatitis    Complication of anesthesia    Felt like "couldn't breathe" after triple Hernia surgery   Diabetes mellitus without complication (HCC)    Flank pain    GERD (gastroesophageal reflux disease)    h/o   Gross hematuria    History of kidney stones    HTN (hypertension)    pt states he takes lisinopril for kidney protection due to dm not htn   Inguinal hernia    Nocturia    OSA on CPAP    with 02 2 L   Overweight    PONV (postoperative nausea and vomiting)    only during kidney stone surgery   Spermatocele    Stricture of urethra    Vertigo    1 episode, 6-7 yrs ago   Past Surgical History:  Procedure Laterality Date   BICEPT TENODESIS Right 08/06/2022   Procedure: BICEPS TENODESIS;  Surgeon: [name], MD;  Location: [facility];  Service: Orthopedics;  Laterality: Right;   CATARACT EXTRACTION W/PHACO Left 07/22/2020   Procedure: CATARACT EXTRACTION PHACO AND INTRAOCULAR LENS PLACEMENT ([facility]) LEFT DIABETIC 4.14  00:33.0;  Surgeon: Eulogio Bear, MD;  Location: [place];  Service: Ophthalmology;  Laterality: Left;   Diabetic - oral meds   CATARACT EXTRACTION W/PHACO Right 08/12/2020   Procedure: CATARACT EXTRACTION PHACO AND INTRAOCULAR LENS PLACEMENT ([place]) RIGHT DIABETIC;  Surgeon: Eulogio Bear, MD;  Location: [place];  Service: Ophthalmology;  Laterality: Right;  1.99 0:26.2   COLONOSCOPY WITH PROPOFOL N/A 11/20/2015   Procedure: COLONOSCOPY WITH PROPOFOL;  Surgeon: Christene Lye, MD;  Location: [facility];  Service: Endoscopy;  Laterality: N/A;   HERNIA REPAIR Bilateral 6256   umbilical and bil inguinal/ Dr Marina Gravel   KIDNEY STONE SURGERY     KNEE ARTHROSCOPY Left    open lithotomy     SHOULDER ARTHROSCOPY WITH OPEN ROTATOR CUFF REPAIR AND DISTAL CLAVICLE ACROMINECTOMY Right 08/06/2022   Procedure: SHOULDER ARTHROSCOPY WITH OPEN ROTATOR CUFF REPAIR AND DISTAL CLAVICLE ACROMINECTOMY;  Surgeon: [name], MD;  Location: [facility];  Service: Orthopedics;  Laterality: Right;   Patient Active Problem List   Diagnosis Date Noted   History of rectal bleeding 06/01/2018   Left groin pain 03/18/2018   Sacroiliac joint pain 01/13/2017   Abdominal pain, left lower quadrant 08/23/2015   BPH with obstruction/lower urinary tract symptoms 08/23/2015   Biceps tendinitis 06/25/2015    REFERRING DIAG: M75.101 Unspecified rotator cuff tear  or rupture of right shoulder, not specified as traumatic  THERAPY DIAG:  Acute pain of right shoulder  Stiffness of right shoulder, not elsewhere classified  Muscle weakness (generalized)  Rationale for Evaluation and Treatment Rehabilitation   PERTINENT HISTORY: Pt is a 67 year old male s/p R shoulder rotator cuff repair and distal clavicle acromionectomy with Dr. Mack Guise on 08/06/22. Patient reports that his surgeon informed him he did have full tear of rotator cuff in surgery. Patient reports 4 days of severe pain just out of surgery. Patient reports managing pain well with Tylenol/Ibuprofen well now. Pt followed up with [facility] for  erythema and increased temperature as well as notable edema; ruled out infection and DVT. Patient reports comorbid neck and R upper trap pain. Pt reports difficulty with sleep positioning due to comorbid neck pain.    Pain:  Pain Intensity: Present: 2-3/10, Best: 2/10, Worst: 5-6/10 Pain location: R shoulder along deltoid and biceps Pain Quality: steady pain, intense intermittently; sharp pain along ACJ that comes and goes Radiating: Yes , to anterior arm Numbness/Tingling: No Aggravating factors: moving his R arm Relieving factors: Ibuprofen, Tylenol, resting arm/arm propped on armrest  History of prior shoulder or neck/shoulder injury, pain, surgery, or therapy: Yes, L shoulder with hx of cortisone injections, Hx of neck injury/PT for his neck Falls: Has patient fallen in last 6 months? No Dominant hand: left Imaging: Yes , MRI pre-operatively Prior level of function: Independent Occupational demands: Pt works part-time with [employer] (20 hrs/week), pt out of work now on medical leave Hobbies: [employer] (personal history of cancer, chills/fever, night sweats, nausea, vomiting, unrelenting pain): Negative   Precautions: None   Weight Bearing Restrictions: No   Living Environment Lives with: lives with their spouse Lives in: House/apartment     Patient Goals: Able to pick up weight with his arm, fish; able to play with baseball/bat with his grandson    PRECAUTIONS: No R shoulder AROM, no RUE lifting   DOS: 08/06/22, s/p R large RTC repair, R distal acromionectomy -8 weeks post-op 10/01/22 -12 weeks post-op 10/29/22   SUBJECTIVE:                                                                                                                                                                                      SUBJECTIVE STATEMENT:  Patient reports moderate discomfort in R upper trapezius and intermittently along R proximal arm. He reports some soreness after last visit that  improved following use of ice. Pt is compliant with HEP.    PAIN:  Are you having pain? Yes: NPRS scale: 1-2/10 pain at arrival  Pain location: pain into R paracervical/upper trap  region mainly     OBJECTIVE: (objective measures completed at initial evaluation unless otherwise dated)     Patient Surveys  FOTO: 4, predicted outcome score of 83     Cognition Patient is oriented to person, place, and time.  Recent memory is intact.  Remote memory is intact.  Attention span and concentration are intact.  Expressive speech is intact.  Patient's fund of knowledge is within normal limits for educational level.                          Gross Musculoskeletal Assessment Tremor: None Bulk: Atrophy of deltoid/posterior cuff mm Tone: Normal   Observation No sign of acute infection, mild erythema along R upper arm, mild increased tissue temperature along R middle deltoid         Posture FHRS posture     AROM       AROM (Normal range in degrees) AROM 08/17/2022    Right Left  Shoulder      Flexion Deferred    Extension Deferred    Abduction Deferred    External Rotation Deferred    Internal Rotation Deferred    Hands Behind Head Deferred    Hands Behind Back Deferred           Elbow      Flexion WNL    Extension WNL    Pronation 50    Supination 60    (* = pain; Blank rows = not tested)     PROM     PROM (Normal range in degrees) AROM 08/17/2022    Right Left  Shoulder      Flexion 40    Extension      Abduction      External Rotation (arm at side) To neutral/0 deg    Internal Rotation (arm at side) 45          LE MMT: Manual muscle tests deferred to later date, post-op restrictions MMT (out of 5) Right 08/17/2022 Left 08/17/2022         Shoulder   Flexion      Extension      Abduction      External rotation      Internal rotation      Horizontal abduction      Horizontal adduction      Lower Trapezius      Rhomboids             Elbow   Flexion      Extension      Pronation      Supination             Wrist  Flexion      Extension      Radial deviation      Ulnar deviation      (* = pain; Blank rows = not tested)     Sensation Deferred   Reflexes Deferred     Palpation   Location LEFT  RIGHT           Subocciptials      Cervical paraspinals      Upper Trapezius      Levator Scapulae      Rhomboid Major/Minor      Sternoclavicular joint      Acromioclavicular joint   2  Coracoid process   1  Long head of biceps   1  Supraspinatus   2  Infraspinatus   1  Subscapularis  Teres Minor      Teres Major      Pectoralis Major      Pectoralis Minor      Anterior Deltoid   1  Lateral Deltoid   2  Posterior Deltoid      Latissimus Dorsi      Sternocleidomastoid      (Blank rows = not tested) Graded on 0-4 scale (0 = no pain, 1 = pain, 2 = pain with wincing/grimacing/flinching, 3 = pain with withdrawal, 4 = unwilling to allow palpation), (Blank rows = not tested)       TODAY'S TREATMENT   Manual Therapy - for R shoulder ROM and to prevent R shoulder stiffness; manual perturbations for re-training of stabilizers of GHJ    R shoulder PROM flexion, abduction, ER/IR x 10 minutes Gentle STM to R anterior and middle deltoid, biceps, upper trapezius; x 2 minutes DTM/TPR R UT; x 3 minutes   *next visit* Rhythmic stabilization; arm at 100 deg forward elevation; 1 x 2 minutes (moderate perturbations)  *not today* DTM/Tpr R upper trapezius; x 3 minutes     Therapeutic Exercise - for shoulder ROM as needed for reaching and grasping tasks, self-care ADLs, and household chores  Supine flexion AROM; 2x10  Sidelying ER AROM; 2x10, verbal and tactile cueing for technique  Wand ER AAROM at 60 deg abduction, in supine; 2x10  Serratus slide, at wall; 2x10, 5 sec hold at top  Pulleys; shoulder complex abduction ; x 2 minutes    *not today* Standing Tband row, Green Tband; 2x12 Wand ER AAROM;  2x10 Table slide PROM Flexion x15 Table slide PROM Abd x15 Gripping; pink Theraputty; composite fist, performed x 2 minutes Wrist AROM; flexion/extension, RD/UD, pronation/supination; reviewed for HEP    Cold pack (unbilled) - for anti-inflammatory and analgesic effect as needed for reduced pain and improved ability to participate in active PT intervention, along R shoulder in sitting, pillow propped under R forearm for upper limb support, x 5 minutes    PATIENT EDUCATION:  Education details: Plan of care Person educated: Patient Education method: Explanation Education comprehension: verbalized understanding     HOME EXERCISE PROGRAM: Access Code: [email or web address] URL: [email or web address] Date: 10/10/2022 Prepared by: Valentina Gu  Exercises - Flexion-Extension Shoulder Pendulum with Table Support  - 2 x daily - 7 x weekly - 3 sets - 10 reps - Seated Scapular Retraction  - 2 x daily - 7 x weekly - 2 sets - 10 reps - 3sec hold - Supine Shoulder Flexion Extension AAROM with Dowel  - 2 x daily - 7 x weekly - 2 sets - 10 reps - Supine Shoulder External Rotation in 45 Degrees Abduction AAROM with Dowel  - 2 x daily - 7 x weekly - 2 sets - 10 reps - Seated Shoulder Flexion AAROM with Pulley Behind  - 2 x daily - 7 x weekly - 2 sets - 10 reps     ASSESSMENT:   CLINICAL IMPRESSION: Patient has shortened session due to late arrival time. Pt exhibits ongoing motion deficits in shoulder complex abduction and ER. He has minor deficit remaining with R shoulder forward elevation. Pt does have R UT and paracervical pain associated with comorbid cervical radiculopathy that does limit participation with active PT. This has improved following recent injection. Patient has ongoing post-operative impairments in R shoulder AROM and PROM, post-operative pain, decreased strength. Patient will benefit from continued skilled PT to address above impairments and improve overall function.   REHAB  POTENTIAL: Good   CLINICAL DECISION MAKING: Stable/uncomplicated   EVALUATION COMPLEXITY: Low     GOALS: Goals reviewed with patient? Yes   SHORT TERM GOALS: Target date: 09/14/2022   Pt will be independent with HEP to minimize adverse effects of immobilization and gently mobilize R shoulder to improve pain-free function at home and work. Baseline: 08/17/22: Baseline HEP initiated Goal status: INITIAL     LONG TERM GOALS: Target date: 12/07/22   Pt will increase FOTO to at least 56 to demonstrate significant improvement in function at home and work related to neck pain  Baseline: 08/17/22: 4 Goal status: INITIAL   2.  Pt will decrease worst shoulder pain by at least 3 points on the NPRS in order to demonstrate clinically significant reduction in shoulder pain. Baseline: 08/17/22: 5-6/10 at worst over last week; 09/23/22: 2/10 Goal status: INITIAL   3.   Pt will have R shoulder AROM at least within 10 degrees of contralateral upper extremity indicative of improved ROM as needed for reaching, overhead work, self-care activities/dressing/grooming Baseline: 08/17/22: NO AROM presently, severely limited PROM in early post-op phase; 09/23/22: AROM not appropriate yet at this point post-operatively Goal status: INITIAL   4.   Pt will have R shoulder MMTs at 4+/5 or greater for all directions performed indicative of improved strength as needed for completion of daily lifting, reaching, carrying tasks (e.g. 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   Pt will perform unilateral farmer's carry with 15 lbs for 100 ft without reproduction of pain simulating carrying items in [employer] including carrying trash bag  Baseline: 08/17/22: Unable to perform lifting/carrying; 09/23/22: unable Goal status: INITIAL     PLAN: PT FREQUENCY:  1-2x/week   PT DURATION: 12 weeks   PLANNED INTERVENTIONS: Therapeutic exercises, Therapeutic activity, Neuromuscular re-education, Balance training, Gait training, Patient/Family education,  Joint mobilization, Electrical stimulation, Cryotherapy, Moist heat, and Manual therapy   PLAN FOR NEXT SESSION: Progress to full R shoulder ROM as tolerated. Continue with R shoulder AA/AROM and gradually progress to light isotonics as tolerated. Continue periscapular isotonics.     Valentina Gu, PT, DPT #[id number]  Eilleen Kempf, PT 11/12/2022, 12:55 PM

## 2022-11-12 ENCOUNTER — Ambulatory Visit: Payer: Medicare Other | Admitting: Physical Therapy

## 2022-11-12 DIAGNOSIS — M25511 Pain in right shoulder: Secondary | ICD-10-CM | POA: Diagnosis not present

## 2022-11-12 DIAGNOSIS — M25611 Stiffness of right shoulder, not elsewhere classified: Secondary | ICD-10-CM

## 2022-11-12 DIAGNOSIS — M6281 Muscle weakness (generalized): Secondary | ICD-10-CM

## 2022-11-12 NOTE — Therapy (Signed)
OUTPATIENT PHYSICAL THERAPY TREATMENT NOTE   Patient Name: Shawn Atkins MRN: [id number] DOB:1955-11-05, 67 y.o., male Today's Date: 11/17/2022  PCP: Lynnell Jude, MD REFERRING PROVIDER: [name], MD  END OF SESSION:   PT End of Session - 11/17/22 0831     Visit Number 19    Number of Visits 30    Date for PT Re-Evaluation 12/07/22    Authorization Type [employer] Medicare, VL based on medical necessity    Authorization Time Period 08/18/23-10/26/22    Progress Note Due on Visit 10    PT Start Time 1345    PT Stop Time 1435    PT Time Calculation (min) 50 min    Activity Tolerance Patient limited by pain;Patient tolerated treatment well    Behavior During Therapy WFL for tasks assessed/performed              Past Medical History:  Diagnosis Date   BPH (benign prostatic hypertrophy)    Bronchitis    Chronic prostatitis    Complication of anesthesia    Felt like "couldn't breathe" after triple Hernia surgery   Diabetes mellitus without complication (HCC)    Flank pain    GERD (gastroesophageal reflux disease)    h/o   Gross hematuria    History of kidney stones    HTN (hypertension)    pt states he takes lisinopril for kidney protection due to dm not htn   Inguinal hernia    Nocturia    OSA on CPAP    with 02 2 L   Overweight    PONV (postoperative nausea and vomiting)    only during kidney stone surgery   Spermatocele    Stricture of urethra    Vertigo    1 episode, 6-7 yrs ago   Past Surgical History:  Procedure Laterality Date   BICEPT TENODESIS Right 08/06/2022   Procedure: BICEPS TENODESIS;  Surgeon: [name], MD;  Location: [facility];  Service: Orthopedics;  Laterality: Right;   CATARACT EXTRACTION W/PHACO Left 07/22/2020   Procedure: CATARACT EXTRACTION PHACO AND INTRAOCULAR LENS PLACEMENT (IOC) LEFT DIABETIC 4.14  00:33.0;  Surgeon: Eulogio Bear, MD;  Location: [place];  Service: Ophthalmology;  Laterality: Left;  Diabetic  - oral meds   CATARACT EXTRACTION W/PHACO Right 08/12/2020   Procedure: CATARACT EXTRACTION PHACO AND INTRAOCULAR LENS PLACEMENT (Fate) RIGHT DIABETIC;  Surgeon: Eulogio Bear, MD;  Location: [place];  Service: Ophthalmology;  Laterality: Right;  1.99 0:26.2   COLONOSCOPY WITH PROPOFOL N/A 11/20/2015   Procedure: COLONOSCOPY WITH PROPOFOL;  Surgeon: Christene Lye, MD;  Location: [facility];  Service: Endoscopy;  Laterality: N/A;   HERNIA REPAIR Bilateral 4098   umbilical and bil inguinal/ Dr Marina Gravel   KIDNEY STONE SURGERY     KNEE ARTHROSCOPY Left    open lithotomy     SHOULDER ARTHROSCOPY WITH OPEN ROTATOR CUFF REPAIR AND DISTAL CLAVICLE ACROMINECTOMY Right 08/06/2022   Procedure: SHOULDER ARTHROSCOPY WITH OPEN ROTATOR CUFF REPAIR AND DISTAL CLAVICLE ACROMINECTOMY;  Surgeon: [name], MD;  Location: [facility];  Service: Orthopedics;  Laterality: Right;   Patient Active Problem List   Diagnosis Date Noted   History of rectal bleeding 06/01/2018   Left groin pain 03/18/2018   Sacroiliac joint pain 01/13/2017   Abdominal pain, left lower quadrant 08/23/2015   BPH with obstruction/lower urinary tract symptoms 08/23/2015   Biceps tendinitis 06/25/2015    REFERRING DIAG: M75.101 Unspecified rotator cuff tear or rupture of right  shoulder, not specified as traumatic  THERAPY DIAG:  Acute pain of right shoulder  Stiffness of right shoulder, not elsewhere classified  Muscle weakness (generalized)  Rationale for Evaluation and Treatment Rehabilitation   PERTINENT HISTORY: Pt is a 67 year old male s/p R shoulder rotator cuff repair and distal clavicle acromionectomy with Dr. Mack Guise on 08/06/22. Patient reports that his surgeon informed him he did have full tear of rotator cuff in surgery. Patient reports 4 days of severe pain just out of surgery. Patient reports managing pain well with Tylenol/Ibuprofen well now. Pt followed up with [facility] for erythema  and increased temperature as well as notable edema; ruled out infection and DVT. Patient reports comorbid neck and R upper trap pain. Pt reports difficulty with sleep positioning due to comorbid neck pain.    Pain:  Pain Intensity: Present: 2-3/10, Best: 2/10, Worst: 5-6/10 Pain location: R shoulder along deltoid and biceps Pain Quality: steady pain, intense intermittently; sharp pain along ACJ that comes and goes Radiating: Yes , to anterior arm Numbness/Tingling: No Aggravating factors: moving his R arm Relieving factors: Ibuprofen, Tylenol, resting arm/arm propped on armrest  History of prior shoulder or neck/shoulder injury, pain, surgery, or therapy: Yes, L shoulder with hx of cortisone injections, Hx of neck injury/PT for his neck Falls: Has patient fallen in last 6 months? No Dominant hand: left Imaging: Yes , MRI pre-operatively Prior level of function: Independent Occupational demands: Pt works part-time with [employer] (20 hrs/week), pt out of work now on medical leave Hobbies: [employer] (personal history of cancer, chills/fever, night sweats, nausea, vomiting, unrelenting pain): Negative   Precautions: None   Weight Bearing Restrictions: No   Living Environment Lives with: lives with their spouse Lives in: House/apartment     Patient Goals: Able to pick up weight with his arm, fish; able to play with baseball/bat with his grandson    PRECAUTIONS: No R shoulder AROM, no RUE lifting   DOS: 08/06/22, s/p R large RTC repair, R distal acromionectomy -8 weeks post-op 10/01/22 -12 weeks post-op 10/29/22   SUBJECTIVE:                                                                                                                                                                                      SUBJECTIVE STATEMENT:  Patient reports he is unsure if injection is still taking effect for cervical spine. Pt reports some discomfort in low back and in R arm after using his new  vacuum at home. He reports some soreness after last visit, but he feels he tolerated last visit well.    PAIN:  Are you having pain? Yes: NPRS scale: 2-3/10  pain at arrival  Pain location: pain into R paracervical/upper trap region mainly     OBJECTIVE: (objective measures completed at initial evaluation unless otherwise dated)     Patient Surveys  FOTO: 4, predicted outcome score of 56     Cognition Patient is oriented to person, place, and time.  Recent memory is intact.  Remote memory is intact.  Attention span and concentration are intact.  Expressive speech is intact.  Patient's fund of knowledge is within normal limits for educational level.                          Gross Musculoskeletal Assessment Tremor: None Bulk: Atrophy of deltoid/posterior cuff mm Tone: Normal   Observation No sign of acute infection, mild erythema along R upper arm, mild increased tissue temperature along R middle deltoid         Posture FHRS posture     AROM       AROM (Normal range in degrees) AROM 08/17/2022    Right Left  Shoulder      Flexion Deferred    Extension Deferred    Abduction Deferred    External Rotation Deferred    Internal Rotation Deferred    Hands Behind Head Deferred    Hands Behind Back Deferred           Elbow      Flexion WNL    Extension WNL    Pronation 50    Supination 60    (* = pain; Blank rows = not tested)     PROM     PROM (Normal range in degrees) AROM 08/17/2022    Right Left  Shoulder      Flexion 40    Extension      Abduction      External Rotation (arm at side) To neutral/0 deg    Internal Rotation (arm at side) 45          LE MMT: Manual muscle tests deferred to later date, post-op restrictions MMT (out of 5) Right 08/17/2022 Left 08/17/2022         Shoulder   Flexion      Extension      Abduction      External rotation      Internal rotation      Horizontal abduction      Horizontal adduction      Lower  Trapezius      Rhomboids             Elbow  Flexion      Extension      Pronation      Supination             Wrist  Flexion      Extension      Radial deviation      Ulnar deviation      (* = pain; Blank rows = not tested)     Sensation Deferred   Reflexes Deferred     Palpation   Location LEFT  RIGHT           Subocciptials      Cervical paraspinals      Upper Trapezius      Levator Scapulae      Rhomboid Major/Minor      Sternoclavicular joint      Acromioclavicular joint   2  Coracoid process   1  Long head of biceps   1  Supraspinatus   2  Infraspinatus   1  Subscapularis      Teres Minor      Teres Major      Pectoralis Major      Pectoralis Minor      Anterior Deltoid   1  Lateral Deltoid   2  Posterior Deltoid      Latissimus Dorsi      Sternocleidomastoid      (Blank rows = not tested) Graded on 0-4 scale (0 = no pain, 1 = pain, 2 = pain with wincing/grimacing/flinching, 3 = pain with withdrawal, 4 = unwilling to allow palpation), (Blank rows = not tested)       TODAY'S TREATMENT   Manual Therapy - for R shoulder ROM and to prevent R shoulder stiffness; manual perturbations for re-training of stabilizers of GHJ    R shoulder PROM flexion, abduction, ER/IR x 10 minutes Gentle STM to R anterior and middle deltoid, biceps, upper trapezius; x 2 minutes DTM/TPR R UT; x 3 minutes   *next visit* Rhythmic stabilization; arm at 100 deg forward elevation; 1 x 2 minutes (moderate perturbations)  *not today* DTM/Tpr R upper trapezius; x 3 minutes     Therapeutic Exercise - for shoulder ROM as needed for reaching and grasping tasks, self-care ADLs, and household chores  Supine flexion AROM; 2x10  Wand ER with upper arm propped on handrail, 90 deg scaption, in standing; 2x10  Serratus slide, at wall on foam roller; 2x10, 5 sec hold at top  Pulleys; shoulder complex abduction ; x 2 minutes  Finger ladder, x10, 5 sec hold at top for forward  flexion     *not today* Sidelying ER AROM; 2x10, verbal and tactile cueing for technique Standing Tband row, Green Tband; 2x12 Wand ER AAROM; 2x10 Table slide PROM Flexion x15 Table slide PROM Abd x15 Gripping; pink Theraputty; composite fist, performed x 2 minutes Wrist AROM; flexion/extension, RD/UD, pronation/supination; reviewed for HEP    Cold pack (unbilled) - for anti-inflammatory and analgesic effect as needed for reduced pain and improved ability to participate in active PT intervention, along R shoulder in sitting, pillow propped under R forearm for upper limb support, x 5 minutes    PATIENT EDUCATION:  Education details: Plan of care Person educated: Patient Education method: Explanation Education comprehension: verbalized understanding     HOME EXERCISE PROGRAM: Access Code: [email or web address] URL: [email or web address] Date: 10/10/2022 Prepared by: Valentina Gu  Exercises - Flexion-Extension Shoulder Pendulum with Table Support  - 2 x daily - 7 x weekly - 3 sets - 10 reps - Seated Scapular Retraction  - 2 x daily - 7 x weekly - 2 sets - 10 reps - 3sec hold - Supine Shoulder Flexion Extension AAROM with Dowel  - 2 x daily - 7 x weekly - 2 sets - 10 reps - Supine Shoulder External Rotation in 45 Degrees Abduction AAROM with Dowel  - 2 x daily - 7 x weekly - 2 sets - 10 reps - Seated Shoulder Flexion AAROM with Pulley Behind  - 2 x daily - 7 x weekly - 2 sets - 10 reps     ASSESSMENT:   CLINICAL IMPRESSION: Patient demonstrates ongoing deficits with shoulder complex abduction and ER. Pt is able to attain near within functional limits ROM with wand ER with R shoulder in about 90 deg scaption. ROM is continuing to progress, but he still does have motion loss given limited PT intervention in last month due to comorbid neck pain  and cervical spine referred pain to R radial styloid/thumb that was at least partially mitigated with recent injections. We will  progress with strengthening as AROM continues to improve. Patient has ongoing post-operative impairments in R shoulder AROM and PROM, post-operative pain, decreased strength. Patient will benefit from continued skilled PT to address above impairments and improve overall function.   REHAB POTENTIAL: Good   CLINICAL DECISION MAKING: Stable/uncomplicated   EVALUATION COMPLEXITY: Low     GOALS: Goals reviewed with patient? Yes   SHORT TERM GOALS: Target date: 09/14/2022   Pt will be independent with HEP to minimize adverse effects of immobilization and gently mobilize R shoulder to improve pain-free function at home and work. Baseline: 08/17/22: Baseline HEP initiated Goal status: INITIAL     LONG TERM GOALS: Target date: 12/07/22   Pt will increase FOTO to at least 56 to demonstrate significant improvement in function at home and work related to neck pain  Baseline: 08/17/22: 4 Goal status: INITIAL   2.  Pt will decrease worst shoulder pain by at least 3 points on the NPRS in order to demonstrate clinically significant reduction in shoulder pain. Baseline: 08/17/22: 5-6/10 at worst over last week; 09/23/22: 2/10 Goal status: INITIAL   3.   Pt will have R shoulder AROM at least within 10 degrees of contralateral upper extremity indicative of improved ROM as needed for reaching, overhead work, self-care activities/dressing/grooming Baseline: 08/17/22: NO AROM presently, severely limited PROM in early post-op phase; 09/23/22: AROM not appropriate yet at this point post-operatively Goal status: INITIAL   4.   Pt will have R shoulder MMTs at 4+/5 or greater for all directions performed indicative of improved strength as needed for completion of daily lifting, reaching, carrying tasks (e.g. 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   Pt will perform unilateral farmer's carry with 15 lbs for 100 ft without reproduction of pain simulating carrying items in [employer] including carrying trash bag  Baseline: 08/17/22: Unable to perform lifting/carrying; 09/23/22: unable Goal status: INITIAL     PLAN: PT FREQUENCY: 1-2x/week   PT DURATION: 12 weeks   PLANNED INTERVENTIONS: Therapeutic exercises, Therapeutic activity, Neuromuscular re-education, Balance training, Gait training, Patient/Family education,  Joint mobilization, Electrical stimulation, Cryotherapy, Moist heat, and Manual therapy   PLAN FOR NEXT SESSION: Progress to full R shoulder ROM as tolerated. Continue with R shoulder AA/AROM and gradually progress to light RTC isotonics as tolerated. Continue periscapular isotonics.     Valentina Gu, PT, DPT #[id number]  Eilleen Kempf, PT 11/17/2022, 8:32 AM

## 2022-11-17 ENCOUNTER — Encounter: Payer: Self-pay | Admitting: Physical Therapy

## 2022-11-17 ENCOUNTER — Ambulatory Visit: Payer: Medicare Other | Admitting: Physical Therapy

## 2022-11-17 DIAGNOSIS — M25511 Pain in right shoulder: Secondary | ICD-10-CM | POA: Diagnosis not present

## 2022-11-17 DIAGNOSIS — M6281 Muscle weakness (generalized): Secondary | ICD-10-CM

## 2022-11-17 DIAGNOSIS — M25611 Stiffness of right shoulder, not elsewhere classified: Secondary | ICD-10-CM

## 2022-11-17 NOTE — Therapy (Unsigned)
OUTPATIENT PHYSICAL THERAPY TREATMENT AND PROGRESS NOTE   Dates of reporting period  09/23/22   to   11/17/22    Patient Name: Shawn Atkins MRN: [id number] DOB:[birth year], 67 y.o., male Today's Date: 11/17/2022  PCP: Lynnell Jude, MD REFERRING PROVIDER: [name], MD  END OF SESSION:   PT End of Session - 11/17/22 1351     Visit Number 20    Number of Visits 30    Date for PT Re-Evaluation 12/07/22    Authorization Type [employer] Medicare, VL based on medical necessity    Authorization Time Period 08/18/23-10/26/22    Progress Note Due on Visit 10    PT Start Time 2951    PT Stop Time 1430    PT Time Calculation (min) 43 min    Activity Tolerance Patient limited by pain;Patient tolerated treatment well    Behavior During Therapy WFL for tasks assessed/performed               Past Medical History:  Diagnosis Date   BPH (benign prostatic hypertrophy)    Bronchitis    Chronic prostatitis    Complication of anesthesia    Felt like "couldn't breathe" after triple Hernia surgery   Diabetes mellitus without complication (HCC)    Flank pain    GERD (gastroesophageal reflux disease)    h/o   Gross hematuria    History of kidney stones    HTN (hypertension)    pt states he takes lisinopril for kidney protection due to dm not htn   Inguinal hernia    Nocturia    OSA on CPAP    with 02 2 L   Overweight    PONV (postoperative nausea and vomiting)    only during kidney stone surgery   Spermatocele    Stricture of urethra    Vertigo    1 episode, 6-7 yrs ago   Past Surgical History:  Procedure Laterality Date   BICEPT TENODESIS Right 08/06/2022   Procedure: BICEPS TENODESIS;  Surgeon: [name], MD;  Location: [facility];  Service: Orthopedics;  Laterality: Right;   CATARACT EXTRACTION W/PHACO Left 07/22/2020   Procedure: CATARACT EXTRACTION PHACO AND INTRAOCULAR LENS PLACEMENT ([facility]) LEFT DIABETIC 4.14  00:33.0;  Surgeon: Eulogio Bear, MD;  Location:  [place];  Service: Ophthalmology;  Laterality: Left;  Diabetic - oral meds   CATARACT EXTRACTION W/PHACO Right 08/12/2020   Procedure: CATARACT EXTRACTION PHACO AND INTRAOCULAR LENS PLACEMENT ([place]) RIGHT DIABETIC;  Surgeon: Eulogio Bear, MD;  Location: [facility] [place];  Service: Ophthalmology;  Laterality: Right;  1.99 0:26.2   COLONOSCOPY WITH PROPOFOL N/A 11/20/2015   Procedure: COLONOSCOPY WITH PROPOFOL;  Surgeon: Christene Lye, MD;  Location: [facility];  Service: Endoscopy;  Laterality: N/A;   HERNIA REPAIR Bilateral 8841   umbilical and bil inguinal/ Dr Marina Gravel   KIDNEY STONE SURGERY     KNEE ARTHROSCOPY Left    open lithotomy     SHOULDER ARTHROSCOPY WITH OPEN ROTATOR CUFF REPAIR AND DISTAL CLAVICLE ACROMINECTOMY Right 08/06/2022   Procedure: SHOULDER ARTHROSCOPY WITH OPEN ROTATOR CUFF REPAIR AND DISTAL CLAVICLE ACROMINECTOMY;  Surgeon: [name], MD;  Location: [facility];  Service: Orthopedics;  Laterality: Right;   Patient Active Problem List   Diagnosis Date Noted   History of rectal bleeding 06/01/2018   Left groin pain 03/18/2018   Sacroiliac joint pain 01/13/2017   Abdominal pain, left lower quadrant 08/23/2015   BPH with obstruction/lower urinary tract symptoms 08/23/2015  Biceps tendinitis 06/25/2015    REFERRING DIAG: M75.101 Unspecified rotator cuff tear or rupture of right shoulder, not specified as traumatic  THERAPY DIAG:  Acute pain of right shoulder  Stiffness of right shoulder, not elsewhere classified  Muscle weakness (generalized)  Rationale for Evaluation and Treatment Rehabilitation   PERTINENT HISTORY: Pt is a 67 year old male s/p R shoulder rotator cuff repair and distal clavicle acromionectomy with Dr. Mack Guise on 08/06/22. Patient reports that his surgeon informed him he did have full tear of rotator cuff in surgery. Patient reports 4 days of severe pain just out of surgery. Patient reports managing pain well  with Tylenol/Ibuprofen well now. Pt followed up with [facility] for erythema and increased temperature as well as notable edema; ruled out infection and DVT. Patient reports comorbid neck and R upper trap pain. Pt reports difficulty with sleep positioning due to comorbid neck pain.    Pain:  Pain Intensity: Present: 2-3/10, Best: 2/10, Worst: 5-6/10 Pain location: R shoulder along deltoid and biceps Pain Quality: steady pain, intense intermittently; sharp pain along ACJ that comes and goes Radiating: Yes , to anterior arm Numbness/Tingling: No Aggravating factors: moving his R arm Relieving factors: Ibuprofen, Tylenol, resting arm/arm propped on armrest  History of prior shoulder or neck/shoulder injury, pain, surgery, or therapy: Yes, L shoulder with hx of cortisone injections, Hx of neck injury/PT for his neck Falls: Has patient fallen in last 6 months? No Dominant hand: left Imaging: Yes , MRI pre-operatively Prior level of function: Independent Occupational demands: Pt works part-time with [employer] (20 hrs/week), pt out of work now on medical leave Hobbies: [employer] (personal history of cancer, chills/fever, night sweats, nausea, vomiting, unrelenting pain): Negative   Precautions: None   Weight Bearing Restrictions: No   Living Environment Lives with: lives with their spouse Lives in: House/apartment     Patient Goals: Able to pick up weight with his arm, fish; able to play with baseball/bat with his grandson    PRECAUTIONS: No R shoulder AROM, no RUE lifting   DOS: 08/06/22, s/p R large RTC repair, R distal acromionectomy -8 weeks post-op 10/01/22 -12 weeks post-op 10/29/22   SUBJECTIVE:                                                                                                                                                                                      SUBJECTIVE STATEMENT:  Patient had f/u with MD this week regarding cervicalgia/cervical radiculopathy.  Pt reports that the MD discussed referring to PT for cervical radiculopathy. Pt reports fair progress with post-op rehab. 70% improvement to date. Patient reports doing well with dressing, bathing, grooming, self-care ADLs.  Patient reports he is uncomfortable at this time with lifting weight. Pt reports doing well with forward and upward reaching.    PAIN:  Are you having pain? Yes: NPRS scale: 2-3/10 pain at arrival  Pain location: pain into R paracervical/upper trap region mainly     OBJECTIVE: (objective measures completed at initial evaluation unless otherwise dated)     Patient Surveys  FOTO: 4, predicted outcome score of 56     Cognition Patient is oriented to person, place, and time.  Recent memory is intact.  Remote memory is intact.  Attention span and concentration are intact.  Expressive speech is intact.  Patient's fund of knowledge is within normal limits for educational level.                          Gross Musculoskeletal Assessment Tremor: None Bulk: Atrophy of deltoid/posterior cuff mm Tone: Normal   Observation No sign of acute infection, mild erythema along R upper arm, mild increased tissue temperature along R middle deltoid         Posture FHRS posture     AROM         AROM (Normal range in degrees) AROM 08/17/2022 AROM 11/17/2022    Right Left Right Left  Shoulder        Flexion Deferred   125 155  Extension Deferred      Abduction Deferred   128 165  External Rotation Deferred   80 WNL  Internal Rotation Deferred   50 WNL  Hands Behind Head Deferred   C7 T3  Hands Behind Back Deferred   L3 T12           Elbow        Flexion WNL      Extension WNL      Pronation 50      Supination 60      (* = pain; Blank rows = not tested)     PROM       PROM (Normal range in degrees) PROM 08/17/2022 PROM 11/17/22    Right Left Right Left  Shoulder        Flexion 40    160  Extension        Abduction     108 WNL  External Rotation (arm at  side) To neutral/0 deg    WNL  Internal Rotation (arm at side) 45    WNL        UE MMT:  MMT (out of 5) Right 11/17/2022 Left 11/17/2022         Shoulder   Flexion 3+  5   Extension      Abduction 4-   4+  External rotation  4-  4+  Internal rotation  4 5   Horizontal abduction      Horizontal adduction      Lower Trapezius      Rhomboids             Elbow  Flexion  4+ 5  Extension  4+  5  Pronation      Supination             Wrist  Flexion      Extension      Radial deviation      Ulnar deviation      (* = pain; Blank rows = not tested)     Sensation Deferred   Reflexes Deferred     Palpation  Location LEFT  RIGHT           Subocciptials      Cervical paraspinals      Upper Trapezius      Levator Scapulae      Rhomboid Major/Minor      Sternoclavicular joint      Acromioclavicular joint   2  Coracoid process   1  Long head of biceps   1  Supraspinatus   2  Infraspinatus   1  Subscapularis      Teres Minor      Teres Major      Pectoralis Major      Pectoralis Minor      Anterior Deltoid   1  Lateral Deltoid   2  Posterior Deltoid      Latissimus Dorsi      Sternocleidomastoid      (Blank rows = not tested) Graded on 0-4 scale (0 = no pain, 1 = pain, 2 = pain with wincing/grimacing/flinching, 3 = pain with withdrawal, 4 = unwilling to allow palpation), (Blank rows = not tested)       TODAY'S TREATMENT   Manual Therapy - for R shoulder ROM and to prevent R shoulder stiffness; manual perturbations for re-training of stabilizers of GHJ   R shoulder PROM flexion, abduction, ER/IR within pt tolerance with gentle overpressure as able x 15 minutes  *next visit* Rhythmic stabilization; arm at 100 deg forward elevation; 1 x 2 minutes (moderate perturbations)  *not today* Gentle STM to R anterior and middle deltoid, biceps, upper trapezius; x 2 minutes DTM/TPR R UT; x 3 minutes DTM/Tpr R upper trapezius; x 3 minutes     Therapeutic  Exercise - for shoulder ROM as needed for reaching and grasping tasks, self-care ADLs, and household chores   *GOAL UPDATE PERFORMED   *next visit* Supine flexion AROM; 2x10 Wand ER with upper arm propped on handrail, 90 deg scaption, in standing; 2x10 Serratus slide, at wall on foam roller; 2x10, 5 sec hold at top Pulleys; shoulder complex abduction ; x 2 minutes Finger ladder, x10, 5 sec hold at top for forward flexion   *not today* Sidelying ER AROM; 2x10, verbal and tactile cueing for technique Standing Tband row, Green Tband; 2x12 Wand ER AAROM; 2x10 Table slide PROM Flexion x15 Table slide PROM Abd x15 Gripping; pink Theraputty; composite fist, performed x 2 minutes Wrist AROM; flexion/extension, RD/UD, pronation/supination; reviewed for HEP    Cold pack (unbilled) - for anti-inflammatory and analgesic effect as needed for reduced pain and improved ability to participate in active PT intervention, along R shoulder in sitting, pillow propped under R forearm for upper limb support, x 5 minutes    PATIENT EDUCATION:  Education details: Plan of care Person educated: Patient Education method: Explanation Education comprehension: verbalized understanding     HOME EXERCISE PROGRAM: Access Code: [email or web address] URL: [email or web address] Date: 10/10/2022 Prepared by: Valentina Gu  Exercises - Flexion-Extension Shoulder Pendulum with Table Support  - 2 x daily - 7 x weekly - 3 sets - 10 reps - Seated Scapular Retraction  - 2 x daily - 7 x weekly - 2 sets - 10 reps - 3sec hold - Supine Shoulder Flexion Extension AAROM with Dowel  - 2 x daily - 7 x weekly - 2 sets - 10 reps - Supine Shoulder External Rotation in 45 Degrees Abduction AAROM with Dowel  - 2 x daily - 7 x weekly - 2 sets - 10 reps - Seated Shoulder Flexion  AAROM with Pulley Behind  - 2 x daily - 7 x weekly - 2 sets - 10 reps     ASSESSMENT:   CLINICAL IMPRESSION: Patient has comorbid cervical  radiculopathy with associated right upper quarter symptoms. Pain from cervical spine has improved s/p injections - 70% better per patient. We have not yet received referral for treatment for cervicalgia/cervical radiculopathy. Pt does still have remaining deficits in R shoulder ROM, strength, and functional use with regard to lifting/carrying/overhead work. Pt is able to reach upward and forward better at this time, but he still needs continued work on ROM. Patient has ongoing post-operative impairments in R shoulder AROM and PROM, post-operative pain, decreased strength, R shoulder/arm pain. Patient will benefit from continued skilled PT to address above impairments and improve overall function.   REHAB POTENTIAL: Good   CLINICAL DECISION MAKING: Stable/uncomplicated   EVALUATION COMPLEXITY: Low     GOALS: Goals reviewed with patient? Yes   SHORT TERM GOALS: Target date: 09/14/2022   Pt will be independent with HEP to minimize adverse effects of immobilization and gently mobilize R shoulder to improve pain-free function at home and work. Baseline: 08/17/22: Baseline HEP initiated.   11/17/22:  Pt is compliant with HEP and verbalizes understanding of given exercises Goal status: ACHIEVED     LONG TERM GOALS: Target date: 12/07/22   Pt will increase FOTO to at least 56 to demonstrate significant improvement in function at home and work related to neck pain  Baseline: 08/17/22: 4.   11/17/22: 61/56 Goal status: ACHIEVED   2.  Pt will decrease worst shoulder pain by at least 3 points on the NPRS in order to demonstrate clinically significant reduction in shoulder pain. Baseline: 08/17/22: 5-6/10 at worst over last week.   09/23/22: 2/10.   11/17/22: 2-3/10 at worst Goal status: IN PROGRESS    3.   Pt will have R shoulder AROM at least within 10 degrees of contralateral upper extremity indicative of improved ROM as needed for reaching, overhead work, self-care  activities/dressing/grooming Baseline: 08/17/22: NO AROM presently, severely limited PROM in early post-op phase.  09/23/22: AROM not appropriate yet at this point post-operatively   11/17/22: Motion loss remains in all planes of motion  Goal status: NOT MET   4.   Pt will have R shoulder MMTs at 4+/5 or greater for all directions performed indicative of improved strength as needed for completion of daily lifting, reaching, carrying tasks (e.g. 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  09/23/22: still deferred at this point post-operatively.   11/17/22: MMT for R shoulder 3+ to 4 (see chart above) Goal status: ON-GOING   5.   Pt will perform unilateral farmer's carry with 15 lbs for 100 ft without reproduction of pain simulating carrying items in [employer] including carrying trash bag  Baseline: 08/17/22: Unable to perform lifting/carrying.  09/23/22: unable.   11/17/22: Deferred to later date Goal status: DEFERRED     PLAN: PT FREQUENCY: 2x/week   PT DURATION: 6 weeks   PLANNED INTERVENTIONS: Therapeutic exercises, Therapeutic activity, Neuromuscular re-education, Balance training, Gait training, Patient/Family education,  Joint mobilization, Electrical stimulation, Cryotherapy, Moist heat, and Manual therapy   PLAN FOR NEXT SESSION: Progress to full R shoulder ROM as tolerated. Continue with R shoulder AA/AROM and gradually progress to light RTC isotonics as tolerated. Progress with strengthening as AROM is restored. Recommend continued PT 2x/week for 6 weeks  Valentina Gu, PT, DPT #[id number]  Eilleen Kempf, PT 11/17/2022, 2:07 PM

## 2022-11-19 ENCOUNTER — Ambulatory Visit: Payer: Medicare Other | Admitting: Physical Therapy

## 2022-11-19 ENCOUNTER — Encounter: Payer: Self-pay | Admitting: Physical Therapy

## 2022-11-19 DIAGNOSIS — M25611 Stiffness of right shoulder, not elsewhere classified: Secondary | ICD-10-CM

## 2022-11-19 DIAGNOSIS — M6281 Muscle weakness (generalized): Secondary | ICD-10-CM

## 2022-11-19 DIAGNOSIS — M25511 Pain in right shoulder: Secondary | ICD-10-CM | POA: Diagnosis not present

## 2022-11-19 NOTE — Therapy (Signed)
OUTPATIENT PHYSICAL THERAPY TREATMENT    Patient Name: Shawn Atkins MRN: [id number] DOB:06-10-1956, 67 y.o., male Today's Date: 11/19/2022  PCP: Lynnell Jude, MD REFERRING PROVIDER: [name], MD  END OF SESSION:   PT End of Session - 11/19/22 1349     Visit Number 21    Number of Visits 30    Date for PT Re-Evaluation 12/07/22    Authorization Type [employer] Medicare, VL based on medical necessity    Authorization Time Period 08/18/23-10/26/22    Progress Note Due on Visit 10    PT Start Time 1344    PT Stop Time 1428    PT Time Calculation (min) 44 min    Activity Tolerance Patient limited by pain;Patient tolerated treatment well    Behavior During Therapy WFL for tasks assessed/performed                Past Medical History:  Diagnosis Date   BPH (benign prostatic hypertrophy)    Bronchitis    Chronic prostatitis    Complication of anesthesia    Felt like "couldn't breathe" after triple Hernia surgery   Diabetes mellitus without complication (HCC)    Flank pain    GERD (gastroesophageal reflux disease)    h/o   Gross hematuria    History of kidney stones    HTN (hypertension)    pt states he takes lisinopril for kidney protection due to dm not htn   Inguinal hernia    Nocturia    OSA on CPAP    with 02 2 L   Overweight    PONV (postoperative nausea and vomiting)    only during kidney stone surgery   Spermatocele    Stricture of urethra    Vertigo    1 episode, 6-7 yrs ago   Past Surgical History:  Procedure Laterality Date   BICEPT TENODESIS Right 08/06/2022   Procedure: BICEPS TENODESIS;  Surgeon: [name], MD;  Location: [facility];  Service: Orthopedics;  Laterality: Right;   CATARACT EXTRACTION W/PHACO Left 07/22/2020   Procedure: CATARACT EXTRACTION PHACO AND INTRAOCULAR LENS PLACEMENT ([facility]) LEFT DIABETIC 4.14  00:33.0;  Surgeon: Eulogio Bear, MD;  Location: [place];  Service: Ophthalmology;  Laterality: Left;  Diabetic  - oral meds   CATARACT EXTRACTION W/PHACO Right 08/12/2020   Procedure: CATARACT EXTRACTION PHACO AND INTRAOCULAR LENS PLACEMENT ([place]) RIGHT DIABETIC;  Surgeon: Eulogio Bear, MD;  Location: [place];  Service: Ophthalmology;  Laterality: Right;  1.99 0:26.2   COLONOSCOPY WITH PROPOFOL N/A 11/20/2015   Procedure: COLONOSCOPY WITH PROPOFOL;  Surgeon: Christene Lye, MD;  Location: [facility];  Service: Endoscopy;  Laterality: N/A;   HERNIA REPAIR Bilateral 1025   umbilical and bil inguinal/ Dr Marina Gravel   KIDNEY STONE SURGERY     KNEE ARTHROSCOPY Left    open lithotomy     SHOULDER ARTHROSCOPY WITH OPEN ROTATOR CUFF REPAIR AND DISTAL CLAVICLE ACROMINECTOMY Right 08/06/2022   Procedure: SHOULDER ARTHROSCOPY WITH OPEN ROTATOR CUFF REPAIR AND DISTAL CLAVICLE ACROMINECTOMY;  Surgeon: [name], MD;  Location: [facility];  Service: Orthopedics;  Laterality: Right;   Patient Active Problem List   Diagnosis Date Noted   History of rectal bleeding 06/01/2018   Left groin pain 03/18/2018   Sacroiliac joint pain 01/13/2017   Abdominal pain, left lower quadrant 08/23/2015   BPH with obstruction/lower urinary tract symptoms 08/23/2015   Biceps tendinitis 06/25/2015    REFERRING DIAG: M75.101 Unspecified rotator cuff tear or rupture  of right shoulder, not specified as traumatic  THERAPY DIAG:  Acute pain of right shoulder  Stiffness of right shoulder, not elsewhere classified  Muscle weakness (generalized)  Rationale for Evaluation and Treatment Rehabilitation   PERTINENT HISTORY: Pt is a 67 year old male s/p R shoulder rotator cuff repair and distal clavicle acromionectomy with Dr. Mack Guise on 08/06/22. Patient reports that his surgeon informed him he did have full tear of rotator cuff in surgery. Patient reports 4 days of severe pain just out of surgery. Patient reports managing pain well with Tylenol/Ibuprofen well now. Pt followed up with [facility] for erythema  and increased temperature as well as notable edema; ruled out infection and DVT. Patient reports comorbid neck and R upper trap pain. Pt reports difficulty with sleep positioning due to comorbid neck pain.    Pain:  Pain Intensity: Present: 2-3/10, Best: 2/10, Worst: 5-6/10 Pain location: R shoulder along deltoid and biceps Pain Quality: steady pain, intense intermittently; sharp pain along ACJ that comes and goes Radiating: Yes , to anterior arm Numbness/Tingling: No Aggravating factors: moving his R arm Relieving factors: Ibuprofen, Tylenol, resting arm/arm propped on armrest  History of prior shoulder or neck/shoulder injury, pain, surgery, or therapy: Yes, L shoulder with hx of cortisone injections, Hx of neck injury/PT for his neck Falls: Has patient fallen in last 6 months? No Dominant hand: left Imaging: Yes , MRI pre-operatively Prior level of function: Independent Occupational demands: Pt works part-time with [employer] (20 hrs/week), pt out of work now on medical leave Hobbies: [employer] (personal history of cancer, chills/fever, night sweats, nausea, vomiting, unrelenting pain): Negative   Precautions: None   Weight Bearing Restrictions: No   Living Environment Lives with: lives with their spouse Lives in: House/apartment     Patient Goals: Able to pick up weight with his arm, fish; able to play with baseball/bat with his grandson    PRECAUTIONS: No R shoulder AROM, no RUE lifting   DOS: 08/06/22, s/p R large RTC repair, R distal acromionectomy -8 weeks post-op 10/01/22 -12 weeks post-op 10/29/22   SUBJECTIVE:                                                                                                                                                                                      SUBJECTIVE STATEMENT:  Patient reports discomfort along R upper trapezius/supraclavicular region at arrival. He reports that his surgeon was not concerned with edema along  supraclavicular region. He has been informed he may be candidate for ACDF. His MD had discussed putting in referral for PT for cervical spine.  He reports mild R shoulder pain presently.    PAIN:  Are you having pain? Yes: NPRS scale: 2-3/10 pain at arrival  Pain location: pain into R paracervical/upper trap region mainly     OBJECTIVE: (objective measures completed at initial evaluation unless otherwise dated)     Patient Surveys  FOTO: 4, predicted outcome score of 56     Cognition Patient is oriented to person, place, and time.  Recent memory is intact.  Remote memory is intact.  Attention span and concentration are intact.  Expressive speech is intact.  Patient's fund of knowledge is within normal limits for educational level.                          Gross Musculoskeletal Assessment Tremor: None Bulk: Atrophy of deltoid/posterior cuff mm Tone: Normal   Observation No sign of acute infection, mild erythema along R upper arm, mild increased tissue temperature along R middle deltoid         Posture FHRS posture     AROM         AROM (Normal range in degrees) AROM 08/17/2022 AROM 11/19/2022    Right Left Right Left  Shoulder        Flexion Deferred   125 155  Extension Deferred      Abduction Deferred   128 165  External Rotation Deferred   80 WNL  Internal Rotation Deferred   50 WNL  Hands Behind Head Deferred   C7 T3  Hands Behind Back Deferred   L3 T12           Elbow        Flexion WNL      Extension WNL      Pronation 50      Supination 60      (* = pain; Blank rows = not tested)     PROM       PROM (Normal range in degrees) PROM 08/17/2022 PROM 11/17/22    Right Left Right Left  Shoulder        Flexion 40    160  Extension        Abduction     108 WNL  External Rotation (arm at side) To neutral/0 deg    WNL  Internal Rotation (arm at side) 45    WNL        UE MMT:  MMT (out of 5) Right 11/19/2022 Left 11/19/2022         Shoulder    Flexion 3+  5   Extension      Abduction 4-   4+  External rotation  4-  4+  Internal rotation  4 5   Horizontal abduction      Horizontal adduction      Lower Trapezius      Rhomboids             Elbow  Flexion  4+ 5  Extension  4+  5  Pronation      Supination             Wrist  Flexion      Extension      Radial deviation      Ulnar deviation      (* = pain; Blank rows = not tested)     Sensation Deferred   Reflexes Deferred     Palpation   Location LEFT  RIGHT           Subocciptials      Cervical paraspinals  Upper Trapezius      Levator Scapulae      Rhomboid Major/Minor      Sternoclavicular joint      Acromioclavicular joint   2  Coracoid process   1  Long head of biceps   1  Supraspinatus   2  Infraspinatus   1  Subscapularis      Teres Minor      Teres Major      Pectoralis Major      Pectoralis Minor      Anterior Deltoid   1  Lateral Deltoid   2  Posterior Deltoid      Latissimus Dorsi      Sternocleidomastoid      (Blank rows = not tested) Graded on 0-4 scale (0 = no pain, 1 = pain, 2 = pain with wincing/grimacing/flinching, 3 = pain with withdrawal, 4 = unwilling to allow palpation), (Blank rows = not tested)       TODAY'S TREATMENT   Manual Therapy - for R shoulder ROM and to prevent R shoulder stiffness; manual perturbations for re-training of stabilizers of GHJ   R shoulder PROM flexion, abduction, ER/IR within pt tolerance with gentle overpressure as able x 15 minutes  *next visit* Rhythmic stabilization; arm at 100 deg forward elevation; 1 x 2 minutes (moderate perturbations)  *not today* Gentle STM to R anterior and middle deltoid, biceps, upper trapezius; x 2 minutes DTM/TPR R UT; x 3 minutes DTM/Tpr R upper trapezius; x 3 minutes     Therapeutic Exercise - for shoulder ROM as needed for reaching and grasping tasks, self-care ADLs, and household chores  Wand ER in supine, 60 deg abduction; 2x10, 3 sec Sidelying  shoulder abduction; 2x10, ROM as tolerated  Sidelying ER AROM; 2x10, verbal and tactile cueing for technique Wand ER, 90 deg scaption, in supine; 2x10 Finger ladder, x10, 5 sec hold at top for forward flexion   *next visit* Serratus slide, at wall on foam roller; 2x10, 5 sec hold at top  *not today* Supine flexion AROM; 2x10 Pulleys; shoulder complex abduction ; x 2 minutes Standing Tband row, Green Tband; 2x12 Wand ER AAROM; 2x10 Table slide PROM Flexion x15 Table slide PROM Abd x15 Gripping; pink Theraputty; composite fist, performed x 2 minutes Wrist AROM; flexion/extension, RD/UD, pronation/supination; reviewed for HEP    Cold pack (unbilled) - for anti-inflammatory and analgesic effect as needed for reduced pain and improved ability to participate in active PT intervention, along R shoulder in sitting, pillow propped under R forearm for upper limb support, x 5 minutes    PATIENT EDUCATION:  Education details: Plan of care Person educated: Patient Education method: Explanation Education comprehension: verbalized understanding     HOME EXERCISE PROGRAM: Access Code: [email or web address] URL: [email or web address] Date: 10/10/2022 Prepared by: Valentina Gu  Exercises - Flexion-Extension Shoulder Pendulum with Table Support  - 2 x daily - 7 x weekly - 3 sets - 10 reps - Seated Scapular Retraction  - 2 x daily - 7 x weekly - 2 sets - 10 reps - 3sec hold - Supine Shoulder Flexion Extension AAROM with Dowel  - 2 x daily - 7 x weekly - 2 sets - 10 reps - Supine Shoulder External Rotation in 45 Degrees Abduction AAROM with Dowel  - 2 x daily - 7 x weekly - 2 sets - 10 reps - Seated Shoulder Flexion AAROM with Pulley Behind  - 2 x daily - 7 x weekly - 2 sets - 10 reps  ASSESSMENT:   CLINICAL IMPRESSION: Patient has participated well in PT since having cervical spine injections. He presents with improving forward elevation ROM toward functional range passively. Pt  still has significant AROM limitations for shoulder elevation and functional ER/IR (I.e. hand behind head and behind back). Patient needs further work on restoration of active and passive ROM. We will continue with RTC strengthening as pt moves into late-phase rehab. Patient has ongoing post-operative impairments in R shoulder AROM and PROM, post-operative pain, decreased strength, R shoulder/arm pain. Patient will benefit from continued skilled PT to address above impairments and improve overall function.   REHAB POTENTIAL: Good   CLINICAL DECISION MAKING: Stable/uncomplicated   EVALUATION COMPLEXITY: Low     GOALS: Goals reviewed with patient? Yes   SHORT TERM GOALS: Target date: 09/14/2022   Pt will be independent with HEP to minimize adverse effects of immobilization and gently mobilize R shoulder to improve pain-free function at home and work. Baseline: 08/17/22: Baseline HEP initiated.   11/17/22:  Pt is compliant with HEP and verbalizes understanding of given exercises Goal status: ACHIEVED     LONG TERM GOALS: Target date: 12/07/22   Pt will increase FOTO to at least 56 to demonstrate significant improvement in function at home and work related to neck pain  Baseline: 08/17/22: 4.   11/17/22: 61/56 Goal status: ACHIEVED   2.  Pt will decrease worst shoulder pain by at least 3 points on the NPRS in order to demonstrate clinically significant reduction in shoulder pain. Baseline: 08/17/22: 5-6/10 at worst over last week.   09/23/22: 2/10.   11/17/22: 2-3/10 at worst Goal status: IN PROGRESS    3.   Pt will have R shoulder AROM at least within 10 degrees of contralateral upper extremity indicative of improved ROM as needed for reaching, overhead work, self-care activities/dressing/grooming Baseline: 08/17/22: NO AROM presently, severely limited PROM in early post-op phase.  09/23/22: AROM not appropriate yet at this point post-operatively   11/17/22: Motion loss remains in all planes  of motion  Goal status: NOT MET   4.   Pt will have R shoulder MMTs at 4+/5 or greater for all directions performed indicative of improved strength as needed for completion of daily lifting, reaching, carrying tasks (e.g. 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  09/23/22: still deferred at this point post-operatively.   11/17/22: MMT for R shoulder 3+ to 4 (see chart above) Goal status: ON-GOING   5.   Pt will perform unilateral farmer's carry with 15 lbs for 100 ft without reproduction of pain simulating carrying items in [employer] including carrying trash bag  Baseline: 08/17/22: Unable to perform lifting/carrying.  09/23/22: unable.   11/17/22: Deferred to later date Goal status: DEFERRED     PLAN: PT FREQUENCY: 2x/week   PT DURATION: 6 weeks   PLANNED INTERVENTIONS: Therapeutic exercises, Therapeutic activity, Neuromuscular re-education, Balance training, Gait training, Patient/Family education,  Joint mobilization, Electrical stimulation, Cryotherapy, Moist heat, and Manual therapy   PLAN FOR NEXT SESSION: Progress to full R shoulder ROM as tolerated. Continue with R shoulder AA/AROM and gradually progress to light RTC isotonics as tolerated. Progress with strengthening as AROM is restored. Recommend continued PT 2x/week for 6 weeks    Valentina Gu, PT, DPT #[id number]  Eilleen Kempf, PT 11/19/2022, 2:11 PM

## 2022-11-22 NOTE — Progress Notes (Signed)
11/23/22 9:21 AM   Shawn Atkins [birth year] [id number]  Referring provider:  Lynnell Jude, MD [address] [place],  [place] [address]  Urological history: 1. BPH with LU TS -PSA (06/2022) 0.14 -I PSS 11/2 -PVR 2 mL -tadalafil 5 mg daily  2. Nocturia -Risk factors for nocturia: hypertension and BPH.   -PVR 2 mL   3. ED - contributing factors of age, BPH, testosterone deficiency and DM -SHIM 20 - tadalafil 5 mg daily    4. High risk hematuria - Former smoker - CTU 2014  left kidney demonstrates focal wedge-shaped indentation with near complete cortical loss within the lateral aspect of the mid pole of the kidney. A focal cyst is identified in this  area demonstrating Hounsfield units of 1 and 7 and measures approximately 1 cm - no cystoscopy for unknown reasons -no reports of gross heme -UA negative for micro heme    5. Nephrolithiasis - contrast CT in 2016 notes 2 mm right mid kidney nonobstructive calculus. 1-2 mm right kidney lower pole nonobstructive calculus. Potential 1 mm left kidney lower pole nonobstructive calculus - no stones seen on contrast CT in 2019  6. Prostate cancer screening -PSA (06/2022) 0.14 -AUA guidelines (2023) recommend screening for prostate cancer in men ages 81 to 64 every 2 to 4 years -no family history of prostate cancer, breast cancer or ovarian cancer  -he will have yearly screening secondary to TRT  7. Testosterone deficiency -contributing factors of age, diabetes and obesity -testosterone level (09/2022) 492 -hemoglobin/hematocrit (09/2022) 15.3/46.0 -Testosterone cypionate 200/milliliters, 1 cc every 14 days  Chief Complaint  Patient presents with   Benign Prostatic Hypertrophy     HPI: Shawn Atkins is a 67 y.o.male who presents today follow up.     I PSS 11/2  PVR 2 mL  Urinalysis is yellow clear, 3+ glucose, specific gravity 1.010, pH 5.0, 0-5 WBCs, 0-2 RBCs 0-10 epithelial cells and few bacteria.    He is not  having any urinary complaints.  Patient denies any modifying or aggravating factors.  Patient denies any gross hematuria, dysuria or suprapubic/flank pain.  Patient denies any fevers, chills, nausea or vomiting.     IPSS     Row Name 11/23/22 0900         International Prostate Symptom Score   How often have you had the sensation of not emptying your bladder? Less than half the time     How often have you had to urinate less than every two hours? About half the time     How often have you found you stopped and started again several times when you urinated? Less than 1 in 5 times     How often have you found it difficult to postpone urination? Less than 1 in 5 times     How often have you had a weak urinary stream? Less than 1 in 5 times     How often have you had to strain to start urination? Less than 1 in 5 times     How many times did you typically get up at night to urinate? 2 Times     Total IPSS Score 11       Quality of Life due to urinary symptoms   If you were to spend the rest of your life with your urinary condition just the way it is now how would you feel about that? Mostly Satisfied  Score:  1-7 Mild 8-19 Moderate 20-35 Severe    SHIM 20  Patient still having spontaneous erections.   He denies any pain or curvature with erections.  He and his wife do not have intercourse due to each other's muscle skeletal issues.   SHIM     Row Name 11/23/22 0903         SHIM: Over the last 6 months:   How do you rate your confidence that you could get and keep an erection? Moderate     When you had erections with sexual stimulation, how often were your erections hard enough for penetration (entering your partner)? Almost Always or Always     During sexual intercourse, how often were you able to maintain your erection after you had penetrated (entered) your partner? Most Times (much more than half the time)     During sexual intercourse, how difficult was it  to maintain your erection to completion of intercourse? Slightly Difficult     When you attempted sexual intercourse, how often was it satisfactory for you? Most Times (much more than half the time)       SHIM Total Score   SHIM 20              Score: 1-7 Severe ED 8-11 Moderate ED 12-16 Mild-Moderate ED 17-21 Mild ED 22-25 No ED     PMH: Past Medical History:  Diagnosis Date   BPH (benign prostatic hypertrophy)    Bronchitis    Chronic prostatitis    Complication of anesthesia    Felt like "couldn't breathe" after triple Hernia surgery   Diabetes mellitus without complication (HCC)    Flank pain    GERD (gastroesophageal reflux disease)    h/o   Gross hematuria    History of kidney stones    HTN (hypertension)    pt states he takes lisinopril for kidney protection due to dm not htn   Inguinal hernia    Nocturia    OSA on CPAP    with 02 2 L   Overweight    PONV (postoperative nausea and vomiting)    only during kidney stone surgery   Spermatocele    Stricture of urethra    Vertigo    1 episode, 6-7 yrs ago    Surgical History: Past Surgical History:  Procedure Laterality Date   BICEPT TENODESIS Right 08/06/2022   Procedure: BICEPS TENODESIS;  Surgeon: [name], MD;  Location: [facility];  Service: Orthopedics;  Laterality: Right;   CATARACT EXTRACTION W/PHACO Left 07/22/2020   Procedure: CATARACT EXTRACTION PHACO AND INTRAOCULAR LENS PLACEMENT ([facility]) LEFT DIABETIC 4.14  00:33.0;  Surgeon: Eulogio Bear, MD;  Location: [facility];  Service: Ophthalmology;  Laterality: Left;  Diabetic - oral meds   CATARACT EXTRACTION W/PHACO Right 08/12/2020   Procedure: CATARACT EXTRACTION PHACO AND INTRAOCULAR LENS PLACEMENT ([place]) RIGHT DIABETIC;  Surgeon: Eulogio Bear, MD;  Location: [facility];  Service: Ophthalmology;  Laterality: Right;  1.99 0:26.2   COLONOSCOPY WITH PROPOFOL N/A 11/20/2015   Procedure: COLONOSCOPY WITH PROPOFOL;   Surgeon: Christene Lye, MD;  Location: [facility];  Service: Endoscopy;  Laterality: N/A;   HERNIA REPAIR Bilateral 0938   umbilical and bil inguinal/ Dr Marina Gravel   KIDNEY STONE SURGERY     KNEE ARTHROSCOPY Left    open lithotomy     SHOULDER ARTHROSCOPY WITH OPEN ROTATOR CUFF REPAIR AND DISTAL CLAVICLE ACROMINECTOMY Right 08/06/2022   Procedure: SHOULDER ARTHROSCOPY WITH OPEN  ROTATOR CUFF REPAIR AND DISTAL CLAVICLE ACROMINECTOMY;  Surgeon: [name], MD;  Location: [facility];  Service: Orthopedics;  Laterality: Right;    Home Medications:  Allergies as of 11/23/2022       Reactions   Penicillins Anaphylaxis   Morphine And Related Nausea And Vomiting        Medication List        Accurate as of November 23, 2022  9:21 AM. If you have any questions, ask your nurse or doctor.          2-3CC SYRINGE 3 ML Misc 1 mg by Does not apply route every 14 (fourteen) days.   albuterol (2.5 MG/3ML) 0.083% nebulizer solution Commonly known as: PROVENTIL Take 2.5 mg by nebulization every 6 (six) hours as needed for wheezing or shortness of breath (Last used in the winter of 2022).   atorvastatin 20 MG tablet Commonly known as: LIPITOR Take 20 mg by mouth every evening.   BD Disp Needles 18G X 1-1/2" Misc Generic drug: NEEDLE (DISP) 18 G 1 mg by Does not apply route every 14 (fourteen) days.   BD Disp Needles 21G X 1-1/2" Misc Generic drug: NEEDLE (DISP) 21 G 1 mg by Does not apply route every 14 (fourteen) days.   BIOTIN PO Take 1 tablet by mouth daily.   glucosamine-chondroitin 500-400 MG tablet Take 1 tablet by mouth 2 (two) times daily.   lisinopril 10 MG tablet Commonly known as: ZESTRIL Take 10 mg by mouth every morning.   metFORMIN 500 MG 24 hr tablet Commonly known as: GLUCOPHAGE-XR Take 1,000 mg by mouth 2 (two) times daily with a meal.   multivitamin tablet Take 1 tablet by mouth daily.   pioglitazone 15 MG tablet Commonly known as: ACTOS Take  15 mg by mouth daily.   tadalafil 5 MG tablet Commonly known as: CIALIS Take 1 tablet (5 mg total) by mouth every morning. BPH What changed: See the new instructions. Changed by: Zara Council, PA-C   testosterone cypionate 200 MG/ML injection Commonly known as: DEPOTESTOSTERONE CYPIONATE Inject 1 mL (200 mg total) into the muscle every 14 (fourteen) days.        Allergies:  Allergies  Allergen Reactions   Penicillins Anaphylaxis   Morphine And Related Nausea And Vomiting    Family History: Family History  Problem Relation Age of Onset   Benign prostatic hyperplasia Father    Kidney disease Neg Hx    Prostate cancer Neg Hx     Social History:  reports that he quit smoking about 39 years ago. His smoking use included cigarettes. He has a 15.00 pack-year smoking history. He has been exposed to tobacco smoke. His smokeless tobacco use includes snuff. He reports current alcohol use of about 1.0 standard drink of alcohol per week. He reports that he does not use drugs.   Physical Exam: BP (!) 144/81   Pulse 84   Ht '5\' 9"'$  (1.753 m)   Wt 202 lb (91.6 kg)   BMI 29.83 kg/m   Constitutional:  Well nourished. Alert and oriented, No acute distress. HEENT: [place] AT, moist mucus membranes.  Trachea midline Cardiovascular: No clubbing, cyanosis, or edema. Respiratory: Normal respiratory effort, no increased work of breathing. GU: No CVA tenderness.  No bladder fullness or masses.  Patient with circumcised phallus.  Urethral meatus is patent.  No penile discharge. No penile lesions or rashes. Scrotum without lesions, cysts, rashes and/or edema.  Testicles are located scrotally bilaterally. No masses are appreciated in  the testicles. Left and right epididymis are normal.  Left spermatocele is palpated. Rectal: Patient with  normal sphincter tone. Anus and perineum without scarring or rashes. No rectal masses are appreciated. Prostate is approximately 50 grams, no nodules are appreciated.  Seminal vesicles could not be palpated  Neurologic: Grossly intact, no focal deficits, moving all 4 extremities. Psychiatric: Normal mood and affect.   Laboratory Data: See EPIC and Urological History  I have reviewed the labs.   Pertinent Imaging  11/23/22 09:01  Scan Result 2    Assessment & Plan:    1. Testosterone deficiency  -testosterone levels are therapeutic -H & H WNL -Continue testosterone cypionate 200 mg/milliliters, 1 cc every 14 days   2. BPH with LUTS -PSA stable  -DRE benign  -UA benign  -PVR < 300 cc  -symptoms - frequency  -continue conservative management, avoiding bladder irritants and timed voiding's -Continue Cialis 5 mg daily   3. ED - I explained to the patient that in order to achieve an erection it takes good functioning of the nervous system (parasympathetic and rs, sympathetic, sensory and motor), good blood flow into the erectile tissue of the penis and a desire to have sex - I explained that conditions like diabetes, hypertension, coronary artery disease, peripheral vascular disease, smoking, alcohol consumption, age, sleep apnea and BPH can diminish the ability to have an erection - A recent study published in Sex Med 2018 Apr 13 revealed moderate to vigorous aerobic exercise for 40 minutes 4 times per week can decrease erectile problems caused by physical inactivity, obesity, hypertension, metabolic syndrome and/or cardiovascular diseases  -continue tadalafil 5 mg daily    Return in about 6 months (around 05/24/2023) for PSA, testosterone (one week after injection) H & H only .  Bettymae Yott, [place] [address], [place], [place] [address] [phone]

## 2022-11-23 ENCOUNTER — Other Ambulatory Visit: Payer: Self-pay | Admitting: Family Medicine

## 2022-11-23 ENCOUNTER — Encounter: Payer: Self-pay | Admitting: Urology

## 2022-11-23 ENCOUNTER — Ambulatory Visit: Payer: Medicare Other | Admitting: Urology

## 2022-11-23 VITALS — BP 144/81 | HR 84 | Ht 69.0 in | Wt 202.0 lb

## 2022-11-23 DIAGNOSIS — E349 Endocrine disorder, unspecified: Secondary | ICD-10-CM | POA: Diagnosis not present

## 2022-11-23 DIAGNOSIS — N529 Male erectile dysfunction, unspecified: Secondary | ICD-10-CM | POA: Diagnosis not present

## 2022-11-23 DIAGNOSIS — N138 Other obstructive and reflux uropathy: Secondary | ICD-10-CM

## 2022-11-23 DIAGNOSIS — N401 Enlarged prostate with lower urinary tract symptoms: Secondary | ICD-10-CM

## 2022-11-23 LAB — URINALYSIS, COMPLETE
Bilirubin, UA: NEGATIVE
Ketones, UA: NEGATIVE
Leukocytes,UA: NEGATIVE
Nitrite, UA: NEGATIVE
Protein,UA: NEGATIVE
RBC, UA: NEGATIVE
Specific Gravity, UA: 1.01 (ref 1.005–1.030)
Urobilinogen, Ur: 0.2 mg/dL (ref 0.2–1.0)
pH, UA: 5 (ref 5.0–7.5)

## 2022-11-23 LAB — MICROSCOPIC EXAMINATION

## 2022-11-23 LAB — BLADDER SCAN AMB NON-IMAGING: Scan Result: 2

## 2022-11-23 MED ORDER — TADALAFIL 5 MG PO TABS: 5.0000 mg | ORAL_TABLET | ORAL | 3 refills | Status: DC

## 2022-11-23 NOTE — Patient Instructions (Signed)
Go to the lab in [place] to have your blood work done before your appointment on the in between week of your injection in July.

## 2022-11-23 NOTE — Addendum Note (Signed)
Addended by: Eilleen Kempf on: 11/23/2022 06:10 AM   Modules accepted: Orders

## 2022-11-24 ENCOUNTER — Ambulatory Visit: Payer: Medicare Other | Admitting: Physical Therapy

## 2022-11-24 DIAGNOSIS — M25611 Stiffness of right shoulder, not elsewhere classified: Secondary | ICD-10-CM

## 2022-11-24 DIAGNOSIS — M6281 Muscle weakness (generalized): Secondary | ICD-10-CM

## 2022-11-24 DIAGNOSIS — M25511 Pain in right shoulder: Secondary | ICD-10-CM

## 2022-11-24 NOTE — Therapy (Signed)
OUTPATIENT PHYSICAL THERAPY TREATMENT    Patient Name: Shawn Atkins MRN: [id number] DOB:[birth year], 67 y.o., male Today's Date: 11/24/2022  PCP: Lynnell Jude, MD REFERRING PROVIDER: [name], MD  END OF SESSION:   PT End of Session - 11/24/22 1353     Visit Number 22    Number of Visits 30    Date for PT Re-Evaluation 12/07/22    Authorization Type [employer] Medicare, VL based on medical necessity    Authorization Time Period 08/18/23-10/26/22    Progress Note Due on Visit 10    PT Start Time 1348    PT Stop Time 1435    PT Time Calculation (min) 47 min    Activity Tolerance Patient limited by pain;Patient tolerated treatment well    Behavior During Therapy WFL for tasks assessed/performed                 Past Medical History:  Diagnosis Date   BPH (benign prostatic hypertrophy)    Bronchitis    Chronic prostatitis    Complication of anesthesia    Felt like "couldn't breathe" after triple Hernia surgery   Diabetes mellitus without complication (HCC)    Flank pain    GERD (gastroesophageal reflux disease)    h/o   Gross hematuria    History of kidney stones    HTN (hypertension)    pt states he takes lisinopril for kidney protection due to dm not htn   Inguinal hernia    Nocturia    OSA on CPAP    with 02 2 L   Overweight    PONV (postoperative nausea and vomiting)    only during kidney stone surgery   Spermatocele    Stricture of urethra    Vertigo    1 episode, 6-7 yrs ago   Past Surgical History:  Procedure Laterality Date   BICEPT TENODESIS Right 08/06/2022   Procedure: BICEPS TENODESIS;  Surgeon: [name], MD;  Location: [facility];  Service: Orthopedics;  Laterality: Right;   CATARACT EXTRACTION W/PHACO Left 07/22/2020   Procedure: CATARACT EXTRACTION PHACO AND INTRAOCULAR LENS PLACEMENT (I[facility]) LEFT DIABETIC 4.14  00:33.0;  Surgeon: Eulogio Bear, MD;  Location: [place];  Service: Ophthalmology;  Laterality: Left;   Diabetic - oral meds   CATARACT EXTRACTION W/PHACO Right 08/12/2020   Procedure: CATARACT EXTRACTION PHACO AND INTRAOCULAR LENS PLACEMENT ([place]) RIGHT DIABETIC;  Surgeon: Eulogio Bear, MD;  Location: [place];  Service: Ophthalmology;  Laterality: Right;  1.99 0:26.2   COLONOSCOPY WITH PROPOFOL N/A 11/20/2015   Procedure: COLONOSCOPY WITH PROPOFOL;  Surgeon: Christene Lye, MD;  Location: [facility];  Service: Endoscopy;  Laterality: N/A;   HERNIA REPAIR Bilateral 6195   umbilical and bil inguinal/ Dr Marina Gravel   KIDNEY STONE SURGERY     KNEE ARTHROSCOPY Left    open lithotomy     SHOULDER ARTHROSCOPY WITH OPEN ROTATOR CUFF REPAIR AND DISTAL CLAVICLE ACROMINECTOMY Right 08/06/2022   Procedure: SHOULDER ARTHROSCOPY WITH OPEN ROTATOR CUFF REPAIR AND DISTAL CLAVICLE ACROMINECTOMY;  Surgeon: [name], MD;  Location: [facility];  Service: Orthopedics;  Laterality: Right;   Patient Active Problem List   Diagnosis Date Noted   History of rectal bleeding 06/01/2018   Left groin pain 03/18/2018   Sacroiliac joint pain 01/13/2017   Abdominal pain, left lower quadrant 08/23/2015   BPH with obstruction/lower urinary tract symptoms 08/23/2015   Biceps tendinitis 06/25/2015    REFERRING DIAG: M75.101 Unspecified rotator cuff tear or  rupture of right shoulder, not specified as traumatic  THERAPY DIAG:  Acute pain of right shoulder  Stiffness of right shoulder, not elsewhere classified  Muscle weakness (generalized)  Rationale for Evaluation and Treatment Rehabilitation   PERTINENT HISTORY: Pt is a 67 year old male s/p R shoulder rotator cuff repair and distal clavicle acromionectomy with Dr. Mack Guise on 08/06/22. Patient reports that his surgeon informed him he did have full tear of rotator cuff in surgery. Patient reports 4 days of severe pain just out of surgery. Patient reports managing pain well with Tylenol/Ibuprofen well now. Pt followed up with [facility] for  erythema and increased temperature as well as notable edema; ruled out infection and DVT. Patient reports comorbid neck and R upper trap pain. Pt reports difficulty with sleep positioning due to comorbid neck pain.    Pain:  Pain Intensity: Present: 2-3/10, Best: 2/10, Worst: 5-6/10 Pain location: R shoulder along deltoid and biceps Pain Quality: steady pain, intense intermittently; sharp pain along ACJ that comes and goes Radiating: Yes , to anterior arm Numbness/Tingling: No Aggravating factors: moving his R arm Relieving factors: Ibuprofen, Tylenol, resting arm/arm propped on armrest  History of prior shoulder or neck/shoulder injury, pain, surgery, or therapy: Yes, L shoulder with hx of cortisone injections, Hx of neck injury/PT for his neck Falls: Has patient fallen in last 6 months? No Dominant hand: left Imaging: Yes , MRI pre-operatively Prior level of function: Independent Occupational demands: Pt works part-time with [employer] (20 hrs/week), pt out of work now on medical leave Hobbies: [employer] (personal history of cancer, chills/fever, night sweats, nausea, vomiting, unrelenting pain): Negative   Precautions: None   Weight Bearing Restrictions: No   Living Environment Lives with: lives with their spouse Lives in: House/apartment     Patient Goals: Able to pick up weight with his arm, fish; able to play with baseball/bat with his grandson    PRECAUTIONS: No R shoulder AROM, no RUE lifting   DOS: 08/06/22, s/p R large RTC repair, R distal acromionectomy -8 weeks post-op 10/01/22 -12 weeks post-op 10/29/22   SUBJECTIVE:                                                                                                                                                                                      SUBJECTIVE STATEMENT:  Patient reports moderate soreness at arrival, but he denies shoulder pain this afternoon. He reports neck symptoms are "about the same - maybe  slightly better."    PAIN:  Are you having pain? Yes: NPRS scale: 2-3/10 pain at arrival  Pain location: pain into R paracervical/upper trap region mainly     OBJECTIVE: (objective  measures completed at initial evaluation unless otherwise dated)     Patient Surveys  FOTO: 4, predicted outcome score of 85     Cognition Patient is oriented to person, place, and time.  Recent memory is intact.  Remote memory is intact.  Attention span and concentration are intact.  Expressive speech is intact.  Patient's fund of knowledge is within normal limits for educational level.                          Gross Musculoskeletal Assessment Tremor: None Bulk: Atrophy of deltoid/posterior cuff mm Tone: Normal   Observation No sign of acute infection, mild erythema along R upper arm, mild increased tissue temperature along R middle deltoid         Posture FHRS posture     AROM         AROM (Normal range in degrees) AROM 08/17/2022 AROM 11/24/2022    Right Left Right Left  Shoulder        Flexion Deferred   125 155  Extension Deferred      Abduction Deferred   128 165  External Rotation Deferred   80 WNL  Internal Rotation Deferred   50 WNL  Hands Behind Head Deferred   C7 T3  Hands Behind Back Deferred   L3 T12           Elbow        Flexion WNL      Extension WNL      Pronation 50      Supination 60      (* = pain; Blank rows = not tested)     PROM       PROM (Normal range in degrees) PROM 08/17/2022 PROM 11/17/22    Right Left Right Left  Shoulder        Flexion 40    160  Extension        Abduction     108 WNL  External Rotation (arm at side) To neutral/0 deg    WNL  Internal Rotation (arm at side) 45    WNL        UE MMT:  MMT (out of 5) Right 11/24/2022 Left 11/24/2022         Shoulder   Flexion 3+  5   Extension      Abduction 4-   4+  External rotation  4-  4+  Internal rotation  4 5   Horizontal abduction      Horizontal adduction      Lower  Trapezius      Rhomboids             Elbow  Flexion  4+ 5  Extension  4+  5  Pronation      Supination             Wrist  Flexion      Extension      Radial deviation      Ulnar deviation      (* = pain; Blank rows = not tested)     Sensation Deferred   Reflexes Deferred     Palpation   Location LEFT  RIGHT           Subocciptials      Cervical paraspinals      Upper Trapezius      Levator Scapulae      Rhomboid Major/Minor      Sternoclavicular joint  Acromioclavicular joint   2  Coracoid process   1  Long head of biceps   1  Supraspinatus   2  Infraspinatus   1  Subscapularis      Teres Minor      Teres Major      Pectoralis Major      Pectoralis Minor      Anterior Deltoid   1  Lateral Deltoid   2  Posterior Deltoid      Latissimus Dorsi      Sternocleidomastoid      (Blank rows = not tested) Graded on 0-4 scale (0 = no pain, 1 = pain, 2 = pain with wincing/grimacing/flinching, 3 = pain with withdrawal, 4 = unwilling to allow palpation), (Blank rows = not tested)       TODAY'S TREATMENT   Manual Therapy - for R shoulder ROM and to prevent R shoulder stiffness; manual perturbations for re-training of stabilizers of GHJ   R shoulder PROM flexion, abduction, ER/IR within pt tolerance with gentle overpressure as able x 15 minutes  *next visit* Rhythmic stabilization; arm at 100 deg forward elevation; 1 x 2 minutes (moderate perturbations)  *not today* Gentle STM to R anterior and middle deltoid, biceps, upper trapezius; x 2 minutes DTM/TPR R UT; x 3 minutes DTM/Tpr R upper trapezius; x 3 minutes     Therapeutic Exercise - for shoulder ROM as needed for reaching and grasping tasks, self-care ADLs, and household chores  Wand ER in supine, 90 deg abduction; 2x10, 3 sec Sidelying shoulder abduction; 2x10, ROM as tolerated  Sidelying ER AROM; 2x10, 1-lb Dbell -verbal and tactile cueing for technique atus slide, at wall on foam roller; 2x10, 5  sec hold at top  *not today* Finger ladder, x10, 5 sec hold at top for forward flexion  Supine flexion AROM; 2x10 Pulleys; shoulder complex abduction ; x 2 minutes Standing Tband row, Green Tband; 2x12 Wand ER AAROM; 2x10 Table slide PROM Flexion x15 Table slide PROM Abd x15 Gripping; pink Theraputty; composite fist, performed x 2 minutes Wrist AROM; flexion/extension, RD/UD, pronation/supination; reviewed for HEP    Cold pack (unbilled) - for anti-inflammatory and analgesic effect as needed for reduced pain and improved ability to participate in active PT intervention, along R shoulder in sitting, pillow propped under R forearm for upper limb support, x 5 minutes    PATIENT EDUCATION:  Education details: Plan of care Person educated: Patient Education method: Explanation Education comprehension: verbalized understanding     HOME EXERCISE PROGRAM: Access Code: [email or web address] URL: [email or web address] Date: 10/10/2022 Prepared by: Valentina Gu  Exercises - Flexion-Extension Shoulder Pendulum with Table Support  - 2 x daily - 7 x weekly - 3 sets - 10 reps - Seated Scapular Retraction  - 2 x daily - 7 x weekly - 2 sets - 10 reps - 3sec hold - Supine Shoulder Flexion Extension AAROM with Dowel  - 2 x daily - 7 x weekly - 2 sets - 10 reps - Supine Shoulder External Rotation in 45 Degrees Abduction AAROM with Dowel  - 2 x daily - 7 x weekly - 2 sets - 10 reps - Seated Shoulder Flexion AAROM with Pulley Behind  - 2 x daily - 7 x weekly - 2 sets - 10 reps     ASSESSMENT:   CLINICAL IMPRESSION: Patient has participated well in PT since having cervical spine injections. He presents with improving forward elevation ROM toward functional range passively. Pt still has significant AROM limitations for  shoulder elevation and functional ER/IR (I.e. hand behind head and behind back). Patient needs further work on restoration of active and passive ROM. We will continue with RTC  strengthening as pt moves into late-phase rehab. Patient has ongoing post-operative impairments in R shoulder AROM and PROM, post-operative pain, decreased strength, R shoulder/arm pain. Patient will benefit from continued skilled PT to address above impairments and improve overall function.   REHAB POTENTIAL: Good   CLINICAL DECISION MAKING: Stable/uncomplicated   EVALUATION COMPLEXITY: Low     GOALS: Goals reviewed with patient? Yes   SHORT TERM GOALS: Target date: 09/14/2022   Pt will be independent with HEP to minimize adverse effects of immobilization and gently mobilize R shoulder to improve pain-free function at home and work. Baseline: 08/17/22: Baseline HEP initiated.   11/17/22:  Pt is compliant with HEP and verbalizes understanding of given exercises Goal status: ACHIEVED     LONG TERM GOALS: Target date: 12/07/22   Pt will increase FOTO to at least 56 to demonstrate significant improvement in function at home and work related to neck pain  Baseline: 08/17/22: 4.   11/17/22: 61/56 Goal status: ACHIEVED   2.  Pt will decrease worst shoulder pain by at least 3 points on the NPRS in order to demonstrate clinically significant reduction in shoulder pain. Baseline: 08/17/22: 5-6/10 at worst over last week.   09/23/22: 2/10.   11/17/22: 2-3/10 at worst Goal status: IN PROGRESS    3.   Pt will have R shoulder AROM at least within 10 degrees of contralateral upper extremity indicative of improved ROM as needed for reaching, overhead work, self-care activities/dressing/grooming Baseline: 08/17/22: NO AROM presently, severely limited PROM in early post-op phase.  09/23/22: AROM not appropriate yet at this point post-operatively   11/17/22: Motion loss remains in all planes of motion  Goal status: NOT MET   4.   Pt will have R shoulder MMTs at 4+/5 or greater for all directions performed indicative of improved strength as needed for completion of daily lifting, reaching, carrying tasks  (e.g. 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  09/23/22: still deferred at this point post-operatively.   11/17/22: MMT for R shoulder 3+ to 4 (see chart above) Goal status: ON-GOING   5.   Pt will perform unilateral farmer's carry with 15 lbs for 100 ft without reproduction of pain simulating carrying items in [employer] including carrying trash bag  Baseline: 08/17/22: Unable to perform lifting/carrying.  09/23/22: unable.   11/17/22: Deferred to later date Goal status: DEFERRED     PLAN: PT FREQUENCY: 2x/week   PT DURATION: 6 weeks   PLANNED INTERVENTIONS: Therapeutic exercises, Therapeutic activity, Neuromuscular re-education, Balance training, Gait training, Patient/Family education,  Joint mobilization, Electrical stimulation, Cryotherapy, Moist heat, and Manual therapy   PLAN FOR NEXT SESSION: Progress to full R shoulder ROM as tolerated. Continue with R shoulder AA/AROM and gradually progress to light RTC isotonics as tolerated. Progress with strengthening as AROM is restored. Recommend continued PT 2x/week for 6 weeks    Valentina Gu, PT, DPT #[id number]  Eilleen Kempf, PT 11/24/2022, 2:06 PM

## 2022-11-25 ENCOUNTER — Encounter: Payer: Self-pay | Admitting: Physical Therapy

## 2022-11-26 ENCOUNTER — Ambulatory Visit: Payer: Medicare Other | Attending: Orthopedic Surgery | Admitting: Physical Therapy

## 2022-11-26 ENCOUNTER — Encounter: Payer: Self-pay | Admitting: Physical Therapy

## 2022-11-26 DIAGNOSIS — M25611 Stiffness of right shoulder, not elsewhere classified: Secondary | ICD-10-CM | POA: Diagnosis present

## 2022-11-26 DIAGNOSIS — M25511 Pain in right shoulder: Secondary | ICD-10-CM | POA: Insufficient documentation

## 2022-11-26 DIAGNOSIS — M6281 Muscle weakness (generalized): Secondary | ICD-10-CM | POA: Insufficient documentation

## 2022-11-26 NOTE — Therapy (Signed)
OUTPATIENT PHYSICAL THERAPY TREATMENT    Patient Name: Shawn Atkins MRN: [id number] DOB:[birth year], 67 y.o., male Today's Date: 11/26/2022  PCP: Lynnell Jude, MD REFERRING PROVIDER: [name], MD  END OF SESSION:   PT End of Session - 11/26/22 1410     Visit Number 23    Number of Visits 30    Date for PT Re-Evaluation 12/07/22    Authorization Type [employer] Medicare, VL based on medical necessity    Authorization Time Period 08/18/23-10/26/22    Progress Note Due on Visit 10    PT Start Time 1346    PT Stop Time 1429    PT Time Calculation (min) 43 min    Activity Tolerance Patient limited by pain;Patient tolerated treatment well    Behavior During Therapy WFL for tasks assessed/performed             Past Medical History:  Diagnosis Date   BPH (benign prostatic hypertrophy)    Bronchitis    Chronic prostatitis    Complication of anesthesia    Felt like "couldn't breathe" after triple Hernia surgery   Diabetes mellitus without complication (HCC)    Flank pain    GERD (gastroesophageal reflux disease)    h/o   Gross hematuria    History of kidney stones    HTN (hypertension)    pt states he takes lisinopril for kidney protection due to dm not htn   Inguinal hernia    Nocturia    OSA on CPAP    with 02 2 L   Overweight    PONV (postoperative nausea and vomiting)    only during kidney stone surgery   Spermatocele    Stricture of urethra    Vertigo    1 episode, 6-7 yrs ago   Past Surgical History:  Procedure Laterality Date   BICEPT TENODESIS Right 08/06/2022   Procedure: BICEPS TENODESIS;  Surgeon: [name], MD;  Location: [facility];  Service: Orthopedics;  Laterality: Right;   CATARACT EXTRACTION W/PHACO Left 07/22/2020   Procedure: CATARACT EXTRACTION PHACO AND INTRAOCULAR LENS PLACEMENT ([facility]) LEFT DIABETIC 4.14  00:33.0;  Surgeon: Eulogio Bear, MD;  Location: [place];  Service: Ophthalmology;  Laterality: Left;  Diabetic - oral  meds   CATARACT EXTRACTION W/PHACO Right 08/12/2020   Procedure: CATARACT EXTRACTION PHACO AND INTRAOCULAR LENS PLACEMENT ([place]) RIGHT DIABETIC;  Surgeon: Eulogio Bear, MD;  Location: [address];  Service: Ophthalmology;  Laterality: Right;  1.99 0:26.2   COLONOSCOPY WITH PROPOFOL N/A 11/20/2015   Procedure: COLONOSCOPY WITH PROPOFOL;  Surgeon: Christene Lye, MD;  Location: [facility];  Service: Endoscopy;  Laterality: N/A;   HERNIA REPAIR Bilateral 6283   umbilical and bil inguinal/ Dr Marina Gravel   KIDNEY STONE SURGERY     KNEE ARTHROSCOPY Left    open lithotomy     SHOULDER ARTHROSCOPY WITH OPEN ROTATOR CUFF REPAIR AND DISTAL CLAVICLE ACROMINECTOMY Right 08/06/2022   Procedure: SHOULDER ARTHROSCOPY WITH OPEN ROTATOR CUFF REPAIR AND DISTAL CLAVICLE ACROMINECTOMY;  Surgeon: [name], MD;  Location: [facility];  Service: Orthopedics;  Laterality: Right;   Patient Active Problem List   Diagnosis Date Noted   History of rectal bleeding 06/01/2018   Left groin pain 03/18/2018   Sacroiliac joint pain 01/13/2017   Abdominal pain, left lower quadrant 08/23/2015   BPH with obstruction/lower urinary tract symptoms 08/23/2015   Biceps tendinitis 06/25/2015    REFERRING DIAG: M75.101 Unspecified rotator cuff tear or rupture of right shoulder,  not specified as traumatic  THERAPY DIAG:  Acute pain of right shoulder  Stiffness of right shoulder, not elsewhere classified  Muscle weakness (generalized)  Rationale for Evaluation and Treatment Rehabilitation   PERTINENT HISTORY: Pt is a 67 year old male s/p R shoulder rotator cuff repair and distal clavicle acromionectomy with Dr. Mack Guise on 08/06/22. Patient reports that his surgeon informed him he did have full tear of rotator cuff in surgery. Patient reports 4 days of severe pain just out of surgery. Patient reports managing pain well with Tylenol/Ibuprofen well now. Pt followed up with [facility] for erythema and  increased temperature as well as notable edema; ruled out infection and DVT. Patient reports comorbid neck and R upper trap pain. Pt reports difficulty with sleep positioning due to comorbid neck pain.    Pain:  Pain Intensity: Present: 2-3/10, Best: 2/10, Worst: 5-6/10 Pain location: R shoulder along deltoid and biceps Pain Quality: steady pain, intense intermittently; sharp pain along ACJ that comes and goes Radiating: Yes , to anterior arm Numbness/Tingling: No Aggravating factors: moving his R arm Relieving factors: Ibuprofen, Tylenol, resting arm/arm propped on armrest  History of prior shoulder or neck/shoulder injury, pain, surgery, or therapy: Yes, L shoulder with hx of cortisone injections, Hx of neck injury/PT for his neck Falls: Has patient fallen in last 6 months? No Dominant hand: left Imaging: Yes , MRI pre-operatively Prior level of function: Independent Occupational demands: Pt works part-time with [employer] (20 hrs/week), pt out of work now on medical leave Hobbies: [employer] (personal history of cancer, chills/fever, night sweats, nausea, vomiting, unrelenting pain): Negative   Precautions: None   Weight Bearing Restrictions: No   Living Environment Lives with: lives with their spouse Lives in: House/apartment     Patient Goals: Able to pick up weight with his arm, fish; able to play with baseball/bat with his grandson    PRECAUTIONS: No R shoulder AROM, no RUE lifting   DOS: 08/06/22, s/p R large RTC repair, R distal acromionectomy -8 weeks post-op 10/01/22 -12 weeks post-op 10/29/22   SUBJECTIVE:                                                                                                                                                                                      SUBJECTIVE STATEMENT:  Patient reports pain in R paracervical region/upper trapezius mainly at arrival to PT. He denies notable shoulder pain. He reports paresthesia still affecting R  styloid process region.    PAIN:  Are you having pain? Yes: NPRS scale: 3/10 pain at arrival  Pain location: pain into R paracervical/upper trap region mainly     OBJECTIVE: (objective measures completed  at initial evaluation unless otherwise dated)     Patient Surveys  FOTO: 4, predicted outcome score of 49     Cognition Patient is oriented to person, place, and time.  Recent memory is intact.  Remote memory is intact.  Attention span and concentration are intact.  Expressive speech is intact.  Patient's fund of knowledge is within normal limits for educational level.                          Gross Musculoskeletal Assessment Tremor: None Bulk: Atrophy of deltoid/posterior cuff mm Tone: Normal   Observation No sign of acute infection, mild erythema along R upper arm, mild increased tissue temperature along R middle deltoid         Posture FHRS posture     AROM         AROM (Normal range in degrees) AROM 08/17/2022 AROM 11/26/2022    Right Left Right Left  Shoulder        Flexion Deferred   125 155  Extension Deferred      Abduction Deferred   128 165  External Rotation Deferred   80 WNL  Internal Rotation Deferred   50 WNL  Hands Behind Head Deferred   C7 T3  Hands Behind Back Deferred   L3 T12           Elbow        Flexion WNL      Extension WNL      Pronation 50      Supination 60      (* = pain; Blank rows = not tested)     PROM       PROM (Normal range in degrees) PROM 08/17/2022 PROM 11/17/22    Right Left Right Left  Shoulder        Flexion 40    160  Extension        Abduction     108 WNL  External Rotation (arm at side) To neutral/0 deg    WNL  Internal Rotation (arm at side) 45    WNL        UE MMT:  MMT (out of 5) Right 11/26/2022 Left 11/26/2022         Shoulder   Flexion 3+  5   Extension      Abduction 4-   4+  External rotation  4-  4+  Internal rotation  4 5   Horizontal abduction      Horizontal adduction       Lower Trapezius      Rhomboids             Elbow  Flexion  4+ 5  Extension  4+  5  Pronation      Supination             Wrist  Flexion      Extension      Radial deviation      Ulnar deviation      (* = pain; Blank rows = not tested)     Sensation Deferred   Reflexes Deferred     Palpation   Location LEFT  RIGHT           Subocciptials      Cervical paraspinals      Upper Trapezius      Levator Scapulae      Rhomboid Major/Minor      Sternoclavicular joint  Acromioclavicular joint   2  Coracoid process   1  Long head of biceps   1  Supraspinatus   2  Infraspinatus   1  Subscapularis      Teres Minor      Teres Major      Pectoralis Major      Pectoralis Minor      Anterior Deltoid   1  Lateral Deltoid   2  Posterior Deltoid      Latissimus Dorsi      Sternocleidomastoid      (Blank rows = not tested) Graded on 0-4 scale (0 = no pain, 1 = pain, 2 = pain with wincing/grimacing/flinching, 3 = pain with withdrawal, 4 = unwilling to allow palpation), (Blank rows = not tested)       TODAY'S TREATMENT   Manual Therapy - for R shoulder ROM and to prevent R shoulder stiffness; manual perturbations for re-training of stabilizers of GHJ   R shoulder PROM flexion, abduction, ER/IR within pt tolerance with gentle overpressure as able x 15 minutes  Gentle STM to R anterior and middle deltoid, biceps, upper trapezius; x 2 minutes   *next visit* Rhythmic stabilization; arm at 100 deg forward elevation; 1 x 2 minutes (moderate perturbations)  *not today* DTM/TPR R UT; x 3 minutes DTM/Tpr R upper trapezius; x 3 minutes     Therapeutic Exercise - for shoulder ROM as needed for reaching and grasping tasks, self-care ADLs, and household chores   Wand ER in standing, 90 deg abduction with arm propped on railing; 2x10, 3 sec Sidelying ER AROM; 2x10, 2-lb Dbell -verbal and tactile cueing for technique Serratus slide, at wall on foam roller; 2x10, 5 sec hold  at top  Standing Tband row, Blue Tband; 2x12   *not today* Sidelying shoulder abduction; 2x10, ROM as tolerated  Finger ladder, x10, 5 sec hold at top for forward flexion  Supine flexion AROM; 2x10 Pulleys; shoulder complex abduction ; x 2 minutes Wand ER AAROM; 2x10 Table slide PROM Flexion x15 Table slide PROM Abd x15 Gripping; pink Theraputty; composite fist, performed x 2 minutes Wrist AROM; flexion/extension, RD/UD, pronation/supination; reviewed for HEP    Cold pack (unbilled) - for anti-inflammatory and analgesic effect as needed for reduced pain and improved ability to participate in active PT intervention, along R shoulder in sitting, pillow propped under R forearm for upper limb support, x 5 minutes    PATIENT EDUCATION:  Education details: Plan of care Person educated: Patient Education method: Explanation Education comprehension: verbalized understanding     HOME EXERCISE PROGRAM: Access Code: [email or web address] URL: [email or web address] Date: 10/10/2022 Prepared by: Valentina Gu  Exercises - Flexion-Extension Shoulder Pendulum with Table Support  - 2 x daily - 7 x weekly - 3 sets - 10 reps - Seated Scapular Retraction  - 2 x daily - 7 x weekly - 2 sets - 10 reps - 3sec hold - Supine Shoulder Flexion Extension AAROM with Dowel  - 2 x daily - 7 x weekly - 2 sets - 10 reps - Supine Shoulder External Rotation in 45 Degrees Abduction AAROM with Dowel  - 2 x daily - 7 x weekly - 2 sets - 10 reps - Seated Shoulder Flexion AAROM with Pulley Behind  - 2 x daily - 7 x weekly - 2 sets - 10 reps     ASSESSMENT:   CLINICAL IMPRESSION: Patient has gradually improving ROM of post-operative shoulder; he is behind on attainment of normal ROM given time out  of PT with comorbid neck pain and cervical radicular syndrome. He tolerates progression of posterior cuff and periscapular isotonics well without significant pain. Patient has ongoing post-operative impairments in R  shoulder AROM and PROM, post-operative pain, decreased strength, R shoulder/arm pain. Patient will benefit from continued skilled PT to address above impairments and improve overall function.   REHAB POTENTIAL: Good   CLINICAL DECISION MAKING: Stable/uncomplicated   EVALUATION COMPLEXITY: Low     GOALS: Goals reviewed with patient? Yes   SHORT TERM GOALS: Target date: 09/14/2022   Pt will be independent with HEP to minimize adverse effects of immobilization and gently mobilize R shoulder to improve pain-free function at home and work. Baseline: 08/17/22: Baseline HEP initiated.   11/17/22:  Pt is compliant with HEP and verbalizes understanding of given exercises Goal status: ACHIEVED     LONG TERM GOALS: Target date: 12/07/22   Pt will increase FOTO to at least 56 to demonstrate significant improvement in function at home and work related to neck pain  Baseline: 08/17/22: 4.   11/17/22: 61/56 Goal status: ACHIEVED   2.  Pt will decrease worst shoulder pain by at least 3 points on the NPRS in order to demonstrate clinically significant reduction in shoulder pain. Baseline: 08/17/22: 5-6/10 at worst over last week.   09/23/22: 2/10.   11/17/22: 2-3/10 at worst Goal status: IN PROGRESS    3.   Pt will have R shoulder AROM at least within 10 degrees of contralateral upper extremity indicative of improved ROM as needed for reaching, overhead work, self-care activities/dressing/grooming Baseline: 08/17/22: NO AROM presently, severely limited PROM in early post-op phase.  09/23/22: AROM not appropriate yet at this point post-operatively   11/17/22: Motion loss remains in all planes of motion  Goal status: NOT MET   4.   Pt will have R shoulder MMTs at 4+/5 or greater for all directions performed indicative of improved strength as needed for completion of daily lifting, reaching, carrying tasks (e.g. 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  09/23/22: still deferred at this point post-operatively.   11/17/22: MMT for R shoulder 3+ to 4 (see chart above) Goal status: ON-GOING   5.   Pt will perform unilateral farmer's carry with 15 lbs for 100 ft without reproduction of pain simulating carrying items in [employer] including carrying trash bag  Baseline: 08/17/22: Unable to perform lifting/carrying.  09/23/22: unable.   11/17/22: Deferred to later date Goal status: DEFERRED     PLAN: PT FREQUENCY: 2x/week   PT DURATION: 6 weeks   PLANNED INTERVENTIONS: Therapeutic exercises, Therapeutic activity, Neuromuscular re-education, Balance training, Gait training, Patient/Family education,  Joint mobilization, Electrical stimulation, Cryotherapy, Moist heat, and Manual therapy   PLAN FOR NEXT SESSION: Progress to full R shoulder ROM as tolerated. Continue with R shoulder AA/AROM and gradually progress to light RTC isotonics as tolerated. Progress with strengthening as AROM is restored. Recommend continued PT 2x/week for 6 weeks    Valentina Gu, PT, DPT #[id number]  Eilleen Kempf, PT 11/26/2022, 2:11 PM

## 2022-12-01 ENCOUNTER — Ambulatory Visit: Payer: Medicare Other | Admitting: Physical Therapy

## 2022-12-01 ENCOUNTER — Encounter: Payer: Self-pay | Admitting: Physical Therapy

## 2022-12-01 DIAGNOSIS — M25511 Pain in right shoulder: Secondary | ICD-10-CM

## 2022-12-01 DIAGNOSIS — M6281 Muscle weakness (generalized): Secondary | ICD-10-CM

## 2022-12-01 DIAGNOSIS — M25611 Stiffness of right shoulder, not elsewhere classified: Secondary | ICD-10-CM

## 2022-12-01 NOTE — Therapy (Unsigned)
OUTPATIENT PHYSICAL THERAPY TREATMENT    Patient Name: Shawn Atkins MRN: [id number] DOB:Oct 09, 1956, 67 y.o., male Today's Date: 12/01/2022  PCP: Lynnell Jude, MD REFERRING PROVIDER: [name], MD  END OF SESSION:     Past Medical History:  Diagnosis Date   BPH (benign prostatic hypertrophy)    Bronchitis    Chronic prostatitis    Complication of anesthesia    Felt like "couldn't breathe" after triple Hernia surgery   Diabetes mellitus without complication ([place])    Flank pain    GERD (gastroesophageal reflux disease)    h/o   Gross hematuria    History of kidney stones    HTN (hypertension)    pt states he takes lisinopril for kidney protection due to dm not htn   Inguinal hernia    Nocturia    OSA on CPAP    with 02 2 L   Overweight    PONV (postoperative nausea and vomiting)    only during kidney stone surgery   Spermatocele    Stricture of urethra    Vertigo    1 episode, 6-7 yrs ago   Past Surgical History:  Procedure Laterality Date   BICEPT TENODESIS Right 08/06/2022   Procedure: BICEPS TENODESIS;  Surgeon: [name], MD;  Location: [facility];  Service: Orthopedics;  Laterality: Right;   CATARACT EXTRACTION W/PHACO Left 07/22/2020   Procedure: CATARACT EXTRACTION PHACO AND INTRAOCULAR LENS PLACEMENT (I[facility]) LEFT DIABETIC 4.14  00:33.0;  Surgeon: Eulogio Bear, MD;  Location: [facility];  Service: Ophthalmology;  Laterality: Left;  Diabetic - oral meds   CATARACT EXTRACTION W/PHACO Right 08/12/2020   Procedure: CATARACT EXTRACTION PHACO AND INTRAOCULAR LENS PLACEMENT ([place]) RIGHT DIABETIC;  Surgeon: Eulogio Bear, MD;  Location: [place];  Service: Ophthalmology;  Laterality: Right;  1.99 0:26.2   COLONOSCOPY WITH PROPOFOL N/A 11/20/2015   Procedure: COLONOSCOPY WITH PROPOFOL;  Surgeon: Christene Lye, MD;  Location: [facility];  Service: Endoscopy;  Laterality: N/A;   HERNIA REPAIR Bilateral 7654   umbilical  and bil inguinal/ Dr Marina Gravel   KIDNEY STONE SURGERY     KNEE ARTHROSCOPY Left    open lithotomy     SHOULDER ARTHROSCOPY WITH OPEN ROTATOR CUFF REPAIR AND DISTAL CLAVICLE ACROMINECTOMY Right 08/06/2022   Procedure: SHOULDER ARTHROSCOPY WITH OPEN ROTATOR CUFF REPAIR AND DISTAL CLAVICLE ACROMINECTOMY;  Surgeon: [name], MD;  Location: [facility];  Service: Orthopedics;  Laterality: Right;   Patient Active Problem List   Diagnosis Date Noted   History of rectal bleeding 06/01/2018   Left groin pain 03/18/2018   Sacroiliac joint pain 01/13/2017   Abdominal pain, left lower quadrant 08/23/2015   BPH with obstruction/lower urinary tract symptoms 08/23/2015   Biceps tendinitis 06/25/2015    REFERRING DIAG: M75.101 Unspecified rotator cuff tear or rupture of right shoulder, not specified as traumatic  THERAPY DIAG:  Acute pain of right shoulder  Stiffness of right shoulder, not elsewhere classified  Muscle weakness (generalized)  Rationale for Evaluation and Treatment Rehabilitation   PERTINENT HISTORY: Pt is a 67 year old male s/p R shoulder rotator cuff repair and distal clavicle acromionectomy with Dr. Mack Guise on 08/06/22. Patient reports that his surgeon informed him he did have full tear of rotator cuff in surgery. Patient reports 4 days of severe pain just out of surgery. Patient reports managing pain well with Tylenol/Ibuprofen well now. Pt followed up with [facility] for erythema and increased temperature as well as notable edema; ruled out infection and  DVT. Patient reports comorbid neck and R upper trap pain. Pt reports difficulty with sleep positioning due to comorbid neck pain.    Pain:  Pain Intensity: Present: 2-3/10, Best: 2/10, Worst: 5-6/10 Pain location: R shoulder along deltoid and biceps Pain Quality: steady pain, intense intermittently; sharp pain along ACJ that comes and goes Radiating: Yes , to anterior arm Numbness/Tingling: No Aggravating factors: moving  his R arm Relieving factors: Ibuprofen, Tylenol, resting arm/arm propped on armrest  History of prior shoulder or neck/shoulder injury, pain, surgery, or therapy: Yes, L shoulder with hx of cortisone injections, Hx of neck injury/PT for his neck Falls: Has patient fallen in last 6 months? No Dominant hand: left Imaging: Yes , MRI pre-operatively Prior level of function: Independent Occupational demands: Pt works part-time with [employer] (20 hrs/week), pt out of work now on medical leave Hobbies: [employer] (personal history of cancer, chills/fever, night sweats, nausea, vomiting, unrelenting pain): Negative   Precautions: None   Weight Bearing Restrictions: No   Living Environment Lives with: lives with their spouse Lives in: House/apartment     Patient Goals: Able to pick up weight with his arm, fish; able to play with baseball/bat with his grandson    PRECAUTIONS: No R shoulder AROM, no RUE lifting   DOS: 08/06/22, s/p R large RTC repair, R distal acromionectomy -8 weeks post-op 10/01/22 -12 weeks post-op 10/29/22   SUBJECTIVE:                                                                                                                                                                                      SUBJECTIVE STATEMENT:  Patient reports pain in R paracervical region/upper trapezius mainly at arrival to PT. He denies notable shoulder pain. He reports paresthesia still affecting R styloid process region.    PAIN:  Are you having pain? Yes: NPRS scale: 3/10 pain at arrival  Pain location: pain into R paracervical/upper trap region mainly     OBJECTIVE: (objective measures completed at initial evaluation unless otherwise dated)     Patient Surveys  FOTO: 4, predicted outcome score of 56     Cognition Patient is oriented to person, place, and time.  Recent memory is intact.  Remote memory is intact.  Attention span and concentration are intact.  Expressive  speech is intact.  Patient's fund of knowledge is within normal limits for educational level.                          Gross Musculoskeletal Assessment Tremor: None Bulk: Atrophy of deltoid/posterior cuff mm Tone: Normal   Observation No sign of acute  infection, mild erythema along R upper arm, mild increased tissue temperature along R middle deltoid         Posture FHRS posture     AROM         AROM (Normal range in degrees) AROM 08/17/2022 AROM 12/01/2022    Right Left Right Left  Shoulder        Flexion Deferred   125 155  Extension Deferred      Abduction Deferred   128 165  External Rotation Deferred   80 WNL  Internal Rotation Deferred   50 WNL  Hands Behind Head Deferred   C7 T3  Hands Behind Back Deferred   L3 T12           Elbow        Flexion WNL      Extension WNL      Pronation 50      Supination 60      (* = pain; Blank rows = not tested)     PROM       PROM (Normal range in degrees) PROM 08/17/2022 PROM 11/17/22    Right Left Right Left  Shoulder        Flexion 40    160  Extension        Abduction     108 WNL  External Rotation (arm at side) To neutral/0 deg    WNL  Internal Rotation (arm at side) 45    WNL        UE MMT:  MMT (out of 5) Right 12/01/2022 Left 12/01/2022         Shoulder   Flexion 3+  5   Extension      Abduction 4-   4+  External rotation  4-  4+  Internal rotation  4 5   Horizontal abduction      Horizontal adduction      Lower Trapezius      Rhomboids             Elbow  Flexion  4+ 5  Extension  4+  5  Pronation      Supination             Wrist  Flexion      Extension      Radial deviation      Ulnar deviation      (* = pain; Blank rows = not tested)     Sensation Deferred   Reflexes Deferred     Palpation   Location LEFT  RIGHT           Subocciptials      Cervical paraspinals      Upper Trapezius      Levator Scapulae      Rhomboid Major/Minor      Sternoclavicular joint       Acromioclavicular joint   2  Coracoid process   1  Long head of biceps   1  Supraspinatus   2  Infraspinatus   1  Subscapularis      Teres Minor      Teres Major      Pectoralis Major      Pectoralis Minor      Anterior Deltoid   1  Lateral Deltoid   2  Posterior Deltoid      Latissimus Dorsi      Sternocleidomastoid      (Blank rows = not tested) Graded on 0-4 scale (0 = no pain, 1 =  pain, 2 = pain with wincing/grimacing/flinching, 3 = pain with withdrawal, 4 = unwilling to allow palpation), (Blank rows = not tested)       TODAY'S TREATMENT   Manual Therapy - for R shoulder ROM and to prevent R shoulder stiffness; manual perturbations for re-training of stabilizers of GHJ   R shoulder PROM flexion, abduction, ER/IR within pt tolerance with gentle overpressure as able x 15 minutes  Gentle STM to R anterior and middle deltoid, biceps, upper trapezius; x 2 minutes   *next visit* Rhythmic stabilization; arm at 100 deg forward elevation; 1 x 2 minutes (moderate perturbations)  *not today* DTM/TPR R UT; x 3 minutes DTM/Tpr R upper trapezius; x 3 minutes     Therapeutic Exercise - for shoulder ROM as needed for reaching and grasping tasks, self-care ADLs, and household chores   Wand ER in standing, 90 deg abduction with arm propped on railing; 2x10, 3 sec Sidelying ER AROM; 2x10, 2-lb Dbell -verbal and tactile cueing for technique Serratus slide, at wall on foam roller; 2x10, 5 sec hold at top  Standing Tband row, Blue Tband; 2x12   *not today* Sidelying shoulder abduction; 2x10, ROM as tolerated  Finger ladder, x10, 5 sec hold at top for forward flexion  Supine flexion AROM; 2x10 Pulleys; shoulder complex abduction ; x 2 minutes Wand ER AAROM; 2x10 Table slide PROM Flexion x15 Table slide PROM Abd x15 Gripping; pink Theraputty; composite fist, performed x 2 minutes Wrist AROM; flexion/extension, RD/UD, pronation/supination; reviewed for HEP    Cold pack  (unbilled) - for anti-inflammatory and analgesic effect as needed for reduced pain and improved ability to participate in active PT intervention, along R shoulder in sitting, pillow propped under R forearm for upper limb support, x 5 minutes    PATIENT EDUCATION:  Education details: Plan of care Person educated: Patient Education method: Explanation Education comprehension: verbalized understanding     HOME EXERCISE PROGRAM: Access Code: [email or web address] URL: [email or web address] Date: 10/10/2022 Prepared by: Valentina Gu  Exercises - Flexion-Extension Shoulder Pendulum with Table Support  - 2 x daily - 7 x weekly - 3 sets - 10 reps - Seated Scapular Retraction  - 2 x daily - 7 x weekly - 2 sets - 10 reps - 3sec hold - Supine Shoulder Flexion Extension AAROM with Dowel  - 2 x daily - 7 x weekly - 2 sets - 10 reps - Supine Shoulder External Rotation in 45 Degrees Abduction AAROM with Dowel  - 2 x daily - 7 x weekly - 2 sets - 10 reps - Seated Shoulder Flexion AAROM with Pulley Behind  - 2 x daily - 7 x weekly - 2 sets - 10 reps     ASSESSMENT:   CLINICAL IMPRESSION: Patient has gradually improving ROM of post-operative shoulder; he is behind on attainment of normal ROM given time out of PT with comorbid neck pain and cervical radicular syndrome. He tolerates progression of posterior cuff and periscapular isotonics well without significant pain. Patient has ongoing post-operative impairments in R shoulder AROM and PROM, post-operative pain, decreased strength, R shoulder/arm pain. Patient will benefit from continued skilled PT to address above impairments and improve overall function.   REHAB POTENTIAL: Good   CLINICAL DECISION MAKING: Stable/uncomplicated   EVALUATION COMPLEXITY: Low     GOALS: Goals reviewed with patient? Yes   SHORT TERM GOALS: Target date: 09/14/2022   Pt will be independent with HEP to minimize adverse effects of immobilization and gently  mobilize R shoulder to  improve pain-free function at home and work. Baseline: 08/17/22: Baseline HEP initiated.   11/17/22:  Pt is compliant with HEP and verbalizes understanding of given exercises Goal status: ACHIEVED     LONG TERM GOALS: Target date: 12/07/22   Pt will increase FOTO to at least 56 to demonstrate significant improvement in function at home and work related to neck pain  Baseline: 08/17/22: 4.   11/17/22: 61/56 Goal status: ACHIEVED   2.  Pt will decrease worst shoulder pain by at least 3 points on the NPRS in order to demonstrate clinically significant reduction in shoulder pain. Baseline: 08/17/22: 5-6/10 at worst over last week.   09/23/22: 2/10.   11/17/22: 2-3/10 at worst Goal status: IN PROGRESS    3.   Pt will have R shoulder AROM at least within 10 degrees of contralateral upper extremity indicative of improved ROM as needed for reaching, overhead work, self-care activities/dressing/grooming Baseline: 08/17/22: NO AROM presently, severely limited PROM in early post-op phase.  09/23/22: AROM not appropriate yet at this point post-operatively   11/17/22: Motion loss remains in all planes of motion  Goal status: NOT MET   4.   Pt will have R shoulder MMTs at 4+/5 or greater for all directions performed indicative of improved strength as needed for completion of daily lifting, reaching, carrying tasks (e.g. 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  09/23/22: still deferred at this point post-operatively.   11/17/22: MMT for R shoulder 3+ to 4 (see chart above) Goal status: ON-GOING   5.   Pt will perform unilateral farmer's carry with 15 lbs for 100 ft without reproduction of pain simulating carrying items in [employer] including carrying trash bag  Baseline: 08/17/22: Unable to perform lifting/carrying.  09/23/22: unable.   11/17/22: Deferred to later date Goal status: DEFERRED     PLAN: PT  FREQUENCY: 2x/week   PT DURATION: 6 weeks   PLANNED INTERVENTIONS: Therapeutic exercises, Therapeutic activity, Neuromuscular re-education, Balance training, Gait training, Patient/Family education,  Joint mobilization, Electrical stimulation, Cryotherapy, Moist heat, and Manual therapy   PLAN FOR NEXT SESSION: Progress to full R shoulder ROM as tolerated. Continue with R shoulder AA/AROM and gradually progress to light RTC isotonics as tolerated. Progress with strengthening as AROM is restored. Recommend continued PT 2x/week for 6 weeks    Valentina Gu, PT, DPT #[id number]  Eilleen Kempf, PT 12/01/2022, 11:09 AM

## 2022-12-03 ENCOUNTER — Ambulatory Visit: Payer: Medicare Other | Admitting: Physical Therapy

## 2022-12-07 ENCOUNTER — Ambulatory Visit: Payer: Medicare Other | Admitting: Urology

## 2022-12-08 ENCOUNTER — Ambulatory Visit: Payer: Medicare Other | Admitting: Physical Therapy

## 2022-12-08 DIAGNOSIS — M25511 Pain in right shoulder: Secondary | ICD-10-CM | POA: Diagnosis not present

## 2022-12-08 DIAGNOSIS — M25611 Stiffness of right shoulder, not elsewhere classified: Secondary | ICD-10-CM

## 2022-12-08 DIAGNOSIS — M6281 Muscle weakness (generalized): Secondary | ICD-10-CM

## 2022-12-08 NOTE — Therapy (Unsigned)
OUTPATIENT PHYSICAL THERAPY TREATMENT AND GOAL UPDATE    Patient Name: Shawn Atkins MRN: [id number] DOB:02/12/1956, 67 y.o., male Today's Date: 12/01/2022  PCP: Lynnell Jude, MD REFERRING PROVIDER: [name], MD  END OF SESSION:      Past Medical History:  Diagnosis Date   BPH (benign prostatic hypertrophy)    Bronchitis    Chronic prostatitis    Complication of anesthesia    Felt like "couldn't breathe" after triple Hernia surgery   Diabetes mellitus without complication ([place])    Flank pain    GERD (gastroesophageal reflux disease)    h/o   Gross hematuria    History of kidney stones    HTN (hypertension)    pt states he takes lisinopril for kidney protection due to dm not htn   Inguinal hernia    Nocturia    OSA on CPAP    with 02 2 L   Overweight    PONV (postoperative nausea and vomiting)    only during kidney stone surgery   Spermatocele    Stricture of urethra    Vertigo    1 episode, 6-7 yrs ago   Past Surgical History:  Procedure Laterality Date   BICEPT TENODESIS Right 08/06/2022   Procedure: BICEPS TENODESIS;  Surgeon: [name], MD;  Location: [facility];  Service: Orthopedics;  Laterality: Right;   CATARACT EXTRACTION W/PHACO Left 07/22/2020   Procedure: CATARACT EXTRACTION PHACO AND INTRAOCULAR LENS PLACEMENT ([facility]) LEFT DIABETIC 4.14  00:33.0;  Surgeon: Eulogio Bear, MD;  Location: [place];  Service: Ophthalmology;  Laterality: Left;  Diabetic - oral meds   CATARACT EXTRACTION W/PHACO Right 08/12/2020   Procedure: CATARACT EXTRACTION PHACO AND INTRAOCULAR LENS PLACEMENT ([place]) RIGHT DIABETIC;  Surgeon: Eulogio Bear, MD;  Location: [place];  Service: Ophthalmology;  Laterality: Right;  1.99 0:26.2   COLONOSCOPY WITH PROPOFOL N/A 11/20/2015   Procedure: COLONOSCOPY WITH PROPOFOL;  Surgeon: Christene Lye, MD;  Location: [facility];  Service: Endoscopy;  Laterality: N/A;   HERNIA REPAIR Bilateral  [id number]   umbilical and bil inguinal/ Dr Marina Gravel   KIDNEY STONE SURGERY     KNEE ARTHROSCOPY Left    open lithotomy     SHOULDER ARTHROSCOPY WITH OPEN ROTATOR CUFF REPAIR AND DISTAL CLAVICLE ACROMINECTOMY Right 08/06/2022   Procedure: SHOULDER ARTHROSCOPY WITH OPEN ROTATOR CUFF REPAIR AND DISTAL CLAVICLE ACROMINECTOMY;  Surgeon: [name], MD;  Location: [facility];  Service: Orthopedics;  Laterality: Right;   Patient Active Problem List   Diagnosis Date Noted   History of rectal bleeding 06/01/2018   Left groin pain 03/18/2018   Sacroiliac joint pain 01/13/2017   Abdominal pain, left lower quadrant 08/23/2015   BPH with obstruction/lower urinary tract symptoms 08/23/2015   Biceps tendinitis 06/25/2015    REFERRING DIAG: M75.101 Unspecified rotator cuff tear or rupture of right shoulder, not specified as traumatic  THERAPY DIAG:  Acute pain of right shoulder  Stiffness of right shoulder, not elsewhere classified  Muscle weakness (generalized)  Rationale for Evaluation and Treatment Rehabilitation   PERTINENT HISTORY: Pt is a 67 year old male s/p R shoulder rotator cuff repair and distal clavicle acromionectomy with Dr. Mack Guise on 08/06/22. Patient reports that his surgeon informed him he did have full tear of rotator cuff in surgery. Patient reports 4 days of severe pain just out of surgery. Patient reports managing pain well with Tylenol/Ibuprofen well now. Pt followed up with [facility] for erythema and increased temperature as well as notable edema;  ruled out infection and DVT. Patient reports comorbid neck and R upper trap pain. Pt reports difficulty with sleep positioning due to comorbid neck pain.    Pain:  Pain Intensity: Present: 2-3/10, Best: 2/10, Worst: 5-6/10 Pain location: R shoulder along deltoid and biceps Pain Quality: steady pain, intense intermittently; sharp pain along ACJ that comes and goes Radiating: Yes , to anterior arm Numbness/Tingling: No Aggravating  factors: moving his R arm Relieving factors: Ibuprofen, Tylenol, resting arm/arm propped on armrest  History of prior shoulder or neck/shoulder injury, pain, surgery, or therapy: Yes, L shoulder with hx of cortisone injections, Hx of neck injury/PT for his neck Falls: Has patient fallen in last 6 months? No Dominant hand: left Imaging: Yes , MRI pre-operatively Prior level of function: Independent Occupational demands: Pt works part-time with [employer] (20 hrs/week), pt out of work now on medical leave Hobbies: [employer] (personal history of cancer, chills/fever, night sweats, nausea, vomiting, unrelenting pain): Negative   Precautions: None   Weight Bearing Restrictions: No   Living Environment Lives with: lives with their spouse Lives in: House/apartment     Patient Goals: Able to pick up weight with his arm, fish; able to play with baseball/bat with his grandson    PRECAUTIONS: No R shoulder AROM, no RUE lifting   DOS: 08/06/22, s/p R large RTC repair, R distal acromionectomy -8 weeks post-op 10/01/22 -12 weeks post-op 10/29/22   Next f/u with MD on 12/30/22   SUBJECTIVE:                                                                                                                                                                                      SUBJECTIVE STATEMENT:  Patient reports having intermittent neck pain with moderate referral to his R shoulder. He reports feeling "not too bad" at arrival to PT. Pt reports 80% global improvement to date. He reports difficulty with "rotation to side" i.e. abduction and ER. He states he needs further work on full shoulder elevation. Patient is still limited with lifting and carrying. Pt reports getting his normal volume of sleep.  Pt has been able to perform vacuuming, cleaning, doing dishes at home.    PAIN:  Are you having pain? 1/10 pain at arrival Pain location: pain into R paracervical/upper trap region mainly      OBJECTIVE: (objective measures completed at initial evaluation unless otherwise dated)     Patient Surveys  FOTO: 4, predicted outcome score of 56     Cognition Patient is oriented to person, place, and time.  Recent memory is intact.  Remote memory is intact.  Attention span and concentration are intact.  Expressive speech is intact.  Patient's fund of knowledge is within normal limits for educational level.                          Gross Musculoskeletal Assessment Tremor: None Bulk: Atrophy of deltoid/posterior cuff mm Tone: Normal   Observation No sign of acute infection, mild erythema along R upper arm, mild increased tissue temperature along R middle deltoid     Posture FHRS posture     AROM           AROM (Normal range in degrees) AROM 08/17/2022 AROM 11/17/22 AROM 12/08/22    Right Left Right Left Right Left  Shoulder          Flexion Deferred   125 155 141   Extension Deferred        Abduction Deferred   128 165 147   External Rotation Deferred   80 WNL 87    Internal Rotation Deferred   50 WNL 70   Hands Behind Head Deferred   C7 T3 T2 T4  Hands Behind Back Deferred   L3 T12 L2 T12             Elbow          Flexion WNL        Extension WNL        Pronation 50        Supination 60        (* = pain; Blank rows = not tested)     PROM         PROM (Normal range in degrees) PROM 08/17/2022 PROM 11/17/22 PROM 12/08/22    Right Left Right Left Right Left  Shoulder          Flexion 40    160 157 166  Extension          Abduction     108 WNL 111   External Rotation (arm at side) To neutral/0 deg    WNL ER at 90 deg ABD: 60 deg   Internal Rotation (arm at side) 45    WNL IR at 90 degr ABD: 70 deg         UE MMT:  MMT (out of 5) Right 11/17/22 Left 11/17/22 Right 12/08/22 Left 12/08/22           Shoulder     Flexion 3+  5  4-   Extension        Abduction 4-   4+ 4-   External rotation  4-  4+ 4   Internal rotation  4 5  4+    Horizontal abduction        Horizontal adduction        Lower Trapezius        Rhomboids                 Elbow    Flexion  4+ 5 5   Extension  4+  5 5   Pronation        Supination                 Wrist    Flexion        Extension        Radial deviation        Ulnar deviation        (* = pain; Blank rows = not tested)     Sensation Deferred   Reflexes Deferred  Palpation   Location LEFT  RIGHT           Subocciptials      Cervical paraspinals      Upper Trapezius      Levator Scapulae      Rhomboid Major/Minor      Sternoclavicular joint      Acromioclavicular joint   2  Coracoid process   1  Long head of biceps   1  Supraspinatus   2  Infraspinatus   1  Subscapularis      Teres Minor      Teres Major      Pectoralis Major      Pectoralis Minor      Anterior Deltoid   1  Lateral Deltoid   2  Posterior Deltoid      Latissimus Dorsi      Sternocleidomastoid      (Blank rows = not tested) Graded on 0-4 scale (0 = no pain, 1 = pain, 2 = pain with wincing/grimacing/flinching, 3 = pain with withdrawal, 4 = unwilling to allow palpation), (Blank rows = not tested)       TODAY'S TREATMENT   Manual Therapy - for R shoulder ROM and to prevent R shoulder stiffness; manual perturbations for re-training of stabilizers of GHJ   R shoulder PROM flexion, abduction, ER/IR within pt tolerance with gentle overpressure as able x 15 minutes  Gentle STM to R anterior and middle deltoid, biceps, upper trapezius; x 2 minutes  DTM/TPR R UT; x 3 minutes  -applied Biofreeze to R paracervical musculature and R upper trapezius for analgesic effect   *next visit* Rhythmic stabilization; arm at 100 deg forward elevation; 1 x 2 minutes (moderate perturbations)   *not today* DTM/Tpr R upper trapezius; x 3 minutes     Therapeutic Exercise - for shoulder ROM as needed for reaching and grasping tasks, self-care ADLs, and household chores  Wall slide, flexion and  abduction; x10 ea dir, 5 sec hold  Wand ER in standing, 90 deg abduction with arm propped on railing; 2x10, 3 sec Sidelying ER AROM; 2x10, 2-lb Dbell -verbal and tactile cueing for technique Serratus slide, at wall on foam roller; 2x10, 5 sec hold at top  Standing Tband row, Blue Tband; 2x12   *not today* Sidelying shoulder abduction; 2x10, ROM as tolerated  Finger ladder, x10, 5 sec hold at top for forward flexion  Supine flexion AROM; 2x10 Pulleys; shoulder complex abduction ; x 2 minutes Wand ER AAROM; 2x10 Table slide PROM Flexion x15 Table slide PROM Abd x15 Gripping; pink Theraputty; composite fist, performed x 2 minutes Wrist AROM; flexion/extension, RD/UD, pronation/supination; reviewed for HEP    Cold pack (unbilled) - for anti-inflammatory and analgesic effect as needed for reduced pain and improved ability to participate in active PT intervention, along R shoulder in sitting, pillow propped under R forearm for upper limb support, x 5 minutes    PATIENT EDUCATION:  Education details: Plan of care Person educated: Patient Education method: Explanation Education comprehension: verbalized understanding     HOME EXERCISE PROGRAM: Access [email or web address] URL: [email or web address] Date: 10/10/2022 Prepared by: Valentina Gu  Exercises - Flexion-Extension Shoulder Pendulum with Table Support  - 2 x daily - 7 x weekly - 3 sets - 10 reps - Seated Scapular Retraction  - 2 x daily - 7 x weekly - 2 sets - 10 reps - 3sec hold - Supine Shoulder Flexion Extension AAROM with Dowel  - 2 x daily - 7 x  weekly - 2 sets - 10 reps - Supine Shoulder External Rotation in 45 Degrees Abduction AAROM with Dowel  - 2 x daily - 7 x weekly - 2 sets - 10 reps - Seated Shoulder Flexion AAROM with Pulley Behind  - 2 x daily - 7 x weekly - 2 sets - 10 reps     ASSESSMENT:   CLINICAL IMPRESSION: Patient has normalizing shoulder elevation PROM. He has mild deficits remaining  with passive ER and IR. Patient reports ongoing R paracervical pain with intermittent spasms and paresthesias affecting R styloid process region. Manual therapy was utilized to modulate pain and improve patient's ability to participate in active intervention. Pt exhibits improving active overhead mobility and ER at 90 deg abduction. Patient has ongoing post-operative impairments in R shoulder AROM and PROM, post-operative pain, decreased strength, R shoulder/arm pain. Patient will benefit from continued skilled PT to address above impairments and improve overall function.   REHAB POTENTIAL: Good   CLINICAL DECISION MAKING: Stable/uncomplicated   EVALUATION COMPLEXITY: Low     GOALS: Goals reviewed with patient? Yes   SHORT TERM GOALS: Target date: 09/14/2022   Pt will be independent with HEP to minimize adverse effects of immobilization and gently mobilize R shoulder to improve pain-free function at home and work. Baseline: 08/17/22: Baseline HEP initiated.   11/17/22:  Pt is compliant with HEP and verbalizes understanding of given exercises Goal status: ACHIEVED     LONG TERM GOALS: Target date: 12/07/22   Pt will increase FOTO to at least 56 to demonstrate significant improvement in function at home and work related to neck pain  Baseline: 08/17/22: 4.   11/17/22: 61/56 Goal status: ACHIEVED   2.  Pt will decrease worst shoulder pain by at least 3 points on the NPRS in order to demonstrate clinically significant reduction in shoulder pain. Baseline: 08/17/22: 5-6/10 at worst over last week.   09/23/22: 2/10.   11/17/22: 2-3/10 at worst.    12/08/22:  Goal status: IN PROGRESS    3.   Pt will have R shoulder AROM at least within 10 degrees of contralateral upper extremity indicative of improved ROM as needed for reaching, overhead work, self-care activities/dressing/grooming Baseline: 08/17/22: NO AROM presently, severely limited PROM in early post-op phase.  09/23/22: AROM not appropriate  yet at this point post-operatively   11/17/22: Motion loss remains in all planes of motion  Goal status: NOT MET   4.   Pt will have R shoulder MMTs at 4+/5 or greater for all directions performed indicative of improved strength as needed for completion of daily lifting, reaching, carrying tasks (e.g. 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  09/23/22: still deferred at this point post-operatively.   11/17/22: MMT for R shoulder 3+ to 4 (see chart above) Goal status: ON-GOING   5.   Pt will perform unilateral farmer's carry with 15 lbs for 100 ft without reproduction of pain simulating carrying items in [employer] including carrying trash bag  Baseline: 08/17/22: Unable to perform lifting/carrying.  09/23/22: unable.   11/17/22: Deferred to later date Goal status: DEFERRED     PLAN: PT FREQUENCY: 2x/week   PT DURATION: 6 weeks   PLANNED INTERVENTIONS: Therapeutic exercises, Therapeutic activity, Neuromuscular re-education, Balance training, Gait training, Patient/Family education,  Joint mobilization, Electrical stimulation, Cryotherapy, Moist heat, and Manual therapy   PLAN FOR NEXT SESSION: Progress to full R shoulder ROM as tolerated. Continue  with R shoulder AA/AROM and gradually progress to light RTC isotonics as tolerated. Progress with strengthening as AROM is restored. Recommend continued PT 2x/week for 6 weeks    Valentina Gu, PT, DPT [id number]  Eilleen Kempf, PT 12/08/2022, 10:10 AM

## 2022-12-09 ENCOUNTER — Encounter: Payer: Self-pay | Admitting: Physical Therapy

## 2022-12-10 ENCOUNTER — Ambulatory Visit: Payer: Medicare Other | Admitting: Physical Therapy

## 2022-12-10 DIAGNOSIS — M25511 Pain in right shoulder: Secondary | ICD-10-CM | POA: Diagnosis not present

## 2022-12-10 DIAGNOSIS — M6281 Muscle weakness (generalized): Secondary | ICD-10-CM

## 2022-12-10 DIAGNOSIS — M25611 Stiffness of right shoulder, not elsewhere classified: Secondary | ICD-10-CM

## 2022-12-10 NOTE — Therapy (Signed)
OUTPATIENT PHYSICAL THERAPY TREATMENT    Patient Name: Shawn Atkins MRN: [id number] DOB:[birth year], 67 y.o., male Today's Date: 12/10/2022  PCP: Lynnell Jude, MD REFERRING PROVIDER: [name], MD  END OF SESSION:   PT End of Session - 12/10/22 1346     Visit Number 26    Number of Visits 30    Date for PT Re-Evaluation 01/14/23    Authorization Type UHC Medicare, VL based on medical necessity    Authorization Time Period 08/18/23-10/26/22    Progress Note Due on Visit 30    PT Start Time 1345    PT Stop Time 1430    PT Time Calculation (min) 45 min    Activity Tolerance Patient limited by pain;Patient tolerated treatment well    Behavior During Therapy WFL for tasks assessed/performed                Past Medical History:  Diagnosis Date   BPH (benign prostatic hypertrophy)    Bronchitis    Chronic prostatitis    Complication of anesthesia    Felt like "couldn't breathe" after triple Hernia surgery   Diabetes mellitus without complication (HCC)    Flank pain    GERD (gastroesophageal reflux disease)    h/o   Gross hematuria    History of kidney stones    HTN (hypertension)    pt states he takes lisinopril for kidney protection due to dm not htn   Inguinal hernia    Nocturia    OSA on CPAP    with 02 2 L   Overweight    PONV (postoperative nausea and vomiting)    only during kidney stone surgery   Spermatocele    Stricture of urethra    Vertigo    1 episode, 6-7 yrs ago   Past Surgical History:  Procedure Laterality Date   BICEPT TENODESIS Right 08/06/2022   Procedure: BICEPS TENODESIS;  Surgeon: [name], MD;  Location: [facility];  Service: Orthopedics;  Laterality: Right;   CATARACT EXTRACTION W/PHACO Left 07/22/2020   Procedure: CATARACT EXTRACTION PHACO AND INTRAOCULAR LENS PLACEMENT (IOC) LEFT DIABETIC 4.14  00:33.0;  Surgeon: Eulogio Bear, MD;  Location: [place];  Service: Ophthalmology;  Laterality: Left;  Diabetic  - oral meds   CATARACT EXTRACTION W/PHACO Right 08/12/2020   Procedure: CATARACT EXTRACTION PHACO AND INTRAOCULAR LENS PLACEMENT ([place]) RIGHT DIABETIC;  Surgeon: Eulogio Bear, MD;  Location: [place];  Service: Ophthalmology;  Laterality: Right;  1.99 0:26.2   COLONOSCOPY WITH PROPOFOL N/A 11/20/2015   Procedure: COLONOSCOPY WITH PROPOFOL;  Surgeon: Christene Lye, MD;  Location: [facility];  Service: Endoscopy;  Laterality: N/A;   HERNIA REPAIR Bilateral [id number]   umbilical and bil inguinal/ Dr Marina Gravel   KIDNEY STONE SURGERY     KNEE ARTHROSCOPY Left    open lithotomy     SHOULDER ARTHROSCOPY WITH OPEN ROTATOR CUFF REPAIR AND DISTAL CLAVICLE ACROMINECTOMY Right 08/06/2022   Procedure: SHOULDER ARTHROSCOPY WITH OPEN ROTATOR CUFF REPAIR AND DISTAL CLAVICLE ACROMINECTOMY;  Surgeon: [name], MD;  Location: [facility];  Service: Orthopedics;  Laterality: Right;   Patient Active Problem List   Diagnosis Date Noted   History of rectal bleeding 06/01/2018   Left groin pain 03/18/2018   Sacroiliac joint pain 01/13/2017   Abdominal pain, left lower quadrant 08/23/2015   BPH with obstruction/lower urinary tract symptoms 08/23/2015   Biceps tendinitis 06/25/2015    REFERRING DIAG: M75.101 Unspecified rotator cuff tear or rupture  of right shoulder, not specified as traumatic  THERAPY DIAG:  Acute pain of right shoulder  Stiffness of right shoulder, not elsewhere classified  Muscle weakness (generalized)  Rationale for Evaluation and Treatment Rehabilitation   PERTINENT HISTORY: Pt is a 67 year old male s/p R shoulder rotator cuff repair and distal clavicle acromionectomy with Dr. Mack Guise on 08/06/22. Patient reports that his surgeon informed him he did have full tear of rotator cuff in surgery. Patient reports 4 days of severe pain just out of surgery. Patient reports managing pain well with Tylenol/Ibuprofen well now. Pt followed up with [facility] for erythema  and increased temperature as well as notable edema; ruled out infection and DVT. Patient reports comorbid neck and R upper trap pain. Pt reports difficulty with sleep positioning due to comorbid neck pain.    Pain:  Pain Intensity: Present: 2-3/10, Best: 2/10, Worst: 5-6/10 Pain location: R shoulder along deltoid and biceps Pain Quality: steady pain, intense intermittently; sharp pain along ACJ that comes and goes Radiating: Yes , to anterior arm Numbness/Tingling: No Aggravating factors: moving his R arm Relieving factors: Ibuprofen, Tylenol, resting arm/arm propped on armrest  History of prior shoulder or neck/shoulder injury, pain, surgery, or therapy: Yes, L shoulder with hx of cortisone injections, Hx of neck injury/PT for his neck Falls: Has patient fallen in last 6 months? No Dominant hand: left Imaging: Yes , MRI pre-operatively Prior level of function: Independent Occupational demands: Pt works part-time with [employer] (20 hrs/week), pt out of work now on medical leave Hobbies: [employer] (personal history of cancer, chills/fever, night sweats, nausea, vomiting, unrelenting pain): Negative   Precautions: None   Weight Bearing Restrictions: No   Living Environment Lives with: lives with their spouse Lives in: House/apartment     Patient Goals: Able to pick up weight with his arm, fish; able to play with baseball/bat with his grandson    PRECAUTIONS: No R shoulder AROM, no RUE lifting   DOS: 08/06/22, s/p R large RTC repair, R distal acromionectomy -8 weeks post-op 10/01/22 -12 weeks post-op 10/29/22   Next f/u with MD on 12/30/22   SUBJECTIVE:                                                                                                                                                                                      SUBJECTIVE STATEMENT:  Patient reports some soreness after PT. He feels that he did okay after last visit with use of cold pack prn. Pt is compliant  with his HEP.    PAIN:  Are you having pain? No pain reported at arrival Pain location: pain into R paracervical/upper trap region mainly  OBJECTIVE: (objective measures completed at initial evaluation unless otherwise dated)     Patient Surveys  FOTO: 4, predicted outcome score of 45     Cognition Patient is oriented to person, place, and time.  Recent memory is intact.  Remote memory is intact.  Attention span and concentration are intact.  Expressive speech is intact.  Patient's fund of knowledge is within normal limits for educational level.                          Gross Musculoskeletal Assessment Tremor: None Bulk: Atrophy of deltoid/posterior cuff mm Tone: Normal   Observation No sign of acute infection, mild erythema along R upper arm, mild increased tissue temperature along R middle deltoid     Posture FHRS posture     AROM           AROM (Normal range in degrees) AROM 08/17/2022 AROM 11/17/22 AROM 12/08/22    Right Left Right Left Right Left  Shoulder          Flexion Deferred   125 155 141 155  Extension Deferred        Abduction Deferred   128 165 147 165  External Rotation Deferred   80 WNL 87  WNL  Internal Rotation Deferred   50 WNL 70 WNL  Hands Behind Head Deferred   C7 T3 T2 T4  Hands Behind Back Deferred   L3 T12 L2 T12             Elbow          Flexion WNL        Extension WNL        Pronation 50        Supination 60        (* = pain; Blank rows = not tested)     PROM         PROM (Normal range in degrees) PROM 08/17/2022 PROM 11/17/22 PROM 12/08/22    Right Left Right Left Right Left  Shoulder          Flexion 40    160 157 166  Extension          Abduction     108 WNL 111   External Rotation (arm at side) To neutral/0 deg    WNL ER at 90 deg ABD: 60 deg   Internal Rotation (arm at side) 45    WNL IR at 90 degr ABD: 70 deg         UE MMT:  MMT (out of 5) Right 11/17/22 Left 11/17/22 Right 12/08/22 Left 12/08/22            Shoulder     Flexion 3+  5  4-   Extension        Abduction 4-   4+ 4-   External rotation  4-  4+ 4   Internal rotation  4 5  4+   Horizontal abduction        Horizontal adduction        Lower Trapezius        Rhomboids                 Elbow    Flexion  4+ 5 5   Extension  4+  5 5   Pronation        Supination                 Wrist  Flexion        Extension        Radial deviation        Ulnar deviation        (* = pain; Blank rows = not tested)     Sensation Deferred   Reflexes Deferred     Palpation   Location LEFT  RIGHT           Subocciptials      Cervical paraspinals      Upper Trapezius      Levator Scapulae      Rhomboid Major/Minor      Sternoclavicular joint      Acromioclavicular joint   2  Coracoid process   1  Long head of biceps   1  Supraspinatus   2  Infraspinatus   1  Subscapularis      Teres Minor      Teres Major      Pectoralis Major      Pectoralis Minor      Anterior Deltoid   1  Lateral Deltoid   2  Posterior Deltoid      Latissimus Dorsi      Sternocleidomastoid      (Blank rows = not tested) Graded on 0-4 scale (0 = no pain, 1 = pain, 2 = pain with wincing/grimacing/flinching, 3 = pain with withdrawal, 4 = unwilling to allow palpation), (Blank rows = not tested)       TODAY'S TREATMENT  12/10/22   Manual Therapy - for R shoulder ROM and to prevent R shoulder stiffness; manual perturbations for re-training of stabilizers of GHJ   R shoulder PROM flexion, abduction, ER/IR within pt tolerance with gentle overpressure as able x 15 minutes    *not today* Gentle STM to R anterior and middle deltoid, biceps, upper trapezius; x 2 minutes DTM/Tpr R upper trapezius; x 3 minutes  DTM/TPR R UT; x 3 minutes  -applied Biofreeze to R paracervical musculature and R upper trapezius for analgesic effect    Therapeutic Exercise - for shoulder ROM as needed for reaching and grasping tasks, self-care ADLs, and household  chores  Rhythmic stabilization at 100 deg flexion; x 2 minutes with multidirectional perturbations in static position   Sidelying shoulder abduction; 2x10, ROM as tolerated   Sidelying ER; 2x10, 3-lb Dbell -verbal and tactile cueing for technique  Wall slide, flexion and abduction; x10 ea dir, 5 sec hold  Wand ER in standing, 90 deg abduction with arm propped on railing; 2x10, 3 sec  Serratus slide, at wall on foam roller; 2x10, 5 sec hold at top   Standing scaption with 1-lb Dbell; 2x10   -verbal cueing and demo for upright posture   PATIENT EDUCATION: Discussed expected progression of PT to progress with strengthening, continued work on re-gaining full ROM at home   *not today* Standing Tband row, Blue Tband; 2x12 Finger ladder, x10, 5 sec hold at top for forward flexion  Supine flexion AROM; 2x10 Pulleys; shoulder complex abduction ; x 2 minutes Wand ER AAROM; 2x10 Table slide PROM Flexion x15 Table slide PROM Abd x15 Gripping; pink Theraputty; composite fist, performed x 2 minutes Wrist AROM; flexion/extension, RD/UD, pronation/supination; reviewed for HEP    Cold pack (unbilled) - for anti-inflammatory and analgesic effect as needed for reduced pain and improved ability to participate in active PT intervention, along R shoulder in sitting, pillow propped under R forearm for upper limb support, x 5 minutes    PATIENT EDUCATION:  Education details: see above for patient education details Person educated: Patient Education method: Explanation Education comprehension: verbalized understanding     HOME EXERCISE PROGRAM: Access Code: [email or web address] URL: [email or web address] Date: 12/09/2022 Prepared by: Valentina Gu  Exercises - Supine Shoulder Flexion Extension AAROM with Dowel  - 2 x daily - 7 x weekly - 2 sets - 10 reps - Supine Shoulder External Rotation in 45 Degrees Abduction AAROM with Dowel  - 2 x daily - 7 x weekly - 2 sets - 10 reps - Seated  Shoulder Flexion AAROM with Pulley Behind  - 2 x daily - 7 x weekly - 2 sets - 10 reps - Shoulder Flexion Wall Slide with Towel  - 2 x daily - 7 x weekly - 2 sets - 10 reps - 5sec hold - Isometric Shoulder Flexion at Wall  - 1 x daily - 7 x weekly - 1 sets - 15 reps - 5sec hold - Standing Isometric Shoulder External Rotation with Doorway  - 1 x daily - 7 x weekly - 2 sets - 15 reps - 5sec hold - Standing Isometric Shoulder Internal Rotation with Towel Roll at Doorway  - 1 x daily - 7 x weekly - 2 sets - 15 reps - 5sec hold     ASSESSMENT:   CLINICAL IMPRESSION: Patient has minimal pain today and demonstrates normalizing shoulder PROM in most planes with notable limitation remaining in shoulder complex abduction. Pt needs further work on access to active ROM. Pt is able to increase emphasis on resistance drills today at low intensity without significant pain. Patient has ongoing post-operative impairments in R shoulder AROM and PROM, post-operative pain, decreased strength, R shoulder/arm pain. Patient will benefit from continued skilled PT to address above impairments and improve overall function.   REHAB POTENTIAL: Good   CLINICAL DECISION MAKING: Stable/uncomplicated   EVALUATION COMPLEXITY: Low     GOALS: Goals reviewed with patient? Yes   SHORT TERM GOALS: Target date: 09/14/2022   Pt will be independent with HEP to minimize adverse effects of immobilization and gently mobilize R shoulder to improve pain-free function at home and work. Baseline: 08/17/22: Baseline HEP initiated.   11/17/22:  Pt is compliant with HEP and verbalizes understanding of given exercises Goal status: ACHIEVED     LONG TERM GOALS: Target date: 12/07/22   Pt will increase FOTO to at least 56 to demonstrate significant improvement in function at home and work related to neck pain  Baseline: 08/17/22: 4.   11/17/22: 61/56 Goal status: ACHIEVED   2.  Pt will decrease worst shoulder pain by at least 3 points on  the NPRS in order to demonstrate clinically significant reduction in shoulder pain. Baseline: 08/17/22: 5-6/10 at worst over last week.   09/23/22: 2/10.   11/17/22: 2-3/10 at worst.    12/08/22: 2/10.  Goal status: IN PROGRESS    3.   Pt will have R shoulder AROM at least within 10 degrees of contralateral upper extremity indicative of improved ROM as needed for reaching, overhead work, self-care activities/dressing/grooming Baseline: 08/17/22: NO AROM presently, severely limited PROM in early post-op phase.  09/23/22: AROM not appropriate yet at this point post-operatively   11/17/22: Motion loss remains in all planes of motion.  12/08/22: met for flexion and IR, not met for ER and ABD.  Goal status: IN PROGRESS   4.   Pt will have R shoulder MMTs at 4+/5 or greater for all directions performed indicative of improved strength as needed for  completion of daily lifting, reaching, carrying tasks (e.g. 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  09/23/22: still deferred at this point post-operatively.   11/17/22: MMT for R shoulder 3+ to 4 (see chart above).    12/08/22: Not met for flexion, ABD, ER.  Goal status: ON-GOING   5.   Pt will perform unilateral farmer's carry with 15 lbs for 100 ft without reproduction of pain simulating carrying items in [employer] including carrying trash bag  Baseline: 08/17/22: Unable to perform lifting/carrying.  09/23/22: unable.   11/17/22: Deferred to later date.    12/08/22: Deferred due to remaining weakness.  Goal status: DEFERRED     PLAN: PT FREQUENCY: 2x/week   PT DURATION: 6 weeks   PLANNED INTERVENTIONS: Therapeutic exercises, Therapeutic activity, Neuromuscular re-education, Balance training, Gait training, Patient/Family education,  Joint mobilization, Electrical stimulation, Cryotherapy, Moist heat, and Manual therapy   PLAN FOR NEXT SESSION: Progress to full R shoulder ROM as  tolerated. Continue with R shoulder AA/AROM and gradually progress with RTC isotonics as tolerated. Progress with strengthening as AROM is restored; increased emphasis on strengthening with successive visits.     Valentina Gu, PT, DPT [id number]  Eilleen Kempf, PT 12/10/2022, 1:48 PM

## 2022-12-15 ENCOUNTER — Ambulatory Visit: Payer: Medicare Other | Admitting: Physical Therapy

## 2022-12-15 ENCOUNTER — Encounter: Payer: Self-pay | Admitting: Physical Therapy

## 2022-12-15 DIAGNOSIS — M25611 Stiffness of right shoulder, not elsewhere classified: Secondary | ICD-10-CM

## 2022-12-15 DIAGNOSIS — M6281 Muscle weakness (generalized): Secondary | ICD-10-CM

## 2022-12-15 DIAGNOSIS — M25511 Pain in right shoulder: Secondary | ICD-10-CM | POA: Diagnosis not present

## 2022-12-15 NOTE — Therapy (Unsigned)
OUTPATIENT PHYSICAL THERAPY TREATMENT    Patient Name: Shawn Atkins MRN: [id number] DOB:[birth year], 67 y.o., male Today's Date: 12/15/2022  PCP: Lynnell Jude, MD REFERRING PROVIDER: [name], MD  END OF SESSION:   PT End of Session - 12/15/22 1551     Visit Number 27    Number of Visits 30    Date for PT Re-Evaluation 01/14/23    Authorization Type UHC Medicare, VL based on medical necessity    Authorization Time Period 08/18/23-10/26/22    Progress Note Due on Visit 30    PT Start Time 1341    PT Stop Time 1431    PT Time Calculation (min) 50 min    Activity Tolerance Patient limited by pain;Patient tolerated treatment well    Behavior During Therapy WFL for tasks assessed/performed                 Past Medical History:  Diagnosis Date   BPH (benign prostatic hypertrophy)    Bronchitis    Chronic prostatitis    Complication of anesthesia    Felt like "couldn't breathe" after triple Hernia surgery   Diabetes mellitus without complication (HCC)    Flank pain    GERD (gastroesophageal reflux disease)    h/o   Gross hematuria    History of kidney stones    HTN (hypertension)    pt states he takes lisinopril for kidney protection due to dm not htn   Inguinal hernia    Nocturia    OSA on CPAP    with 02 2 L   Overweight    PONV (postoperative nausea and vomiting)    only during kidney stone surgery   Spermatocele    Stricture of urethra    Vertigo    1 episode, 6-7 yrs ago   Past Surgical History:  Procedure Laterality Date   BICEPT TENODESIS Right 08/06/2022   Procedure: BICEPS TENODESIS;  Surgeon: [name], MD;  Location: [facility];  Service: Orthopedics;  Laterality: Right;   CATARACT EXTRACTION W/PHACO Left 07/22/2020   Procedure: CATARACT EXTRACTION PHACO AND INTRAOCULAR LENS PLACEMENT (I[facility]) LEFT DIABETIC 4.14  00:33.0;  Surgeon: Eulogio Bear, MD;  Location: [place];  Service: Ophthalmology;  Laterality: Left;   Diabetic - oral meds   CATARACT EXTRACTION W/PHACO Right 08/12/2020   Procedure: CATARACT EXTRACTION PHACO AND INTRAOCULAR LENS PLACEMENT ([place]) RIGHT DIABETIC;  Surgeon: Eulogio Bear, MD;  Location: [place];  Service: Ophthalmology;  Laterality: Right;  1.99 0:26.2   COLONOSCOPY WITH PROPOFOL N/A 11/20/2015   Procedure: COLONOSCOPY WITH PROPOFOL;  Surgeon: Christene Lye, MD;  Location: [facility];  Service: Endoscopy;  Laterality: N/A;   HERNIA REPAIR Bilateral [id number]   umbilical and bil inguinal/ Dr Marina Gravel   KIDNEY STONE SURGERY     KNEE ARTHROSCOPY Left    open lithotomy     SHOULDER ARTHROSCOPY WITH OPEN ROTATOR CUFF REPAIR AND DISTAL CLAVICLE ACROMINECTOMY Right 08/06/2022   Procedure: SHOULDER ARTHROSCOPY WITH OPEN ROTATOR CUFF REPAIR AND DISTAL CLAVICLE ACROMINECTOMY;  Surgeon: [name], MD;  Location: [facility];  Service: Orthopedics;  Laterality: Right;   Patient Active Problem List   Diagnosis Date Noted   History of rectal bleeding 06/01/2018   Left groin pain 03/18/2018   Sacroiliac joint pain 01/13/2017   Abdominal pain, left lower quadrant 08/23/2015   BPH with obstruction/lower urinary tract symptoms 08/23/2015   Biceps tendinitis 06/25/2015    REFERRING DIAG: M75.101 Unspecified rotator cuff tear or  rupture of right shoulder, not specified as traumatic  THERAPY DIAG:  Acute pain of right shoulder  Stiffness of right shoulder, not elsewhere classified  Muscle weakness (generalized)  Rationale for Evaluation and Treatment Rehabilitation   PERTINENT HISTORY: Pt is a 67 year old male s/p R shoulder rotator cuff repair and distal clavicle acromionectomy with Dr. Mack Guise on 08/06/22. Patient reports that his surgeon informed him he did have full tear of rotator cuff in surgery. Patient reports 4 days of severe pain just out of surgery. Patient reports managing pain well with Tylenol/Ibuprofen well now. Pt followed up with [facility] for  erythema and increased temperature as well as notable edema; ruled out infection and DVT. Patient reports comorbid neck and R upper trap pain. Pt reports difficulty with sleep positioning due to comorbid neck pain.    Pain:  Pain Intensity: Present: 2-3/10, Best: 2/10, Worst: 5-6/10 Pain location: R shoulder along deltoid and biceps Pain Quality: steady pain, intense intermittently; sharp pain along ACJ that comes and goes Radiating: Yes , to anterior arm Numbness/Tingling: No Aggravating factors: moving his R arm Relieving factors: Ibuprofen, Tylenol, resting arm/arm propped on armrest  History of prior shoulder or neck/shoulder injury, pain, surgery, or therapy: Yes, L shoulder with hx of cortisone injections, Hx of neck injury/PT for his neck Falls: Has patient fallen in last 6 months? No Dominant hand: left Imaging: Yes , MRI pre-operatively Prior level of function: Independent Occupational demands: Pt works part-time with [employer] (20 hrs/week), pt out of work now on medical leave Hobbies: [employer] (personal history of cancer, chills/fever, night sweats, nausea, vomiting, unrelenting pain): Negative   Precautions: None   Weight Bearing Restrictions: No   Living Environment Lives with: lives with their spouse Lives in: House/apartment     Patient Goals: Able to pick up weight with his arm, fish; able to play with baseball/bat with his grandson    PRECAUTIONS: No R shoulder AROM, no RUE lifting   DOS: 08/06/22, s/p R large RTC repair, R distal acromionectomy -8 weeks post-op 10/01/22 -12 weeks post-op 10/29/22   Next f/u with MD on 12/30/22   SUBJECTIVE:                                                                                                                                                                                      SUBJECTIVE STATEMENT:  Patient reports some soreness after PT. He feels that he did okay after last visit and has soreness and pain with  shoulder elevation this weekend. Pt states that he is also having pain in the cervical region and has tried to do some of his neck stretches with limited  effect. Pt is compliant with his HEP.    PAIN:  Are you having pain? No pain reported at arrival Pain location: pain into R paracervical/upper trap region mainly     OBJECTIVE: (objective measures completed at initial evaluation unless otherwise dated)     Patient Surveys  FOTO: 4, predicted outcome score of 56     Cognition Patient is oriented to person, place, and time.  Recent memory is intact.  Remote memory is intact.  Attention span and concentration are intact.  Expressive speech is intact.  Patient's fund of knowledge is within normal limits for educational level.                          Gross Musculoskeletal Assessment Tremor: None Bulk: Atrophy of deltoid/posterior cuff mm Tone: Normal   Observation No sign of acute infection, mild erythema along R upper arm, mild increased tissue temperature along R middle deltoid     Posture FHRS posture     AROM           AROM (Normal range in degrees) AROM 08/17/2022 AROM 11/17/22 AROM 12/08/22    Right Left Right Left Right Left  Shoulder          Flexion Deferred   125 155 141 155  Extension Deferred        Abduction Deferred   128 165 147 165  External Rotation Deferred   80 WNL 87  WNL  Internal Rotation Deferred   50 WNL 70 WNL  Hands Behind Head Deferred   C7 T3 T2 T4  Hands Behind Back Deferred   L3 T12 L2 T12             Elbow          Flexion WNL        Extension WNL        Pronation 50        Supination 60        (* = pain; Blank rows = not tested)     PROM         PROM (Normal range in degrees) PROM 08/17/2022 PROM 11/17/22 PROM 12/08/22    Right Left Right Left Right Left  Shoulder          Flexion 40    160 157 166  Extension          Abduction     108 WNL 111   External Rotation (arm at side) To neutral/0 deg    WNL ER at 90 deg ABD:  60 deg   Internal Rotation (arm at side) 45    WNL IR at 90 degr ABD: 70 deg         UE MMT:  MMT (out of 5) Right 11/17/22 Left 11/17/22 Right 12/08/22 Left 12/08/22           Shoulder     Flexion 3+  5  4-   Extension        Abduction 4-   4+ 4-   External rotation  4-  4+ 4   Internal rotation  4 5  4+   Horizontal abduction        Horizontal adduction        Lower Trapezius        Rhomboids                 Elbow    Flexion  4+ 5 5  Extension  4+  5 5   Pronation        Supination                 Wrist    Flexion        Extension        Radial deviation        Ulnar deviation        (* = pain; Blank rows = not tested)     Sensation Deferred   Reflexes Deferred     Palpation   Location LEFT  RIGHT           Subocciptials      Cervical paraspinals      Upper Trapezius      Levator Scapulae      Rhomboid Major/Minor      Sternoclavicular joint      Acromioclavicular joint   2  Coracoid process   1  Long head of biceps   1  Supraspinatus   2  Infraspinatus   1  Subscapularis      Teres Minor      Teres Major      Pectoralis Major      Pectoralis Minor      Anterior Deltoid   1  Lateral Deltoid   2  Posterior Deltoid      Latissimus Dorsi      Sternocleidomastoid      (Blank rows = not tested) Graded on 0-4 scale (0 = no pain, 1 = pain, 2 = pain with wincing/grimacing/flinching, 3 = pain with withdrawal, 4 = unwilling to allow palpation), (Blank rows = not tested)       TODAY'S TREATMENT  12/15/22   Manual Therapy - for R shoulder ROM and to prevent R shoulder stiffness; manual perturbations for re-training of stabilizers of GHJ   R shoulder PROM flexion, abduction, ER/IR within pt tolerance with gentle overpressure as able x 15 minutes   DTM/Tpr R upper trapezius; x 8 minutes   *not today* Gentle STM to R anterior and middle deltoid, biceps, upper trapezius; x 2 minutes  DTM/TPR R UT; x 3 minutes  -applied Biofreeze to R paracervical  musculature and R upper trapezius for analgesic effect    Therapeutic Exercise - for shoulder ROM as needed for reaching and grasping tasks, self-care ADLs, and household chores  UE ergometer 2 minutes forward and 2 minutes backwards for muscle extensibility  - subjective obtained during this time  Rhythmic stabilization at 100 deg flexion; 2x1 minutes with multidirectional perturbations in static position   Sidelying shoulder abduction; 2x10, ROM as tolerated   Wall slide, flexion and abduction; x10 ea dir, 5 sec hold  Serratus slide, at wall on foam roller; 1x10, 5 sec hold at top   Standing scaption with 1-lb Dbell; 1x10   -verbal cueing and demo for upright posture    Standing ER step-outs for isometric contraction with red Tband; 1x8   - tactile cueing and demo for maintaining arm in neutral position    Standing IR step-outs for isometric contraction with red Tband; 1x8   - tactile cueing and demo for maintaining arm in neutral position   PATIENT EDUCATION: Discussed expected progression of PT to progress with strengthening, continued work on re-gaining full ROM at home   *not today* Wand ER in standing, 90 deg abduction with arm propped on railing; 2x10, 3 sec Sidelying ER; 2x10, 3-lb Dbell -verbal and tactile cueing for technique Standing Tband row,  Blue Tband; 2x12 Finger ladder, x10, 5 sec hold at top for forward flexion  Supine flexion AROM; 2x10 Pulleys; shoulder complex abduction ; x 2 minutes Wand ER AAROM; 2x10 Table slide PROM Flexion x15 Table slide PROM Abd x15 Gripping; pink Theraputty; composite fist, performed x 2 minutes Wrist AROM; flexion/extension, RD/UD, pronation/supination; reviewed for HEP  Cold pack (unbilled) - for anti-inflammatory and analgesic effect as needed for reduced pain and improved ability to participate in active PT intervention, along R shoulder in sitting, pillow propped under R forearm for upper limb support, x 5  minutes     PATIENT EDUCATION:  Education details: see above for patient education details Person educated: Patient Education method: Explanation Education comprehension: verbalized understanding     HOME EXERCISE PROGRAM: Access CodeHC:[email or web address] URL: [email or web address] Date: 12/09/2022 Prepared by: Valentina Gu  Exercises - Supine Shoulder Flexion Extension AAROM with Dowel  - 2 x daily - 7 x weekly - 2 sets - 10 reps - Supine Shoulder External Rotation in 45 Degrees Abduction AAROM with Dowel  - 2 x daily - 7 x weekly - 2 sets - 10 reps - Seated Shoulder Flexion AAROM with Pulley Behind  - 2 x daily - 7 x weekly - 2 sets - 10 reps - Shoulder Flexion Wall Slide with Towel  - 2 x daily - 7 x weekly - 2 sets - 10 reps - 5sec hold - Isometric Shoulder Flexion at Wall  - 1 x daily - 7 x weekly - 1 sets - 15 reps - 5sec hold - Standing Isometric Shoulder External Rotation with Doorway  - 1 x daily - 7 x weekly - 2 sets - 15 reps - 5sec hold - Standing Isometric Shoulder Internal Rotation with Towel Roll at Doorway  - 1 x daily - 7 x weekly - 2 sets - 15 reps - 5sec hold     ASSESSMENT:   CLINICAL IMPRESSION: Patient tolerates well with standing ER/IR with resistance band and demonstrated good ability to maintain form with minimal cueing for isometric contraction. Pt has a moderate level of pain today and presents with notable limitation remaining in shoulder complex abduction. Pt needs further work on access to active ROM. Pt is able to increase emphasis on resistance drills today at low intensity without significant pain. Pt was limited today by cervical pain. Patient has ongoing post-operative impairments in R shoulder AROM and PROM, post-operative pain, decreased strength, R shoulder/arm pain. Patient will benefit from continued skilled PT to address above impairments and improve overall function.   REHAB POTENTIAL: Good   CLINICAL DECISION MAKING:  Stable/uncomplicated   EVALUATION COMPLEXITY: Low     GOALS: Goals reviewed with patient? Yes   SHORT TERM GOALS: Target date: 09/14/2022   Pt will be independent with HEP to minimize adverse effects of immobilization and gently mobilize R shoulder to improve pain-free function at home and work. Baseline: 08/17/22: Baseline HEP initiated.   11/17/22:  Pt is compliant with HEP and verbalizes understanding of given exercises Goal status: ACHIEVED     LONG TERM GOALS: Target date: 12/07/22   Pt will increase FOTO to at least 56 to demonstrate significant improvement in function at home and work related to neck pain  Baseline: 08/17/22: 4.   11/17/22: 61/56 Goal status: ACHIEVED   2.  Pt will decrease worst shoulder pain by at least 3 points on the NPRS in order to demonstrate clinically significant reduction in shoulder pain. Baseline: 08/17/22: 5-6/10 at worst over  last week.   09/23/22: 2/10.   11/17/22: 2-3/10 at worst.    12/08/22: 2/10.  Goal status: IN PROGRESS    3.   Pt will have R shoulder AROM at least within 10 degrees of contralateral upper extremity indicative of improved ROM as needed for reaching, overhead work, self-care activities/dressing/grooming Baseline: 08/17/22: NO AROM presently, severely limited PROM in early post-op phase.  09/23/22: AROM not appropriate yet at this point post-operatively   11/17/22: Motion loss remains in all planes of motion.  12/08/22: met for flexion and IR, not met for ER and ABD.  Goal status: IN PROGRESS   4.   Pt will have R shoulder MMTs at 4+/5 or greater for all directions performed indicative of improved strength as needed for completion of daily lifting, reaching, carrying tasks (e.g. 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  09/23/22: still deferred at this point post-operatively.   11/17/22: MMT for R shoulder 3+ to 4 (see chart above).    12/08/22: Not met for  flexion, ABD, ER.  Goal status: ON-GOING   5.   Pt will perform unilateral farmer's carry with 15 lbs for 100 ft without reproduction of pain simulating carrying items in [employer] including carrying trash bag  Baseline: 08/17/22: Unable to perform lifting/carrying.  09/23/22: unable.   11/17/22: Deferred to later date.    12/08/22: Deferred due to remaining weakness.  Goal status: DEFERRED     PLAN: PT FREQUENCY: 2x/week   PT DURATION: 6 weeks   PLANNED INTERVENTIONS: Therapeutic exercises, Therapeutic activity, Neuromuscular re-education, Balance training, Gait training, Patient/Family education,  Joint mobilization, Electrical stimulation, Cryotherapy, Moist heat, and Manual therapy   PLAN FOR NEXT SESSION: Progress to full R shoulder ROM as tolerated. Continue with R shoulder AA/AROM and gradually progress with RTC isotonics as tolerated. Progress with strengthening as AROM is restored; increased emphasis on strengthening with successive visits.     Ramey Schiff Debby Freiberg, SPT  Eilleen Kempf, PT 12/16/2022, 8:22 AM

## 2022-12-17 ENCOUNTER — Encounter: Payer: Self-pay | Admitting: Physical Therapy

## 2022-12-17 ENCOUNTER — Ambulatory Visit: Payer: Medicare Other | Admitting: Physical Therapy

## 2022-12-17 DIAGNOSIS — M6281 Muscle weakness (generalized): Secondary | ICD-10-CM

## 2022-12-17 DIAGNOSIS — M25511 Pain in right shoulder: Secondary | ICD-10-CM | POA: Diagnosis not present

## 2022-12-17 DIAGNOSIS — M25611 Stiffness of right shoulder, not elsewhere classified: Secondary | ICD-10-CM

## 2022-12-17 NOTE — Therapy (Signed)
OUTPATIENT PHYSICAL THERAPY TREATMENT    Patient Name: Shawn Atkins MRN: [id number] DOB:1955/12/16, 67 y.o., male Today's Date: 12/17/2022  PCP: Lynnell Jude, MD REFERRING PROVIDER: [name], MD  END OF SESSION:   PT End of Session - 12/19/22 1914     Visit Number 28    Number of Visits 30    Date for PT Re-Evaluation 01/14/23    Authorization Type UHC Medicare, VL based on medical necessity    Authorization Time Period 08/18/23-10/26/22    Progress Note Due on Visit 30    PT Start Time 1346    PT Stop Time 1431    PT Time Calculation (min) 45 min    Activity Tolerance Patient limited by pain;Patient tolerated treatment well    Behavior During Therapy WFL for tasks assessed/performed                  Past Medical History:  Diagnosis Date   BPH (benign prostatic hypertrophy)    Bronchitis    Chronic prostatitis    Complication of anesthesia    Felt like "couldn't breathe" after triple Hernia surgery   Diabetes mellitus without complication (HCC)    Flank pain    GERD (gastroesophageal reflux disease)    h/o   Gross hematuria    History of kidney stones    HTN (hypertension)    pt states he takes lisinopril for kidney protection due to dm not htn   Inguinal hernia    Nocturia    OSA on CPAP    with 02 2 L   Overweight    PONV (postoperative nausea and vomiting)    only during kidney stone surgery   Spermatocele    Stricture of urethra    Vertigo    1 episode, 6-7 yrs ago   Past Surgical History:  Procedure Laterality Date   BICEPT TENODESIS Right 08/06/2022   Procedure: BICEPS TENODESIS;  Surgeon: [name], MD;  Location: [facility];  Service: Orthopedics;  Laterality: Right;   CATARACT EXTRACTION W/PHACO Left 07/22/2020   Procedure: CATARACT EXTRACTION PHACO AND INTRAOCULAR LENS PLACEMENT ([facility]) LEFT DIABETIC 4.14  00:33.0;  Surgeon: Eulogio Bear, MD;  Location: [place];  Service: Ophthalmology;  Laterality: Left;   Diabetic - oral meds   CATARACT EXTRACTION W/PHACO Right 08/12/2020   Procedure: CATARACT EXTRACTION PHACO AND INTRAOCULAR LENS PLACEMENT ([place]) RIGHT DIABETIC;  Surgeon: Eulogio Bear, MD;  Location: [place];  Service: Ophthalmology;  Laterality: Right;  1.99 0:26.2   COLONOSCOPY WITH PROPOFOL N/A 11/20/2015   Procedure: COLONOSCOPY WITH PROPOFOL;  Surgeon: Christene Lye, MD;  Location: [facility];  Service: Endoscopy;  Laterality: N/A;   HERNIA REPAIR Bilateral [id number]   umbilical and bil inguinal/ Dr Marina Gravel   KIDNEY STONE SURGERY     KNEE ARTHROSCOPY Left    open lithotomy     SHOULDER ARTHROSCOPY WITH OPEN ROTATOR CUFF REPAIR AND DISTAL CLAVICLE ACROMINECTOMY Right 08/06/2022   Procedure: SHOULDER ARTHROSCOPY WITH OPEN ROTATOR CUFF REPAIR AND DISTAL CLAVICLE ACROMINECTOMY;  Surgeon: [name], MD;  Location: [facility];  Service: Orthopedics;  Laterality: Right;   Patient Active Problem List   Diagnosis Date Noted   History of rectal bleeding 06/01/2018   Left groin pain 03/18/2018   Sacroiliac joint pain 01/13/2017   Abdominal pain, left lower quadrant 08/23/2015   BPH with obstruction/lower urinary tract symptoms 08/23/2015   Biceps tendinitis 06/25/2015    REFERRING DIAG: M75.101 Unspecified rotator cuff tear  or rupture of right shoulder, not specified as traumatic  THERAPY DIAG:  Acute pain of right shoulder  Stiffness of right shoulder, not elsewhere classified  Muscle weakness (generalized)  Rationale for Evaluation and Treatment Rehabilitation   PERTINENT HISTORY: Pt is a 67 year old male s/p R shoulder rotator cuff repair and distal clavicle acromionectomy with Dr. Mack Guise on 08/06/22. Patient reports that his surgeon informed him he did have full tear of rotator cuff in surgery. Patient reports 4 days of severe pain just out of surgery. Patient reports managing pain well with Tylenol/Ibuprofen well now. Pt followed up with [facility] for  erythema and increased temperature as well as notable edema; ruled out infection and DVT. Patient reports comorbid neck and R upper trap pain. Pt reports difficulty with sleep positioning due to comorbid neck pain.    Pain:  Pain Intensity: Present: 2-3/10, Best: 2/10, Worst: 5-6/10 Pain location: R shoulder along deltoid and biceps Pain Quality: steady pain, intense intermittently; sharp pain along ACJ that comes and goes Radiating: Yes , to anterior arm Numbness/Tingling: No Aggravating factors: moving his R arm Relieving factors: Ibuprofen, Tylenol, resting arm/arm propped on armrest  History of prior shoulder or neck/shoulder injury, pain, surgery, or therapy: Yes, L shoulder with hx of cortisone injections, Hx of neck injury/PT for his neck Falls: Has patient fallen in last 6 months? No Dominant hand: left Imaging: Yes , MRI pre-operatively Prior level of function: Independent Occupational demands: Pt works part-time with [employer] (20 hrs/week), pt out of work now on medical leave Hobbies: [employer] (personal history of cancer, chills/fever, night sweats, nausea, vomiting, unrelenting pain): Negative   Precautions: None   Weight Bearing Restrictions: No   Living Environment Lives with: lives with their spouse Lives in: House/apartment     Patient Goals: Able to pick up weight with his arm, fish; able to play with baseball/bat with his grandson    PRECAUTIONS: No R shoulder AROM, no RUE lifting   DOS: 08/06/22, s/p R large RTC repair, R distal acromionectomy -8 weeks post-op 10/01/22 -12 weeks post-op 10/29/22   Next f/u with MD on 12/30/22   SUBJECTIVE:                                                                                                                                                                                      SUBJECTIVE STATEMENT:  Pt reports some soreness today from last visit with PT. Pt states that he was still able to accomplish daily tasks  and continuing HEP.   PAIN:  Are you having pain? No pain reported at arrival Pain location: pain into R paracervical/upper trap region mainly  OBJECTIVE: (objective measures completed at initial evaluation unless otherwise dated)     Patient Surveys  FOTO: 4, predicted outcome score of 18     Cognition Patient is oriented to person, place, and time.  Recent memory is intact.  Remote memory is intact.  Attention span and concentration are intact.  Expressive speech is intact.  Patient's fund of knowledge is within normal limits for educational level.                          Gross Musculoskeletal Assessment Tremor: None Bulk: Atrophy of deltoid/posterior cuff mm Tone: Normal   Observation No sign of acute infection, mild erythema along R upper arm, mild increased tissue temperature along R middle deltoid     Posture FHRS posture     AROM           AROM (Normal range in degrees) AROM 08/17/2022 AROM 11/17/22 AROM 12/08/22    Right Left Right Left Right Left  Shoulder          Flexion Deferred   125 155 141 155  Extension Deferred        Abduction Deferred   128 165 147 165  External Rotation Deferred   80 WNL 87  WNL  Internal Rotation Deferred   50 WNL 70 WNL  Hands Behind Head Deferred   C7 T3 T2 T4  Hands Behind Back Deferred   L3 T12 L2 T12             Elbow          Flexion WNL        Extension WNL        Pronation 50        Supination 60        (* = pain; Blank rows = not tested)     PROM         PROM (Normal range in degrees) PROM 08/17/2022 PROM 11/17/22 PROM 12/08/22    Right Left Right Left Right Left  Shoulder          Flexion 40    160 157 166  Extension          Abduction     108 WNL 111   External Rotation (arm at side) To neutral/0 deg    WNL ER at 90 deg ABD: 60 deg   Internal Rotation (arm at side) 45    WNL IR at 90 degr ABD: 70 deg         UE MMT:  MMT (out of 5) Right 11/17/22 Left 11/17/22 Right 12/08/22  Left 12/08/22           Shoulder     Flexion 3+  5  4-   Extension        Abduction 4-   4+ 4-   External rotation  4-  4+ 4   Internal rotation  4 5  4+   Horizontal abduction        Horizontal adduction        Lower Trapezius        Rhomboids                 Elbow    Flexion  4+ 5 5   Extension  4+  5 5   Pronation        Supination                 Wrist  Flexion        Extension        Radial deviation        Ulnar deviation        (* = pain; Blank rows = not tested)     Sensation Deferred   Reflexes Deferred     Palpation   Location LEFT  RIGHT           Subocciptials      Cervical paraspinals      Upper Trapezius      Levator Scapulae      Rhomboid Major/Minor      Sternoclavicular joint      Acromioclavicular joint   2  Coracoid process   1  Long head of biceps   1  Supraspinatus   2  Infraspinatus   1  Subscapularis      Teres Minor      Teres Major      Pectoralis Major      Pectoralis Minor      Anterior Deltoid   1  Lateral Deltoid   2  Posterior Deltoid      Latissimus Dorsi      Sternocleidomastoid      (Blank rows = not tested) Graded on 0-4 scale (0 = no pain, 1 = pain, 2 = pain with wincing/grimacing/flinching, 3 = pain with withdrawal, 4 = unwilling to allow palpation), (Blank rows = not tested)       TODAY'S TREATMENT  12/15/22   Manual Therapy - for R shoulder ROM and to prevent R shoulder stiffness; manual perturbations for re-training of stabilizers of GHJ   R shoulder PROM flexion, abduction, ER/IR within pt tolerance with gentle overpressure as able x 15 minutes    *not today* DTM/Tpr R upper trapezius; x 8 minutes Gentle STM to R anterior and middle deltoid, biceps, upper trapezius; x 2 minutes  DTM/TPR R UT; x 3 minutes  -applied Biofreeze to R paracervical musculature and R upper trapezius for analgesic effect    Therapeutic Exercise - for shoulder ROM as needed for reaching and grasping tasks, self-care ADLs,  and household chores  UE ergometer 2 minutes forward and 2 minutes backwards for muscle extensibility  - subjective obtained during this time  Rhythmic stabilization at 100 deg flexion; 2x1 minutes with multidirectional perturbations in static position   Sidelying shoulder abduction; 1x10, ROM as tolerated   Serratus slide, at wall on foam roller; 2x10, 5 sec hold at top   Standing scaption with 1-lb Dbell; 1x10   -verbal cueing and demo for upright posture    Standing ER step-outs for isometric contraction with red Tband; 2x8 (3 steps outs with tension)   - tactile cueing and demo for maintaining arm in neutral position    Standing IR step-outs for isometric contraction with red Tband; 2x8 (3 lateral steps with tension)   - tactile cueing and demo for maintaining arm in neutral position   PATIENT EDUCATION: Discussed expected progression of PT to progress with strengthening, continued work on re-gaining full ROM at home   *not today* Wall slide, flexion and abduction; x10 ea dir, 5 sec hold Wand ER in standing, 90 deg abduction with arm propped on railing; 2x10, 3 sec Sidelying ER; 2x10, 3-lb Dbell -verbal and tactile cueing for technique Standing Tband row, Blue Tband; 2x12 Finger ladder, x10, 5 sec hold at top for forward flexion  Supine flexion AROM; 2x10 Pulleys; shoulder complex abduction ; x 2 minutes Wand ER  AAROM; 2x10 Table slide PROM Flexion x15 Table slide PROM Abd x15 Gripping; pink Theraputty; composite fist, performed x 2 minutes Wrist AROM; flexion/extension, RD/UD, pronation/supination; reviewed for HEP  Cold pack (unbilled) - for anti-inflammatory and analgesic effect as needed for reduced pain and improved ability to participate in active PT intervention, along R shoulder in sitting, pillow propped under R forearm for upper limb support, x 5 minutes     PATIENT EDUCATION:  Education details: see above for patient education details Person educated:  Patient Education method: Explanation Education comprehension: verbalized understanding     HOME EXERCISE PROGRAM: Access CodeHC:[email or web address] URL: [email or web address] Date: 12/09/2022 Prepared by: Valentina Gu  Exercises - Supine Shoulder Flexion Extension AAROM with Dowel  - 2 x daily - 7 x weekly - 2 sets - 10 reps - Supine Shoulder External Rotation in 45 Degrees Abduction AAROM with Dowel  - 2 x daily - 7 x weekly - 2 sets - 10 reps - Seated Shoulder Flexion AAROM with Pulley Behind  - 2 x daily - 7 x weekly - 2 sets - 10 reps - Shoulder Flexion Wall Slide with Towel  - 2 x daily - 7 x weekly - 2 sets - 10 reps - 5sec hold - Isometric Shoulder Flexion at Wall  - 1 x daily - 7 x weekly - 1 sets - 15 reps - 5sec hold - Standing Isometric Shoulder External Rotation with Doorway  - 1 x daily - 7 x weekly - 2 sets - 15 reps - 5sec hold - Standing Isometric Shoulder Internal Rotation with Towel Roll at Doorway  - 1 x daily - 7 x weekly - 2 sets - 15 reps - 5sec hold     ASSESSMENT:   CLINICAL IMPRESSION: Patient demonstrated progression to symmetrical AROM with shoulder complex flexion and GHJ IR. Pt tolerates increased repetitions with standing ER/IR with resistance band well and demonstrated good ability to maintain form with minimal cueing for isometric hold position. Pt has decreased pain today and has better tolerance to activities today. Pt presents with notable limitation remaining in shoulder complex abduction ROM. Pt needs further work on access to active ROM. Pt is able to increase emphasis on resistance drills today at low intensity without significant pain. Pt was limited today by cervical pain. Patient has ongoing post-operative impairments in R shoulder AROM and PROM, post-operative pain, decreased strength, R shoulder/arm pain. Patient will benefit from continued skilled PT to address above impairments and improve overall function.   REHAB POTENTIAL: Good    CLINICAL DECISION MAKING: Stable/uncomplicated   EVALUATION COMPLEXITY: Low     GOALS: Goals reviewed with patient? Yes   SHORT TERM GOALS: Target date: 09/14/2022   Pt will be independent with HEP to minimize adverse effects of immobilization and gently mobilize R shoulder to improve pain-free function at home and work. Baseline: 08/17/22: Baseline HEP initiated.   11/17/22:  Pt is compliant with HEP and verbalizes understanding of given exercises Goal status: ACHIEVED     LONG TERM GOALS: Target date: 12/07/22   Pt will increase FOTO to at least 56 to demonstrate significant improvement in function at home and work related to neck pain  Baseline: 08/17/22: 4.   11/17/22: 61/56 Goal status: ACHIEVED   2.  Pt will decrease worst shoulder pain by at least 3 points on the NPRS in order to demonstrate clinically significant reduction in shoulder pain. Baseline: 08/17/22: 5-6/10 at worst over last week.   09/23/22: 2/10.  11/17/22: 2-3/10 at worst.    12/08/22: 2/10.  Goal status: IN PROGRESS    3.   Pt will have R shoulder AROM at least within 10 degrees of contralateral upper extremity indicative of improved ROM as needed for reaching, overhead work, self-care activities/dressing/grooming Baseline: 08/17/22: NO AROM presently, severely limited PROM in early post-op phase.  09/23/22: AROM not appropriate yet at this point post-operatively   11/17/22: Motion loss remains in all planes of motion.  12/08/22: met for flexion and IR, not met for ER and ABD.  Goal status: IN PROGRESS   4.   Pt will have R shoulder MMTs at 4+/5 or greater for all directions performed indicative of improved strength as needed for completion of daily lifting, reaching, carrying tasks (e.g. 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  09/23/22: still deferred at this point post-operatively.   11/17/22: MMT for R shoulder 3+ to 4 (see chart  above).    12/08/22: Not met for flexion, ABD, ER.  Goal status: ON-GOING   5.   Pt will perform unilateral farmer's carry with 15 lbs for 100 ft without reproduction of pain simulating carrying items in [employer] including carrying trash bag  Baseline: 08/17/22: Unable to perform lifting/carrying.  09/23/22: unable.   11/17/22: Deferred to later date.    12/08/22: Deferred due to remaining weakness.  Goal status: DEFERRED     PLAN: PT FREQUENCY: 2x/week   PT DURATION: 6 weeks   PLANNED INTERVENTIONS: Therapeutic exercises, Therapeutic activity, Neuromuscular re-education, Balance training, Gait training, Patient/Family education,  Joint mobilization, Electrical stimulation, Cryotherapy, Moist heat, and Manual therapy   PLAN FOR NEXT SESSION: Progress to full R shoulder ROM as tolerated. Continue with R shoulder AA/AROM and gradually progress with RTC isotonics as tolerated. Progress with strengthening as AROM is restored; increased emphasis on strengthening with successive visits.     Akela Pocius Debby Freiberg, SPT  Eilleen Kempf, PT 12/19/2022, 7:14 PM

## 2022-12-22 ENCOUNTER — Ambulatory Visit: Payer: Medicare Other | Admitting: Physical Therapy

## 2022-12-22 ENCOUNTER — Encounter: Payer: Self-pay | Admitting: Physical Therapy

## 2022-12-22 DIAGNOSIS — M25511 Pain in right shoulder: Secondary | ICD-10-CM

## 2022-12-22 DIAGNOSIS — M6281 Muscle weakness (generalized): Secondary | ICD-10-CM

## 2022-12-22 DIAGNOSIS — M25611 Stiffness of right shoulder, not elsewhere classified: Secondary | ICD-10-CM

## 2022-12-22 NOTE — Therapy (Unsigned)
OUTPATIENT PHYSICAL THERAPY TREATMENT    Patient Name: Shawn Atkins MRN: [id number] DOB:1956/09/22, 67 y.o., male Today's Date: 12/22/2022  PCP: Lynnell Jude, MD REFERRING PROVIDER: [name], MD  END OF SESSION:   PT End of Session - 12/22/22 1752     Visit Number 29    Number of Visits 30    Date for PT Re-Evaluation 01/14/23    Authorization Type U[employer] Medicare, VL based on medical necessity    Authorization Time Period 08/18/23-10/26/22    Progress Note Due on Visit 30    PT Start Time 1345    PT Stop Time 1431    PT Time Calculation (min) 46 min    Activity Tolerance Patient limited by pain;Patient tolerated treatment well    Behavior During Therapy WFL for tasks assessed/performed                   Past Medical History:  Diagnosis Date   BPH (benign prostatic hypertrophy)    Bronchitis    Chronic prostatitis    Complication of anesthesia    Felt like "couldn't breathe" after triple Hernia surgery   Diabetes mellitus without complication (HCC)    Flank pain    GERD (gastroesophageal reflux disease)    h/o   Gross hematuria    History of kidney stones    HTN (hypertension)    pt states he takes lisinopril for kidney protection due to dm not htn   Inguinal hernia    Nocturia    OSA on CPAP    with 02 2 L   Overweight    PONV (postoperative nausea and vomiting)    only during kidney stone surgery   Spermatocele    Stricture of urethra    Vertigo    1 episode, 6-7 yrs ago   Past Surgical History:  Procedure Laterality Date   BICEPT TENODESIS Right 08/06/2022   Procedure: BICEPS TENODESIS;  Surgeon: [name], MD;  Location: [facility];  Service: Orthopedics;  Laterality: Right;   CATARACT EXTRACTION W/PHACO Left 07/22/2020   Procedure: CATARACT EXTRACTION PHACO AND INTRAOCULAR LENS PLACEMENT ([facility]) LEFT DIABETIC 4.14  00:33.0;  Surgeon: Eulogio Bear, MD;  Location: [place];  Service: Ophthalmology;  Laterality: Left;   Diabetic - oral meds   CATARACT EXTRACTION W/PHACO Right 08/12/2020   Procedure: CATARACT EXTRACTION PHACO AND INTRAOCULAR LENS PLACEMENT ([place]) RIGHT DIABETIC;  Surgeon: Eulogio Bear, MD;  Location: [place];  Service: Ophthalmology;  Laterality: Right;  1.99 0:26.2   COLONOSCOPY WITH PROPOFOL N/A 11/20/2015   Procedure: COLONOSCOPY WITH PROPOFOL;  Surgeon: Christene Lye, MD;  Location: [facility];  Service: Endoscopy;  Laterality: N/A;   HERNIA REPAIR Bilateral [id number]   umbilical and bil inguinal/ Dr Marina Gravel   KIDNEY STONE SURGERY     KNEE ARTHROSCOPY Left    open lithotomy     SHOULDER ARTHROSCOPY WITH OPEN ROTATOR CUFF REPAIR AND DISTAL CLAVICLE ACROMINECTOMY Right 08/06/2022   Procedure: SHOULDER ARTHROSCOPY WITH OPEN ROTATOR CUFF REPAIR AND DISTAL CLAVICLE ACROMINECTOMY;  Surgeon: [name], MD;  Location: [facility];  Service: Orthopedics;  Laterality: Right;   Patient Active Problem List   Diagnosis Date Noted   History of rectal bleeding 06/01/2018   Left groin pain 03/18/2018   Sacroiliac joint pain 01/13/2017   Abdominal pain, left lower quadrant 08/23/2015   BPH with obstruction/lower urinary tract symptoms 08/23/2015   Biceps tendinitis 06/25/2015    REFERRING DIAG: M75.101 Unspecified rotator cuff  tear or rupture of right shoulder, not specified as traumatic  THERAPY DIAG:  No diagnosis found.  Rationale for Evaluation and Treatment Rehabilitation   PERTINENT HISTORY: Pt is a 67 year old male s/p R shoulder rotator cuff repair and distal clavicle acromionectomy with Dr. Mack Guise on 08/06/22. Patient reports that his surgeon informed him he did have full tear of rotator cuff in surgery. Patient reports 4 days of severe pain just out of surgery. Patient reports managing pain well with Tylenol/Ibuprofen well now. Pt followed up with [facility] for erythema and increased temperature as well as notable edema; ruled out infection and DVT. Patient  reports comorbid neck and R upper trap pain. Pt reports difficulty with sleep positioning due to comorbid neck pain.    Pain:  Pain Intensity: Present: 2-3/10, Best: 2/10, Worst: 5-6/10 Pain location: R shoulder along deltoid and biceps Pain Quality: steady pain, intense intermittently; sharp pain along ACJ that comes and goes Radiating: Yes , to anterior arm Numbness/Tingling: No Aggravating factors: moving his R arm Relieving factors: Ibuprofen, Tylenol, resting arm/arm propped on armrest  History of prior shoulder or neck/shoulder injury, pain, surgery, or therapy: Yes, L shoulder with hx of cortisone injections, Hx of neck injury/PT for his neck Falls: Has patient fallen in last 6 months? No Dominant hand: left Imaging: Yes , MRI pre-operatively Prior level of function: Independent Occupational demands: Pt works part-time with [employer] (20 hrs/week), pt out of work now on medical leave Hobbies: [employer] (personal history of cancer, chills/fever, night sweats, nausea, vomiting, unrelenting pain): Negative   Precautions: None   Weight Bearing Restrictions: No   Living Environment Lives with: lives with their spouse Lives in: House/apartment     Patient Goals: Able to pick up weight with his arm, fish; able to play with baseball/bat with his grandson    PRECAUTIONS: No R shoulder AROM, no RUE lifting   DOS: 08/06/22, s/p R large RTC repair, R distal acromionectomy -8 weeks post-op 10/01/22 -12 weeks post-op 10/29/22   Next f/u with MD on 12/30/22   SUBJECTIVE:                                                                                                                                                                                      SUBJECTIVE STATEMENT:  Pt reports some discomfort with reaching overhead near the end-range of motion. Pt states that pain is nothing more than usual and has had no major changes since last visit.    PAIN:  Are you having pain? No  pain reported at arrival Pain location: pain into R paracervical/upper trap region mainly     OBJECTIVE: (objective measures completed  at initial evaluation unless otherwise dated)     Patient Surveys  FOTO: 4, predicted outcome score of 31     Cognition Patient is oriented to person, place, and time.  Recent memory is intact.  Remote memory is intact.  Attention span and concentration are intact.  Expressive speech is intact.  Patient's fund of knowledge is within normal limits for educational level.                          Gross Musculoskeletal Assessment Tremor: None Bulk: Atrophy of deltoid/posterior cuff mm Tone: Normal   Observation No sign of acute infection, mild erythema along R upper arm, mild increased tissue temperature along R middle deltoid     Posture FHRS posture     AROM           AROM (Normal range in degrees) AROM 08/17/2022 AROM 11/17/22 AROM 12/08/22    Right Left Right Left Right Left  Shoulder          Flexion Deferred   125 155 141 155  Extension Deferred        Abduction Deferred   128 165 147 165  External Rotation Deferred   80 WNL 87  WNL  Internal Rotation Deferred   50 WNL 70 WNL  Hands Behind Head Deferred   C7 T3 T2 T4  Hands Behind Back Deferred   L3 T12 L2 T12             Elbow          Flexion WNL        Extension WNL        Pronation 50        Supination 60        (* = pain; Blank rows = not tested)     PROM         PROM (Normal range in degrees) PROM 08/17/2022 PROM 11/17/22 PROM 12/08/22    Right Left Right Left Right Left  Shoulder          Flexion 40    160 157 166  Extension          Abduction     108 WNL 111   External Rotation (arm at side) To neutral/0 deg    WNL ER at 90 deg ABD: 60 deg   Internal Rotation (arm at side) 45    WNL IR at 90 degr ABD: 70 deg         UE MMT:  MMT (out of 5) Right 11/17/22 Left 11/17/22 Right 12/08/22 Left 12/08/22           Shoulder     Flexion 3+  5  4-    Extension        Abduction 4-   4+ 4-   External rotation  4-  4+ 4   Internal rotation  4 5  4+   Horizontal abduction        Horizontal adduction        Lower Trapezius        Rhomboids                 Elbow    Flexion  4+ 5 5   Extension  4+  5 5   Pronation        Supination                 Wrist    Flexion  Extension        Radial deviation        Ulnar deviation        (* = pain; Blank rows = not tested)     Sensation Deferred   Reflexes Deferred     Palpation   Location LEFT  RIGHT           Subocciptials      Cervical paraspinals      Upper Trapezius      Levator Scapulae      Rhomboid Major/Minor      Sternoclavicular joint      Acromioclavicular joint   2  Coracoid process   1  Long head of biceps   1  Supraspinatus   2  Infraspinatus   1  Subscapularis      Teres Minor      Teres Major      Pectoralis Major      Pectoralis Minor      Anterior Deltoid   1  Lateral Deltoid   2  Posterior Deltoid      Latissimus Dorsi      Sternocleidomastoid      (Blank rows = not tested) Graded on 0-4 scale (0 = no pain, 1 = pain, 2 = pain with wincing/grimacing/flinching, 3 = pain with withdrawal, 4 = unwilling to allow palpation), (Blank rows = not tested)       TODAY'S TREATMENT 12/22/22   Manual Therapy - for R shoulder ROM and to prevent R shoulder stiffness; manual perturbations for re-training of stabilizers of GHJ   R shoulder PROM flexion, abduction, ER/IR within pt tolerance with gentle overpressure as able x 15 minutes   DTM/Tpr R upper trapezius; x 8 minutes  *not today* Gentle STM to R anterior and middle deltoid, biceps, upper trapezius; x 2 minutes  DTM/TPR R UT; x 3 minutes  -applied Biofreeze to R paracervical musculature and R upper trapezius for analgesic effect    Therapeutic Exercise - for shoulder ROM as needed for reaching and grasping tasks, self-care ADLs, and household chores  UE ergometer 2 minutes forward and 2  minutes backwards for muscle extensibility  - subjective obtained during this time  Rhythmic stabilization at 100 deg flexion; 2x1 minutes with multidirectional perturbations in static position   Sidelying shoulder abduction; 1x10, ROM as tolerated   Wall slide, flexion; x10 with 5 sec hold  Wand ER in standing, 90 deg abduction with arm propped on railing; 1x10, 3 sec   Standing ER step-outs for isometric contraction with red Tband; 1x8 (3 steps outs with tension)   - tactile cueing and demo for maintaining arm in neutral position    Standing IR step-outs for isometric contraction with red Tband; 1x8 (3 lateral steps with tension)   - tactile cueing and demo for maintaining arm in neutral position   PATIENT EDUCATION: Discussed expected progression of PT to progress with strengthening, continued work on re-gaining full ROM at home    *not today* Serratus slide, at wall on foam roller; 2x10, 5 sec hold at top Standing scaption with 1-lb Dbell; 1x10   -verbal cueing and demo for upright posture  Wall slide, flexion and abduction; x10 ea dir, 5 sec hold Sidelying ER; 2x10, 3-lb Dbell -verbal and tactile cueing for technique Standing Tband row, Blue Tband; 2x12 Finger ladder, x10, 5 sec hold at top for forward flexion  Supine flexion AROM; 2x10 Pulleys; shoulder complex abduction ; x 2 minutes Wand ER AAROM;  2x10 Table slide PROM Flexion x15 Table slide PROM Abd x15 Gripping; pink Theraputty; composite fist, performed x 2 minutes Wrist AROM; flexion/extension, RD/UD, pronation/supination; reviewed for HEP  Cold pack (unbilled) - for anti-inflammatory and analgesic effect as needed for reduced pain and improved ability to participate in active PT intervention, along R shoulder in sitting, pillow propped under R forearm for upper limb support, x 5 minutes     PATIENT EDUCATION:  Education details: see above for patient education details Person educated: Patient Education  method: Explanation Education comprehension: verbalized understanding     HOME EXERCISE PROGRAM: Access CodeHC:[email or web address] URL: [email or web address] Date: 12/09/2022 Prepared by: Valentina Gu  Exercises - Supine Shoulder Flexion Extension AAROM with Dowel  - 2 x daily - 7 x weekly - 2 sets - 10 reps - Supine Shoulder External Rotation in 45 Degrees Abduction AAROM with Dowel  - 2 x daily - 7 x weekly - 2 sets - 10 reps - Seated Shoulder Flexion AAROM with Pulley Behind  - 2 x daily - 7 x weekly - 2 sets - 10 reps - Shoulder Flexion Wall Slide with Towel  - 2 x daily - 7 x weekly - 2 sets - 10 reps - 5sec hold - Isometric Shoulder Flexion at Wall  - 1 x daily - 7 x weekly - 1 sets - 15 reps - 5sec hold - Standing Isometric Shoulder External Rotation with Doorway  - 1 x daily - 7 x weekly - 2 sets - 15 reps - 5sec hold - Standing Isometric Shoulder Internal Rotation with Towel Roll at Doorway  - 1 x daily - 7 x weekly - 2 sets - 15 reps - 5sec hold     ASSESSMENT:   CLINICAL IMPRESSION: Patient demonstrated progression to symmetrical AROM with shoulder complex flexion and GHJ IR. Pt tolerates  standing ER/IR with resistance band well and demonstrated good ability to maintain form with minimal cueing for isometric hold position. Pt presents with pain in cervical region and pain in this region limited tolerance with GHJ motion. Pt presents with notable limitation remaining in shoulder complex abduction ROM. Pt needs further work on access to active ROM. Pt is able to increase emphasis on resistance drills today at low intensity without significant pain. Pt was limited today by cervical pain. Patient has ongoing post-operative impairments in R shoulder AROM and PROM, post-operative pain, decreased strength, R shoulder/arm pain. Patient will benefit from continued skilled PT to address above impairments and improve overall function.   REHAB POTENTIAL: Good   CLINICAL DECISION  MAKING: Stable/uncomplicated   EVALUATION COMPLEXITY: Low     GOALS: Goals reviewed with patient? Yes   SHORT TERM GOALS: Target date: 09/14/2022   Pt will be independent with HEP to minimize adverse effects of immobilization and gently mobilize R shoulder to improve pain-free function at home and work. Baseline: 08/17/22: Baseline HEP initiated.   11/17/22:  Pt is compliant with HEP and verbalizes understanding of given exercises Goal status: ACHIEVED     LONG TERM GOALS: Target date: 12/07/22   Pt will increase FOTO to at least 56 to demonstrate significant improvement in function at home and work related to neck pain  Baseline: 08/17/22: 4.   11/17/22: 61/56 Goal status: ACHIEVED   2.  Pt will decrease worst shoulder pain by at least 3 points on the NPRS in order to demonstrate clinically significant reduction in shoulder pain. Baseline: 08/17/22: 5-6/10 at worst over last week.   09/23/22: 2/10.  11/17/22: 2-3/10 at worst.    12/08/22: 2/10.  Goal status: IN PROGRESS    3.   Pt will have R shoulder AROM at least within 10 degrees of contralateral upper extremity indicative of improved ROM as needed for reaching, overhead work, self-care activities/dressing/grooming Baseline: 08/17/22: NO AROM presently, severely limited PROM in early post-op phase.  09/23/22: AROM not appropriate yet at this point post-operatively   11/17/22: Motion loss remains in all planes of motion.  12/08/22: met for flexion and IR, not met for ER and ABD.  Goal status: IN PROGRESS   4.   Pt will have R shoulder MMTs at 4+/5 or greater for all directions performed indicative of improved strength as needed for completion of daily lifting, reaching, carrying tasks (e.g. 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  09/23/22: still deferred at this point post-operatively.   11/17/22: MMT for R shoulder 3+ to 4 (see chart above).    12/08/22: Not  met for flexion, ABD, ER.  Goal status: ON-GOING   5.   Pt will perform unilateral farmer's carry with 15 lbs for 100 ft without reproduction of pain simulating carrying items in [employer] including carrying trash bag  Baseline: 08/17/22: Unable to perform lifting/carrying.  09/23/22: unable.   11/17/22: Deferred to later date.    12/08/22: Deferred due to remaining weakness.  Goal status: DEFERRED     PLAN: PT FREQUENCY: 2x/week   PT DURATION: 6 weeks   PLANNED INTERVENTIONS: Therapeutic exercises, Therapeutic activity, Neuromuscular re-education, Balance training, Gait training, Patient/Family education,  Joint mobilization, Electrical stimulation, Cryotherapy, Moist heat, and Manual therapy   PLAN FOR NEXT SESSION: Progress to full R shoulder ROM as tolerated. Continue with R shoulder AA/AROM and gradually progress with RTC isotonics as tolerated. Progress with strengthening as AROM is restored; increased emphasis on strengthening with successive visits.     Kaylyne Axton, SPT  Elia Nunley, Student-PT 12/22/2022, 5:53 PM

## 2022-12-23 ENCOUNTER — Encounter: Payer: Self-pay | Admitting: Physical Therapy

## 2022-12-23 ENCOUNTER — Ambulatory Visit: Payer: Medicare Other | Admitting: Physical Therapy

## 2022-12-23 DIAGNOSIS — M25511 Pain in right shoulder: Secondary | ICD-10-CM

## 2022-12-23 DIAGNOSIS — M25611 Stiffness of right shoulder, not elsewhere classified: Secondary | ICD-10-CM

## 2022-12-23 DIAGNOSIS — M6281 Muscle weakness (generalized): Secondary | ICD-10-CM

## 2022-12-23 NOTE — Therapy (Signed)
OUTPATIENT PHYSICAL THERAPY TREATMENT / PROGRESS NOTE    Patient Name: Shawn Atkins MRN: [id number] DOB:October 18, 1956, 67 y.o., male Today's Date: 12/22/2022  PCP: Lynnell Jude, MD REFERRING PROVIDER: [name], MD  END OF SESSION:   PT End of Session - 12/23/22 1601     Visit Number 30    Number of Visits 30    Date for PT Re-Evaluation 01/14/23    Authorization Type UHC Medicare, VL based on medical necessity    Authorization Time Period 08/18/23-10/26/22    Progress Note Due on Visit 30    PT Start Time 1459    PT Stop Time 1545    PT Time Calculation (min) 46 min    Activity Tolerance Patient limited by pain;Patient tolerated treatment well    Behavior During Therapy WFL for tasks assessed/performed                    Past Medical History:  Diagnosis Date   BPH (benign prostatic hypertrophy)    Bronchitis    Chronic prostatitis    Complication of anesthesia    Felt like "couldn't breathe" after triple Hernia surgery   Diabetes mellitus without complication (HCC)    Flank pain    GERD (gastroesophageal reflux disease)    h/o   Gross hematuria    History of kidney stones    HTN (hypertension)    pt states he takes lisinopril for kidney protection due to dm not htn   Inguinal hernia    Nocturia    OSA on CPAP    with 02 2 L   Overweight    PONV (postoperative nausea and vomiting)    only during kidney stone surgery   Spermatocele    Stricture of urethra    Vertigo    1 episode, 6-7 yrs ago   Past Surgical History:  Procedure Laterality Date   BICEPT TENODESIS Right 08/06/2022   Procedure: BICEPS TENODESIS;  Surgeon: [name], MD;  Location: [facility];  Service: Orthopedics;  Laterality: Right;   CATARACT EXTRACTION W/PHACO Left 07/22/2020   Procedure: CATARACT EXTRACTION PHACO AND INTRAOCULAR LENS PLACEMENT (I[facility]) LEFT DIABETIC 4.14  00:33.0;  Surgeon: Eulogio Bear, MD;  Location: [facility];  Service: Ophthalmology;   Laterality: Left;  Diabetic - oral meds   CATARACT EXTRACTION W/PHACO Right 08/12/2020   Procedure: CATARACT EXTRACTION PHACO AND INTRAOCULAR LENS PLACEMENT ([place]) RIGHT DIABETIC;  Surgeon: Eulogio Bear, MD;  Location: [place];  Service: Ophthalmology;  Laterality: Right;  1.99 0:26.2   COLONOSCOPY WITH PROPOFOL N/A 11/20/2015   Procedure: COLONOSCOPY WITH PROPOFOL;  Surgeon: Christene Lye, MD;  Location: [facility];  Service: Endoscopy;  Laterality: N/A;   HERNIA REPAIR Bilateral [id number]   umbilical and bil inguinal/ Dr Marina Gravel   KIDNEY STONE SURGERY     KNEE ARTHROSCOPY Left    open lithotomy     SHOULDER ARTHROSCOPY WITH OPEN ROTATOR CUFF REPAIR AND DISTAL CLAVICLE ACROMINECTOMY Right 08/06/2022   Procedure: SHOULDER ARTHROSCOPY WITH OPEN ROTATOR CUFF REPAIR AND DISTAL CLAVICLE ACROMINECTOMY;  Surgeon: [name], MD;  Location: [facility];  Service: Orthopedics;  Laterality: Right;   Patient Active Problem List   Diagnosis Date Noted   History of rectal bleeding 06/01/2018   Left groin pain 03/18/2018   Sacroiliac joint pain 01/13/2017   Abdominal pain, left lower quadrant 08/23/2015   BPH with obstruction/lower urinary tract symptoms 08/23/2015   Biceps tendinitis 06/25/2015    REFERRING DIAG:  M75.101 Unspecified rotator cuff tear or rupture of right shoulder, not specified as traumatic  THERAPY DIAG:  No diagnosis found.  Rationale for Evaluation and Treatment Rehabilitation   PERTINENT HISTORY: Pt is a 67 year old male s/p R shoulder rotator cuff repair and distal clavicle acromionectomy with Dr. Mack Guise on 08/06/22. Patient reports that his surgeon informed him he did have full tear of rotator cuff in surgery. Patient reports 4 days of severe pain just out of surgery. Patient reports managing pain well with Tylenol/Ibuprofen well now. Pt followed up with [facility] for erythema and increased temperature as well as notable edema; ruled out  infection and DVT. Patient reports comorbid neck and R upper trap pain. Pt reports difficulty with sleep positioning due to comorbid neck pain.    Pain:  Pain Intensity: Present: 2-3/10, Best: 2/10, Worst: 5-6/10 Pain location: R shoulder along deltoid and biceps Pain Quality: steady pain, intense intermittently; sharp pain along ACJ that comes and goes Radiating: Yes , to anterior arm Numbness/Tingling: No Aggravating factors: moving his R arm Relieving factors: Ibuprofen, Tylenol, resting arm/arm propped on armrest  History of prior shoulder or neck/shoulder injury, pain, surgery, or therapy: Yes, L shoulder with hx of cortisone injections, Hx of neck injury/PT for his neck Falls: Has patient fallen in last 6 months? No Dominant hand: left Imaging: Yes , MRI pre-operatively Prior level of function: Independent Occupational demands: Pt works part-time with [employer] (20 hrs/week), pt out of work now on medical leave Hobbies: [employer] (personal history of cancer, chills/fever, night sweats, nausea, vomiting, unrelenting pain): Negative   Precautions: None   Weight Bearing Restrictions: No   Living Environment Lives with: lives with their spouse Lives in: House/apartment     Patient Goals: Able to pick up weight with his arm, fish; able to play with baseball/bat with his grandson    PRECAUTIONS: No R shoulder AROM, no RUE lifting   DOS: 08/06/22, s/p R large RTC repair, R distal acromionectomy -8 weeks post-op 10/01/22 -12 weeks post-op 10/29/22   Next f/u with MD on 12/30/22   SUBJECTIVE:                                                                                                                                                                                      SUBJECTIVE STATEMENT:  Patient states that he feels about 80% back to normal. Pt would like to improve at reaching up overhead and being able to scratch his low back. Pt states that he is able to pick up a 20  bottle case of water off of the floor with both hands with no increase in pain. Pt reports that  he has not tried to lift anything heavy up over his head because of fear of pain.    PAIN:  Are you having pain? No pain reported at arrival Pain location: pain into R paracervical/upper trap region mainly     OBJECTIVE: (objective measures completed at initial evaluation unless otherwise dated)     Patient Surveys  FOTO: 4, predicted outcome score of 56     Cognition Patient is oriented to person, place, and time.  Recent memory is intact.  Remote memory is intact.  Attention span and concentration are intact.  Expressive speech is intact.  Patient's fund of knowledge is within normal limits for educational level.                          Gross Musculoskeletal Assessment Tremor: None Bulk: Atrophy of deltoid/posterior cuff mm Tone: Normal   Observation No sign of acute infection, mild erythema along R upper arm, mild increased tissue temperature along R middle deltoid     Posture FHRS posture     AROM             AROM (Normal range in degrees) AROM 08/17/2022 AROM 11/17/22 AROM 12/08/22 AROM 12/23/22    Right Left Right Left Right Left    Shoulder            Flexion Deferred   125 155 141 155 143 157  Extension Deferred          Abduction Deferred   128 165 147 165 155 WNL  External Rotation Deferred   80 WNL 87  WNL 86 WNL  Internal Rotation Deferred   50 WNL 70 WNL 57 WNL  Hands Behind Head Deferred   C7 T3 T2 T4 T2 T5  Hands Behind Back Deferred   L3 T12 L2 T12 L4 T7               Elbow            Flexion WNL          Extension WNL          Pronation 50          Supination 60          (* = pain; Blank rows = not tested)     PROM         PROM (Normal range in degrees) PROM 08/17/2022 PROM 11/17/22 PROM 12/08/22    Right Left Right Left Right Left  Shoulder          Flexion 40    160 157 166  Extension          Abduction     108 WNL 111   External  Rotation (arm at side) To neutral/0 deg    WNL ER at 90 deg ABD: 60 deg   Internal Rotation (arm at side) 45    WNL IR at 90 degr ABD: 70 deg         UE MMT:  MMT (out of 5) Right 11/17/22 Left 11/17/22 Right 12/08/22 Right  12/23/22           Shoulder     Flexion 3+  5  4- 4  Extension        Abduction 4-   4+ 4- 4  External rotation  4-  4+ 4 4  Internal rotation  4 5  4+ 5-  Horizontal abduction        Horizontal  adduction        Lower Trapezius        Rhomboids                 Elbow    Flexion  4+ '5 5 5  '$ Extension  4+  '5 5 5  '$ Pronation        Supination                 Wrist    Flexion        Extension        Radial deviation        Ulnar deviation        (* = pain; Blank rows = not tested)     Sensation Deferred   Reflexes Deferred     Palpation   Location LEFT  RIGHT           Subocciptials      Cervical paraspinals      Upper Trapezius      Levator Scapulae      Rhomboid Major/Minor      Sternoclavicular joint      Acromioclavicular joint   2  Coracoid process   1  Long head of biceps   1  Supraspinatus   2  Infraspinatus   1  Subscapularis      Teres Minor      Teres Major      Pectoralis Major      Pectoralis Minor      Anterior Deltoid   1  Lateral Deltoid   2  Posterior Deltoid      Latissimus Dorsi      Sternocleidomastoid      (Blank rows = not tested) Graded on 0-4 scale (0 = no pain, 1 = pain, 2 = pain with wincing/grimacing/flinching, 3 = pain with withdrawal, 4 = unwilling to allow palpation), (Blank rows = not tested)       TODAY'S TREATMENT 12/22/22   Manual Therapy - for R shoulder ROM and to prevent R shoulder stiffness; manual perturbations for re-training of stabilizers of GHJ   R shoulder PROM flexion, abduction, ER/IR within pt tolerance with gentle overpressure as able x 20 minutes    *not today* Gentle STM to R anterior and middle deltoid, biceps, upper trapezius; x 2 minutes  DTM/TPR R UT; x 3  minutes  -applied Biofreeze to R paracervical musculature and R upper trapezius for analgesic effect DTM/Tpr R upper trapezius; x 8 minutes   Therapeutic Exercise - for shoulder ROM as needed for reaching and grasping tasks, self-care ADLs, and household chores  - performance of MMT and AROM for updates with progress note   UE ergometer 2 minutes forward and 2 minutes backwards for muscle extensibility  - subjective obtained during this time  Rhythmic stabilization at 100 deg flexion; 2x1 minutes with multidirectional perturbations in static position    Wall slide, abduction; x10 with 5 sec hold    Standing ER step-outs for isometric contraction with red Tband; 1x8 (3 steps outs with tension)   - tactile cueing and demo for maintaining arm in neutral position    Standing IR step-outs for isometric contraction with red Tband; 1x8 (3 lateral steps with tension)   - tactile cueing and demo for maintaining arm in neutral position  Standing scaption with 2-lb Dbell; 1x10   -verbal cueing and demo for upright posture    PATIENT EDUCATION: Discussed expected progression of PT to progress with strengthening, continued  work on re-gaining full ROM at home    *not today* Sidelying shoulder abduction; 1x10, ROM as tolerated  Serratus slide, at wall on foam roller; 2x10, 5 sec hold at top Wand ER in standing, 90 deg abduction with arm propped on railing; 1x10, 3 sec Wall slide, flexion and abduction; x10 ea dir, 5 sec hold Sidelying ER; 2x10, 3-lb Dbell -verbal and tactile cueing for technique Standing Tband row, Blue Tband; 2x12 Finger ladder, x10, 5 sec hold at top for forward flexion  Supine flexion AROM; 2x10 Pulleys; shoulder complex abduction ; x 2 minutes Wand ER AAROM; 2x10 Table slide PROM Flexion x15 Table slide PROM Abd x15 Gripping; pink Theraputty; composite fist, performed x 2 minutes Wrist AROM; flexion/extension, RD/UD, pronation/supination; reviewed for HEP  Cold  pack (unbilled) - for anti-inflammatory and analgesic effect as needed for reduced pain and improved ability to participate in active PT intervention, along R shoulder in sitting, pillow propped under R forearm for upper limb support, x 5 minutes     PATIENT EDUCATION:  Education details: see above for patient education details Person educated: Patient Education method: Explanation Education comprehension: verbalized understanding     HOME EXERCISE PROGRAM: Access CodeHC:[email or web address] URL: [email or web address] Date: 12/09/2022 Prepared by: Valentina Gu  Exercises - Supine Shoulder Flexion Extension AAROM with Dowel  - 2 x daily - 7 x weekly - 2 sets - 10 reps - Supine Shoulder External Rotation in 45 Degrees Abduction AAROM with Dowel  - 2 x daily - 7 x weekly - 2 sets - 10 reps - Seated Shoulder Flexion AAROM with Pulley Behind  - 2 x daily - 7 x weekly - 2 sets - 10 reps - Shoulder Flexion Wall Slide with Towel  - 2 x daily - 7 x weekly - 2 sets - 10 reps - 5sec hold - Isometric Shoulder Flexion at Wall  - 1 x daily - 7 x weekly - 1 sets - 15 reps - 5sec hold - Standing Isometric Shoulder External Rotation with Doorway  - 1 x daily - 7 x weekly - 2 sets - 15 reps - 5sec hold - Standing Isometric Shoulder Internal Rotation with Towel Roll at Doorway  - 1 x daily - 7 x weekly - 2 sets - 15 reps - 5sec hold     ASSESSMENT:   CLINICAL IMPRESSION: Patient demonstrated minor progression with increase in right glenohumeral MMT strength with shoulder flexion and IR. Pt is progressing towards more symmetrical AROM with shoulder complex flexion and GHJ IR. Pt continues to present with notable limitation remaining in shoulder complex abduction ROM. Pt needs further work on access to active ROM. Pt tolerates standing ER/IR with resistance band well and demonstrated good ability to maintain form with minimal cueing for isometric hold position. Pt presents with pain in cervical region  and pain in this region limited tolerance with GHJ motion.  Pt is able to increase emphasis on resistance drills today at low intensity without significant pain. Patient has ongoing post-operative impairments in R shoulder AROM and PROM, post-operative pain, decreased strength, R shoulder/arm pain. Patient will benefit from continued skilled PT to address above impairments and improve overall function.   REHAB POTENTIAL: Good   CLINICAL DECISION MAKING: Stable/uncomplicated   EVALUATION COMPLEXITY: Low     GOALS: Goals reviewed with patient? Yes   SHORT TERM GOALS: Target date: 09/14/2022   Pt will be independent with HEP to minimize adverse effects of immobilization and gently mobilize R shoulder to  improve pain-free function at home and work. Baseline: 08/17/22: Baseline HEP initiated.   11/17/22:  Pt is compliant with HEP and verbalizes understanding of given exercises Goal status: ACHIEVED     LONG TERM GOALS: Target date: 12/07/22   Pt will increase FOTO to at least 56 to demonstrate significant improvement in function at home and work related to neck pain  Baseline: 08/17/22: 4.   11/17/22: 61/56 Goal status: ACHIEVED   2.  Pt will decrease worst shoulder pain by at least 3 points on the NPRS in order to demonstrate clinically significant reduction in shoulder pain. Baseline: 08/17/22: 5-6/10 at worst over last week.   09/23/22: 2/10.   11/17/22: 2-3/10 at worst.    12/08/22: 2/10.  12/23/22: 2-3/10  Goal status: IN PROGRESS    3.   Pt will have R shoulder AROM at least within 10 degrees of contralateral upper extremity indicative of improved ROM as needed for reaching, overhead work, self-care activities/dressing/grooming Baseline: 08/17/22: NO AROM presently, severely limited PROM in early post-op phase.  09/23/22: AROM not appropriate yet at this point post-operatively   11/17/22: Motion loss remains in all planes of motion.  12/08/22: met for flexion and IR, not met for ER and ABD.   Goal status: IN PROGRESS   4.   Pt will have R shoulder MMTs at 4+/5 or greater for all directions performed indicative of improved strength as needed for completion of daily lifting, reaching, carrying tasks (e.g. 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  09/23/22: still deferred at this point post-operatively.   11/17/22: MMT for R shoulder 3+ to 4 (see chart above).    12/08/22: Not met for flexion, ABD, ER.  Goal status: ON-GOING   5.   Pt will perform unilateral farmer's carry with 15 lbs for 100 ft without reproduction of pain simulating carrying items in [employer] including carrying trash bag  Baseline: 08/17/22: Unable to perform lifting/carrying.  09/23/22: unable.   11/17/22: Deferred to later date.    12/08/22: Deferred due to remaining weakness.  Goal status: DEFERRED     PLAN: PT FREQUENCY: 2x/week   PT DURATION: 6 weeks   PLANNED INTERVENTIONS: Therapeutic exercises, Therapeutic activity, Neuromuscular re-education, Balance training, Gait training, Patient/Family education,  Joint mobilization, Electrical stimulation, Cryotherapy, Moist heat, and Manual therapy   PLAN FOR NEXT SESSION: Progress to full R shoulder ROM as tolerated. Continue with R shoulder AA/AROM and gradually progress with RTC isotonics as tolerated. Progress with strengthening as AROM is restored; increased emphasis on strengthening with successive visits.     Carnelia Oscar, SPT  Akaya Proffit, Student-PT 12/23/2022, 4:08 PM

## 2022-12-24 ENCOUNTER — Encounter: Payer: Medicare Other | Admitting: Physical Therapy

## 2022-12-29 ENCOUNTER — Ambulatory Visit: Payer: Medicare Other | Attending: Orthopedic Surgery | Admitting: Physical Therapy

## 2022-12-29 DIAGNOSIS — M25511 Pain in right shoulder: Secondary | ICD-10-CM | POA: Insufficient documentation

## 2022-12-29 DIAGNOSIS — M6281 Muscle weakness (generalized): Secondary | ICD-10-CM | POA: Insufficient documentation

## 2022-12-29 DIAGNOSIS — M542 Cervicalgia: Secondary | ICD-10-CM | POA: Insufficient documentation

## 2022-12-29 DIAGNOSIS — M25611 Stiffness of right shoulder, not elsewhere classified: Secondary | ICD-10-CM | POA: Diagnosis present

## 2022-12-29 NOTE — Therapy (Unsigned)
OUTPATIENT PHYSICAL THERAPY TREATMENT    Patient Name: Shawn Atkins MRN: [id number] DOB:11-Nov-1955, 67 y.o., male Today's Date: 12/29/2022   END OF SESSION:     Past Medical History:  Diagnosis Date   BPH (benign prostatic hypertrophy)    Bronchitis    Chronic prostatitis    Complication of anesthesia    Felt like "couldn't breathe" after triple Hernia surgery   Diabetes mellitus without complication (HCC)    Flank pain    GERD (gastroesophageal reflux disease)    h/o   Gross hematuria    History of kidney stones    HTN (hypertension)    pt states he takes lisinopril for kidney protection due to dm not htn   Inguinal hernia    Nocturia    OSA on CPAP    with 02 2 L   Overweight    PONV (postoperative nausea and vomiting)    only during kidney stone surgery   Spermatocele    Stricture of urethra    Vertigo    1 episode, 6-7 yrs ago   Past Surgical History:  Procedure Laterality Date   BICEPT TENODESIS Right 08/06/2022   Procedure: BICEPS TENODESIS;  Surgeon: [name], MD;  Location: [facility];  Service: Orthopedics;  Laterality: Right;   CATARACT EXTRACTION W/PHACO Left 07/22/2020   Procedure: CATARACT EXTRACTION PHACO AND INTRAOCULAR LENS PLACEMENT (IOC) LEFT DIABETIC 4.14  00:33.0;  Surgeon: Eulogio Bear, MD;  Location: [place];  Service: Ophthalmology;  Laterality: Left;  Diabetic - oral meds   CATARACT EXTRACTION W/PHACO Right 08/12/2020   Procedure: CATARACT EXTRACTION PHACO AND INTRAOCULAR LENS PLACEMENT ([place]) RIGHT DIABETIC;  Surgeon: Eulogio Bear, MD;  Location: [place];  Service: Ophthalmology;  Laterality: Right;  1.99 0:26.2   COLONOSCOPY WITH PROPOFOL N/A 11/20/2015   Procedure: COLONOSCOPY WITH PROPOFOL;  Surgeon: Christene Lye, MD;  Location: [facility];  Service: Endoscopy;  Laterality: N/A;   HERNIA REPAIR Bilateral [id number]   umbilical and bil inguinal/ Dr Marina Gravel   KIDNEY STONE SURGERY     KNEE  ARTHROSCOPY Left    open lithotomy     SHOULDER ARTHROSCOPY WITH OPEN ROTATOR CUFF REPAIR AND DISTAL CLAVICLE ACROMINECTOMY Right 08/06/2022   Procedure: SHOULDER ARTHROSCOPY WITH OPEN ROTATOR CUFF REPAIR AND DISTAL CLAVICLE ACROMINECTOMY;  Surgeon: [name], MD;  Location: [facility];  Service: Orthopedics;  Laterality: Right;   Patient Active Problem List   Diagnosis Date Noted   History of rectal bleeding 06/01/2018   Left groin pain 03/18/2018   Sacroiliac joint pain 01/13/2017   Abdominal pain, left lower quadrant 08/23/2015   BPH with obstruction/lower urinary tract symptoms 08/23/2015   Biceps tendinitis 06/25/2015     PCP: Lynnell Jude, MD REFERRING PROVIDER: [name], MD  REFERRING DIAG: M75.101 Unspecified rotator cuff tear or rupture of right shoulder, not specified as traumatic  THERAPY DIAG:  Acute pain of right shoulder  Stiffness of right shoulder, not elsewhere classified  Muscle weakness (generalized)  Rationale for Evaluation and Treatment Rehabilitation   PERTINENT HISTORY: Pt is a 67 year old male s/p R shoulder rotator cuff repair and distal clavicle acromionectomy with Dr. Mack Guise on 08/06/22. Patient reports that his surgeon informed him he did have full tear of rotator cuff in surgery. Patient reports 4 days of severe pain just out of surgery. Patient reports managing pain well with Tylenol/Ibuprofen well now. Pt followed up with [facility] for erythema and increased temperature as well as notable edema; ruled out  infection and DVT. Patient reports comorbid neck and R upper trap pain. Pt reports difficulty with sleep positioning due to comorbid neck pain.    Pain:  Pain Intensity: Present: 2-3/10, Best: 2/10, Worst: 5-6/10 Pain location: R shoulder along deltoid and biceps Pain Quality: steady pain, intense intermittently; sharp pain along ACJ that comes and goes Radiating: Yes , to anterior arm Numbness/Tingling: No Aggravating factors:  moving his R arm Relieving factors: Ibuprofen, Tylenol, resting arm/arm propped on armrest  History of prior shoulder or neck/shoulder injury, pain, surgery, or therapy: Yes, L shoulder with hx of cortisone injections, Hx of neck injury/PT for his neck Falls: Has patient fallen in last 6 months? No Dominant hand: left Imaging: Yes , MRI pre-operatively Prior level of function: Independent Occupational demands: Pt works part-time with [employer] (20 hrs/week), pt out of work now on medical leave Hobbies: [employer] (personal history of cancer, chills/fever, night sweats, nausea, vomiting, unrelenting pain): Negative   Precautions: None   Weight Bearing Restrictions: No   Living Environment Lives with: lives with their spouse Lives in: House/apartment     Patient Goals: Able to pick up weight with his arm, fish; able to play with baseball/bat with his grandson    PRECAUTIONS: No R shoulder AROM, no RUE lifting   DOS: 08/06/22, s/p R large RTC repair, R distal acromionectomy -8 weeks post-op 10/01/22 -12 weeks post-op 10/29/22   Next f/u with MD on 12/30/22   SUBJECTIVE:                                                                                                                                                                                      SUBJECTIVE STATEMENT:  Patient states he has some ongoing soreness affecting his R shoulder. He reports notable soreness after last visit. He had notable pain in thoracic and lumbar spine last night that improved with massage and self-care at home. He states it is better today. Pt reports compliance with his HEP.    PAIN:  Are you having pain? Mild pain in R shoulder at arrival; 2-3/10 pain starting session  Pain location: pain into R paracervical/upper trap region mainly     OBJECTIVE: (objective measures completed at initial evaluation unless otherwise dated)     Patient Surveys  FOTO: 4, predicted outcome score of 56      Cognition Patient is oriented to person, place, and time.  Recent memory is intact.  Remote memory is intact.  Attention span and concentration are intact.  Expressive speech is intact.  Patient's fund of knowledge is within normal limits for educational level.  Gross Musculoskeletal Assessment Tremor: None Bulk: Atrophy of deltoid/posterior cuff mm Tone: Normal   Observation No sign of acute infection, mild erythema along R upper arm, mild increased tissue temperature along R middle deltoid     Posture FHRS posture     AROM             AROM (Normal range in degrees) AROM 08/17/2022 AROM 11/17/22 AROM 12/08/22 AROM 12/23/22    Right Left Right Left Right Left Right left  Shoulder            Flexion Deferred   125 155 141 155 143 157  Extension Deferred          Abduction Deferred   128 165 147 165 155 WNL  External Rotation Deferred   80 WNL 87  WNL 86 WNL  Internal Rotation Deferred   50 WNL 70 WNL 57 WNL  Hands Behind Head Deferred   C7 T3 T2 T4 T2 T5  Hands Behind Back Deferred   L3 T12 L2 T12 L4 T7               Elbow            Flexion WNL          Extension WNL          Pronation 50          Supination 60          (* = pain; Blank rows = not tested)     PROM         PROM (Normal range in degrees) PROM 08/17/2022 PROM 11/17/22 PROM 12/08/22    Right Left Right Left Right Left  Shoulder          Flexion 40    160 157 166  Extension          Abduction     108 WNL 111   External Rotation (arm at side) To neutral/0 deg    WNL ER at 90 deg ABD: 60 deg   Internal Rotation (arm at side) 45    WNL IR at 90 degr ABD: 70 deg         UE MMT:  MMT (out of 5) Right 11/17/22 Left 11/17/22 Right 12/08/22 Right  12/23/22           Shoulder     Flexion 3+  5  4- 4  Extension        Abduction 4-   4+ 4- 4  External rotation  4-  4+ 4 4  Internal rotation  4 5  4+ 5-  Horizontal abduction        Horizontal adduction        Lower  Trapezius        Rhomboids                 Elbow    Flexion  4+ '5 5 5  '$ Extension  4+  '5 5 5  '$ Pronation        Supination                 Wrist    Flexion        Extension        Radial deviation        Ulnar deviation        (* = pain; Blank rows = not tested)     Sensation Deferred   Reflexes Deferred     Palpation  Location LEFT  RIGHT           Subocciptials      Cervical paraspinals      Upper Trapezius      Levator Scapulae      Rhomboid Major/Minor      Sternoclavicular joint      Acromioclavicular joint   2  Coracoid process   1  Long head of biceps   1  Supraspinatus   2  Infraspinatus   1  Subscapularis      Teres Minor      Teres Major      Pectoralis Major      Pectoralis Minor      Anterior Deltoid   1  Lateral Deltoid   2  Posterior Deltoid      Latissimus Dorsi      Sternocleidomastoid      (Blank rows = not tested) Graded on 0-4 scale (0 = no pain, 1 = pain, 2 = pain with wincing/grimacing/flinching, 3 = pain with withdrawal, 4 = unwilling to allow palpation), (Blank rows = not tested)       TODAY'S TREATMENT 12/29/2022   Manual Therapy - for R shoulder ROM and to prevent R shoulder stiffness; manual perturbations for re-training of stabilizers of GHJ   R shoulder PROM flexion, abduction, ER/IR within pt tolerance with gentle overpressure as able x 15 minutes    *not today* Gentle STM to R anterior and middle deltoid, biceps, upper trapezius; x 2 minutes  DTM/TPR R UT; x 3 minutes  -applied Biofreeze to R paracervical musculature and R upper trapezius for analgesic effect DTM/Tpr R upper trapezius; x 8 minutes   Therapeutic Exercise - for shoulder ROM as needed for reaching and grasping tasks, self-care ADLs, and household chores   UE ergometer 2 minutes forward and 2 minutes backwards for muscle extensibility  - subjective obtained during this time  Shoulder wand flexion with 4-lb cuff weight for gravity-assisted stretch into  end-range flexion; 2x10  Rhythmic stabilization at 100 deg flexion; 1 x 30 minutes with multidirectional perturbations in static position   Serratus slide, at wall on foam roller; 2x10, 5 sec hold at top  Wall slide, abduction; x10 with 5 sec hold   Standing ER step-outs for isometric contraction with red Tband; 1x8 (3 steps outs with tension)     Standing IR step-outs for isometric contraction with red Tband; 1x8 (3 lateral steps with tension)  - min verbal cueing for technique  Standing scaption with 2-lb Dbell; 1x10   -verbal cueing and demo for upright posture    PATIENT EDUCATION: Discussed expected progression of PT to progress with strengthening, continued work on re-gaining full ROM at home    *not today* Sidelying shoulder abduction; 1x10, ROM as tolerated  Wand ER in standing, 90 deg abduction with arm propped on railing; 1x10, 3 sec Wall slide, flexion and abduction; x10 ea dir, 5 sec hold Sidelying ER; 2x10, 3-lb Dbell -verbal and tactile cueing for technique Standing Tband row, Blue Tband; 2x12 Finger ladder, x10, 5 sec hold at top for forward flexion  Supine flexion AROM; 2x10 Pulleys; shoulder complex abduction ; x 2 minutes Wand ER AAROM; 2x10 Table slide PROM Flexion x15 Table slide PROM Abd x15 Gripping; pink Theraputty; composite fist, performed x 2 minutes Wrist AROM; flexion/extension, RD/UD, pronation/supination; reviewed for HEP     Cold pack (unbilled) - for anti-inflammatory and analgesic effect as needed for reduced pain and improved ability to participate in  active PT intervention, along R shoulder in sitting, pillow propped under R forearm for upper limb support, x 5 minutes    PATIENT EDUCATION:  Education details: see above for patient education details Person educated: Patient Education method: Explanation Education comprehension: verbalized understanding     HOME EXERCISE PROGRAM: Access Code: [email or web address] URL:  [email or web address] Date: 12/09/2022 Prepared by: Valentina Gu  Exercises - Supine Shoulder Flexion Extension AAROM with Dowel  - 2 x daily - 7 x weekly - 2 sets - 10 reps - Supine Shoulder External Rotation in 45 Degrees Abduction AAROM with Dowel  - 2 x daily - 7 x weekly - 2 sets - 10 reps - Seated Shoulder Flexion AAROM with Pulley Behind  - 2 x daily - 7 x weekly - 2 sets - 10 reps - Shoulder Flexion Wall Slide with Towel  - 2 x daily - 7 x weekly - 2 sets - 10 reps - 5sec hold - Isometric Shoulder Flexion at Wall  - 1 x daily - 7 x weekly - 1 sets - 15 reps - 5sec hold - Standing Isometric Shoulder External Rotation with Doorway  - 1 x daily - 7 x weekly - 2 sets - 15 reps - 5sec hold - Standing Isometric Shoulder Internal Rotation with Towel Roll at Doorway  - 1 x daily - 7 x weekly - 2 sets - 15 reps - 5sec hold     ASSESSMENT:   CLINICAL IMPRESSION: Patient demonstrated minor progression with increase in right glenohumeral MMT strength with shoulder flexion and IR. Pt is progressing towards more symmetrical AROM with shoulder complex flexion and GHJ IR. Pt continues to present with notable limitation remaining in shoulder complex abduction ROM. Pt needs further work on access to active ROM. Pt tolerates standing ER/IR with resistance band well and demonstrated good ability to maintain form with minimal cueing for isometric hold position. Pt presents with pain in cervical region and pain in this region limited tolerance with GHJ motion.  Pt is able to increase emphasis on resistance drills today at low intensity without significant pain. Patient has ongoing post-operative impairments in R shoulder AROM and PROM, post-operative pain, decreased strength, R shoulder/arm pain. Patient will benefit from continued skilled PT to address above impairments and improve overall function.   REHAB POTENTIAL: Good   CLINICAL DECISION MAKING: Stable/uncomplicated   EVALUATION  COMPLEXITY: Low     GOALS: Goals reviewed with patient? Yes   SHORT TERM GOALS: Target date: 09/14/2022   Pt will be independent with HEP to minimize adverse effects of immobilization and gently mobilize R shoulder to improve pain-free function at home and work. Baseline: 08/17/22: Baseline HEP initiated.   11/17/22:  Pt is compliant with HEP and verbalizes understanding of given exercises Goal status: ACHIEVED     LONG TERM GOALS: Target date: 12/07/22   Pt will increase FOTO to at least 56 to demonstrate significant improvement in function at home and work related to neck pain  Baseline: 08/17/22: 4.   11/17/22: 61/56 Goal status: ACHIEVED   2.  Pt will decrease worst shoulder pain by at least 3 points on the NPRS in order to demonstrate clinically significant reduction in shoulder pain. Baseline: 08/17/22: 5-6/10 at worst over last week.   09/23/22: 2/10.   11/17/22: 2-3/10 at worst.    12/08/22: 2/10.  12/23/22: 2-3/10  Goal status: IN PROGRESS    3.   Pt will have R shoulder AROM at least within 10 degrees of contralateral  upper extremity indicative of improved ROM as needed for reaching, overhead work, self-care activities/dressing/grooming Baseline: 08/17/22: NO AROM presently, severely limited PROM in early post-op phase.  09/23/22: AROM not appropriate yet at this point post-operatively   11/17/22: Motion loss remains in all planes of motion.  12/08/22: met for flexion and IR, not met for ER and ABD.  Goal status: IN PROGRESS   4.   Pt will have R shoulder MMTs at 4+/5 or greater for all directions performed indicative of improved strength as needed for completion of daily lifting, reaching, carrying tasks (e.g. 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  09/23/22: still deferred at this point post-operatively.   11/17/22: MMT for R shoulder 3+ to 4 (see chart above).    12/08/22: Not met for flexion, ABD, ER.   Goal status: ON-GOING   5.   Pt will perform unilateral farmer's carry with 15 lbs for 100 ft without reproduction of pain simulating carrying items in [employer] including carrying trash bag  Baseline: 08/17/22: Unable to perform lifting/carrying.  09/23/22: unable.   11/17/22: Deferred to later date.    12/08/22: Deferred due to remaining weakness.  Goal status: DEFERRED     PLAN: PT FREQUENCY: 2x/week   PT DURATION: 6 weeks   PLANNED INTERVENTIONS: Therapeutic exercises, Therapeutic activity, Neuromuscular re-education, Balance training, Gait training, Patient/Family education,  Joint mobilization, Electrical stimulation, Cryotherapy, Moist heat, and Manual therapy   PLAN FOR NEXT SESSION: Progress to full R shoulder ROM as tolerated. Continue with R shoulder AA/AROM and gradually progress with RTC isotonics as tolerated. Progress with strengthening as AROM is restored; increased emphasis on strengthening with successive visits.     Valentina Gu, PT, DPT [id number]  Eilleen Kempf, PT 12/29/2022, 12:58 PM

## 2022-12-31 ENCOUNTER — Encounter: Payer: Self-pay | Admitting: Physical Therapy

## 2022-12-31 ENCOUNTER — Ambulatory Visit: Payer: Medicare Other | Admitting: Physical Therapy

## 2022-12-31 DIAGNOSIS — M6281 Muscle weakness (generalized): Secondary | ICD-10-CM

## 2022-12-31 DIAGNOSIS — M25511 Pain in right shoulder: Secondary | ICD-10-CM | POA: Diagnosis not present

## 2022-12-31 DIAGNOSIS — M25611 Stiffness of right shoulder, not elsewhere classified: Secondary | ICD-10-CM

## 2022-12-31 NOTE — Therapy (Signed)
OUTPATIENT PHYSICAL THERAPY TREATMENT    Patient Name: LEVONNE MAST MRN: [id number] DOB:May 16, 1956, 67 y.o., male Today's Date: 12/31/2022  END OF SESSION:   PT End of Session - 12/31/22 1349     Visit Number 32    Number of Visits 42    Date for PT Re-Evaluation 01/14/23    Authorization Type [employer] Medicare, VL based on medical necessity    Progress Note Due on Visit 40    PT Start Time 1347    PT Stop Time 1430    PT Time Calculation (min) 43 min    Activity Tolerance Patient limited by pain;Patient tolerated treatment well    Behavior During Therapy WFL for tasks assessed/performed               Past Medical History:  Diagnosis Date   BPH (benign prostatic hypertrophy)    Bronchitis    Chronic prostatitis    Complication of anesthesia    Felt like "couldn't breathe" after triple Hernia surgery   Diabetes mellitus without complication (HCC)    Flank pain    GERD (gastroesophageal reflux disease)    h/o   Gross hematuria    History of kidney stones    HTN (hypertension)    pt states he takes lisinopril for kidney protection due to dm not htn   Inguinal hernia    Nocturia    OSA on CPAP    with 02 2 L   Overweight    PONV (postoperative nausea and vomiting)    only during kidney stone surgery   Spermatocele    Stricture of urethra    Vertigo    1 episode, 6-7 yrs ago   Past Surgical History:  Procedure Laterality Date   BICEPT TENODESIS Right 08/06/2022   Procedure: BICEPS TENODESIS;  Surgeon: [name], MD;  Location: [facility];  Service: Orthopedics;  Laterality: Right;   CATARACT EXTRACTION W/PHACO Left 07/22/2020   Procedure: CATARACT EXTRACTION PHACO AND INTRAOCULAR LENS PLACEMENT (IOC) LEFT DIABETIC 4.14  00:33.0;  Surgeon: Eulogio Bear, MD;  Location: [place];  Service: Ophthalmology;  Laterality: Left;  Diabetic - oral meds   CATARACT EXTRACTION W/PHACO Right 08/12/2020   Procedure: CATARACT EXTRACTION PHACO AND INTRAOCULAR  LENS PLACEMENT ([place]) RIGHT DIABETIC;  Surgeon: Eulogio Bear, MD;  Location: [place];  Service: Ophthalmology;  Laterality: Right;  1.99 0:26.2   COLONOSCOPY WITH PROPOFOL N/A 11/20/2015   Procedure: COLONOSCOPY WITH PROPOFOL;  Surgeon: Christene Lye, MD;  Location: [facility];  Service: Endoscopy;  Laterality: N/A;   HERNIA REPAIR Bilateral [id number]   umbilical and bil inguinal/ Dr Marina Gravel   KIDNEY STONE SURGERY     KNEE ARTHROSCOPY Left    open lithotomy     SHOULDER ARTHROSCOPY WITH OPEN ROTATOR CUFF REPAIR AND DISTAL CLAVICLE ACROMINECTOMY Right 08/06/2022   Procedure: SHOULDER ARTHROSCOPY WITH OPEN ROTATOR CUFF REPAIR AND DISTAL CLAVICLE ACROMINECTOMY;  Surgeon: [name], MD;  Location: [facility];  Service: Orthopedics;  Laterality: Right;   Patient Active Problem List   Diagnosis Date Noted   History of rectal bleeding 06/01/2018   Left groin pain 03/18/2018   Sacroiliac joint pain 01/13/2017   Abdominal pain, left lower quadrant 08/23/2015   BPH with obstruction/lower urinary tract symptoms 08/23/2015   Biceps tendinitis 06/25/2015     PCP: Lynnell Jude, MD REFERRING PROVIDER: [name], MD  REFERRING DIAG: M75.101 Unspecified rotator cuff tear or rupture of right shoulder, not specified as traumatic  THERAPY DIAG:  Acute pain of right shoulder  Stiffness of right shoulder, not elsewhere classified  Muscle weakness (generalized)  Rationale for Evaluation and Treatment Rehabilitation   PERTINENT HISTORY: Pt is a 67 year old male s/p R shoulder rotator cuff repair and distal clavicle acromionectomy with Dr. Mack Guise on 08/06/22. Patient reports that his surgeon informed him he did have full tear of rotator cuff in surgery. Patient reports 4 days of severe pain just out of surgery. Patient reports managing pain well with Tylenol/Ibuprofen well now. Pt followed up with [facility] for erythema and increased temperature as well as notable  edema; ruled out infection and DVT. Patient reports comorbid neck and R upper trap pain. Pt reports difficulty with sleep positioning due to comorbid neck pain.    Pain:  Pain Intensity: Present: 2-3/10, Best: 2/10, Worst: 5-6/10 Pain location: R shoulder along deltoid and biceps Pain Quality: steady pain, intense intermittently; sharp pain along ACJ that comes and goes Radiating: Yes , to anterior arm Numbness/Tingling: No Aggravating factors: moving his R arm Relieving factors: Ibuprofen, Tylenol, resting arm/arm propped on armrest  History of prior shoulder or neck/shoulder injury, pain, surgery, or therapy: Yes, L shoulder with hx of cortisone injections, Hx of neck injury/PT for his neck Falls: Has patient fallen in last 6 months? No Dominant hand: left Imaging: Yes , MRI pre-operatively Prior level of function: Independent Occupational demands: Pt works part-time with [employer] (20 hrs/week), pt out of work now on medical leave Hobbies: [employer] (personal history of cancer, chills/fever, night sweats, nausea, vomiting, unrelenting pain): Negative   Precautions: None   Weight Bearing Restrictions: No   Living Environment Lives with: lives with their spouse Lives in: House/apartment     Patient Goals: Able to pick up weight with his arm, fish; able to play with baseball/bat with his grandson    PRECAUTIONS: No R shoulder AROM, no RUE lifting   DOS: 08/06/22, s/p R large RTC repair, R distal acromionectomy -8 weeks post-op 10/01/22 -12 weeks post-op 10/29/22   Next f/u with MD on 12/30/22   SUBJECTIVE:                                                                                                                                                                                      SUBJECTIVE STATEMENT:  Patient reports mild soreness at arrival in R shoulder at arrival. He followed up with his surgeon yesterday; he is being kept out of work until 02/10/23. Pt's physician  informed him he needed to increase weight he is able to lift for return to work. Patient reports continuous pain affecting R paracervical region. He is planning to f/u  with neurosurgeon for this. He is concerned with remaining pain experienced with R shoulder elevation.    PAIN:  Are you having pain? Mild pain in R shoulder at arrival; 2-3/10 pain starting session  Pain location: pain into R paracervical/upper trap region mainly     OBJECTIVE: (objective measures completed at initial evaluation unless otherwise dated)     Patient Surveys  FOTO: 4, predicted outcome score of 56     Cognition Patient is oriented to person, place, and time.  Recent memory is intact.  Remote memory is intact.  Attention span and concentration are intact.  Expressive speech is intact.  Patient's fund of knowledge is within normal limits for educational level.                          Gross Musculoskeletal Assessment Tremor: None Bulk: Atrophy of deltoid/posterior cuff mm Tone: Normal   Observation No sign of acute infection, mild erythema along R upper arm, mild increased tissue temperature along R middle deltoid     Posture FHRS posture     AROM             AROM (Normal range in degrees) AROM 08/17/2022 AROM 11/17/22 AROM 12/08/22 AROM 12/23/22    Right Left Right Left Right Left Right left  Shoulder            Flexion Deferred   125 155 141 155 143 157  Extension Deferred          Abduction Deferred   128 165 147 165 155 WNL  External Rotation Deferred   80 WNL 87  WNL 86 WNL  Internal Rotation Deferred   50 WNL 70 WNL 57 WNL  Hands Behind Head Deferred   C7 T3 T2 T4 T2 T5  Hands Behind Back Deferred   L3 T12 L2 T12 L4 T7               Elbow            Flexion WNL          Extension WNL          Pronation 50          Supination 60          (* = pain; Blank rows = not tested)     PROM         PROM (Normal range in degrees) PROM 08/17/2022 PROM 11/17/22 PROM 12/08/22     Right Left Right Left Right Left  Shoulder          Flexion 40    160 157 166  Extension          Abduction     108 WNL 111   External Rotation (arm at side) To neutral/0 deg    WNL ER at 90 deg ABD: 60 deg   Internal Rotation (arm at side) 45    WNL IR at 90 degr ABD: 70 deg         UE MMT:  MMT (out of 5) Right 11/17/22 Left 11/17/22 Right 12/08/22 Right  12/23/22           Shoulder     Flexion 3+  5  4- 4  Extension        Abduction 4-   4+ 4- 4  External rotation  4-  4+ 4 4  Internal rotation  4 5  4+ 5-  Horizontal abduction  Horizontal adduction        Lower Trapezius        Rhomboids                 Elbow    Flexion  4+ '5 5 5  '$ Extension  4+  '5 5 5  '$ Pronation        Supination                 Wrist    Flexion        Extension        Radial deviation        Ulnar deviation        (* = pain; Blank rows = not tested)     Sensation Deferred   Reflexes Deferred     Palpation   Location LEFT  RIGHT           Subocciptials      Cervical paraspinals      Upper Trapezius      Levator Scapulae      Rhomboid Major/Minor      Sternoclavicular joint      Acromioclavicular joint   2  Coracoid process   1  Long head of biceps   1  Supraspinatus   2  Infraspinatus   1  Subscapularis      Teres Minor      Teres Major      Pectoralis Major      Pectoralis Minor      Anterior Deltoid   1  Lateral Deltoid   2  Posterior Deltoid      Latissimus Dorsi      Sternocleidomastoid      (Blank rows = not tested) Graded on 0-4 scale (0 = no pain, 1 = pain, 2 = pain with wincing/grimacing/flinching, 3 = pain with withdrawal, 4 = unwilling to allow palpation), (Blank rows = not tested)       TODAY'S TREATMENT 12/31/2022   Manual Therapy - for R shoulder ROM and to prevent R shoulder stiffness; manual perturbations for re-training of stabilizers of GHJ   R shoulder PROM flexion, abduction, ER/IR within pt tolerance with gentle overpressure as able x 12  minutes    *not today* Gentle STM to R anterior and middle deltoid, biceps, upper trapezius; x 2 minutes  DTM/TPR R UT; x 3 minutes  -applied Biofreeze to R paracervical musculature and R upper trapezius for analgesic effect DTM/Tpr R upper trapezius; x 8 minutes   Therapeutic Exercise - for shoulder ROM as needed for reaching and grasping tasks, self-care ADLs, and household chores   UE ergometer 2 minutes forward and 2 minutes backwards for muscle extensibility  - subjective obtained during this time  Rhythmic stabilization at 100 deg flexion; 1 x 3 minutes with multidirectional perturbations in static position   Shoulder wand flexion with 5-lb cuff weight for gravity-assisted stretch into end-range flexion; 2x10   Sidelying ER; 2x10, with 3-lb Dbell  -verbal and tactile cueing for scap retraction  Serratus slide, at wall on foam roller; 2x10, 5 sec hold at top; with yellow Tband   Wall slide, abduction; x10 with 5 sec hold   Standing ER step-outs for isometric contraction with Red Tband; 2x10 (3 steps outs with tension)    Standing IR step-outs for isometric contraction with Red Tband; 2x10 (3 lateral steps with tension)  - min verbal cueing for technique  Standing scaption with 3-lb Dbell; 2x12   -verbal cueing and  demo for upright posture and avoidance of scapular hiking compensation   PATIENT EDUCATION: Discussed current goals of PT, continued f/u with MD regarding comorbid neck pain and referred distal RUE symptoms    *not today* Sidelying shoulder abduction; 1x10, ROM as tolerated  Wand ER in standing, 90 deg abduction with arm propped on railing; 1x10, 3 sec Wall slide, flexion and abduction; x10 ea dir, 5 sec hold Sidelying ER; 2x10, 3-lb Dbell -verbal and tactile cueing for technique Standing Tband row, Blue Tband; 2x12 Finger ladder, x10, 5 sec hold at top for forward flexion  Supine flexion AROM; 2x10 Pulleys; shoulder complex abduction ; x 2  minutes Wand ER AAROM; 2x10 Table slide PROM Flexion x15 Table slide PROM Abd x15 Gripping; pink Theraputty; composite fist, performed x 2 minutes Wrist AROM; flexion/extension, RD/UD, pronation/supination; reviewed for HEP     Cold pack (unbilled) - for anti-inflammatory and analgesic effect as needed for reduced pain and improved ability to participate in active PT intervention, along R shoulder in sitting, pillow propped under R forearm for upper limb support, x 5 minutes    PATIENT EDUCATION:  Education details: see above for patient education details Person educated: Patient Education method: Explanation Education comprehension: verbalized understanding     HOME EXERCISE PROGRAM: Access CodeHC:[email or web address] URL: [email or web address] Date: 12/09/2022 Prepared by: Valentina Gu  Exercises - Supine Shoulder Flexion Extension AAROM with Dowel  - 2 x daily - 7 x weekly - 2 sets - 10 reps - Supine Shoulder External Rotation in 45 Degrees Abduction AAROM with Dowel  - 2 x daily - 7 x weekly - 2 sets - 10 reps - Seated Shoulder Flexion AAROM with Pulley Behind  - 2 x daily - 7 x weekly - 2 sets - 10 reps - Shoulder Flexion Wall Slide with Towel  - 2 x daily - 7 x weekly - 2 sets - 10 reps - 5sec hold - Isometric Shoulder Flexion at Wall  - 1 x daily - 7 x weekly - 1 sets - 15 reps - 5sec hold - Standing Isometric Shoulder External Rotation with Doorway  - 1 x daily - 7 x weekly - 2 sets - 15 reps - 5sec hold - Standing Isometric Shoulder Internal Rotation with Towel Roll at Doorway  - 1 x daily - 7 x weekly - 2 sets - 15 reps - 5sec hold     ASSESSMENT:   CLINICAL IMPRESSION: Patient demonstrates normalizing R shoulder PROM. He still does exhibit mild deficit in active shoulder elevation. He is progressing fairly well with light to moderate resistance drills; he unfortunately has continuous R paracervical pain with paresthesia/numbness affecting R styloid process region  and this does sometimes interfere with his ability to perform ADLs and perform exercise. Pt may undergo PT for cervical spine as well pending referral from MD. Patient has ongoing post-operative impairments in R shoulder AROM and PROM, post-operative pain, decreased strength, R shoulder/arm pain. Patient will benefit from continued skilled PT to address above impairments and improve overall function.   REHAB POTENTIAL: Good   CLINICAL DECISION MAKING: Stable/uncomplicated   EVALUATION COMPLEXITY: Low     GOALS: Goals reviewed with patient? Yes   SHORT TERM GOALS: Target date: 09/14/2022   Pt will be independent with HEP to minimize adverse effects of immobilization and gently mobilize R shoulder to improve pain-free function at home and work. Baseline: 08/17/22: Baseline HEP initiated.   11/17/22:  Pt is compliant with HEP and verbalizes understanding of given  exercises Goal status: ACHIEVED     LONG TERM GOALS: Target date: 12/07/22   Pt will increase FOTO to at least 56 to demonstrate significant improvement in function at home and work related to neck pain  Baseline: 08/17/22: 4.   11/17/22: 61/56 Goal status: ACHIEVED   2.  Pt will decrease worst shoulder pain by at least 3 points on the NPRS in order to demonstrate clinically significant reduction in shoulder pain. Baseline: 08/17/22: 5-6/10 at worst over last week.   09/23/22: 2/10.   11/17/22: 2-3/10 at worst.    12/08/22: 2/10.  12/23/22: 2-3/10  Goal status: IN PROGRESS    3.   Pt will have R shoulder AROM at least within 10 degrees of contralateral upper extremity indicative of improved ROM as needed for reaching, overhead work, self-care activities/dressing/grooming Baseline: 08/17/22: NO AROM presently, severely limited PROM in early post-op phase.  09/23/22: AROM not appropriate yet at this point post-operatively   11/17/22: Motion loss remains in all planes of motion.  12/08/22: met for flexion and IR, not met for ER and ABD.   Goal status: IN PROGRESS   4.   Pt will have R shoulder MMTs at 4+/5 or greater for all directions performed indicative of improved strength as needed for completion of daily lifting, reaching, carrying tasks (e.g. 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  09/23/22: still deferred at this point post-operatively.   11/17/22: MMT for R shoulder 3+ to 4 (see chart above).    12/08/22: Not met for flexion, ABD, ER.  Goal status: ON-GOING   5.   Pt will perform unilateral farmer's carry with 15 lbs for 100 ft without reproduction of pain simulating carrying items in [employer] including carrying trash bag  Baseline: 08/17/22: Unable to perform lifting/carrying.  09/23/22: unable.   11/17/22: Deferred to later date.    12/08/22: Deferred due to remaining weakness.  Goal status: DEFERRED     PLAN: PT FREQUENCY: 2x/week   PT DURATION: 6 weeks   PLANNED INTERVENTIONS: Therapeutic exercises, Therapeutic activity, Neuromuscular re-education, Balance training, Gait training, Patient/Family education,  Joint mobilization, Electrical stimulation, Cryotherapy, Moist heat, and Manual therapy   PLAN FOR NEXT SESSION: Progress to full R shoulder ROM as tolerated. Continue with R shoulder AA/AROM and gradually progress with RTC isotonics as tolerated. Progress with strengthening as AROM is restored; increased emphasis on strengthening with successive visits.     Valentina Gu, PT, DPT [id number]  Eilleen Kempf, PT 12/31/2022, 1:50 PM

## 2023-01-05 ENCOUNTER — Other Ambulatory Visit: Payer: Self-pay | Admitting: Urology

## 2023-01-05 ENCOUNTER — Encounter: Payer: Self-pay | Admitting: Physical Therapy

## 2023-01-05 ENCOUNTER — Ambulatory Visit: Payer: Medicare Other | Admitting: Physical Therapy

## 2023-01-05 DIAGNOSIS — E349 Endocrine disorder, unspecified: Secondary | ICD-10-CM

## 2023-01-05 DIAGNOSIS — M25511 Pain in right shoulder: Secondary | ICD-10-CM | POA: Diagnosis not present

## 2023-01-05 DIAGNOSIS — M25611 Stiffness of right shoulder, not elsewhere classified: Secondary | ICD-10-CM

## 2023-01-05 DIAGNOSIS — M6281 Muscle weakness (generalized): Secondary | ICD-10-CM

## 2023-01-05 NOTE — Therapy (Signed)
OUTPATIENT PHYSICAL THERAPY TREATMENT    Patient Name: Shawn Atkins MRN: [id number] DOB:14-Feb-1956, 67 y.o., male Today's Date: 01/05/2023  END OF SESSION:   PT End of Session - 01/05/23 1352     Visit Number 33    Number of Visits 42    Date for PT Re-Evaluation 01/14/23    Authorization Type [employer] Medicare, VL based on medical necessity    Progress Note Due on Visit 40    PT Start Time 1347    PT Stop Time 1430    PT Time Calculation (min) 43 min    Activity Tolerance Patient limited by pain;Patient tolerated treatment well    Behavior During Therapy WFL for tasks assessed/performed                Past Medical History:  Diagnosis Date   BPH (benign prostatic hypertrophy)    Bronchitis    Chronic prostatitis    Complication of anesthesia    Felt like "couldn't breathe" after triple Hernia surgery   Diabetes mellitus without complication (HCC)    Flank pain    GERD (gastroesophageal reflux disease)    h/o   Gross hematuria    History of kidney stones    HTN (hypertension)    pt states he takes lisinopril for kidney protection due to dm not htn   Inguinal hernia    Nocturia    OSA on CPAP    with 02 2 L   Overweight    PONV (postoperative nausea and vomiting)    only during kidney stone surgery   Spermatocele    Stricture of urethra    Vertigo    1 episode, 6-7 yrs ago   Past Surgical History:  Procedure Laterality Date   BICEPT TENODESIS Right 08/06/2022   Procedure: BICEPS TENODESIS;  Surgeon: [name], MD;  Location: [facility];  Service: Orthopedics;  Laterality: Right;   CATARACT EXTRACTION W/PHACO Left 07/22/2020   Procedure: CATARACT EXTRACTION PHACO AND INTRAOCULAR LENS PLACEMENT ([facility]) LEFT DIABETIC 4.14  00:33.0;  Surgeon: Eulogio Bear, MD;  Location: [facility];  Service: Ophthalmology;  Laterality: Left;  Diabetic - oral meds   CATARACT EXTRACTION W/PHACO Right 08/12/2020   Procedure: CATARACT EXTRACTION PHACO AND  INTRAOCULAR LENS PLACEMENT ([place]) RIGHT DIABETIC;  Surgeon: Eulogio Bear, MD;  Location: [place];  Service: Ophthalmology;  Laterality: Right;  1.99 0:26.2   COLONOSCOPY WITH PROPOFOL N/A 11/20/2015   Procedure: COLONOSCOPY WITH PROPOFOL;  Surgeon: Christene Lye, MD;  Location: [facility];  Service: Endoscopy;  Laterality: N/A;   HERNIA REPAIR Bilateral [id number]   umbilical and bil inguinal/ Dr Marina Gravel   KIDNEY STONE SURGERY     KNEE ARTHROSCOPY Left    open lithotomy     SHOULDER ARTHROSCOPY WITH OPEN ROTATOR CUFF REPAIR AND DISTAL CLAVICLE ACROMINECTOMY Right 08/06/2022   Procedure: SHOULDER ARTHROSCOPY WITH OPEN ROTATOR CUFF REPAIR AND DISTAL CLAVICLE ACROMINECTOMY;  Surgeon: [name], MD;  Location: [facility];  Service: Orthopedics;  Laterality: Right;   Patient Active Problem List   Diagnosis Date Noted   History of rectal bleeding 06/01/2018   Left groin pain 03/18/2018   Sacroiliac joint pain 01/13/2017   Abdominal pain, left lower quadrant 08/23/2015   BPH with obstruction/lower urinary tract symptoms 08/23/2015   Biceps tendinitis 06/25/2015     PCP: Lynnell Jude, MD REFERRING PROVIDER: [name], MD  REFERRING DIAG: M75.101 Unspecified rotator cuff tear or rupture of right shoulder, not specified as  traumatic  THERAPY DIAG:  Acute pain of right shoulder  Stiffness of right shoulder, not elsewhere classified  Muscle weakness (generalized)  Rationale for Evaluation and Treatment Rehabilitation   PERTINENT HISTORY: Pt is a 67 year old male s/p R shoulder rotator cuff repair and distal clavicle acromionectomy with Dr. Mack Guise on 08/06/22. Patient reports that his surgeon informed him he did have full tear of rotator cuff in surgery. Patient reports 4 days of severe pain just out of surgery. Patient reports managing pain well with Tylenol/Ibuprofen well now. Pt followed up with [facility] for erythema and increased temperature as well  as notable edema; ruled out infection and DVT. Patient reports comorbid neck and R upper trap pain. Pt reports difficulty with sleep positioning due to comorbid neck pain.    Pain:  Pain Intensity: Present: 2-3/10, Best: 2/10, Worst: 5-6/10 Pain location: R shoulder along deltoid and biceps Pain Quality: steady pain, intense intermittently; sharp pain along ACJ that comes and goes Radiating: Yes , to anterior arm Numbness/Tingling: No Aggravating factors: moving his R arm Relieving factors: Ibuprofen, Tylenol, resting arm/arm propped on armrest  History of prior shoulder or neck/shoulder injury, pain, surgery, or therapy: Yes, L shoulder with hx of cortisone injections, Hx of neck injury/PT for his neck Falls: Has patient fallen in last 6 months? No Dominant hand: left Imaging: Yes , MRI pre-operatively Prior level of function: Independent Occupational demands: Pt works part-time with [employer] (20 hrs/week), pt out of work now on medical leave Hobbies: [employer] (personal history of cancer, chills/fever, night sweats, nausea, vomiting, unrelenting pain): Negative   Precautions: None   Weight Bearing Restrictions: No   Living Environment Lives with: lives with their spouse Lives in: House/apartment     Patient Goals: Able to pick up weight with his arm, fish; able to play with baseball/bat with his grandson    PRECAUTIONS: No R shoulder AROM, no RUE lifting   DOS: 08/06/22, s/p R large RTC repair, R distal acromionectomy -8 weeks post-op 10/01/22 -12 weeks post-op 10/29/22   Next f/u with MD on 12/30/22   SUBJECTIVE:                                                                                                                                                                                      SUBJECTIVE STATEMENT:  Patient reports notable neck pain since waking up this AM. 3-4/10 pain at arrival. He reports pain in R>L UT, "across the shoulders."  He has completed neck  stretching discussed at previous sessions. Pt reports discomfort with elevating his R arm above shoulder height.    PAIN:  Are you having pain? Mild pain in  R shoulder at arrival; 3-4/10 pain starting session  Pain location: pain into R paracervical/upper trap region mainly     OBJECTIVE: (objective measures completed at initial evaluation unless otherwise dated)     Patient Surveys  FOTO: 4, predicted outcome score of 56     Cognition Patient is oriented to person, place, and time.  Recent memory is intact.  Remote memory is intact.  Attention span and concentration are intact.  Expressive speech is intact.  Patient's fund of knowledge is within normal limits for educational level.                          Gross Musculoskeletal Assessment Tremor: None Bulk: Atrophy of deltoid/posterior cuff mm Tone: Normal   Observation No sign of acute infection, mild erythema along R upper arm, mild increased tissue temperature along R middle deltoid     Posture FHRS posture     AROM             AROM (Normal range in degrees) AROM 08/17/2022 AROM 11/17/22 AROM 12/08/22 AROM 12/23/22    Right Left Right Left Right Left Right left  Shoulder            Flexion Deferred   125 155 141 155 143 157  Extension Deferred          Abduction Deferred   128 165 147 165 155 WNL  External Rotation Deferred   80 WNL 87  WNL 86 WNL  Internal Rotation Deferred   50 WNL 70 WNL 57 WNL  Hands Behind Head Deferred   C7 T3 T2 T4 T2 T5  Hands Behind Back Deferred   L3 T12 L2 T12 L4 T7               Elbow            Flexion WNL          Extension WNL          Pronation 50          Supination 60          (* = pain; Blank rows = not tested)     PROM         PROM (Normal range in degrees) PROM 08/17/2022 PROM 11/17/22 PROM 12/08/22    Right Left Right Left Right Left  Shoulder          Flexion 40    160 157 166  Extension          Abduction     108 WNL 111   External Rotation (arm at  side) To neutral/0 deg    WNL ER at 90 deg ABD: 60 deg   Internal Rotation (arm at side) 45    WNL IR at 90 degr ABD: 70 deg         UE MMT:  MMT (out of 5) Right 11/17/22 Left 11/17/22 Right 12/08/22 Right  12/23/22           Shoulder     Flexion 3+  5  4- 4  Extension        Abduction 4-   4+ 4- 4  External rotation  4-  4+ 4 4  Internal rotation  4 5  4+ 5-  Horizontal abduction        Horizontal adduction        Lower Trapezius        Rhomboids  Elbow    Flexion  4+ '5 5 5  '$ Extension  4+  '5 5 5  '$ Pronation        Supination                 Wrist    Flexion        Extension        Radial deviation        Ulnar deviation        (* = pain; Blank rows = not tested)     Sensation Deferred   Reflexes Deferred     Palpation   Location LEFT  RIGHT           Subocciptials      Cervical paraspinals      Upper Trapezius      Levator Scapulae      Rhomboid Major/Minor      Sternoclavicular joint      Acromioclavicular joint   2  Coracoid process   1  Long head of biceps   1  Supraspinatus   2  Infraspinatus   1  Subscapularis      Teres Minor      Teres Major      Pectoralis Major      Pectoralis Minor      Anterior Deltoid   1  Lateral Deltoid   2  Posterior Deltoid      Latissimus Dorsi      Sternocleidomastoid      (Blank rows = not tested) Graded on 0-4 scale (0 = no pain, 1 = pain, 2 = pain with wincing/grimacing/flinching, 3 = pain with withdrawal, 4 = unwilling to allow palpation), (Blank rows = not tested)       TODAY'S TREATMENT 01/05/2023   Manual Therapy - for R shoulder ROM and to prevent R shoulder stiffness; manual perturbations for re-training of stabilizers of GHJ   R shoulder PROM flexion, abduction, ER/IR within pt tolerance with gentle overpressure as able x 12 minutes DTM/TPR bilat  UT; x 3 minutes -applied Biofreeze to R paracervical musculature and R upper trapezius for analgesic effect   *not today* Gentle STM  to R anterior and middle deltoid, biceps, upper trapezius; x 2 minutes DTM/Tpr R upper trapezius; x 8 minutes   Therapeutic Exercise - for shoulder ROM as needed for reaching and grasping tasks, self-care ADLs, and household chores   UE ergometer 2 minutes forward and 2 minutes backwards for muscle extensibility  - subjective obtained during this time intermittently, 1 minute not billed   Shoulder wand flexion with 5-lb cuff weight for gravity-assisted stretch into end-range flexion; 2x10   Sidelying ER; 2x10, with 3-lb Dbell  -verbal and tactile cueing for scap retraction   Wall slide, abduction; x10 with 5 sec hold  Serratus slide, at wall on foam roller; 2x8, 5 sec hold at top; with yellow Tband    Standing ER with Red Tband, towel roll under arm; 2x10      PATIENT EDUCATION: Discussed current goals of PT, continued f/u with MD regarding comorbid neck pain and referred distal RUE symptoms   *next visit* Standing scaption with 3-lb Dbell; 2x12   -verbal cueing and demo for upright posture and avoidance of scapular hiking compensation   *not today* Standing IR step-outs for isometric contraction with Red Tband; 2x10 (3 lateral steps with tension)  - min verbal cueing for technique Rhythmic stabilization at 100 deg flexion; 1 x 3 minutes with multidirectional perturbations in  static position  Sidelying shoulder abduction; 1x10, ROM as tolerated  Wand ER in standing, 90 deg abduction with arm propped on railing; 1x10, 3 sec Wall slide, flexion and abduction; x10 ea dir, 5 sec hold Sidelying ER; 2x10, 3-lb Dbell -verbal and tactile cueing for technique Standing Tband row, Blue Tband; 2x12 Finger ladder, x10, 5 sec hold at top for forward flexion  Supine flexion AROM; 2x10 Pulleys; shoulder complex abduction ; x 2 minutes Wand ER AAROM; 2x10 Table slide PROM Flexion x15 Table slide PROM Abd x15 Gripping; pink Theraputty; composite fist, performed x 2 minutes Wrist AROM;  flexion/extension, RD/UD, pronation/supination; reviewed for HEP     Cold pack (unbilled) - for anti-inflammatory and analgesic effect as needed for reduced pain and improved ability to participate in active PT intervention, along R shoulder in sitting, pillow propped under R forearm for upper limb support, x 5 minutes    PATIENT EDUCATION:  Education details: see above for patient education details Person educated: Patient Education method: Explanation Education comprehension: verbalized understanding     HOME EXERCISE PROGRAM: Access [email or web address] URL: [email or web address] Date: 12/09/2022 Prepared by: Valentina Gu  Exercises - Supine Shoulder Flexion Extension AAROM with Dowel  - 2 x daily - 7 x weekly - 2 sets - 10 reps - Supine Shoulder External Rotation in 45 Degrees Abduction AAROM with Dowel  - 2 x daily - 7 x weekly - 2 sets - 10 reps - Seated Shoulder Flexion AAROM with Pulley Behind  - 2 x daily - 7 x weekly - 2 sets - 10 reps - Shoulder Flexion Wall Slide with Towel  - 2 x daily - 7 x weekly - 2 sets - 10 reps - 5sec hold - Isometric Shoulder Flexion at Wall  - 1 x daily - 7 x weekly - 1 sets - 15 reps - 5sec hold - Standing Isometric Shoulder External Rotation with Doorway  - 1 x daily - 7 x weekly - 2 sets - 15 reps - 5sec hold - Standing Isometric Shoulder Internal Rotation with Towel Roll at Doorway  - 1 x daily - 7 x weekly - 2 sets - 15 reps - 5sec hold     ASSESSMENT:   CLINICAL IMPRESSION: Patient has comorbid cervicalgia and pain along upper trapezius. 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 Pt is markedly improving with ROM and has passive flexion, ER, and IR approaching WNL. He has moderate shoulder complex abduction deficit. He is still lacking in standing shoulder elevation AROM, though this is continuing to improve. We are gradually increasing emphasis on RTC  strengthening with successive visits. We utilized manual therapy today for pain along upper traps and advised pt to continue f/u for cervicalgia with MD. Patient has ongoing post-operative impairments in R shoulder AROM and PROM, post-operative pain, decreased strength, R shoulder/arm pain. Patient will benefit from continued skilled PT to address above impairments and improve overall function.   REHAB POTENTIAL: Good   CLINICAL DECISION MAKING: Stable/uncomplicated   EVALUATION COMPLEXITY: Low     GOALS: Goals reviewed with patient? Yes   SHORT TERM GOALS: Target date: 09/14/2022   Pt will be independent with HEP to minimize adverse effects of immobilization and gently mobilize R shoulder to improve pain-free function at home and work. Baseline: 08/17/22: Baseline HEP initiated.   11/17/22:  Pt is compliant with HEP and verbalizes understanding of given exercises Goal status: ACHIEVED     LONG TERM  GOALS: Target date: 12/07/22   Pt will increase FOTO to at least 56 to demonstrate significant improvement in function at home and work related to neck pain  Baseline: 08/17/22: 4.   11/17/22: 61/56 Goal status: ACHIEVED   2.  Pt will decrease worst shoulder pain by at least 3 points on the NPRS in order to demonstrate clinically significant reduction in shoulder pain. Baseline: 08/17/22: 5-6/10 at worst over last week.   09/23/22: 2/10.   11/17/22: 2-3/10 at worst.    12/08/22: 2/10.  12/23/22: 2-3/10  Goal status: IN PROGRESS    3.   Pt will have R shoulder AROM at least within 10 degrees of contralateral upper extremity indicative of improved ROM as needed for reaching, overhead work, self-care activities/dressing/grooming Baseline: 08/17/22: NO AROM presently, severely limited PROM in early post-op phase.  09/23/22: AROM not appropriate yet at this point post-operatively   11/17/22: Motion loss remains in all planes of motion.  12/08/22: met for flexion and IR, not met for ER and ABD.  Goal  status: IN PROGRESS   4.   Pt will have R shoulder MMTs at 4+/5 or greater for all directions performed indicative of improved strength as needed for completion of daily lifting, reaching, carrying tasks (e.g. 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  09/23/22: still deferred at this point post-operatively.   11/17/22: MMT for R shoulder 3+ to 4 (see chart above).    12/08/22: Not met for flexion, ABD, ER.  Goal status: ON-GOING   5.   Pt will perform unilateral farmer's carry with 15 lbs for 100 ft without reproduction of pain simulating carrying items in [employer] including carrying trash bag  Baseline: 08/17/22: Unable to perform lifting/carrying.  09/23/22: unable.   11/17/22: Deferred to later date.    12/08/22: Deferred due to remaining weakness.  Goal status: DEFERRED     PLAN: PT FREQUENCY: 2x/week   PT DURATION: 6 weeks   PLANNED INTERVENTIONS: Therapeutic exercises, Therapeutic activity, Neuromuscular re-education, Balance training, Gait training, Patient/Family education,  Joint mobilization, Electrical stimulation, Cryotherapy, Moist heat, and Manual therapy   PLAN FOR NEXT SESSION: Progress to full R shoulder ROM as tolerated. Continue with R shoulder AA/AROM and gradually progress with RTC isotonics as tolerated. Progress with strengthening as AROM is restored; increased emphasis on strengthening with successive visits.     Valentina Gu, PT, DPT [id number]  Eilleen Kempf, PT 01/05/2023, 2:27 PM

## 2023-01-07 ENCOUNTER — Telehealth: Payer: Self-pay | Admitting: Neurosurgery

## 2023-01-07 ENCOUNTER — Ambulatory Visit: Payer: Medicare Other | Admitting: Physical Therapy

## 2023-01-07 NOTE — Telephone Encounter (Signed)
Patient states he has had PT and injections at [facility] in the past year. The CD is at my desk for whenever you are ready for it. Dr.Krusinski told him that his neck and shoulder pain is coming from his neck and recommended a consult with Dr.Yarbrough. he did have shoulder surgery also. I have a release to fax to [facility] once the fax machine is up and running.

## 2023-01-07 NOTE — Telephone Encounter (Signed)
-----   Message from Peggyann Shoals sent at 01/07/2023  8:56 AM EDT ----- Regarding: RE: Shawn Atkins Referral Patient's wife will come on Monday to sign release to get notes from [facility]. ----- Message ----- From: Peggyann Shoals Sent: 01/06/2023   8:50 AM EDT To: [facility] Subject: RE: Shawn Atkins Referral                      Left message to call back ----- Message ----- From: Orson Eva Sent: 01/05/2023   4:56 PM EDT To: Peggyann Shoals Subject: Shawn Atkins Referral                          Wife called @4 :40 on 3/12 to request a Monday appt for her husband. States a referral was sent over from [facility]. Does has images on a disc.   CB: [phone] (wife's number Hilda Blades)

## 2023-01-11 ENCOUNTER — Inpatient Hospital Stay
Admission: RE | Admit: 2023-01-11 | Discharge: 2023-01-11 | Disposition: A | Discharge status: Home / Self Care | Payer: Self-pay | Source: Ambulatory Visit | Attending: Neurosurgery | Admitting: Neurosurgery

## 2023-01-11 ENCOUNTER — Other Ambulatory Visit: Payer: Self-pay

## 2023-01-11 DIAGNOSIS — Z049 Encounter for examination and observation for unspecified reason: Secondary | ICD-10-CM

## 2023-01-11 NOTE — Telephone Encounter (Signed)
Loaded CD with cervical xray from 05/21/22 and cervical MRI from 06/16/22. Waiting on radiology reports and PT/injection notes and records from [facility].

## 2023-01-11 NOTE — Telephone Encounter (Signed)
On your desk

## 2023-01-12 ENCOUNTER — Ambulatory Visit: Payer: Medicare Other | Admitting: Physical Therapy

## 2023-01-12 ENCOUNTER — Encounter: Payer: Self-pay | Admitting: Physical Therapy

## 2023-01-12 DIAGNOSIS — M25511 Pain in right shoulder: Secondary | ICD-10-CM | POA: Diagnosis not present

## 2023-01-12 DIAGNOSIS — M6281 Muscle weakness (generalized): Secondary | ICD-10-CM

## 2023-01-12 DIAGNOSIS — M25611 Stiffness of right shoulder, not elsewhere classified: Secondary | ICD-10-CM

## 2023-01-12 NOTE — Therapy (Unsigned)
OUTPATIENT PHYSICAL THERAPY TREATMENT    Patient Name: Shawn Atkins MRN: [id number] DOB:[birth year], 67 y.o., male Today's Date: 01/12/2023  END OF SESSION:   PT End of Session - 01/12/23 1355     Visit Number 34    Number of Visits 42    Date for PT Re-Evaluation 01/14/23    Authorization Type UHC Medicare, VL based on medical necessity    Progress Note Due on Visit 40    PT Start Time 1351    PT Stop Time 1431    PT Time Calculation (min) 40 min    Activity Tolerance Patient limited by pain;Patient tolerated treatment well    Behavior During Therapy WFL for tasks assessed/performed              Past Medical History:  Diagnosis Date   BPH (benign prostatic hypertrophy)    Bronchitis    Chronic prostatitis    Complication of anesthesia    Felt like "couldn't breathe" after triple Hernia surgery   Diabetes mellitus without complication (HCC)    Flank pain    GERD (gastroesophageal reflux disease)    h/o   Gross hematuria    History of kidney stones    HTN (hypertension)    pt states he takes lisinopril for kidney protection due to dm not htn   Inguinal hernia    Nocturia    OSA on CPAP    with 02 2 L   Overweight    PONV (postoperative nausea and vomiting)    only during kidney stone surgery   Spermatocele    Stricture of urethra    Vertigo    1 episode, 6-7 yrs ago   Past Surgical History:  Procedure Laterality Date   BICEPT TENODESIS Right 08/06/2022   Procedure: BICEPS TENODESIS;  Surgeon: [name], MD;  Location: [facility];  Service: Orthopedics;  Laterality: Right;   CATARACT EXTRACTION W/PHACO Left 07/22/2020   Procedure: CATARACT EXTRACTION PHACO AND INTRAOCULAR LENS PLACEMENT (IOC) LEFT DIABETIC 4.14  00:33.0;  Surgeon: Eulogio Bear, MD;  Location: [place];  Service: Ophthalmology;  Laterality: Left;  Diabetic - oral meds   CATARACT EXTRACTION W/PHACO Right 08/12/2020   Procedure: CATARACT EXTRACTION PHACO AND INTRAOCULAR  LENS PLACEMENT ([place]) RIGHT DIABETIC;  Surgeon: Eulogio Bear, MD;  Location: [place];  Service: Ophthalmology;  Laterality: Right;  1.99 0:26.2   COLONOSCOPY WITH PROPOFOL N/A 11/20/2015   Procedure: COLONOSCOPY WITH PROPOFOL;  Surgeon: Christene Lye, MD;  Location: [facility];  Service: Endoscopy;  Laterality: N/A;   HERNIA REPAIR Bilateral [id number]   umbilical and bil inguinal/ Dr Marina Gravel   KIDNEY STONE SURGERY     KNEE ARTHROSCOPY Left    open lithotomy     SHOULDER ARTHROSCOPY WITH OPEN ROTATOR CUFF REPAIR AND DISTAL CLAVICLE ACROMINECTOMY Right 08/06/2022   Procedure: SHOULDER ARTHROSCOPY WITH OPEN ROTATOR CUFF REPAIR AND DISTAL CLAVICLE ACROMINECTOMY;  Surgeon: [name], MD;  Location: [facility];  Service: Orthopedics;  Laterality: Right;   Patient Active Problem List   Diagnosis Date Noted   History of rectal bleeding 06/01/2018   Left groin pain 03/18/2018   Sacroiliac joint pain 01/13/2017   Abdominal pain, left lower quadrant 08/23/2015   BPH with obstruction/lower urinary tract symptoms 08/23/2015   Biceps tendinitis 06/25/2015     PCP: Lynnell Jude, MD REFERRING PROVIDER: [name], MD  REFERRING DIAG: M75.101 Unspecified rotator cuff tear or rupture of right shoulder, not specified as traumatic  THERAPY DIAG:  Acute pain of right shoulder  Stiffness of right shoulder, not elsewhere classified  Muscle weakness (generalized)  Rationale for Evaluation and Treatment Rehabilitation   PERTINENT HISTORY: Pt is a 67 year old male s/p R shoulder rotator cuff repair and distal clavicle acromionectomy with Dr. Mack Guise on 08/06/22. Patient reports that his surgeon informed him he did have full tear of rotator cuff in surgery. Patient reports 4 days of severe pain just out of surgery. Patient reports managing pain well with Tylenol/Ibuprofen well now. Pt followed up with [facility] for erythema and increased temperature as well as notable  edema; ruled out infection and DVT. Patient reports comorbid neck and R upper trap pain. Pt reports difficulty with sleep positioning due to comorbid neck pain.    Pain:  Pain Intensity: Present: 2-3/10, Best: 2/10, Worst: 5-6/10 Pain location: R shoulder along deltoid and biceps Pain Quality: steady pain, intense intermittently; sharp pain along ACJ that comes and goes Radiating: Yes , to anterior arm Numbness/Tingling: No Aggravating factors: moving his R arm Relieving factors: Ibuprofen, Tylenol, resting arm/arm propped on armrest  History of prior shoulder or neck/shoulder injury, pain, surgery, or therapy: Yes, L shoulder with hx of cortisone injections, Hx of neck injury/PT for his neck Falls: Has patient fallen in last 6 months? No Dominant hand: left Imaging: Yes , MRI pre-operatively Prior level of function: Independent Occupational demands: Pt works part-time with [employer] (20 hrs/week), pt out of work now on medical leave Hobbies: [employer] (personal history of cancer, chills/fever, night sweats, nausea, vomiting, unrelenting pain): Negative   Precautions: None   Weight Bearing Restrictions: No   Living Environment Lives with: lives with their spouse Lives in: House/apartment     Patient Goals: Able to pick up weight with his arm, fish; able to play with baseball/bat with his grandson    PRECAUTIONS: No R shoulder AROM, no RUE lifting   DOS: 08/06/22, s/p R large RTC repair, R distal acromionectomy -8 weeks post-op 10/01/22 -12 weeks post-op 10/29/22   Next f/u with MD on 12/30/22   SUBJECTIVE:                                                                                                                                                                                      SUBJECTIVE STATEMENT:  Patient reports 3-4/10 pain with lifting his arm affecting R lateral shoulder; he reports notable pain in R side of his neck and UT at arrival today. He is waiting for  phone call from neurosurgeon's office - pt provided paperwork for the office including disc with his cervical spine MRI.    PAIN:  Are you having  pain? Mild pain in R shoulder at arrival; 3-4/10 pain starting session  Pain location: pain into R paracervical/upper trap region mainly     OBJECTIVE: (objective measures completed at initial evaluation unless otherwise dated)     Patient Surveys  FOTO: 4, predicted outcome score of 56     Cognition Patient is oriented to person, place, and time.  Recent memory is intact.  Remote memory is intact.  Attention span and concentration are intact.  Expressive speech is intact.  Patient's fund of knowledge is within normal limits for educational level.                          Gross Musculoskeletal Assessment Tremor: None Bulk: Atrophy of deltoid/posterior cuff mm Tone: Normal   Observation No sign of acute infection, mild erythema along R upper arm, mild increased tissue temperature along R middle deltoid     Posture FHRS posture     AROM             AROM (Normal range in degrees) AROM 08/17/2022 AROM 11/17/22 AROM 12/08/22 AROM 12/23/22    Right Left Right Left Right Left Right left  Shoulder            Flexion Deferred   125 155 141 155 143 157  Extension Deferred          Abduction Deferred   128 165 147 165 155 WNL  External Rotation Deferred   80 WNL 87  WNL 86 WNL  Internal Rotation Deferred   50 WNL 70 WNL 57 WNL  Hands Behind Head Deferred   C7 T3 T2 T4 T2 T5  Hands Behind Back Deferred   L3 T12 L2 T12 L4 T7               Elbow            Flexion WNL          Extension WNL          Pronation 50          Supination 60          (* = pain; Blank rows = not tested)     PROM         PROM (Normal range in degrees) PROM 08/17/2022 PROM 11/17/22 PROM 12/08/22    Right Left Right Left Right Left  Shoulder          Flexion 40    160 157 166  Extension          Abduction     108 WNL 111   External Rotation  (arm at side) To neutral/0 deg    WNL ER at 90 deg ABD: 60 deg   Internal Rotation (arm at side) 45    WNL IR at 90 degr ABD: 70 deg         UE MMT:  MMT (out of 5) Right 11/17/22 Left 11/17/22 Right 12/08/22 Right  12/23/22           Shoulder     Flexion 3+  5  4- 4  Extension        Abduction 4-   4+ 4- 4  External rotation  4-  4+ 4 4  Internal rotation  4 5  4+ 5-  Horizontal abduction        Horizontal adduction        Lower Trapezius        Rhomboids  Elbow    Flexion  4+ 5 5 5   Extension  4+  5 5 5   Pronation        Supination                 Wrist    Flexion        Extension        Radial deviation        Ulnar deviation        (* = pain; Blank rows = not tested)     Sensation Deferred   Reflexes Deferred     Palpation   Location LEFT  RIGHT           Subocciptials      Cervical paraspinals      Upper Trapezius      Levator Scapulae      Rhomboid Major/Minor      Sternoclavicular joint      Acromioclavicular joint   2  Coracoid process   1  Long head of biceps   1  Supraspinatus   2  Infraspinatus   1  Subscapularis      Teres Minor      Teres Major      Pectoralis Major      Pectoralis Minor      Anterior Deltoid   1  Lateral Deltoid   2  Posterior Deltoid      Latissimus Dorsi      Sternocleidomastoid      (Blank rows = not tested) Graded on 0-4 scale (0 = no pain, 1 = pain, 2 = pain with wincing/grimacing/flinching, 3 = pain with withdrawal, 4 = unwilling to allow palpation), (Blank rows = not tested)       TODAY'S TREATMENT 01/12/2023   Manual Therapy - for R shoulder ROM and to prevent R shoulder stiffness; manual perturbations for re-training of stabilizers of GHJ   R shoulder PROM flexion, abduction, ER/IR within pt tolerance with gentle overpressure as able x 12 minutes DTM/TPR bilat  UT; x 3 minutes -applied Biofreeze to R paracervical musculature and R upper trapezius for analgesic effect   *not  today* Gentle STM to R anterior and middle deltoid, biceps, upper trapezius; x 2 minutes DTM/Tpr R upper trapezius; x 8 minutes   Therapeutic Exercise - for shoulder ROM as needed for reaching and grasping tasks, self-care ADLs, and household chores   UE ergometer 2 minutes forward and 2 minutes backwards for muscle extensibility  - subjective obtained during this time intermittently, 1 minute not billed   Shoulder wand flexion with 5-lb cuff weight for gravity-assisted stretch into end-range flexion; 2x10   Sidelying ER; 2x10, with 3-lb Dbell  -verbal and tactile cueing for scap retraction   Wall slide, abduction; x10 with 5 sec hold  Serratus slide, at wall on foam roller; 2x8, 5 sec hold at top; with yellow Tband    Standing ER with Red Tband, towel roll under arm; 2x10    Standing scaption with 3-lb Dbell; 2x12   -verbal cueing and demo for upright posture and avoidance of scapular hiking compensation   PATIENT EDUCATION: Discussed current goals of PT, continued f/u with MD regarding comorbid neck pain and referred distal RUE symptoms    *not today* Standing IR step-outs for isometric contraction with Red Tband; 2x10 (3 lateral steps with tension)  - min verbal cueing for technique Rhythmic stabilization at 100 deg flexion; 1 x 3 minutes with multidirectional perturbations in static position  Sidelying shoulder abduction; 1x10, ROM as tolerated  Wand ER in standing, 90 deg abduction with arm propped on railing; 1x10, 3 sec Wall slide, flexion and abduction; x10 ea dir, 5 sec hold Sidelying ER; 2x10, 3-lb Dbell -verbal and tactile cueing for technique Standing Tband row, Blue Tband; 2x12 Finger ladder, x10, 5 sec hold at top for forward flexion  Supine flexion AROM; 2x10 Pulleys; shoulder complex abduction ; x 2 minutes Wand ER AAROM; 2x10 Table slide PROM Flexion x15 Table slide PROM Abd x15 Gripping; pink Theraputty; composite fist, performed x 2 minutes Wrist AROM;  flexion/extension, RD/UD, pronation/supination; reviewed for HEP     Cold pack (unbilled) - for anti-inflammatory and analgesic effect as needed for reduced pain and improved ability to participate in active PT intervention, along R shoulder in sitting, pillow propped under R forearm for upper limb support, x 5 minutes    PATIENT EDUCATION:  Education details: see above for patient education details Person educated: Patient Education method: Explanation Education comprehension: verbalized understanding     HOME EXERCISE PROGRAM: Access CodeHC:[email or web address] URL: [email or web address] Date: 12/09/2022 Prepared by: Valentina Gu  Exercises - Supine Shoulder Flexion Extension AAROM with Dowel  - 2 x daily - 7 x weekly - 2 sets - 10 reps - Supine Shoulder External Rotation in 45 Degrees Abduction AAROM with Dowel  - 2 x daily - 7 x weekly - 2 sets - 10 reps - Seated Shoulder Flexion AAROM with Pulley Behind  - 2 x daily - 7 x weekly - 2 sets - 10 reps - Shoulder Flexion Wall Slide with Towel  - 2 x daily - 7 x weekly - 2 sets - 10 reps - 5sec hold - Isometric Shoulder Flexion at Wall  - 1 x daily - 7 x weekly - 1 sets - 15 reps - 5sec hold - Standing Isometric Shoulder External Rotation with Doorway  - 1 x daily - 7 x weekly - 2 sets - 15 reps - 5sec hold - Standing Isometric Shoulder Internal Rotation with Towel Roll at Doorway  - 1 x daily - 7 x weekly - 2 sets - 15 reps - 5sec hold     ASSESSMENT:   CLINICAL IMPRESSION: Patient has comorbid cervicalgia and pain along upper trapezius. 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 Pt is markedly improving with ROM and has passive flexion, ER, and IR approaching WNL. He has moderate shoulder complex abduction deficit. He is still lacking in standing shoulder elevation AROM, though this is continuing to improve. We are gradually increasing emphasis on RTC  strengthening with successive visits. We utilized manual therapy today for pain along upper traps and advised pt to continue f/u for cervicalgia with MD. Patient has ongoing post-operative impairments in R shoulder AROM and PROM, post-operative pain, decreased strength, R shoulder/arm pain. Patient will benefit from continued skilled PT to address above impairments and improve overall function.   REHAB POTENTIAL: Good   CLINICAL DECISION MAKING: Stable/uncomplicated   EVALUATION COMPLEXITY: Low     GOALS: Goals reviewed with patient? Yes   SHORT TERM GOALS: Target date: 09/14/2022   Pt will be independent with HEP to minimize adverse effects of immobilization and gently mobilize R shoulder to improve pain-free function at home and work. Baseline: 08/17/22: Baseline HEP initiated.   11/17/22:  Pt is compliant with HEP and verbalizes understanding of given exercises Goal status: ACHIEVED     LONG TERM GOALS: Target date:  12/07/22   Pt will increase FOTO to at least 56 to demonstrate significant improvement in function at home and work related to neck pain  Baseline: 08/17/22: 4.   11/17/22: 61/56 Goal status: ACHIEVED   2.  Pt will decrease worst shoulder pain by at least 3 points on the NPRS in order to demonstrate clinically significant reduction in shoulder pain. Baseline: 08/17/22: 5-6/10 at worst over last week.   09/23/22: 2/10.   11/17/22: 2-3/10 at worst.    12/08/22: 2/10.  12/23/22: 2-3/10  Goal status: IN PROGRESS    3.   Pt will have R shoulder AROM at least within 10 degrees of contralateral upper extremity indicative of improved ROM as needed for reaching, overhead work, self-care activities/dressing/grooming Baseline: 08/17/22: NO AROM presently, severely limited PROM in early post-op phase.  09/23/22: AROM not appropriate yet at this point post-operatively   11/17/22: Motion loss remains in all planes of motion.  12/08/22: met for flexion and IR, not met for ER and ABD.  Goal  status: IN PROGRESS   4.   Pt will have R shoulder MMTs at 4+/5 or greater for all directions performed indicative of improved strength as needed for completion of daily lifting, reaching, carrying tasks (e.g. 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  09/23/22: still deferred at this point post-operatively.   11/17/22: MMT for R shoulder 3+ to 4 (see chart above).    12/08/22: Not met for flexion, ABD, ER.  Goal status: ON-GOING   5.   Pt will perform unilateral farmer's carry with 15 lbs for 100 ft without reproduction of pain simulating carrying items in [employer] including carrying trash bag  Baseline: 08/17/22: Unable to perform lifting/carrying.  09/23/22: unable.   11/17/22: Deferred to later date.    12/08/22: Deferred due to remaining weakness.  Goal status: DEFERRED     PLAN: PT FREQUENCY: 2x/week   PT DURATION: 6 weeks   PLANNED INTERVENTIONS: Therapeutic exercises, Therapeutic activity, Neuromuscular re-education, Balance training, Gait training, Patient/Family education,  Joint mobilization, Electrical stimulation, Cryotherapy, Moist heat, and Manual therapy   PLAN FOR NEXT SESSION: Progress to full R shoulder ROM as tolerated. Continue with R shoulder AA/AROM and gradually progress with RTC isotonics as tolerated. Progress with strengthening as AROM is restored; increased emphasis on strengthening with successive visits.     Valentina Gu, PT, DPT [id number]  Eilleen Kempf, PT 01/12/2023, 1:55 PM

## 2023-01-13 NOTE — Telephone Encounter (Signed)
11/04/22 10.5 Dr.Bliss they will fax over report

## 2023-01-13 NOTE — Telephone Encounter (Signed)
If we get the radiology report and records in time, he can be added to 2pm tomorrow. Also, the last A1c in his chart is from 06/2022 and was 9.2. When you call them, can you ask if they have an updated one through their PCP (we don't have access to the PCP records). If he has a more recent one, we will need a copy of it.

## 2023-01-13 NOTE — Telephone Encounter (Signed)
Wife called asking if we had received the notes because patient is in a lot of pain. I called [facility] and they said that the medical records request was closed because our date of birth was not what they had on file for him. I corrected the date of birth and refaxed. Can I offer him tomorrow at 2pm?

## 2023-01-14 ENCOUNTER — Ambulatory Visit: Payer: Medicare Other | Admitting: Physical Therapy

## 2023-01-14 ENCOUNTER — Encounter: Payer: Self-pay | Admitting: Physical Therapy

## 2023-01-14 DIAGNOSIS — M6281 Muscle weakness (generalized): Secondary | ICD-10-CM

## 2023-01-14 DIAGNOSIS — M25511 Pain in right shoulder: Secondary | ICD-10-CM

## 2023-01-14 DIAGNOSIS — M25611 Stiffness of right shoulder, not elsewhere classified: Secondary | ICD-10-CM

## 2023-01-14 NOTE — Therapy (Signed)
OUTPATIENT PHYSICAL THERAPY TREATMENT    Patient Name: Shawn Atkins MRN: [id number] DOB:1955-10-29, 67 y.o., male Today's Date: 01/14/2023  END OF SESSION:   PT End of Session - 01/18/23 0834     Visit Number 35    Number of Visits 42    Date for PT Re-Evaluation 01/14/23    Authorization Type UHC Medicare, VL based on medical necessity    Progress Note Due on Visit 40    PT Start Time 1347    PT Stop Time 1429    PT Time Calculation (min) 42 min    Activity Tolerance Patient limited by pain;Patient tolerated treatment well    Behavior During Therapy WFL for tasks assessed/performed             Past Medical History:  Diagnosis Date   BPH (benign prostatic hypertrophy)    Bronchitis    Chronic prostatitis    Complication of anesthesia    Felt like "couldn't breathe" after triple Hernia surgery   Diabetes mellitus without complication (HCC)    Flank pain    GERD (gastroesophageal reflux disease)    h/o   Gross hematuria    History of kidney stones    HTN (hypertension)    pt states he takes lisinopril for kidney protection due to dm not htn   Inguinal hernia    Nocturia    OSA on CPAP    with 02 2 L   Overweight    PONV (postoperative nausea and vomiting)    only during kidney stone surgery   Spermatocele    Stricture of urethra    Vertigo    1 episode, 6-7 yrs ago   Past Surgical History:  Procedure Laterality Date   BICEPT TENODESIS Right 08/06/2022   Procedure: BICEPS TENODESIS;  Surgeon: [name], MD;  Location: [facility];  Service: Orthopedics;  Laterality: Right;   CATARACT EXTRACTION W/PHACO Left 07/22/2020   Procedure: CATARACT EXTRACTION PHACO AND INTRAOCULAR LENS PLACEMENT ([facility]) LEFT DIABETIC 4.14  00:33.0;  Surgeon: Eulogio Bear, MD;  Location: [place];  Service: Ophthalmology;  Laterality: Left;  Diabetic - oral meds   CATARACT EXTRACTION W/PHACO Right 08/12/2020   Procedure: CATARACT EXTRACTION PHACO AND INTRAOCULAR  LENS PLACEMENT ([place]) RIGHT DIABETIC;  Surgeon: Eulogio Bear, MD;  Location: [place];  Service: Ophthalmology;  Laterality: Right;  1.99 0:26.2   COLONOSCOPY WITH PROPOFOL N/A 11/20/2015   Procedure: COLONOSCOPY WITH PROPOFOL;  Surgeon: Christene Lye, MD;  Location: [facility];  Service: Endoscopy;  Laterality: N/A;   HERNIA REPAIR Bilateral 0865   umbilical and bil inguinal/ Dr Marina Gravel   KIDNEY STONE SURGERY     KNEE ARTHROSCOPY Left    open lithotomy     SHOULDER ARTHROSCOPY WITH OPEN ROTATOR CUFF REPAIR AND DISTAL CLAVICLE ACROMINECTOMY Right 08/06/2022   Procedure: SHOULDER ARTHROSCOPY WITH OPEN ROTATOR CUFF REPAIR AND DISTAL CLAVICLE ACROMINECTOMY;  Surgeon: [name], MD;  Location: [facility];  Service: Orthopedics;  Laterality: Right;   Patient Active Problem List   Diagnosis Date Noted   History of rectal bleeding 06/01/2018   Left groin pain 03/18/2018   Sacroiliac joint pain 01/13/2017   Abdominal pain, left lower quadrant 08/23/2015   BPH with obstruction/lower urinary tract symptoms 08/23/2015   Biceps tendinitis 06/25/2015     PCP: Lynnell Jude, MD REFERRING PROVIDER: [name], MD  REFERRING DIAG: M75.101 Unspecified rotator cuff tear or rupture of right shoulder, not specified as traumatic  THERAPY  DIAG:  Acute pain of right shoulder  Stiffness of right shoulder, not elsewhere classified  Muscle weakness (generalized)  Rationale for Evaluation and Treatment Rehabilitation   PERTINENT HISTORY: Pt is a 67 year old male s/p R shoulder rotator cuff repair and distal clavicle acromionectomy with Dr. Mack Guise on 08/06/22. Patient reports that his surgeon informed him he did have full tear of rotator cuff in surgery. Patient reports 4 days of severe pain just out of surgery. Patient reports managing pain well with Tylenol/Ibuprofen well now. Pt followed up with [facility] for erythema and increased temperature as well as notable  edema; ruled out infection and DVT. Patient reports comorbid neck and R upper trap pain. Pt reports difficulty with sleep positioning due to comorbid neck pain.    Pain:  Pain Intensity: Present: 2-3/10, Best: 2/10, Worst: 5-6/10 Pain location: R shoulder along deltoid and biceps Pain Quality: steady pain, intense intermittently; sharp pain along ACJ that comes and goes Radiating: Yes , to anterior arm Numbness/Tingling: No Aggravating factors: moving his R arm Relieving factors: Ibuprofen, Tylenol, resting arm/arm propped on armrest  History of prior shoulder or neck/shoulder injury, pain, surgery, or therapy: Yes, L shoulder with hx of cortisone injections, Hx of neck injury/PT for his neck Falls: Has patient fallen in last 6 months? No Dominant hand: left Imaging: Yes , MRI pre-operatively Prior level of function: Independent Occupational demands: Pt works part-time with [employer] (20 hrs/week), pt out of work now on medical leave Hobbies: [employer] (personal history of cancer, chills/fever, night sweats, nausea, vomiting, unrelenting pain): Negative   Precautions: None   Weight Bearing Restrictions: No   Living Environment Lives with: lives with their spouse Lives in: House/apartment     Patient Goals: Able to pick up weight with his arm, fish; able to play with baseball/bat with his grandson    PRECAUTIONS: No R shoulder AROM, no RUE lifting    DOS: 08/06/22, s/p R large RTC repair, R distal acromionectomy -8 weeks post-op 10/01/22 -12 weeks post-op 10/29/22    SUBJECTIVE:                                                                                                                                                                                      SUBJECTIVE STATEMENT:  Patient reports mild R shoulder soreness at arrival. No notable neck pain at arrival; he states his neck is worse at night.     PAIN:  Are you having pain? No Pain location: pain into R  paracervical/upper trap region mainly     OBJECTIVE: (objective measures completed at initial evaluation unless otherwise dated)     Patient Surveys  FOTO: 4,  predicted outcome score of 56     Cognition Patient is oriented to person, place, and time.  Recent memory is intact.  Remote memory is intact.  Attention span and concentration are intact.  Expressive speech is intact.  Patient's fund of knowledge is within normal limits for educational level.                          Gross Musculoskeletal Assessment Tremor: None Bulk: Atrophy of deltoid/posterior cuff mm Tone: Normal   Observation No sign of acute infection, mild erythema along R upper arm, mild increased tissue temperature along R middle deltoid     Posture FHRS posture     AROM             AROM (Normal range in degrees) AROM 08/17/2022 AROM 11/17/22 AROM 12/08/22 AROM 12/23/22    Right Left Right Left Right Left Right left  Shoulder            Flexion Deferred   125 155 141 155 143 157  Extension Deferred          Abduction Deferred   128 165 147 165 155 WNL  External Rotation Deferred   80 WNL 87  WNL 86 WNL  Internal Rotation Deferred   50 WNL 70 WNL 57 WNL  Hands Behind Head Deferred   C7 T3 T2 T4 T2 T5  Hands Behind Back Deferred   L3 T12 L2 T12 L4 T7               Elbow            Flexion WNL          Extension WNL          Pronation 50          Supination 60          (* = pain; Blank rows = not tested)     PROM         PROM (Normal range in degrees) PROM 08/17/2022 PROM 11/17/22 PROM 12/08/22    Right Left Right Left Right Left  Shoulder          Flexion 40    160 157 166  Extension          Abduction     108 WNL 111   External Rotation (arm at side) To neutral/0 deg    WNL ER at 90 deg ABD: 60 deg   Internal Rotation (arm at side) 45    WNL IR at 90 degr ABD: 70 deg         UE MMT:  MMT (out of 5) Right 11/17/22 Left 11/17/22 Right 12/08/22 Right  12/23/22           Shoulder      Flexion 3+  5  4- 4  Extension        Abduction 4-   4+ 4- 4  External rotation  4-  4+ 4 4  Internal rotation  4 5  4+ 5-  Horizontal abduction        Horizontal adduction        Lower Trapezius        Rhomboids                 Elbow    Flexion  4+ 5 5 5   Extension  4+  5 5 5   Pronation        Supination  Wrist    Flexion        Extension        Radial deviation        Ulnar deviation        (* = pain; Blank rows = not tested)     Sensation Deferred   Reflexes Deferred     Palpation   Location LEFT  RIGHT           Subocciptials      Cervical paraspinals      Upper Trapezius      Levator Scapulae      Rhomboid Major/Minor      Sternoclavicular joint      Acromioclavicular joint   2  Coracoid process   1  Long head of biceps   1  Supraspinatus   2  Infraspinatus   1  Subscapularis      Teres Minor      Teres Major      Pectoralis Major      Pectoralis Minor      Anterior Deltoid   1  Lateral Deltoid   2  Posterior Deltoid      Latissimus Dorsi      Sternocleidomastoid      (Blank rows = not tested) Graded on 0-4 scale (0 = no pain, 1 = pain, 2 = pain with wincing/grimacing/flinching, 3 = pain with withdrawal, 4 = unwilling to allow palpation), (Blank rows = not tested)       TODAY'S TREATMENT 01/14/2023   Manual Therapy - for R shoulder ROM and to prevent R shoulder stiffness; manual perturbations for re-training of stabilizers of GHJ   R shoulder PROM flexion, abduction, ER/IR within pt tolerance with gentle overpressure as able x 12 minutes    *not today* DTM/TPR bilat  UT; x 3 minutes -applied Biofreeze to R paracervical musculature and R upper trapezius for analgesic effect Gentle STM to R anterior and middle deltoid, biceps, upper trapezius; x 2 minutes DTM/Tpr R upper trapezius; x 8 minutes   Therapeutic Exercise - for shoulder ROM as needed for reaching and grasping tasks, self-care ADLs, and household chores   UE  ergometer 2 minutes forward and 2 minutes backwards for muscle extensibility  - subjective obtained during this time intermittently, 1 minute not billed    Rhythmic stabilization at 100 deg flexion; 1 x 3 minutes with multidirectional perturbations in static position    Sidelying ER; 2x10, with 3-lb Dbell  -verbal and tactile cueing for scap retraction   Standing ER with Red Tband, towel roll under arm; 2x10    Standing scaption with Red Tband, one end anchored under foot; 2x10   -verbal cueing and demo for upright posture and avoidance of scapular hiking compensation  Standing Tband row; Blue Tband; 2x10     PATIENT EDUCATION: HEP update and review to include additional RTC isotonics at home.     *next visit* Serratus slide, at wall on foam roller; 2x8, 5 sec hold at top; with yellow Tband    *not today* Wall slide, abduction; x10 with 5 sec hold Shoulder wand flexion with 5-lb cuff weight for gravity-assisted stretch into end-range flexion; 2x10 Standing IR step-outs for isometric contraction with Red Tband; 2x10 (3 lateral steps with tension)  - min verbal cueing for technique Sidelying shoulder abduction; 1x10, ROM as tolerated  Wand ER in standing, 90 deg abduction with arm propped on railing; 1x10, 3 sec Wall slide, flexion and abduction; x10 ea dir, 5 sec  hold Sidelying ER; 2x10, 3-lb Dbell -verbal and tactile cueing for technique Standing Tband row, Blue Tband; 2x12 Finger ladder, x10, 5 sec hold at top for forward flexion  Supine flexion AROM; 2x10 Pulleys; shoulder complex abduction ; x 2 minutes Wand ER AAROM; 2x10 Table slide PROM Flexion x15 Table slide PROM Abd x15 Gripping; pink Theraputty; composite fist, performed x 2 minutes Wrist AROM; flexion/extension, RD/UD, pronation/supination; reviewed for HEP  Cold pack (unbilled) - for anti-inflammatory and analgesic effect as needed for reduced pain and improved ability to participate in active PT intervention,  along R shoulder in sitting, pillow propped under R forearm for upper limb support, x 5 minutes    PATIENT EDUCATION:  Education details: see above for patient education details Person educated: Patient Education method: Explanation Education comprehension: verbalized understanding     HOME EXERCISE PROGRAM: Access CodeHC:[email or web address] URL: [email or web address] Date: 01/18/2023 Prepared by: Valentina Gu  Exercises - Supine Shoulder Flexion Extension AAROM with Dowel  - 2 x daily - 7 x weekly - 2 sets - 10 reps - Supine Shoulder External Rotation in 45 Degrees Abduction AAROM with Dowel  - 2 x daily - 7 x weekly - 2 sets - 10 reps - Seated Shoulder Flexion AAROM with Pulley Behind  - 2 x daily - 7 x weekly - 2 sets - 10 reps - Shoulder Flexion Wall Slide with Towel  - 2 x daily - 7 x weekly - 2 sets - 10 reps - 5sec hold - Shoulder External Rotation with Anchored Resistance  - 1 x daily - 4 x weekly - 2 sets - 10 reps - Standing Shoulder Row with Anchored Resistance  - 1 x daily - 4 x weekly - 2 sets - 10 reps - Scaption with Resistance  - 1 x daily - 4 x weekly - 2 sets - 10 reps     ASSESSMENT:   CLINICAL IMPRESSION: Patient demonstrates normalizing R shoulder ROM with only mild deficit in shoulder complex abduction. Pt does have some remaining difficulty with reaching overhead that can be associated with overlay of cervicalgia and C-spine referred pain as well as remaining RTC weakness. We updated HEP to include RTC strengthening drills. Pt needs further restoration of strength and function of R upper limb. Pt may need intervention for C-spine prior to best recovery of function; he is continuing follow-up with spine specialist/neurosurgery. Patient has ongoing post-operative impairments in R shoulder AROM and PROM, post-operative pain, decreased strength, R shoulder/arm pain. Patient will benefit from continued skilled PT to address above impairments and improve overall  function.   REHAB POTENTIAL: Good   CLINICAL DECISION MAKING: Stable/uncomplicated   EVALUATION COMPLEXITY: Low     GOALS: Goals reviewed with patient? Yes   SHORT TERM GOALS: Target date: 09/14/2022   Pt will be independent with HEP to minimize adverse effects of immobilization and gently mobilize R shoulder to improve pain-free function at home and work. Baseline: 08/17/22: Baseline HEP initiated.   11/17/22:  Pt is compliant with HEP and verbalizes understanding of given exercises Goal status: ACHIEVED     LONG TERM GOALS: Target date: 12/07/22   Pt will increase FOTO to at least 56 to demonstrate significant improvement in function at home and work related to neck pain  Baseline: 08/17/22: 4.   11/17/22: 61/56 Goal status: ACHIEVED   2.  Pt will decrease worst shoulder pain by at least 3 points on the NPRS in order to demonstrate clinically significant reduction in shoulder pain.  Baseline: 08/17/22: 5-6/10 at worst over last week.   09/23/22: 2/10.   11/17/22: 2-3/10 at worst.    12/08/22: 2/10.  12/23/22: 2-3/10  Goal status: IN PROGRESS    3.   Pt will have R shoulder AROM at least within 10 degrees of contralateral upper extremity indicative of improved ROM as needed for reaching, overhead work, self-care activities/dressing/grooming Baseline: 08/17/22: NO AROM presently, severely limited PROM in early post-op phase.  09/23/22: AROM not appropriate yet at this point post-operatively   11/17/22: Motion loss remains in all planes of motion.  12/08/22: met for flexion and IR, not met for ER and ABD.  Goal status: IN PROGRESS   4.   Pt will have R shoulder MMTs at 4+/5 or greater for all directions performed indicative of improved strength as needed for completion of daily lifting, reaching, carrying tasks (e.g. 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  09/23/22: still deferred at this point  post-operatively.   11/17/22: MMT for R shoulder 3+ to 4 (see chart above).    12/08/22: Not met for flexion, ABD, ER.  Goal status: ON-GOING   5.   Pt will perform unilateral farmer's carry with 15 lbs for 100 ft without reproduction of pain simulating carrying items in [employer] including carrying trash bag  Baseline: 08/17/22: Unable to perform lifting/carrying.  09/23/22: unable.   11/17/22: Deferred to later date.    12/08/22: Deferred due to remaining weakness.  Goal status: DEFERRED     PLAN: PT FREQUENCY: 2x/week   PT DURATION: 6 weeks   PLANNED INTERVENTIONS: Therapeutic exercises, Therapeutic activity, Neuromuscular re-education, Balance training, Gait training, Patient/Family education,  Joint mobilization, Electrical stimulation, Cryotherapy, Moist heat, and Manual therapy   PLAN FOR NEXT SESSION: Progress to full R shoulder ROM as tolerated. Continue with R shoulder AA/AROM and gradually progress with RTC isotonics as tolerated. Progress with strengthening as AROM is restored; increased emphasis on strengthening with successive visits.     Valentina Gu, PT, DPT [id number]  Eilleen Kempf, PT 01/18/2023, 8:35 AM

## 2023-01-15 NOTE — Telephone Encounter (Signed)
[facility] notes have been scanned into chart. 1 ESI on 10/28/2022, currently in PT at [facility] for his shoulder. No PT specifically for his neck. But his physical therapist is aware of all his neck issues.

## 2023-01-15 NOTE — Telephone Encounter (Signed)
They agreed and will see Stacy on Monday.

## 2023-01-15 NOTE — Progress Notes (Unsigned)
Referring Physician:  No referring provider defined for this encounter.  Primary Physician:  Lynnell Jude, MD  History of Present Illness: 01/18/2023 [name] has a history of BPH and DM.   He is s/p right shoulder surgery in October with Dr. Mack Guise. He was seeing Dr. Kayleen Memos and had cervical ESI. ACDF C5-C6 discussed and he was to get HgbA1c below 7.5. No further ESIs recommended as BS went to 560 with injection per patient.   He has constant neck pain that radiates into right hand to thump and sometimes to his right small finger. He has numbness, tingling, and weakness in right hand. Pain is worse with prolonged sitting and moving his neck. Some relief with motrin and OTC biofreeze.   His last HgbA1c was 10.5 on 11/04/22.   Bowel/Bladder Dysfunction: none  Conservative measures:  Physical therapy: for shoulder, none for cervical spine  Multimodal medical therapy including regular antiinflammatories: flexeril  Injections:  C7-T1 IL ESI on 10/28/22 at [facility] with 60% improvement (blood sugar went to 560)- ESI helped for 2 weeks.   Past Surgery:  Right shoulder scope 08/06/22 Shawn Atkins has no symptoms of cervical myelopathy.  The symptoms are causing a significant impact on the patient's life.   Review of Systems:  A 10 point review of systems is negative, except for the pertinent positives and negatives detailed in the HPI.  Past Medical History: Past Medical History:  Diagnosis Date   BPH (benign prostatic hypertrophy)    Bronchitis    Chronic prostatitis    Complication of anesthesia    Felt like "couldn't breathe" after triple Hernia surgery   Diabetes mellitus without complication (HCC)    Flank pain    GERD (gastroesophageal reflux disease)    h/o   Gross hematuria    History of kidney stones    HTN (hypertension)    pt states he takes lisinopril for kidney protection due to dm not htn   Inguinal hernia    Nocturia    OSA on CPAP     with 02 2 L   Overweight    PONV (postoperative nausea and vomiting)    only during kidney stone surgery   Spermatocele    Stricture of urethra    Vertigo    1 episode, 6-7 yrs ago    Past Surgical History: Past Surgical History:  Procedure Laterality Date   BICEPT TENODESIS Right 08/06/2022   Procedure: BICEPS TENODESIS;  Surgeon: [name], MD;  Location: [facility];  Service: Orthopedics;  Laterality: Right;   CATARACT EXTRACTION W/PHACO Left 07/22/2020   Procedure: CATARACT EXTRACTION PHACO AND INTRAOCULAR LENS PLACEMENT ([facility]) LEFT DIABETIC 4.14  00:33.0;  Surgeon: Eulogio Bear, MD;  Location: [place];  Service: Ophthalmology;  Laterality: Left;  Diabetic - oral meds   CATARACT EXTRACTION W/PHACO Right 08/12/2020   Procedure: CATARACT EXTRACTION PHACO AND INTRAOCULAR LENS PLACEMENT ([place]) RIGHT DIABETIC;  Surgeon: Eulogio Bear, MD;  Location: [place];  Service: Ophthalmology;  Laterality: Right;  1.99 0:26.2   COLONOSCOPY WITH PROPOFOL N/A 11/20/2015   Procedure: COLONOSCOPY WITH PROPOFOL;  Surgeon: Christene Lye, MD;  Location: [facility];  Service: Endoscopy;  Laterality: N/A;   HERNIA REPAIR Bilateral [id number]   umbilical and bil inguinal/ Dr Marina Gravel   KIDNEY STONE SURGERY     KNEE ARTHROSCOPY Left    open lithotomy     SHOULDER ARTHROSCOPY WITH OPEN ROTATOR CUFF REPAIR AND DISTAL CLAVICLE ACROMINECTOMY  Right 08/06/2022   Procedure: SHOULDER ARTHROSCOPY WITH OPEN ROTATOR CUFF REPAIR AND DISTAL CLAVICLE ACROMINECTOMY;  Surgeon: [name], MD;  Location: [facility];  Service: Orthopedics;  Laterality: Right;    Allergies: Allergies as of 01/18/2023 - Review Complete 01/18/2023  Allergen Reaction Noted   Penicillins Anaphylaxis 06/14/2015   Morphine and related Nausea And Vomiting 06/14/2015    Medications: Outpatient Encounter Medications as of 01/18/2023  Medication Sig   albuterol (PROVENTIL) (2.5 MG/3ML) 0.083%  nebulizer solution Take 2.5 mg by nebulization every 6 (six) hours as needed for wheezing or shortness of breath (Last used in the winter of 2022).   atorvastatin (LIPITOR) 20 MG tablet Take 20 mg by mouth every evening.   BIOTIN PO Take 1 tablet by mouth daily.   glucosamine-chondroitin 500-400 MG tablet Take 1 tablet by mouth 2 (two) times daily.   lisinopril (ZESTRIL) 10 MG tablet Take 10 mg by mouth every morning.   metFORMIN (GLUCOPHAGE-XR) 500 MG 24 hr tablet Take 1,000 mg by mouth 2 (two) times daily with a meal.   Multiple Vitamin (MULTIVITAMIN) tablet Take 1 tablet by mouth daily.   NEEDLE, DISP, 18 G (BD DISP NEEDLES) 18G X 1-1/2" MISC 1 mg by Does not apply route every 14 (fourteen) days.   NEEDLE, DISP, 21 G (BD DISP NEEDLES) 21G X 1-1/2" MISC 1 mg by Does not apply route every 14 (fourteen) days.   pioglitazone (ACTOS) 15 MG tablet Take 15 mg by mouth daily.   Syringe, Disposable, (2-3CC SYRINGE) 3 ML MISC 1 mg by Does not apply route every 14 (fourteen) days.   tadalafil (CIALIS) 5 MG tablet Take 1 tablet (5 mg total) by mouth every morning. BPH   testosterone cypionate (DEPOTESTOSTERONE CYPIONATE) 200 MG/ML injection INJECT 1 ML INTO MUSCLE EVERY 14 DAYS   No facility-administered encounter medications on file as of 01/18/2023.    Social History: Social History   Tobacco Use   Smoking status: Former    Packs/day: 1.00    Years: 15.00    Additional pack years: 0.00    Total pack years: 15.00    Types: Cigarettes    Quit date: 1985    Years since quitting: 39.2    Passive exposure: Past   Smokeless tobacco: Current    Types: Snuff   Tobacco comments:    Smokless and snuff  Vaping Use   Vaping Use: Never used  Substance Use Topics   Alcohol use: Yes    Alcohol/week: 1.0 standard drink of alcohol    Types: 1 Cans of beer per week    Comment: 3 beers daily   Drug use: No    Family Medical History: Family History  Problem Relation Age of Onset   Benign prostatic  hyperplasia Father    Kidney disease Neg Hx    Prostate cancer Neg Hx     Physical Examination: Vitals:   01/18/23 1406  BP: 122/60  Pulse: 95  SpO2: 95%    General: Patient is well developed, well nourished, calm, collected, and in no apparent distress. Attention to examination is appropriate.  Respiratory: Patient is breathing without any difficulty.   NEUROLOGICAL:     Awake, alert, oriented to person, place, and time.  Speech is clear and fluent. Fund of knowledge is appropriate.   Cranial Nerves: Pupils equal round and reactive to light.  Facial tone is symmetric.    He has mild posterior cervical tenderness. Mild tenderness in bilateral trapezial region.   No abnormal  lesions on exposed skin.   Strength: Side Biceps Triceps Deltoid Interossei Grip Wrist Ext. Wrist Flex.  R 5 5 5 5 5 5 5   L 5 5 5 5 5 5 5    Side Iliopsoas Quads Hamstring PF DF EHL  R 5 5 5 5 5 5   L 5 5 5 5 5 5    Reflexes are 2+ and symmetric at the biceps, triceps, brachioradialis, patella and achilles.   Hoffman's is absent.  Clonus is not present.   Bilateral upper and lower extremity sensation is intact to light touch.     Gait is normal.      Medical Decision Making  Imaging: Cervical MRI dated 06/16/22:  Conclusion: No cord compression, cord edema, or syrinx. Multilevel degenerative disc disease with severe involvement at C5-C6 through C7-T1 levels.  In reference to right sided symptoms, foraminal stenosis is greatest at C3-C4 and C7-T1 level with moderate involvement. Eccentric rightward disc protrusion contours the anterior surface right hemicord at C5-C6.   See report scanned under media.   I have personally reviewed the images and agree with the above interpretation.  Cervical xrays dated 05/21/22:  Cervical spondylosis. Moderate DDD C5-C7.   No radiology report available for above xrays.   Assessment and Plan: [name] is a pleasant 67 y.o. male has constant neck pain that  radiates into right hand to thumb and sometimes to his right small finger. He has numbness, tingling, and weakness in right hand.  As above, he had right shoulder scope 08/06/22 with Dr. Mack Guise.   He has known cervical spondylosis and DDD C5-C7 with central disc at foraminal stenosis on right at C5-T1 as well.   Had improvement with cervical ESI, but blood sugar went to 560.   Treatment options discussed with patient and following plan made:   - Order for physical therapy for cervical spine to [facility] in [place].  - I agree with no further cervical injections.  - Last HgbA1c on 11/04/22 was 10.5. This would need to be below 7.5 prior to consideration of surgery. He will discuss with his PCP.  - He is currently out of work due to his shoulder.  - He will have PCP recheck his HgbA1c after 02/03/23 and let us know results.  - Follow up made for him to see Dr. Izora Ribas in 6-8 weeks- if no improvement with above, may need to consider surgery options. Will need to see what HgbA1c is prior to this visit. May need to push back.    I spent a total of 40 minutes in face-to-face and non-face-to-face activities related to this patient's care today including review of outside records, review of imaging, review of symptoms, physical exam, discussion of differential diagnosis, discussion of treatment options, and documentation.   Thank you for involving me in the care of this patient.   Geronimo Boot PA-C Dept. of Neurosurgery

## 2023-01-18 ENCOUNTER — Ambulatory Visit: Payer: Medicare Other | Admitting: Orthopedic Surgery

## 2023-01-18 ENCOUNTER — Encounter: Payer: Self-pay | Admitting: Orthopedic Surgery

## 2023-01-18 VITALS — BP 122/60 | HR 95 | Ht 69.0 in | Wt 202.0 lb

## 2023-01-18 DIAGNOSIS — M4722 Other spondylosis with radiculopathy, cervical region: Secondary | ICD-10-CM

## 2023-01-18 DIAGNOSIS — M5412 Radiculopathy, cervical region: Secondary | ICD-10-CM

## 2023-01-18 DIAGNOSIS — M47812 Spondylosis without myelopathy or radiculopathy, cervical region: Secondary | ICD-10-CM

## 2023-01-18 NOTE — Patient Instructions (Signed)
It was so nice to see you today. Thank you so much for coming in.    You have some wear and tear (arthritis) in your neck. This is worse at C5-C6 and C6-C7.   I sent physical therapy orders to [facility] in [place]. You can call them at [phone] if you don't hear from them to schedule your visit.   I do not recommend any further neck injections.   Work on getting your blood sugars under good control. You will need your HgbA1c checked after 02/03/23. Recommend that you have your PCP do this and you message or call us with results.   HgbA1c would need to be 7.5 or less to consider surgery.   Dr. Izora Ribas will see you back in 6-8 weeks, but we will need to know your HgbA1c prior to this visit.   Please do not hesitate to call if you have any questions or concerns. You can also message me in [place].   Geronimo Boot PA-C [phone]

## 2023-01-19 ENCOUNTER — Ambulatory Visit: Payer: Medicare Other | Admitting: Physical Therapy

## 2023-01-19 DIAGNOSIS — M6281 Muscle weakness (generalized): Secondary | ICD-10-CM

## 2023-01-19 DIAGNOSIS — M25511 Pain in right shoulder: Secondary | ICD-10-CM

## 2023-01-19 DIAGNOSIS — M542 Cervicalgia: Secondary | ICD-10-CM

## 2023-01-19 DIAGNOSIS — M25611 Stiffness of right shoulder, not elsewhere classified: Secondary | ICD-10-CM

## 2023-01-19 NOTE — Therapy (Signed)
OUTPATIENT PHYSICAL THERAPY TREATMENT AND RE-ASSESSMENT FOR CERVICAL SPINE    Patient Name: Shawn Atkins MRN: [id number] DOB:1956-06-11, 67 y.o., male Today's Date: 01/19/2023  END OF SESSION:   PT End of Session - 01/20/23 1505     Visit Number 36    Number of Visits 42    Date for PT Re-Evaluation 01/14/23    Authorization Type UHC Medicare, VL based on medical necessity    Progress Note Due on Visit 40    PT Start Time 0815    PT Stop Time 0905    PT Time Calculation (min) 50 min    Activity Tolerance Patient limited by pain;Patient tolerated treatment well    Behavior During Therapy WFL for tasks assessed/performed              Past Medical History:  Diagnosis Date   BPH (benign prostatic hypertrophy)    Bronchitis    Chronic prostatitis    Complication of anesthesia    Felt like "couldn't breathe" after triple Hernia surgery   Diabetes mellitus without complication (HCC)    Flank pain    GERD (gastroesophageal reflux disease)    h/o   Gross hematuria    History of kidney stones    HTN (hypertension)    pt states he takes lisinopril for kidney protection due to dm not htn   Inguinal hernia    Nocturia    OSA on CPAP    with 02 2 L   Overweight    PONV (postoperative nausea and vomiting)    only during kidney stone surgery   Spermatocele    Stricture of urethra    Vertigo    1 episode, 6-7 yrs ago   Past Surgical History:  Procedure Laterality Date   BICEPT TENODESIS Right 08/06/2022   Procedure: BICEPS TENODESIS;  Surgeon: [name], MD;  Location: [facility];  Service: Orthopedics;  Laterality: Right;   CATARACT EXTRACTION W/PHACO Left 07/22/2020   Procedure: CATARACT EXTRACTION PHACO AND INTRAOCULAR LENS PLACEMENT ([facility]) LEFT DIABETIC 4.14  00:33.0;  Surgeon: Eulogio Bear, MD;  Location: [place];  Service: Ophthalmology;  Laterality: Left;  Diabetic - oral meds   CATARACT EXTRACTION W/PHACO Right 08/12/2020   Procedure:  CATARACT EXTRACTION PHACO AND INTRAOCULAR LENS PLACEMENT ([place]) RIGHT DIABETIC;  Surgeon: Eulogio Bear, MD;  Location: [place];  Service: Ophthalmology;  Laterality: Right;  1.99 0:26.2   COLONOSCOPY WITH PROPOFOL N/A 11/20/2015   Procedure: COLONOSCOPY WITH PROPOFOL;  Surgeon: Christene Lye, MD;  Location: [facility];  Service: Endoscopy;  Laterality: N/A;   HERNIA REPAIR Bilateral [id number]   umbilical and bil inguinal/ Dr Marina Gravel   KIDNEY STONE SURGERY     KNEE ARTHROSCOPY Left    open lithotomy     SHOULDER ARTHROSCOPY WITH OPEN ROTATOR CUFF REPAIR AND DISTAL CLAVICLE ACROMINECTOMY Right 08/06/2022   Procedure: SHOULDER ARTHROSCOPY WITH OPEN ROTATOR CUFF REPAIR AND DISTAL CLAVICLE ACROMINECTOMY;  Surgeon: [name], MD;  Location: [facility];  Service: Orthopedics;  Laterality: Right;   Patient Active Problem List   Diagnosis Date Noted   History of rectal bleeding 06/01/2018   Left groin pain 03/18/2018   Sacroiliac joint pain 01/13/2017   Abdominal pain, left lower quadrant 08/23/2015   BPH with obstruction/lower urinary tract symptoms 08/23/2015   Biceps tendinitis 06/25/2015     PCP: Lynnell Jude, MD REFERRING PROVIDER: [name], MD     Geronimo Boot, PA-C  REFERRING DIAG: [phone] Unspecified rotator  cuff tear or rupture of right shoulder, not specified as traumatic M47.812 (ICD-10-CM) - Cervical spondylosis  M54.12 (ICD-10-CM) - Cervical radiculopathy    THERAPY DIAG:  Acute pain of right shoulder  Cervicalgia  Stiffness of right shoulder, not elsewhere classified  Muscle weakness (generalized)  Rationale for Evaluation and Treatment Rehabilitation   PERTINENT HISTORY: Pt is a 67 year old male s/p R shoulder rotator cuff repair and distal clavicle acromionectomy with Dr. Mack Guise on 08/06/22. Patient reports that his surgeon informed him he did have full tear of rotator cuff in surgery. Patient reports 4 days of severe pain just out  of surgery. Patient reports managing pain well with Tylenol/Ibuprofen well now. Pt followed up with [facility] for erythema and increased temperature as well as notable edema; ruled out infection and DVT. Patient reports comorbid neck and R upper trap pain. Pt reports difficulty with sleep positioning due to comorbid neck pain.    Pain:  Pain Intensity: Present: 2-3/10, Best: 2/10, Worst: 5-6/10 Pain location: R shoulder along deltoid and biceps Pain Quality: steady pain, intense intermittently; sharp pain along ACJ that comes and goes Radiating: Yes , to anterior arm Numbness/Tingling: No Aggravating factors: moving his R arm Relieving factors: Ibuprofen, Tylenol, resting arm/arm propped on armrest  History of prior shoulder or neck/shoulder injury, pain, surgery, or therapy: Yes, L shoulder with hx of cortisone injections, Hx of neck injury/PT for his neck Falls: Has patient fallen in last 6 months? No Dominant hand: left Imaging: Yes , MRI pre-operatively Prior level of function: Independent Occupational demands: Pt works part-time with [employer] (20 hrs/week), pt out of work now on medical leave Hobbies: [employer] (personal history of cancer, chills/fever, night sweats, nausea, vomiting, unrelenting pain): Negative   Precautions: None   Weight Bearing Restrictions: No   Living Environment Lives with: lives with their spouse Lives in: House/apartment     Patient Goals: Able to pick up weight with his arm, fish; able to play with baseball/bat with his grandson    PRECAUTIONS: No R shoulder AROM, no RUE lifting    DOS: 08/06/22, s/p R large RTC repair, R distal acromionectomy -8 weeks post-op 10/01/22 -12 weeks post-op 10/29/22    SUBJECTIVE:                                                                                                                                                                                      SUBJECTIVE STATEMENT:  Patient has hx of cervical  spine pain that has limited RTC repair rehab. He has new referral for cervicalgia. He reports some localized pain in top of shoulder with AROM of R arm. He has continuous soreness  affecting R shoulder. Patient reports constant pain affecting R paracervical region. Patient reports numbness down to R thumb and R styloid process region. Patient reports he was able to carry [employer] to his porch with neighbor at abdomen level. MR findings of Multilevel degenerative disc disease with severe involvement at C5-C6 through C7-T1 levels. foraminal stenosis is greatest at C3-C4 and C7-T1 level with moderate involvement. Eccentric rightward disc protrusion contours the anterior surface right hemicord at C5-C6. No cord compression, cord edema, or syrinx. Aggravated by: lying down/trying to sleep, prolonged sitting, pain with cervical rotation in either direction (sometimes worse with R rotation), bending over and lifting off of floor. Relieved by: roll up towel along cervical spine, lidocaine patches, stretching traps. Pt reports disturbed sleep - waking up 3 times last night. He reports no notable relief with change in position. Pt denies gait changes or LE weakness. He reports some weakness in R hand and limited wrist extension strength with his assessment yesterday with Geronimo Boot, PA-C.    PAIN:  Are you having pain? No Pain location: pain into R paracervical/upper trap region mainly     OBJECTIVE: (objective measures completed at initial evaluation unless otherwise dated)     Patient Surveys  FOTO: 4, predicted outcome score of 56     Cognition Patient is oriented to person, place, and time.  Recent memory is intact.  Remote memory is intact.  Attention span and concentration are intact.  Expressive speech is intact.  Patient's fund of knowledge is within normal limits for educational level.                          Gross Musculoskeletal Assessment Tremor: None Bulk: Atrophy of  deltoid/posterior cuff mm Tone: Normal   Observation No sign of acute infection, mild erythema along R upper arm, mild increased tissue temperature along R middle deltoid     Posture FHRS posture     AROM               AROM (Normal range in degrees) AROM 08/17/2022 AROM 11/17/22 AROM 12/08/22 AROM 12/23/22 AROM 01/19/23    Right Left Right Left Right Left Right Left Right Left  Shoulder              Flexion Deferred   125 155 141 155 143 157 142 161  Extension Deferred            Abduction Deferred   128 165 147 165 155 WNL 155 WNL  External Rotation Deferred   80 WNL 87  WNL 86 WNL 90 WNL  Internal Rotation Deferred   50 WNL 70 WNL 57 WNL WNL WNL  Hands Behind Head Deferred   C7 T3 T2 T4 T2 T5 T3 T5  Hands Behind Back Deferred   L3 T12 L2 T12 L4 T7 T12 T10                 Elbow              Flexion WNL            Extension WNL            Pronation 50            Supination 60            (* = pain; Blank rows = not tested)     PROM         PROM (Normal range in  degrees) PROM 08/17/2022 PROM 11/17/22 PROM 12/08/22    Right Left Right Left Right Left  Shoulder          Flexion 40    160 157 166  Extension          Abduction     108 WNL 111   External Rotation (arm at side) To neutral/0 deg    WNL ER at 90 deg ABD: 60 deg   Internal Rotation (arm at side) 45    WNL IR at 90 degr ABD: 70 deg         UE MMT:  MMT (out of 5) Right 11/17/22 Left 11/17/22 Right 12/08/22 Right  12/23/22 Right 01/19/23 Left 01/19/23             Shoulder       Flexion 3+  5  4- 4 4 5   Extension          Abduction 4-   4+ 4- 4 4* 4+  External rotation  4-  4+ 4 4 4* 4+  Internal rotation  4 5  4+ 5- 5 5  Horizontal abduction          Horizontal adduction          Lower Trapezius          Rhomboids                     Elbow      Flexion  4+ 5 5 5 5 5   Extension  4+  5 5 5  4+ 5  Pronation          Supination                     Wrist      Flexion       5 5  Extension       4 5   Radial deviation          Ulnar deviation          Finger ABD     WNL WNL  (* = pain; Blank rows = not tested)      Palpation   Location LEFT  RIGHT           Subocciptials      Cervical paraspinals      Upper Trapezius      Levator Scapulae      Rhomboid Major/Minor      Sternoclavicular joint      Acromioclavicular joint   2  Coracoid process   1  Long head of biceps   1  Supraspinatus   2  Infraspinatus   1  Subscapularis      Teres Minor      Teres Major      Pectoralis Major      Pectoralis Minor      Anterior Deltoid   1  Lateral Deltoid   2  Posterior Deltoid      Latissimus Dorsi      Sternocleidomastoid      (Blank rows = not tested) Graded on 0-4 scale (0 = no pain, 1 = pain, 2 = pain with wincing/grimacing/flinching, 3 = pain with withdrawal, 4 = unwilling to allow palpation), (Blank rows = not tested)    CERVICAL SPINE ASSESSMENT 01/19/23  AROM AROM (Normal range in degrees) AROM 01/19/23  Cervical  Flexion (50) 40  Extension (80) 32*  Right lateral flexion (45) 31 (pull on L)  Left lateral flexion (45) 20 (pull on R)  Right rotation (85) 51*  Left rotation (85) 40*  (* = pain; Blank rows = not tested)    Sensation Grossly intact to light touch bilateral UE as determined by testing dermatomes C2-T2. Proprioception and hot/cold testing deferred on this date.  Reflexes R/L Elbow: 2+/2+  Brachioradialis: 2+/2+  Tricep: 1+/1+  Repeated Movements Repeated cervical retraction in supine: pressure in mid-lower C-spine during, no worse after     SPECIAL TESTS Spurlings A (ipsilateral lateral flexion/axial compression): R: Positive L: Negative Distraction Test: Positive  Hoffman Sign (cervical cord compression): R: Negative L: Negative ULTT Median: R: Negative L: Negative    Location LEFT  RIGHT           Suboccipitals 0 0  Cervical paraspinals 0 2 (referred to R shoulder)  Upper Trapezius 0 2  Levator Scapulae 0 1  Rhomboid Major/Minor     (Blank rows = not tested) Graded on 0-4 scale (0 = no pain, 1 = pain, 2 = pain with wincing/grimacing/flinching, 3 = pain with withdrawal, 4 = unwilling to allow palpation), (Blank rows = not tested)     TODAY'S TREATMENT 01/19/2023   PT Re-evaluation to integrate intervention for cervical spine and completion of C-spine assessment along with ROM and MMT check for upper limbs (see information above gathered 01/19/23)    Manual Therapy - for R shoulder ROM and to prevent R shoulder stiffness; manual perturbations for re-training of stabilizers of GHJ   Manual general cervical traction;10 sec on, 10 sec off; x 3 minutes with pt in supine   *not today* R shoulder PROM flexion, abduction, ER/IR within pt tolerance with gentle overpressure as able x 12 minutes DTM/TPR bilat  UT; x 3 minutes -applied Biofreeze to R paracervical musculature and R upper trapezius for analgesic effect Gentle STM to R anterior and middle deltoid, biceps, upper trapezius; x 2 minutes DTM/Tpr R upper trapezius; x 8 minutes   Therapeutic Exercise - for shoulder ROM as needed for reaching and grasping tasks, self-care ADLs, and household chores   UE ergometer 2 minutes forward and 2 minutes backwards for muscle extensibility  - subjective obtained during this time intermittently, 1 minute not billed     PATIENT EDUCATION: HEP update for cervical spine. Discussed continued work on shoulder ROM and RTC strengthening exercises as tolerated without significant exacerbation of paracervical symptoms.     *next visit* Serratus slide, at wall on foam roller; 2x8, 5 sec hold at top; with yellow Tband    *not today*  Rhythmic stabilization at 100 deg flexion; 1 x 3 minutes with multidirectional perturbations in static position   Sidelying ER; 2x10, with 3-lb Dbell  -verbal and tactile cueing for scap retraction  Standing ER with Red Tband, towel roll under arm; 2x10  Standing scaption with Red Tband, one end  anchored under foot; 2x10   -verbal cueing and demo for upright posture and avoidance of scapular hiking compensation Standing Tband row; Blue Tband; 2x10  Wall slide, abduction; x10 with 5 sec hold Shoulder wand flexion with 5-lb cuff weight for gravity-assisted stretch into end-range flexion; 2x10 Standing IR step-outs for isometric contraction with Red Tband; 2x10 (3 lateral steps with tension)  - min verbal cueing for technique Sidelying shoulder abduction; 1x10, ROM as tolerated  Wand ER in standing, 90 deg abduction with arm propped on railing; 1x10, 3 sec Wall slide, flexion and abduction; x10 ea dir, 5 sec hold Sidelying ER; 2x10, 3-lb  Dbell -verbal and tactile cueing for technique Standing Tband row, Blue Tband; 2x12 Finger ladder, x10, 5 sec hold at top for forward flexion  Supine flexion AROM; 2x10 Pulleys; shoulder complex abduction ; x 2 minutes Wand ER AAROM; 2x10 Table slide PROM Flexion x15 Table slide PROM Abd x15 Gripping; pink Theraputty; composite fist, performed x 2 minutes Wrist AROM; flexion/extension, RD/UD, pronation/supination; reviewed for HEP  Cold pack (unbilled) - for anti-inflammatory and analgesic effect as needed for reduced pain and improved ability to participate in active PT intervention, along R shoulder in sitting, pillow propped under R forearm for upper limb support, x 5 minutes    PATIENT EDUCATION:  Education details: see above for patient education details Person educated: Patient Education method: Explanation Education comprehension: verbalized understanding     HOME EXERCISE PROGRAM: SHOULDER HEP Access Code: [email or web address]  CERVICAL SPINE HEP Access Code: [email or web address] URL: [email or web address] Date: 01/20/2023 Prepared by: Valentina Gu  Exercises - Supine Chin Tuck  - 5-6 x daily - 7 x weekly - 1 sets - 10 reps - 1sec hold - Seated Self Cervical Traction  - 2 x daily - 7 x weekly - 10-20 reps - 10sec hold - Supine  Cervical Sidebending Stretch  - 2 x daily - 7 x weekly - 3 sets - 30sec hold      ASSESSMENT:   CLINICAL IMPRESSION: Patient has made good progress with restoration of R shoulder ROM with passive flexion, scaption, ER, and IR approaching WNL (though pt has some stiffness toward end-ROM). He has active forward elevation motion deficit remaining for R shoulder. He has fair strength for flexion, abduction, and ER. Pt does still have pain with loading into abduction and ER. Pt has overlay of R upper quarter pain associated with cervical radiculopathy and associated referred pain from C-spine. Pt has new referral for cervicalgia; we completed assessment for cervical spine today along with update for shoulder ROM/MMT. Pt is appropriate for integrating targeted therapist for his neck given good progress to date on R shoulder motion. He does still need work on RTC strengthening and recovery of function for lifting/carrying/overhead work. Pt will likely be able to perform functional activities with upper limb better following treatment for cervical spine. 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 We initiated repeated movement program today for C-spine and discussed activity limitation and use of cervical roll to manage pain at this time. Pt has current impairments in: R shoulder AROM, deltoid and RTC/periscapular strength, R-sided neck pain with R upper limb referral/R forearm/hand paresthesias, R shoulder stiffness, postural changes. Patient will benefit from continued skilled PT to address above impairments and improve overall function.   REHAB POTENTIAL: Good   CLINICAL DECISION MAKING: Stable/uncomplicated   EVALUATION COMPLEXITY: Low     GOALS: Goals reviewed with patient? Yes   SHORT TERM GOALS: Target date: 09/14/2022   Pt will be independent with HEP to  minimize adverse effects of immobilization and gently mobilize R shoulder to improve pain-free function at home and work. Baseline: 08/17/22: Baseline HEP initiated.   11/17/22:  Pt is compliant with HEP and verbalizes understanding of given exercises Goal status: ACHIEVED     LONG TERM GOALS: Target date: 12/07/22   Pt will increase FOTO (for shoulder) to at least 56 to demonstrate significant improvement in function at home and work related to neck pain  Baseline: 08/17/22: 4.  11/17/22: 61/56 Goal status: ACHIEVED   2.  Pt will decrease worst shoulder pain by at least 3 points on the NPRS in order to demonstrate clinically significant reduction in shoulder pain. Baseline: 08/17/22: 5-6/10 at worst over last week.   09/23/22: 2/10.   11/17/22: 2-3/10 at worst.    12/08/22: 2/10.    12/23/22: 2-3/10  Goal status: IN PROGRESS    3.   Pt will have R shoulder AROM at least within 10 degrees of contralateral upper extremity indicative of improved ROM as needed for reaching, overhead work, self-care activities/dressing/grooming Baseline: 08/17/22: NO AROM presently, severely limited PROM in early post-op phase.  09/23/22: AROM not appropriate yet at this point post-operatively   11/17/22: Motion loss remains in all planes of motion.  12/08/22: met for flexion and IR, not met for ER and ABD.    01/19/23: Met for all except flexion AROM tested in standing.  Goal status: MOSTLY MET    4.   Pt will have R shoulder MMTs at 4+/5 or greater for all directions performed indicative of improved strength as needed for completion of daily lifting, reaching, carrying tasks (e.g. 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  09/23/22: still deferred at this point post-operatively.   11/17/22: MMT for R shoulder 3+ to 4 (see chart above).    12/08/22: Not met for flexion, ABD, ER.    01/19/23: Not met for flexion, ABD, ER.  Goal status: ON-GOING    5.   Pt will perform unilateral farmer's carry with 15 lbs for 100 ft without reproduction of pain simulating carrying items in [employer] including carrying trash bag  Baseline: 08/17/22: Unable to perform lifting/carrying.  09/23/22: unable.   11/17/22: Deferred to later date.    12/08/22: Deferred due to remaining weakness.  Goal status: DEFERRED   6.  Pt will decrease worst neck pain by at least 2 points on the NPRS in order to demonstrate clinically significant reduction in neck pain. Baseline: 01/19/23: Pain 8/10 at worst Goal status: INITIAL  7. Pt will increase FOTO (for neck) to at least 56 to demonstrate significant improvement in function at home and work related to neck pain  Baseline: 01/18/23: FOTO for neck created, intake survey to be completed next visit.   Goal status: INITIAL  7. Pt will improve cervical spine rotation and extension to functional ROM as needed for scanning environment without pain reproduction Baseline: 01/18/23: Cervical spine rotation R 51 deg, L 40 deg; extension 32 deg. Goal status: INITIAL    PLAN: PT FREQUENCY: 2x/week   PT DURATION: 6 weeks   PLANNED INTERVENTIONS: Therapeutic exercises, Therapeutic activity, Neuromuscular re-education, Balance training, Gait training, Patient/Family education, Joint mobilization, Electrical stimulation, Cryotherapy, Moist heat, Traction, Dry Needling, and Manual therapy   PLAN FOR NEXT SESSION: Continue with advanced phase rehab for R shoulder s/p RTC repair with focus on restoring full ROM and progressive RTC strengthening with advancing HEP. Focus manual therapy on cervicalgia; traction, STM, consider dry needling. Update passive accessory motion objective info for cervical spine. Repeated movement/MDT to reduce C-spine referred pain.     Valentina Gu, PT, DPT [id number]  Eilleen Kempf, PT 01/20/2023, 3:07 PM

## 2023-01-20 ENCOUNTER — Encounter: Payer: Self-pay | Admitting: Physical Therapy

## 2023-01-21 ENCOUNTER — Ambulatory Visit: Payer: Medicare Other | Admitting: Neurosurgery

## 2023-01-25 ENCOUNTER — Ambulatory Visit: Payer: Medicare Other | Attending: Orthopedic Surgery | Admitting: Physical Therapy

## 2023-01-25 ENCOUNTER — Encounter: Payer: Self-pay | Admitting: Physical Therapy

## 2023-01-25 DIAGNOSIS — M542 Cervicalgia: Secondary | ICD-10-CM | POA: Diagnosis present

## 2023-01-25 DIAGNOSIS — M25511 Pain in right shoulder: Secondary | ICD-10-CM | POA: Insufficient documentation

## 2023-01-25 DIAGNOSIS — M25611 Stiffness of right shoulder, not elsewhere classified: Secondary | ICD-10-CM | POA: Diagnosis present

## 2023-01-25 DIAGNOSIS — M6281 Muscle weakness (generalized): Secondary | ICD-10-CM | POA: Insufficient documentation

## 2023-01-25 NOTE — Therapy (Unsigned)
OUTPATIENT PHYSICAL THERAPY TREATMENT    Patient Name: Shawn Atkins MRN: [id number] DOB:Aug 09, 1956, 67 y.o., male Today's Date: 01/25/2023  END OF SESSION:   PT End of Session - 01/25/23 1415     Visit Number 37    Number of Visits 42    Date for PT Re-Evaluation 01/14/23    Authorization Type UHC Medicare, VL based on medical necessity    Progress Note Due on Visit 40    PT Start Time 1416    PT Stop Time 1459    PT Time Calculation (min) 43 min    Activity Tolerance Patient limited by pain;Patient tolerated treatment well    Behavior During Therapy WFL for tasks assessed/performed               Past Medical History:  Diagnosis Date   BPH (benign prostatic hypertrophy)    Bronchitis    Chronic prostatitis    Complication of anesthesia    Felt like "couldn't breathe" after triple Hernia surgery   Diabetes mellitus without complication    Flank pain    GERD (gastroesophageal reflux disease)    h/o   Gross hematuria    History of kidney stones    HTN (hypertension)    pt states he takes lisinopril for kidney protection due to dm not htn   Inguinal hernia    Nocturia    OSA on CPAP    with 02 2 L   Overweight    PONV (postoperative nausea and vomiting)    only during kidney stone surgery   Spermatocele    Stricture of urethra    Vertigo    1 episode, 6-7 yrs ago   Past Surgical History:  Procedure Laterality Date   BICEPT TENODESIS Right 08/06/2022   Procedure: BICEPS TENODESIS;  Surgeon: [name], MD;  Location: [facility];  Service: Orthopedics;  Laterality: Right;   CATARACT EXTRACTION W/PHACO Left 07/22/2020   Procedure: CATARACT EXTRACTION PHACO AND INTRAOCULAR LENS PLACEMENT ([facility]) LEFT DIABETIC 4.14  00:33.0;  Surgeon: Eulogio Bear, MD;  Location: [place];  Service: Ophthalmology;  Laterality: Left;  Diabetic - oral meds   CATARACT EXTRACTION W/PHACO Right 08/12/2020   Procedure: CATARACT EXTRACTION PHACO AND INTRAOCULAR LENS  PLACEMENT ([place]) RIGHT DIABETIC;  Surgeon: Eulogio Bear, MD;  Location: [place];  Service: Ophthalmology;  Laterality: Right;  1.99 0:26.2   COLONOSCOPY WITH PROPOFOL N/A 11/20/2015   Procedure: COLONOSCOPY WITH PROPOFOL;  Surgeon: Christene Lye, MD;  Location: [facility];  Service: Endoscopy;  Laterality: N/A;   HERNIA REPAIR Bilateral [id number]   umbilical and bil inguinal/ Dr Marina Gravel   KIDNEY STONE SURGERY     KNEE ARTHROSCOPY Left    open lithotomy     SHOULDER ARTHROSCOPY WITH OPEN ROTATOR CUFF REPAIR AND DISTAL CLAVICLE ACROMINECTOMY Right 08/06/2022   Procedure: SHOULDER ARTHROSCOPY WITH OPEN ROTATOR CUFF REPAIR AND DISTAL CLAVICLE ACROMINECTOMY;  Surgeon: [name], MD;  Location: [facility];  Service: Orthopedics;  Laterality: Right;   Patient Active Problem List   Diagnosis Date Noted   History of rectal bleeding 06/01/2018   Left groin pain 03/18/2018   Sacroiliac joint pain 01/13/2017   Abdominal pain, left lower quadrant 08/23/2015   BPH with obstruction/lower urinary tract symptoms 08/23/2015   Biceps tendinitis 06/25/2015     PCP: Lynnell Jude, MD REFERRING PROVIDER: [name], MD     Geronimo Boot, PA-C  REFERRING DIAG: M75.101 Unspecified rotator cuff tear or rupture of  right shoulder, not specified as traumatic M47.812 (ICD-10-CM) - Cervical spondylosis  M54.12 (ICD-10-CM) - Cervical radiculopathy    THERAPY DIAG:  Acute pain of right shoulder  Cervicalgia  Stiffness of right shoulder, not elsewhere classified  Muscle weakness (generalized)  Rationale for Evaluation and Treatment Rehabilitation   PERTINENT HISTORY: Pt is a 67 year old male s/p R shoulder rotator cuff repair and distal clavicle acromionectomy with Dr. Mack Guise on 08/06/22. Patient reports that his surgeon informed him he did have full tear of rotator cuff in surgery. Patient reports 4 days of severe pain just out of surgery. Patient reports managing pain well  with Tylenol/Ibuprofen well now. Pt followed up with [facility] for erythema and increased temperature as well as notable edema; ruled out infection and DVT. Patient reports comorbid neck and R upper trap pain. Pt reports difficulty with sleep positioning due to comorbid neck pain.    Pain:  Pain Intensity: Present: 2-3/10, Best: 2/10, Worst: 5-6/10 Pain location: R shoulder along deltoid and biceps Pain Quality: steady pain, intense intermittently; sharp pain along ACJ that comes and goes Radiating: Yes , to anterior arm Numbness/Tingling: No Aggravating factors: moving his R arm Relieving factors: Ibuprofen, Tylenol, resting arm/arm propped on armrest  History of prior shoulder or neck/shoulder injury, pain, surgery, or therapy: Yes, L shoulder with hx of cortisone injections, Hx of neck injury/PT for his neck Falls: Has patient fallen in last 6 months? No Dominant hand: left Imaging: Yes , MRI pre-operatively Prior level of function: Independent Occupational demands: Pt works part-time with [employer] (20 hrs/week), pt out of work now on medical leave Hobbies: [employer] (personal history of cancer, chills/fever, night sweats, nausea, vomiting, unrelenting pain): Negative   Precautions: None   Weight Bearing Restrictions: No   Living Environment Lives with: lives with their spouse Lives in: House/apartment     Patient Goals: Able to pick up weight with his arm, fish; able to play with baseball/bat with his grandson    PRECAUTIONS: No R shoulder AROM, no RUE lifting    DOS: 08/06/22, s/p R large RTC repair, R distal acromionectomy -8 weeks post-op 10/01/22 -12 weeks post-op 10/29/22    SUBJECTIVE:                                                                                                                                                                                      SUBJECTIVE STATEMENT:  Pt reports more pain into L paraspinal region versus R paraspinal region.  Patient reports remaining numbness/tingling affecting R radial styloid region. Patient reports having f/u with Dr. Mack Guise on 02/10/23. He reports some pain along R middle deltoid region with lifting his  arm above shoulder height.   PAIN:  Are you having pain? Yes, 3/10 pain at arrival to PT  Pain location: pain into R paracervical/upper trap region mainly     OBJECTIVE: (objective measures completed at initial evaluation unless otherwise dated)     Patient Surveys  FOTO: 4, predicted outcome score of 56     Cognition Patient is oriented to person, place, and time.  Recent memory is intact.  Remote memory is intact.  Attention span and concentration are intact.  Expressive speech is intact.  Patient's fund of knowledge is within normal limits for educational level.                          Gross Musculoskeletal Assessment Tremor: None Bulk: Atrophy of deltoid/posterior cuff mm Tone: Normal   Observation No sign of acute infection, mild erythema along R upper arm, mild increased tissue temperature along R middle deltoid     Posture FHRS posture     AROM               AROM (Normal range in degrees) AROM 08/17/2022 AROM 11/17/22 AROM 12/08/22 AROM 12/23/22 AROM 01/19/23    Right Left Right Left Right Left Right Left Right Left  Shoulder              Flexion Deferred   125 155 141 155 143 157 142 161  Extension Deferred            Abduction Deferred   128 165 147 165 155 WNL 155 WNL  External Rotation Deferred   80 WNL 87  WNL 86 WNL 90 WNL  Internal Rotation Deferred   50 WNL 70 WNL 57 WNL WNL WNL  Hands Behind Head Deferred   C7 T3 T2 T4 T2 T5 T3 T5  Hands Behind Back Deferred   L3 T12 L2 T12 L4 T7 T12 T10                 Elbow              Flexion WNL            Extension WNL            Pronation 50            Supination 60            (* = pain; Blank rows = not tested)     PROM         PROM (Normal range in degrees) PROM 08/17/2022 PROM 11/17/22  PROM 12/08/22    Right Left Right Left Right Left  Shoulder          Flexion 40    160 157 166  Extension          Abduction     108 WNL 111   External Rotation (arm at side) To neutral/0 deg    WNL ER at 90 deg ABD: 60 deg   Internal Rotation (arm at side) 45    WNL IR at 90 degr ABD: 70 deg         UE MMT:  MMT (out of 5) Right 11/17/22 Left 11/17/22 Right 12/08/22 Right  12/23/22 Right 01/19/23 Left 01/19/23             Shoulder       Flexion 3+  5  4- 4 4 5   Extension          Abduction  4-   4+ 4- 4 4* 4+  External rotation  4-  4+ 4 4 4* 4+  Internal rotation  4 5  4+ 5- 5 5  Horizontal abduction          Horizontal adduction          Lower Trapezius          Rhomboids                     Elbow      Flexion  4+ 5 5 5 5 5   Extension  4+  5 5 5  4+ 5  Pronation          Supination                     Wrist      Flexion       5 5  Extension       4 5  Radial deviation          Ulnar deviation          Finger ABD     WNL WNL  (* = pain; Blank rows = not tested)      Palpation   Location LEFT  RIGHT           Subocciptials      Cervical paraspinals      Upper Trapezius      Levator Scapulae      Rhomboid Major/Minor      Sternoclavicular joint      Acromioclavicular joint   2  Coracoid process   1  Long head of biceps   1  Supraspinatus   2  Infraspinatus   1  Subscapularis      Teres Minor      Teres Major      Pectoralis Major      Pectoralis Minor      Anterior Deltoid   1  Lateral Deltoid   2  Posterior Deltoid      Latissimus Dorsi      Sternocleidomastoid      (Blank rows = not tested) Graded on 0-4 scale (0 = no pain, 1 = pain, 2 = pain with wincing/grimacing/flinching, 3 = pain with withdrawal, 4 = unwilling to allow palpation), (Blank rows = not tested)    CERVICAL SPINE ASSESSMENT 01/19/23  AROM AROM (Normal range in degrees) AROM 01/19/23  Cervical  Flexion (50) 40  Extension (80) 32*  Right lateral flexion (45) 31 (pull on L)    Left lateral flexion (45) 20 (pull on R)  Right rotation (85) 51*  Left rotation (85) 40*  (* = pain; Blank rows = not tested)    Sensation Grossly intact to light touch bilateral UE as determined by testing dermatomes C2-T2. Proprioception and hot/cold testing deferred on this date.  Reflexes R/L Elbow: 2+/2+  Brachioradialis: 2+/2+  Tricep: 1+/1+  Repeated Movements Repeated cervical retraction in supine: pressure in mid-lower C-spine during, no worse after     SPECIAL TESTS Spurlings A (ipsilateral lateral flexion/axial compression): R: Positive L: Negative Distraction Test: Positive  Hoffman Sign (cervical cord compression): R: Negative L: Negative ULTT Median: R: Negative L: Negative    Location LEFT  RIGHT           Suboccipitals 0 0  Cervical paraspinals 0 2 (referred to R shoulder)  Upper Trapezius 0 2  Levator Scapulae 0 1  Rhomboid Major/Minor    (Blank  rows = not tested) Graded on 0-4 scale (0 = no pain, 1 = pain, 2 = pain with wincing/grimacing/flinching, 3 = pain with withdrawal, 4 = unwilling to allow palpation), (Blank rows = not tested)   PASSIVE ACCESSORY MOTION Next visit    TODAY'S TREATMENT 01/25/2023    Manual Therapy for cervical spine and R shoulder symptom modulation, soft tissue mobility and sensitivity; cervical spine mobility as needed for ROM; for R shoulder ROM and to prevent R shoulder stiffness   Manual general cervical traction;10 sec on, 10 sec off; x 8 minutes with pt in supine R shoulder PROM flexion, abduction, ER/IR within pt tolerance with gentle overpressure as able x 5 minutes STM R splenius cervicis C4-C7, L>R upper trapezius x 10 minutes   *not today* DTM/TPR bilat  UT; x 3 minutes -applied Biofreeze to R paracervical musculature and R upper trapezius for analgesic effect Gentle STM to R anterior and middle deltoid, biceps, upper trapezius; x 2 minutes DTM/Tpr R upper trapezius; x 8 minutes    Trigger Point Dry  Needling (TDN), unbilled Education performed with patient regarding potential benefit of TDN. Reviewed precautions and risks with patient. Extensive time spent with pt to ensure full understanding of TDN risks. Pt provided verbal consent to treatment. TDN performed to R splenius cervicis/capitis at C5 level, R C5 multifidus, and bilateral upper trapezius with 0.25 x 40 single needle placements with local twitch response (LTR). Pistoning technique utilized. Moderate post-treatment soreness with most discomfort along L upper trap reported.     Therapeutic Exercise - for shoulder ROM as needed for reaching and grasping tasks, self-care ADLs, and household chores    Repeated cervical retraction, in supine; 2x10   -pain in neck during, ongoing pain across neck after    Supine cervical sidebend stretch; 3x20 sec, R and L  --  Serratus slide, at wall on foam roller; 2x8, 5 sec hold at top; with yellow Tband    *next visit*  Bilateral ER with Tband; tactile cueing for scapular retraction and depression; 2x10, 3 sec hold; Green Tband   PATIENT EDUCATION: Discussed expectations with repeated movement program; we discussed post-DN expectations.     *not today* UE ergometer 2 minutes forward and 2 minutes backwards for muscle extensibility  - subjective obtained during this time intermittently, 1 minute not billed   Rhythmic stabilization at 100 deg flexion; 1 x 3 minutes with multidirectional perturbations in static position   Sidelying ER; 2x10, with 3-lb Dbell  -verbal and tactile cueing for scap retraction  Standing ER with Red Tband, towel roll under arm; 2x10  Standing scaption with Red Tband, one end anchored under foot; 2x10   -verbal cueing and demo for upright posture and avoidance of scapular hiking compensation Standing Tband row; Blue Tband; 2x10  Wall slide, abduction; x10 with 5 sec hold Shoulder wand flexion with 5-lb cuff weight for gravity-assisted stretch into end-range  flexion; 2x10 Standing IR step-outs for isometric contraction with Red Tband; 2x10 (3 lateral steps with tension)  - min verbal cueing for technique Sidelying shoulder abduction; 1x10, ROM as tolerated  Wand ER in standing, 90 deg abduction with arm propped on railing; 1x10, 3 sec Wall slide, flexion and abduction; x10 ea dir, 5 sec hold Sidelying ER; 2x10, 3-lb Dbell -verbal and tactile cueing for technique Standing Tband row, Blue Tband; 2x12 Finger ladder, x10, 5 sec hold at top for forward flexion  Supine flexion AROM; 2x10 Pulleys; shoulder complex abduction ; x 2 minutes Wand  ER AAROM; 2x10 Table slide PROM Flexion x15 Table slide PROM Abd x15 Gripping; pink Theraputty; composite fist, performed x 2 minutes Wrist AROM; flexion/extension, RD/UD, pronation/supination; reviewed for HEP  Cold pack (unbilled) - for anti-inflammatory and analgesic effect as needed for reduced pain and improved ability to participate in active PT intervention, along R shoulder in sitting, pillow propped under R forearm for upper limb support, x 5 minutes    PATIENT EDUCATION:  Education details: see above for patient education details Person educated: Patient Education method: Explanation Education comprehension: verbalized understanding     HOME EXERCISE PROGRAM: SHOULDER HEP Access Code: [email or web address]  CERVICAL SPINE HEP Access Code: [email or web address] URL: [email or web address] Date: 01/20/2023 Prepared by: Valentina Gu  Exercises - Supine Chin Tuck  - 5-6 x daily - 7 x weekly - 1 sets - 10 reps - 1sec hold - Seated Self Cervical Traction  - 2 x daily - 7 x weekly - 10-20 reps - 10sec hold - Supine Cervical Sidebending Stretch  - 2 x daily - 7 x weekly - 3 sets - 30sec hold      ASSESSMENT:   CLINICAL IMPRESSION: Patient has made good progress with restoration of R shoulder ROM with passive flexion, scaption, ER, and IR approaching WNL (though pt has some stiffness toward  end-ROM). He has active forward elevation motion deficit remaining for R shoulder. He has fair strength for flexion, abduction, and ER. Pt does still have pain with loading into abduction and ER. Pt has overlay of R upper quarter pain associated with cervical radiculopathy and associated referred pain from C-spine. Pt has new referral for cervicalgia; we completed assessment for cervical spine today along with update for shoulder ROM/MMT. Pt is appropriate for integrating targeted therapist for his neck given good progress to date on R shoulder motion. He does still need work on RTC strengthening and recovery of function for lifting/carrying/overhead work. Pt will likely be able to perform functional activities with upper limb better following treatment for cervical spine. 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 We initiated repeated movement program today for C-spine and discussed activity limitation and use of cervical roll to manage pain at this time. Pt has current impairments in: R shoulder AROM, deltoid and RTC/periscapular strength, R-sided neck pain with R upper limb referral/R forearm/hand paresthesias, R shoulder stiffness, postural changes. Patient will benefit from continued skilled PT to address above impairments and improve overall function.   REHAB POTENTIAL: Good   CLINICAL DECISION MAKING: Stable/uncomplicated   EVALUATION COMPLEXITY: Low     GOALS: Goals reviewed with patient? Yes   SHORT TERM GOALS: Target date: 09/14/2022   Pt will be independent with HEP to minimize adverse effects of immobilization and gently mobilize R shoulder to improve pain-free function at home and work. Baseline: 08/17/22: Baseline HEP initiated.   11/17/22:  Pt is compliant with HEP and verbalizes understanding of given exercises Goal status: ACHIEVED     LONG TERM  GOALS: Target date: 12/07/22   Pt will increase FOTO (for shoulder) to at least 56 to demonstrate significant improvement in function at home and work related to neck pain  Baseline: 08/17/22: 4.   11/17/22: 61/56 Goal status: ACHIEVED   2.  Pt will decrease worst shoulder pain by at least 3 points on the NPRS in order to demonstrate clinically significant reduction in shoulder pain. Baseline: 08/17/22: 5-6/10 at  worst over last week.   09/23/22: 2/10.   11/17/22: 2-3/10 at worst.    12/08/22: 2/10.    12/23/22: 2-3/10  Goal status: IN PROGRESS    3.   Pt will have R shoulder AROM at least within 10 degrees of contralateral upper extremity indicative of improved ROM as needed for reaching, overhead work, self-care activities/dressing/grooming Baseline: 08/17/22: NO AROM presently, severely limited PROM in early post-op phase.  09/23/22: AROM not appropriate yet at this point post-operatively   11/17/22: Motion loss remains in all planes of motion.  12/08/22: met for flexion and IR, not met for ER and ABD.    01/19/23: Met for all except flexion AROM tested in standing.  Goal status: MOSTLY MET    4.   Pt will have R shoulder MMTs at 4+/5 or greater for all directions performed indicative of improved strength as needed for completion of daily lifting, reaching, carrying tasks (e.g. 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  09/23/22: still deferred at this point post-operatively.   11/17/22: MMT for R shoulder 3+ to 4 (see chart above).    12/08/22: Not met for flexion, ABD, ER.    01/19/23: Not met for flexion, ABD, ER.  Goal status: ON-GOING   5.   Pt will perform unilateral farmer's carry with 15 lbs for 100 ft without reproduction of pain simulating carrying items in [employer] including carrying trash bag  Baseline: 08/17/22: Unable to perform lifting/carrying.  09/23/22: unable.   11/17/22: Deferred to later date.    12/08/22:  Deferred due to remaining weakness.  Goal status: DEFERRED   6.  Pt will decrease worst neck pain by at least 2 points on the NPRS in order to demonstrate clinically significant reduction in neck pain. Baseline: 01/19/23: Pain 8/10 at worst Goal status: INITIAL  7. Pt will increase FOTO (for neck) to at least 56 to demonstrate significant improvement in function at home and work related to neck pain  Baseline: 01/18/23: FOTO for neck created, intake survey to be completed next visit.   Goal status: INITIAL  7. Pt will improve cervical spine rotation and extension to functional ROM as needed for scanning environment without pain reproduction Baseline: 01/18/23: Cervical spine rotation R 51 deg, L 40 deg; extension 32 deg. Goal status: INITIAL    PLAN: PT FREQUENCY: 2x/week   PT DURATION: 6 weeks   PLANNED INTERVENTIONS: Therapeutic exercises, Therapeutic activity, Neuromuscular re-education, Balance training, Gait training, Patient/Family education, Joint mobilization, Electrical stimulation, Cryotherapy, Moist heat, Traction, Dry Needling, and Manual therapy   PLAN FOR NEXT SESSION: Continue with advanced phase rehab for R shoulder s/p RTC repair with focus on restoring full ROM and progressive RTC strengthening with advancing HEP. Focus manual therapy on cervicalgia; traction, STM, consider dry needling. Update passive accessory motion objective info for cervical spine. Repeated movement/MDT to reduce C-spine referred pain.     Valentina Gu, PT, DPT [id number]  Eilleen Kempf, PT 01/25/2023, 2:15 PM

## 2023-01-27 ENCOUNTER — Ambulatory Visit: Payer: Medicare Other | Admitting: Physical Therapy

## 2023-01-27 ENCOUNTER — Encounter: Payer: Self-pay | Admitting: Physical Therapy

## 2023-01-27 DIAGNOSIS — M25511 Pain in right shoulder: Secondary | ICD-10-CM | POA: Diagnosis not present

## 2023-01-27 DIAGNOSIS — M6281 Muscle weakness (generalized): Secondary | ICD-10-CM

## 2023-01-27 DIAGNOSIS — M542 Cervicalgia: Secondary | ICD-10-CM

## 2023-01-27 DIAGNOSIS — M25611 Stiffness of right shoulder, not elsewhere classified: Secondary | ICD-10-CM

## 2023-01-27 NOTE — Therapy (Signed)
OUTPATIENT PHYSICAL THERAPY TREATMENT    Patient Name: Shawn Atkins MRN: [id number] DOB:01/31/1956, 67 y.o., male Today's Date: 01/27/2023  END OF SESSION:   PT End of Session - 01/27/23 1413     Visit Number 38    Number of Visits 42    Date for PT Re-Evaluation 01/14/23    Authorization Type [employer] Medicare, VL based on medical necessity    Progress Note Due on Visit 40    PT Start Time 1417    PT Stop Time 1500    PT Time Calculation (min) 43 min    Activity Tolerance Patient limited by pain;Patient tolerated treatment well    Behavior During Therapy WFL for tasks assessed/performed               Past Medical History:  Diagnosis Date   BPH (benign prostatic hypertrophy)    Bronchitis    Chronic prostatitis    Complication of anesthesia    Felt like "couldn't breathe" after triple Hernia surgery   Diabetes mellitus without complication    Flank pain    GERD (gastroesophageal reflux disease)    h/o   Gross hematuria    History of kidney stones    HTN (hypertension)    pt states he takes lisinopril for kidney protection due to dm not htn   Inguinal hernia    Nocturia    OSA on CPAP    with 02 2 L   Overweight    PONV (postoperative nausea and vomiting)    only during kidney stone surgery   Spermatocele    Stricture of urethra    Vertigo    1 episode, 6-7 yrs ago   Past Surgical History:  Procedure Laterality Date   BICEPT TENODESIS Right 08/06/2022   Procedure: BICEPS TENODESIS;  Surgeon: [name], MD;  Location: [facility];  Service: Orthopedics;  Laterality: Right;   CATARACT EXTRACTION W/PHACO Left 07/22/2020   Procedure: CATARACT EXTRACTION PHACO AND INTRAOCULAR LENS PLACEMENT (IOC) LEFT DIABETIC 4.14  00:33.0;  Surgeon: Eulogio Bear, MD;  Location: [facility];  Service: Ophthalmology;  Laterality: Left;  Diabetic - oral meds   CATARACT EXTRACTION W/PHACO Right 08/12/2020   Procedure: CATARACT EXTRACTION PHACO AND INTRAOCULAR LENS  PLACEMENT ([place]) RIGHT DIABETIC;  Surgeon: Eulogio Bear, MD;  Location: [facility];  Service: Ophthalmology;  Laterality: Right;  1.99 0:26.2   COLONOSCOPY WITH PROPOFOL N/A 11/20/2015   Procedure: COLONOSCOPY WITH PROPOFOL;  Surgeon: Christene Lye, MD;  Location: [facility];  Service: Endoscopy;  Laterality: N/A;   HERNIA REPAIR Bilateral [id number]   umbilical and bil inguinal/ Dr Marina Gravel   KIDNEY STONE SURGERY     KNEE ARTHROSCOPY Left    open lithotomy     SHOULDER ARTHROSCOPY WITH OPEN ROTATOR CUFF REPAIR AND DISTAL CLAVICLE ACROMINECTOMY Right 08/06/2022   Procedure: SHOULDER ARTHROSCOPY WITH OPEN ROTATOR CUFF REPAIR AND DISTAL CLAVICLE ACROMINECTOMY;  Surgeon: [name], MD;  Location: [facility];  Service: Orthopedics;  Laterality: Right;   Patient Active Problem List   Diagnosis Date Noted   History of rectal bleeding 06/01/2018   Left groin pain 03/18/2018   Sacroiliac joint pain 01/13/2017   Abdominal pain, left lower quadrant 08/23/2015   BPH with obstruction/lower urinary tract symptoms 08/23/2015   Biceps tendinitis 06/25/2015     PCP: Lynnell Jude, MD REFERRING PROVIDER: [name], MD     Geronimo Boot, PA-C  REFERRING DIAG: M75.101 Unspecified rotator cuff tear or rupture of  right shoulder, not specified as traumatic M47.812 (ICD-10-CM) - Cervical spondylosis  M54.12 (ICD-10-CM) - Cervical radiculopathy    THERAPY DIAG:  Acute pain of right shoulder  Cervicalgia  Stiffness of right shoulder, not elsewhere classified  Muscle weakness (generalized)  Rationale for Evaluation and Treatment Rehabilitation   PERTINENT HISTORY: Pt is a 67 year old male s/p R shoulder rotator cuff repair and distal clavicle acromionectomy with Dr. Mack Guise on 08/06/22. Patient reports that his surgeon informed him he did have full tear of rotator cuff in surgery. Patient reports 4 days of severe pain just out of surgery. Patient reports managing pain well  with Tylenol/Ibuprofen well now. Pt followed up with [facility] for erythema and increased temperature as well as notable edema; ruled out infection and DVT. Patient reports comorbid neck and R upper trap pain. Pt reports difficulty with sleep positioning due to comorbid neck pain.    Pain:  Pain Intensity: Present: 2-3/10, Best: 2/10, Worst: 5-6/10 Pain location: R shoulder along deltoid and biceps Pain Quality: steady pain, intense intermittently; sharp pain along ACJ that comes and goes Radiating: Yes , to anterior arm Numbness/Tingling: No Aggravating factors: moving his R arm Relieving factors: Ibuprofen, Tylenol, resting arm/arm propped on armrest  History of prior shoulder or neck/shoulder injury, pain, surgery, or therapy: Yes, L shoulder with hx of cortisone injections, Hx of neck injury/PT for his neck Falls: Has patient fallen in last 6 months? No Dominant hand: left Imaging: Yes , MRI pre-operatively Prior level of function: Independent Occupational demands: Pt works part-time with [employer] (20 hrs/week), pt out of work now on medical leave Hobbies: [employer] (personal history of cancer, chills/fever, night sweats, nausea, vomiting, unrelenting pain): Negative   Precautions: None   Weight Bearing Restrictions: No   Living Environment Lives with: lives with their spouse Lives in: House/apartment     Patient Goals: Able to pick up weight with his arm, fish; able to play with baseball/bat with his grandson    PRECAUTIONS: No R shoulder AROM, no RUE lifting    DOS: 08/06/22, s/p R large RTC repair, R distal acromionectomy -8 weeks post-op 10/01/22 -12 weeks post-op 10/29/22    SUBJECTIVE:                                                                                                                                                                                      SUBJECTIVE STATEMENT:  Pt reports having rough morning with rainy/inclement weather. He reports  some swelling along upper traps after last visit that resolved the next day. Patient reports pain along paracervical and upper trap region. Patient reports compliance with HEP. He feels that DN did  help with pain following last visit. He wants to defer another trial of DN until future visit.   PAIN:  Are you having pain? Yes, 2/10 pain at arrival to PT  Pain location: pain into R paracervical/upper trap region mainly     OBJECTIVE: (objective measures completed at initial evaluation unless otherwise dated)     Patient Surveys  FOTO: 4, predicted outcome score of 56     Cognition Patient is oriented to person, place, and time.  Recent memory is intact.  Remote memory is intact.  Attention span and concentration are intact.  Expressive speech is intact.  Patient's fund of knowledge is within normal limits for educational level.                          Gross Musculoskeletal Assessment Tremor: None Bulk: Atrophy of deltoid/posterior cuff mm Tone: Normal   Observation No sign of acute infection, mild erythema along R upper arm, mild increased tissue temperature along R middle deltoid     Posture FHRS posture     AROM               AROM (Normal range in degrees) AROM 08/17/2022 AROM 11/17/22 AROM 12/08/22 AROM 12/23/22 AROM 01/19/23    Right Left Right Left Right Left Right Left Right Left  Shoulder              Flexion Deferred   125 155 141 155 143 157 142 161  Extension Deferred            Abduction Deferred   128 165 147 165 155 WNL 155 WNL  External Rotation Deferred   80 WNL 87  WNL 86 WNL 90 WNL  Internal Rotation Deferred   50 WNL 70 WNL 57 WNL WNL WNL  Hands Behind Head Deferred   C7 T3 T2 T4 T2 T5 T3 T5  Hands Behind Back Deferred   L3 T12 L2 T12 L4 T7 T12 T10                 Elbow              Flexion WNL            Extension WNL            Pronation 50            Supination 60            (* = pain; Blank rows = not tested)     PROM         PROM  (Normal range in degrees) PROM 08/17/2022 PROM 11/17/22 PROM 12/08/22    Right Left Right Left Right Left  Shoulder          Flexion 40    160 157 166  Extension          Abduction     108 WNL 111   External Rotation (arm at side) To neutral/0 deg    WNL ER at 90 deg ABD: 60 deg   Internal Rotation (arm at side) 45    WNL IR at 90 degr ABD: 70 deg         UE MMT:  MMT (out of 5) Right 11/17/22 Left 11/17/22 Right 12/08/22 Right  12/23/22 Right 01/19/23 Left 01/19/23             Shoulder       Flexion 3+  5  4- 4 4  5  Extension          Abduction 4-   4+ 4- 4 4* 4+  External rotation  4-  4+ 4 4 4* 4+  Internal rotation  4 5  4+ 5- 5 5  Horizontal abduction          Horizontal adduction          Lower Trapezius          Rhomboids                     Elbow      Flexion  4+ 5 5 5 5 5   Extension  4+  5 5 5  4+ 5  Pronation          Supination                     Wrist      Flexion       5 5  Extension       4 5  Radial deviation          Ulnar deviation          Finger ABD     WNL WNL  (* = pain; Blank rows = not tested)      Palpation   Location LEFT  RIGHT           Subocciptials      Cervical paraspinals      Upper Trapezius      Levator Scapulae      Rhomboid Major/Minor      Sternoclavicular joint      Acromioclavicular joint   2  Coracoid process   1  Long head of biceps   1  Supraspinatus   2  Infraspinatus   1  Subscapularis      Teres Minor      Teres Major      Pectoralis Major      Pectoralis Minor      Anterior Deltoid   1  Lateral Deltoid   2  Posterior Deltoid      Latissimus Dorsi      Sternocleidomastoid      (Blank rows = not tested) Graded on 0-4 scale (0 = no pain, 1 = pain, 2 = pain with wincing/grimacing/flinching, 3 = pain with withdrawal, 4 = unwilling to allow palpation), (Blank rows = not tested)    CERVICAL SPINE ASSESSMENT 01/19/23  AROM AROM (Normal range in degrees) AROM 01/19/23  Cervical  Flexion (50) 40  Extension  (80) 32*  Right lateral flexion (45) 31 (pull on L)   Left lateral flexion (45) 20 (pull on R)  Right rotation (85) 51*  Left rotation (85) 40*  (* = pain; Blank rows = not tested)    Sensation Grossly intact to light touch bilateral UE as determined by testing dermatomes C2-T2. Proprioception and hot/cold testing deferred on this date.  Reflexes R/L Elbow: 2+/2+  Brachioradialis: 2+/2+  Tricep: 1+/1+  Repeated Movements Repeated cervical retraction in supine: pressure in mid-lower C-spine during, no worse after     SPECIAL TESTS Spurlings A (ipsilateral lateral flexion/axial compression): R: Positive L: Negative Distraction Test: Positive  Hoffman Sign (cervical cord compression): R: Negative L: Negative ULTT Median: R: Negative L: Negative    Location LEFT  RIGHT           Suboccipitals 0 0  Cervical paraspinals 0 2 (referred to R shoulder)  Upper Trapezius 0  2  Levator Scapulae 0 1  Rhomboid Major/Minor    (Blank rows = not tested) Graded on 0-4 scale (0 = no pain, 1 = pain, 2 = pain with wincing/grimacing/flinching, 3 = pain with withdrawal, 4 = unwilling to allow palpation), (Blank rows = not tested)   PASSIVE ACCESSORY MOTION Cervical spine: Hypomobile CPA at C5-7 and CT junction, decreased sideglide R to L worse than L to R at C4-C6     TODAY'S TREATMENT 01/27/2023    Manual Therapy for cervical spine and R shoulder symptom modulation, soft tissue mobility and sensitivity; cervical spine mobility as needed for ROM; for R shoulder ROM and to prevent R shoulder stiffness   Manual general cervical traction;10 sec on, 10 sec off; x 5 minutes with pt in supine STM bilateral splenius cervicis C4-C7, L>R upper trapezius x 10 minutes  *Update for passive accessory motion in objective section  Cervical sideglides; R to L, and L to R; 2 x 30 sec at C3-6   *not today* R shoulder PROM flexion, abduction, ER/IR within pt tolerance with gentle overpressure as able x  5 minutes DTM/TPR bilat  UT; x 3 minutes -applied Biofreeze to R paracervical musculature and R upper trapezius for analgesic effect Gentle STM to R anterior and middle deltoid, biceps, upper trapezius; x 2 minutes DTM/Tpr R upper trapezius; x 8 minutes Trigger Point Dry Needling (TDN), unbilled Education performed with patient regarding potential benefit of TDN. Reviewed precautions and risks with patient. Extensive time spent with pt to ensure full understanding of TDN risks. Pt provided verbal consent to treatment. TDN performed to R splenius cervicis/capitis at C5 level, R C5 multifidus, and bilateral upper trapezius with 0.25 x 40 single needle placements with local twitch response (LTR). Pistoning technique utilized. Moderate post-treatment soreness with most discomfort along L upper trap reported.     Mechanical Traction - for nerve root decompression and pain relief; x 10 minutes with 19 lbs with static hold using Saunder's Unit on treatment table                          -symptom are abolished with use of mechanical traction today     Therapeutic Exercise - for shoulder ROM as needed for reaching and grasping tasks, self-care ADLs, and household chores   We checked shoulder AROM with pt exhibiting moderate end-range R shoulder flexion AROM deficit and patient reports pain with active elevation beyond shoulder height in sagittal and frontal plane post-treatment today     *next visit*  Bilateral ER with Tband; tactile cueing for scapular retraction and depression; 2x10, 3 sec hold; Green Tband   *not today* Repeated cervical retraction, in supine; 2x10 Serratus slide, at wall on foam roller; 2x8, 5 sec hold at top; with yellow Tband  Supine cervical sidebend stretch; 3x20 sec, R and L UE ergometer 2 minutes forward and 2 minutes backwards for muscle extensibility  - subjective obtained during this time intermittently, 1 minute not billed   Rhythmic stabilization at 100 deg  flexion; 1 x 3 minutes with multidirectional perturbations in static position   Sidelying ER; 2x10, with 3-lb Dbell  -verbal and tactile cueing for scap retraction  Standing ER with Red Tband, towel roll under arm; 2x10  Standing scaption with Red Tband, one end anchored under foot; 2x10   -verbal cueing and demo for upright posture and avoidance of scapular hiking compensation Standing Tband row; Blue Tband; 2x10  Wall slide, abduction;  x10 with 5 sec hold Shoulder wand flexion with 5-lb cuff weight for gravity-assisted stretch into end-range flexion; 2x10 Standing IR step-outs for isometric contraction with Red Tband; 2x10 (3 lateral steps with tension)  - min verbal cueing for technique Sidelying shoulder abduction; 1x10, ROM as tolerated  Wand ER in standing, 90 deg abduction with arm propped on railing; 1x10, 3 sec Wall slide, flexion and abduction; x10 ea dir, 5 sec hold Sidelying ER; 2x10, 3-lb Dbell -verbal and tactile cueing for technique Standing Tband row, Blue Tband; 2x12 Finger ladder, x10, 5 sec hold at top for forward flexion  Supine flexion AROM; 2x10 Pulleys; shoulder complex abduction ; x 2 minutes Wand ER AAROM; 2x10 Table slide PROM Flexion x15 Table slide PROM Abd x15 Gripping; pink Theraputty; composite fist, performed x 2 minutes Wrist AROM; flexion/extension, RD/UD, pronation/supination; reviewed for HEP  Cold pack (unbilled) - for anti-inflammatory and analgesic effect as needed for reduced pain and improved ability to participate in active PT intervention, along R shoulder in sitting, pillow propped under R forearm for upper limb support, x 5 minutes    PATIENT EDUCATION:  Education details: see above for patient education details Person educated: Patient Education method: Explanation Education comprehension: verbalized understanding     HOME EXERCISE PROGRAM: SHOULDER HEP Access Code: [email or web address]  CERVICAL SPINE HEP Access Code: [email or web address] URL:  [email or web address] Date: 01/20/2023 Prepared by: Valentina Gu  Exercises - Supine Chin Tuck  - 5-6 x daily - 7 x weekly - 1 sets - 10 reps - 1sec hold - Seated Self Cervical Traction  - 2 x daily - 7 x weekly - 10-20 reps - 10sec hold - Supine Cervical Sidebending Stretch  - 2 x daily - 7 x weekly - 3 sets - 30sec hold      ASSESSMENT:   CLINICAL IMPRESSION: Patient did have good response to DN and fortunately has relatively low NPRS today. He does exhibit WFL R C-spine rotation, though L rotation is still markedly limited. He has normal cervical spine flexion without significant pain reproduction. Pt has ongoing pain and stiffness with C-spine extension. Pt needs to continue frequent work on R shoulder ROM given remaining deficit in shoulder elevation mobility. Pt has remaining impairments in: R shoulder AROM, deltoid and RTC/periscapular strength, R-sided neck pain with R upper limb referral/R forearm/hand paresthesias, R shoulder stiffness, postural changes. Patient will benefit from continued skilled PT to address above impairments and improve overall function.   REHAB POTENTIAL: Good   CLINICAL DECISION MAKING: Stable/uncomplicated   EVALUATION COMPLEXITY: Low     GOALS: Goals reviewed with patient? Yes   SHORT TERM GOALS: Target date: 09/14/2022   Pt will be independent with HEP to minimize adverse effects of immobilization and gently mobilize R shoulder to improve pain-free function at home and work. Baseline: 08/17/22: Baseline HEP initiated.   11/17/22:  Pt is compliant with HEP and verbalizes understanding of given exercises Goal status: ACHIEVED     LONG TERM GOALS: Target date: 12/07/22   Pt will increase FOTO (for shoulder) to at least 56 to demonstrate significant improvement in function at home and work related to neck pain  Baseline: 08/17/22: 4.   11/17/22: 61/56 Goal status: ACHIEVED   2.  Pt will decrease worst shoulder pain by at least 3  points on the NPRS in order to demonstrate clinically significant reduction in shoulder pain. Baseline: 08/17/22: 5-6/10 at worst over last week.   09/23/22: 2/10.   11/17/22: 2-3/10 at worst.  12/08/22: 2/10.    12/23/22: 2-3/10  Goal status: IN PROGRESS    3.   Pt will have R shoulder AROM at least within 10 degrees of contralateral upper extremity indicative of improved ROM as needed for reaching, overhead work, self-care activities/dressing/grooming Baseline: 08/17/22: NO AROM presently, severely limited PROM in early post-op phase.  09/23/22: AROM not appropriate yet at this point post-operatively   11/17/22: Motion loss remains in all planes of motion.  12/08/22: met for flexion and IR, not met for ER and ABD.    01/19/23: Met for all except flexion AROM tested in standing.  Goal status: MOSTLY MET    4.   Pt will have R shoulder MMTs at 4+/5 or greater for all directions performed indicative of improved strength as needed for completion of daily lifting, reaching, carrying tasks (e.g. 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  09/23/22: still deferred at this point post-operatively.   11/17/22: MMT for R shoulder 3+ to 4 (see chart above).    12/08/22: Not met for flexion, ABD, ER.    01/19/23: Not met for flexion, ABD, ER.  Goal status: ON-GOING   5.   Pt will perform unilateral farmer's carry with 15 lbs for 100 ft without reproduction of pain simulating carrying items in [employer] including carrying trash bag  Baseline: 08/17/22: Unable to perform lifting/carrying.  09/23/22: unable.   11/17/22: Deferred to later date.    12/08/22: Deferred due to remaining weakness.  Goal status: DEFERRED   6.  Pt will decrease worst neck pain by at least 2 points on the NPRS in order to demonstrate clinically significant reduction in neck pain. Baseline: 01/19/23: Pain 8/10 at worst Goal status: INITIAL  7. Pt will increase FOTO  (for neck) to at least 56 to demonstrate significant improvement in function at home and work related to neck pain  Baseline: 01/18/23: FOTO for neck created, intake survey to be completed next visit.   Goal status: INITIAL  7. Pt will improve cervical spine rotation and extension to functional ROM as needed for scanning environment without pain reproduction Baseline: 01/18/23: Cervical spine rotation R 51 deg, L 40 deg; extension 32 deg. Goal status: INITIAL    PLAN: PT FREQUENCY: 2x/week   PT DURATION: 6 weeks   PLANNED INTERVENTIONS: Therapeutic exercises, Therapeutic activity, Neuromuscular re-education, Balance training, Gait training, Patient/Family education, Joint mobilization, Electrical stimulation, Cryotherapy, Moist heat, Traction, Dry Needling, and Manual therapy   PLAN FOR NEXT SESSION: Continue with advanced phase rehab for R shoulder s/p RTC repair with focus on restoring full ROM and progressive RTC strengthening with advancing HEP. Focus manual therapy on cervicalgia; traction, STM, consider dry needling. Repeated movement/MDT to reduce C-spine referred pain.     Valentina Gu, PT, DPT [id number]  Eilleen Kempf, PT 01/27/2023, 2:21 PM

## 2023-02-03 ENCOUNTER — Encounter: Payer: Self-pay | Admitting: Physical Therapy

## 2023-02-08 ENCOUNTER — Ambulatory Visit: Payer: Medicare Other

## 2023-02-08 DIAGNOSIS — M25511 Pain in right shoulder: Secondary | ICD-10-CM | POA: Diagnosis not present

## 2023-02-08 DIAGNOSIS — M542 Cervicalgia: Secondary | ICD-10-CM

## 2023-02-08 DIAGNOSIS — M25611 Stiffness of right shoulder, not elsewhere classified: Secondary | ICD-10-CM

## 2023-02-08 DIAGNOSIS — M6281 Muscle weakness (generalized): Secondary | ICD-10-CM

## 2023-02-08 NOTE — Therapy (Signed)
OUTPATIENT PHYSICAL THERAPY TREATMENT    Patient Name: Shawn Atkins MRN: [id number] DOB:[birth year], 67 y.o., male Today's Date: 02/08/2023  END OF SESSION:   PT End of Session - 02/08/23 1552     Visit Number 39    Number of Visits 42    Date for PT Re-Evaluation 01/14/23    Progress Note Due on Visit 40    PT Start Time 1033    PT Stop Time 1116    PT Time Calculation (min) 43 min    Activity Tolerance Patient tolerated treatment well    Behavior During Therapy WFL for tasks assessed/performed               Past Medical History:  Diagnosis Date   BPH (benign prostatic hypertrophy)    Bronchitis    Chronic prostatitis    Complication of anesthesia    Felt like "couldn't breathe" after triple Hernia surgery   Diabetes mellitus without complication    Flank pain    GERD (gastroesophageal reflux disease)    h/o   Gross hematuria    History of kidney stones    HTN (hypertension)    pt states he takes lisinopril for kidney protection due to dm not htn   Inguinal hernia    Nocturia    OSA on CPAP    with 02 2 L   Overweight    PONV (postoperative nausea and vomiting)    only during kidney stone surgery   Spermatocele    Stricture of urethra    Vertigo    1 episode, 6-7 yrs ago   Past Surgical History:  Procedure Laterality Date   BICEPT TENODESIS Right 08/06/2022   Procedure: BICEPS TENODESIS;  Surgeon: Juanell Fairly, MD;  Location: [facility];  Service: Orthopedics;  Laterality: Right;   CATARACT EXTRACTION W/PHACO Left 07/22/2020   Procedure: CATARACT EXTRACTION PHACO AND INTRAOCULAR LENS PLACEMENT (IOC) LEFT DIABETIC 4.14  00:33.0;  Surgeon: Nevada Crane, MD;  Location: [facility];  Service: Ophthalmology;  Laterality: Left;  Diabetic - oral meds   CATARACT EXTRACTION W/PHACO Right 08/12/2020   Procedure: CATARACT EXTRACTION PHACO AND INTRAOCULAR LENS PLACEMENT (IOC) RIGHT DIABETIC;  Surgeon: Nevada Crane, MD;  Location: [facility];  Service: Ophthalmology;  Laterality: Right;  1.99 0:26.2   COLONOSCOPY WITH PROPOFOL N/A 11/20/2015   Procedure: COLONOSCOPY WITH PROPOFOL;  Surgeon: Kieth Brightly, MD;  Location: [facility];  Service: Endoscopy;  Laterality: N/A;   HERNIA REPAIR Bilateral 2014   umbilical and bil inguinal/ Dr Egbert Garibaldi   KIDNEY STONE SURGERY     KNEE ARTHROSCOPY Left    open lithotomy     SHOULDER ARTHROSCOPY WITH OPEN ROTATOR CUFF REPAIR AND DISTAL CLAVICLE ACROMINECTOMY Right 08/06/2022   Procedure: SHOULDER ARTHROSCOPY WITH OPEN ROTATOR CUFF REPAIR AND DISTAL CLAVICLE ACROMINECTOMY;  Surgeon: Juanell Fairly, MD;  Location: [facility];  Service: Orthopedics;  Laterality: Right;   Patient Active Problem List   Diagnosis Date Noted   History of rectal bleeding 06/01/2018   Left groin pain 03/18/2018   Sacroiliac joint pain 01/13/2017   Abdominal pain, left lower quadrant 08/23/2015   BPH with obstruction/lower urinary tract symptoms 08/23/2015   Biceps tendinitis 06/25/2015     PCP: Dortha Kern, MD REFERRING PROVIDER: Juanell Fairly, MD     Drake Leach, PA-C  REFERRING DIAG: M75.101 Unspecified rotator cuff tear or rupture of right shoulder, not specified as traumatic M47.812 (ICD-10-CM) - Cervical spondylosis  M54.12 (ICD-10-CM) -  Cervical radiculopathy    THERAPY DIAG:  Acute pain of right shoulder  Cervicalgia  Stiffness of right shoulder, not elsewhere classified  Muscle weakness (generalized)  Rationale for Evaluation and Treatment Rehabilitation   PERTINENT HISTORY: Pt is a 67 year old male s/p R shoulder rotator cuff repair and distal clavicle acromionectomy with Dr. Martha Clan on 08/06/22. Patient reports that his surgeon informed him he did have full tear of rotator cuff in surgery. Patient reports 4 days of severe pain just out of surgery. Patient reports managing pain well with Tylenol/Ibuprofen well now. Pt followed up with [facility] for erythema and  increased temperature as well as notable edema; ruled out infection and DVT. Patient reports comorbid neck and R upper trap pain. Pt reports difficulty with sleep positioning due to comorbid neck pain.    Pain:  Pain Intensity: Present: 2-3/10, Best: 2/10, Worst: 5-6/10 Pain location: R shoulder along deltoid and biceps Pain Quality: steady pain, intense intermittently; sharp pain along ACJ that comes and goes Radiating: Yes , to anterior arm Numbness/Tingling: No Aggravating factors: moving his R arm Relieving factors: Ibuprofen, Tylenol, resting arm/arm propped on armrest  History of prior shoulder or neck/shoulder injury, pain, surgery, or therapy: Yes, L shoulder with hx of cortisone injections, Hx of neck injury/PT for his neck Falls: Has patient fallen in last 6 months? No Dominant hand: left Imaging: Yes , MRI pre-operatively Prior level of function: Independent Occupational demands: Pt works part-time with [employer] (20 hrs/week), pt out of work now on medical leave Hobbies: [employer] (personal history of cancer, chills/fever, night sweats, nausea, vomiting, unrelenting pain): Negative   Precautions: None   Weight Bearing Restrictions: No   Living Environment Lives with: lives with their spouse Lives in: House/apartment     Patient Goals: Able to pick up weight with his arm, fish; able to play with baseball/bat with his grandson    PRECAUTIONS: No R shoulder AROM, no RUE lifting    DOS: 08/06/22, s/p R large RTC repair, R distal acromionectomy -8 weeks post-op 10/01/22 -12 weeks post-op 10/29/22    SUBJECTIVE:                                                                                                                                                                                      SUBJECTIVE STATEMENT:  Pt reports having rough morning with rainy/inclement weather. He reports some swelling along upper traps after last visit that resolved the next day. Patient  reports pain along paracervical and upper trap region. Patient reports compliance with HEP. He feels that DN did help with pain following last visit. He wants to defer another trial of DN until  future visit.   PAIN:  Are you having pain? Yes, 2/10 pain at arrival to PT  Pain location: pain into R paracervical/upper trap region mainly     OBJECTIVE: (objective measures completed at initial evaluation unless otherwise dated)     Patient Surveys  FOTO: 4, predicted outcome score of 56     Cognition Patient is oriented to person, place, and time.  Recent memory is intact.  Remote memory is intact.  Attention span and concentration are intact.  Expressive speech is intact.  Patient's fund of knowledge is within normal limits for educational level.                          Gross Musculoskeletal Assessment Tremor: None Bulk: Atrophy of deltoid/posterior cuff mm Tone: Normal   Observation No sign of acute infection, mild erythema along R upper arm, mild increased tissue temperature along R middle deltoid     Posture FHRS posture     AROM               AROM (Normal range in degrees) AROM 08/17/2022 AROM 11/17/22 AROM 12/08/22 AROM 12/23/22 AROM 01/19/23    Right Left Right Left Right Left Right Left Right Left  Shoulder              Flexion Deferred   125 155 141 155 143 157 142 161  Extension Deferred            Abduction Deferred   128 165 147 165 155 WNL 155 WNL  External Rotation Deferred   80 WNL 87  WNL 86 WNL 90 WNL  Internal Rotation Deferred   50 WNL 70 WNL 57 WNL WNL WNL  Hands Behind Head Deferred   C7 T3 T2 T4 T2 T5 T3 T5  Hands Behind Back Deferred   L3 T12 L2 T12 L4 T7 T12 T10                 Elbow              Flexion WNL            Extension WNL            Pronation 50            Supination 60            (* = pain; Blank rows = not tested)     PROM         PROM (Normal range in degrees) PROM 08/17/2022 PROM 11/17/22 PROM 12/08/22    Right Left  Right Left Right Left  Shoulder          Flexion 40    160 157 166  Extension          Abduction     108 WNL 111   External Rotation (arm at side) To neutral/0 deg    WNL ER at 90 deg ABD: 60 deg   Internal Rotation (arm at side) 45    WNL IR at 90 degr ABD: 70 deg         UE MMT:  MMT (out of 5) Right 11/17/22 Left 11/17/22 Right 12/08/22 Right  12/23/22 Right 01/19/23 Left 01/19/23             Shoulder       Flexion 3+  5  4- 4 4 5   Extension          Abduction 4-  4+ 4- 4 4* 4+  External rotation  4-  4+ 4 4 4* 4+  Internal rotation  4 5  4+ 5- 5 5  Horizontal abduction          Horizontal adduction          Lower Trapezius          Rhomboids                     Elbow      Flexion  4+ 5 5 5 5 5   Extension  4+  5 5 5  4+ 5  Pronation          Supination                     Wrist      Flexion       5 5  Extension       4 5  Radial deviation          Ulnar deviation          Finger ABD     WNL WNL  (* = pain; Blank rows = not tested)      Palpation   Location LEFT  RIGHT           Subocciptials      Cervical paraspinals      Upper Trapezius      Levator Scapulae      Rhomboid Major/Minor      Sternoclavicular joint      Acromioclavicular joint   2  Coracoid process   1  Long head of biceps   1  Supraspinatus   2  Infraspinatus   1  Subscapularis      Teres Minor      Teres Major      Pectoralis Major      Pectoralis Minor      Anterior Deltoid   1  Lateral Deltoid   2  Posterior Deltoid      Latissimus Dorsi      Sternocleidomastoid      (Blank rows = not tested) Graded on 0-4 scale (0 = no pain, 1 = pain, 2 = pain with wincing/grimacing/flinching, 3 = pain with withdrawal, 4 = unwilling to allow palpation), (Blank rows = not tested)    CERVICAL SPINE ASSESSMENT 01/19/23  AROM AROM (Normal range in degrees) AROM 01/19/23  Cervical  Flexion (50) 40  Extension (80) 32*  Right lateral flexion (45) 31 (pull on L)   Left lateral flexion (45) 20  (pull on R)  Right rotation (85) 51*  Left rotation (85) 40*  (* = pain; Blank rows = not tested)    Sensation Grossly intact to light touch bilateral UE as determined by testing dermatomes C2-T2. Proprioception and hot/cold testing deferred on this date.  Reflexes R/L Elbow: 2+/2+  Brachioradialis: 2+/2+  Tricep: 1+/1+  Repeated Movements Repeated cervical retraction in supine: pressure in mid-lower C-spine during, no worse after     SPECIAL TESTS Spurlings A (ipsilateral lateral flexion/axial compression): R: Positive L: Negative Distraction Test: Positive  Hoffman Sign (cervical cord compression): R: Negative L: Negative ULTT Median: R: Negative L: Negative    Location LEFT  RIGHT           Suboccipitals 0 0  Cervical paraspinals 0 2 (referred to R shoulder)  Upper Trapezius 0 2  Levator Scapulae 0 1  Rhomboid Major/Minor    (Blank rows = not  tested) Graded on 0-4 scale (0 = no pain, 1 = pain, 2 = pain with wincing/grimacing/flinching, 3 = pain with withdrawal, 4 = unwilling to allow palpation), (Blank rows = not tested)   PASSIVE ACCESSORY MOTION Cervical spine: Hypomobile CPA at C5-7 and CT junction, decreased sideglide R to L worse than L to R at C4-C6     TODAY'S TREATMENT 02/08/2023  Manual Therapy :   manual traction to C/S, PROM to C/S in all planes, Cranial release, Pectoralis minor stretch. PROM to [facility] joint, scapular mobs.   Therapeutic exs: 20 min UBE 3/3 For/Back with MH.  Chin tucks 2x 60 secs Serratus Ant punches #2 2 x 60 secs Scapular retraction 2 x 60 secs Shldr shrugs 2 x 60 secs #4   Therapeutic activities: Pt education regarding his  C/S condition and symptoms including numbness and tingling. Advised pt to purchase a home C/S traction to promote pain relief and ROM.    *Update for passive accessory motion in objective section  Cervical sideglides; R to L, and L to R; 2 x 30 sec at C3-6   *not today* R shoulder PROM flexion,  abduction, ER/IR within pt tolerance with gentle overpressure as able x 5 minutes DTM/TPR bilat  UT; x 3 minutes -applied Biofreeze to R paracervical musculature and R upper trapezius for analgesic effect Gentle STM to R anterior and middle deltoid, biceps, upper trapezius; x 2 minutes DTM/Tpr R upper trapezius; x 8 minutes Trigger Point Dry Needling (TDN), unbilled Education performed with patient regarding potential benefit of TDN. Reviewed precautions and risks with patient. Extensive time spent with pt to ensure full understanding of TDN risks. Pt provided verbal consent to treatment. TDN performed to R splenius cervicis/capitis at C5 level, R C5 multifidus, and bilateral upper trapezius with 0.25 x 40 single needle placements with local twitch response (LTR). Pistoning technique utilized. Moderate post-treatment soreness with most discomfort along L upper trap reported.     Mechanical Traction - for nerve root decompression and pain relief; x 10 minutes with 19 lbs with static hold using Saunder's Unit on treatment table                          -symptom are abolished with use of mechanical traction today     Therapeutic Exercise - for shoulder ROM as needed for reaching and grasping tasks, self-care ADLs, and household chores   We checked shoulder AROM with pt exhibiting moderate end-range R shoulder flexion AROM deficit and patient reports pain with active elevation beyond shoulder height in sagittal and frontal plane post-treatment today     *next visit*  Bilateral ER with Tband; tactile cueing for scapular retraction and depression; 2x10, 3 sec hold; Green Tband   *not today* Repeated cervical retraction, in supine; 2x10 Serratus slide, at wall on foam roller; 2x8, 5 sec hold at top; with yellow Tband  Supine cervical sidebend stretch; 3x20 sec, R and L UE ergometer 2 minutes forward and 2 minutes backwards for muscle extensibility  - subjective obtained during this time  intermittently, 1 minute not billed   Rhythmic stabilization at 100 deg flexion; 1 x 3 minutes with multidirectional perturbations in static position   Sidelying ER; 2x10, with 3-lb Dbell  -verbal and tactile cueing for scap retraction  Standing ER with Red Tband, towel roll under arm; 2x10  Standing scaption with Red Tband, one end anchored under foot; 2x10   -verbal cueing and demo  for upright posture and avoidance of scapular hiking compensation Standing Tband row; Blue Tband; 2x10  Wall slide, abduction; x10 with 5 sec hold Shoulder wand flexion with 5-lb cuff weight for gravity-assisted stretch into end-range flexion; 2x10 Standing IR step-outs for isometric contraction with Red Tband; 2x10 (3 lateral steps with tension)  - min verbal cueing for technique Sidelying shoulder abduction; 1x10, ROM as tolerated  Wand ER in standing, 90 deg abduction with arm propped on railing; 1x10, 3 sec Wall slide, flexion and abduction; x10 ea dir, 5 sec hold Sidelying ER; 2x10, 3-lb Dbell -verbal and tactile cueing for technique Standing Tband row, Blue Tband; 2x12 Finger ladder, x10, 5 sec hold at top for forward flexion  Supine flexion AROM; 2x10 Pulleys; shoulder complex abduction ; x 2 minutes Wand ER AAROM; 2x10 Table slide PROM Flexion x15 Table slide PROM Abd x15 Gripping; pink Theraputty; composite fist, performed x 2 minutes Wrist AROM; flexion/extension, RD/UD, pronation/supination; reviewed for HEP  Cold pack (unbilled) - for anti-inflammatory and analgesic effect as needed for reduced pain and improved ability to participate in active PT intervention, along R shoulder in sitting, pillow propped under R forearm for upper limb support, x 5 minutes    PATIENT EDUCATION:  Education details: see above for patient education details Person educated: Patient Education method: Explanation Education comprehension: verbalized understanding     HOME EXERCISE PROGRAM: SHOULDER HEP Access  Code: [email or web address]  CERVICAL SPINE HEP Access Code: [email or web address] URL: [email or web address] Date: 01/20/2023 Prepared by: Consuela Mimes  Exercises - Supine Chin Tuck  - 5-6 x daily - 7 x weekly - 1 sets - 10 reps - 1sec hold - Seated Self Cervical Traction  - 2 x daily - 7 x weekly - 10-20 reps - 10sec hold - Supine Cervical Sidebending Stretch  - 2 x daily - 7 x weekly - 3 sets - 30sec hold      ASSESSMENT:   CLINICAL IMPRESSION: Patient presents with Neck, shoulder and C/S pain with radicular pain and numbness in L 1st finger and thumb.  Pt has limited L at flexion, rotation L>R with pain, He has normal cervical spine flexion without significant pain reproduction. No ext 2/2 to Possible C/S stenosis. Pt introduced to Scapular exercises and cervical traction unit for home use. Pt will bring it to the clinic to become safe and important with its use. Pt demonstrated good understanding. Pt demonstrated improved ROM and pain in R shldr and Neck. Patient will benefit from continued skilled PT to address above impairments and improve overall function.   REHAB POTENTIAL: Good   CLINICAL DECISION MAKING: Stable/uncomplicated   EVALUATION COMPLEXITY: Low     GOALS: Goals reviewed with patient? Yes   SHORT TERM GOALS: Target date: 09/14/2022   Pt will be independent with HEP to minimize adverse effects of immobilization and gently mobilize R shoulder to improve pain-free function at home and work. Baseline: 08/17/22: Baseline HEP initiated.   11/17/22:  Pt is compliant with HEP and verbalizes understanding of given exercises Goal status: ACHIEVED     LONG TERM GOALS: Target date: 12/07/22   Pt will increase FOTO (for shoulder) to at least 56 to demonstrate significant improvement in function at home and work related to neck pain  Baseline: 08/17/22: 4.   11/17/22: 61/56 Goal status: ACHIEVED   2.  Pt will decrease worst shoulder pain by at least 3 points on the NPRS in order  to demonstrate clinically significant reduction in shoulder pain. Baseline: 08/17/22: 5-6/10  at worst over last week.   09/23/22: 2/10.   11/17/22: 2-3/10 at worst.    12/08/22: 2/10.    12/23/22: 2-3/10  Goal status: IN PROGRESS    3.   Pt will have R shoulder AROM at least within 10 degrees of contralateral upper extremity indicative of improved ROM as needed for reaching, overhead work, self-care activities/dressing/grooming Baseline: 08/17/22: NO AROM presently, severely limited PROM in early post-op phase.  09/23/22: AROM not appropriate yet at this point post-operatively   11/17/22: Motion loss remains in all planes of motion.  12/08/22: met for flexion and IR, not met for ER and ABD.    01/19/23: Met for all except flexion AROM tested in standing.  Goal status: MOSTLY MET    4.   Pt will have R shoulder MMTs at 4+/5 or greater for all directions performed indicative of improved strength as needed for completion of daily lifting, reaching, carrying tasks (e.g. 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  09/23/22: still deferred at this point post-operatively.   11/17/22: MMT for R shoulder 3+ to 4 (see chart above).    12/08/22: Not met for flexion, ABD, ER.    01/19/23: Not met for flexion, ABD, ER.  Goal status: ON-GOING   5.   Pt will perform unilateral farmer's carry with 15 lbs for 100 ft without reproduction of pain simulating carrying items in [employer] including carrying trash bag  Baseline: 08/17/22: Unable to perform lifting/carrying.  09/23/22: unable.   11/17/22: Deferred to later date.    12/08/22: Deferred due to remaining weakness.  Goal status: DEFERRED   6.  Pt will decrease worst neck pain by at least 2 points on the NPRS in order to demonstrate clinically significant reduction in neck pain. Baseline: 01/19/23: Pain 8/10 at worst Goal status: INITIAL  7. Pt will increase FOTO (for neck) to at least 56 to  demonstrate significant improvement in function at home and work related to neck pain  Baseline: 01/18/23: FOTO for neck created, intake survey to be completed next visit.   Goal status: INITIAL  7. Pt will improve cervical spine rotation and extension to functional ROM as needed for scanning environment without pain reproduction Baseline: 01/18/23: Cervical spine rotation R 51 deg, L 40 deg; extension 32 deg. Goal status: INITIAL    PLAN: PT FREQUENCY: 2x/week   PT DURATION: 6 weeks   PLANNED INTERVENTIONS: Therapeutic exercises, Therapeutic activity, Neuromuscular re-education, Balance training, Gait training, Patient/Family education, Joint mobilization, Electrical stimulation, Cryotherapy, Moist heat, Traction, Dry Needling, and Manual therapy   PLAN FOR NEXT SESSION: Continue with advanced phase rehab for R shoulder s/p RTC repair with focus on restoring full ROM and progressive RTC strengthening with advancing HEP. Focus manual therapy on cervicalgia; traction, STM, consider dry needling. Repeated movement/MDT to reduce C-spine referred pain.     Patient Details  Name: DAJON LAZAR MRN: [id number] Date of Birth: 1956/01/31 Referring Provider:  Juanell Fairly, MD  Janet Berlin PT DPT 3:56 PM,02/08/23  [facility] [address] [place], [place], [address] Phone: [phone]   Fax:  [phone]

## 2023-02-11 ENCOUNTER — Ambulatory Visit: Payer: Medicare Other

## 2023-02-11 DIAGNOSIS — M25611 Stiffness of right shoulder, not elsewhere classified: Secondary | ICD-10-CM

## 2023-02-11 DIAGNOSIS — M6281 Muscle weakness (generalized): Secondary | ICD-10-CM

## 2023-02-11 DIAGNOSIS — M25511 Pain in right shoulder: Secondary | ICD-10-CM

## 2023-02-11 DIAGNOSIS — M542 Cervicalgia: Secondary | ICD-10-CM

## 2023-02-11 NOTE — Therapy (Signed)
OUTPATIENT PHYSICAL THERAPY TREATMENT    Patient Name: Shawn Atkins MRN: [id number] DOB:[birth year], 67 y.o., male Today's Date: 02/08/2023  END OF SESSION:   PT End of Session - 02/08/23 1552     Visit Number 39    Number of Visits 42    Date for PT Re-Evaluation 01/14/23    Progress Note Due on Visit 40    PT Start Time 1033    PT Stop Time 1116    PT Time Calculation (min) 43 min    Activity Tolerance Patient tolerated treatment well    Behavior During Therapy WFL for tasks assessed/performed               Past Medical History:  Diagnosis Date   BPH (benign prostatic hypertrophy)    Bronchitis    Chronic prostatitis    Complication of anesthesia    Felt like "couldn't breathe" after triple Hernia surgery   Diabetes mellitus without complication    Flank pain    GERD (gastroesophageal reflux disease)    h/o   Gross hematuria    History of kidney stones    HTN (hypertension)    pt states he takes lisinopril for kidney protection due to dm not htn   Inguinal hernia    Nocturia    OSA on CPAP    with 02 2 L   Overweight    PONV (postoperative nausea and vomiting)    only during kidney stone surgery   Spermatocele    Stricture of urethra    Vertigo    1 episode, 6-7 yrs ago   Past Surgical History:  Procedure Laterality Date   BICEPT TENODESIS Right 08/06/2022   Procedure: BICEPS TENODESIS;  Surgeon: Juanell Fairly, MD;  Location: [facility];  Service: Orthopedics;  Laterality: Right;   CATARACT EXTRACTION W/PHACO Left 07/22/2020   Procedure: CATARACT EXTRACTION PHACO AND INTRAOCULAR LENS PLACEMENT (IOC) LEFT DIABETIC 4.14  00:33.0;  Surgeon: Nevada Crane, MD;  Location: [facility];  Service: Ophthalmology;  Laterality: Left;  Diabetic - oral meds   CATARACT EXTRACTION W/PHACO Right 08/12/2020   Procedure: CATARACT EXTRACTION PHACO AND INTRAOCULAR LENS PLACEMENT (IOC) RIGHT DIABETIC;  Surgeon: Nevada Crane, MD;  Location: [facility];  Service: Ophthalmology;  Laterality: Right;  1.99 0:26.2   COLONOSCOPY WITH PROPOFOL N/A 11/20/2015   Procedure: COLONOSCOPY WITH PROPOFOL;  Surgeon: Kieth Brightly, MD;  Location: [facility];  Service: Endoscopy;  Laterality: N/A;   HERNIA REPAIR Bilateral 2014   umbilical and bil inguinal/ Dr Egbert Garibaldi   KIDNEY STONE SURGERY     KNEE ARTHROSCOPY Left    open lithotomy     SHOULDER ARTHROSCOPY WITH OPEN ROTATOR CUFF REPAIR AND DISTAL CLAVICLE ACROMINECTOMY Right 08/06/2022   Procedure: SHOULDER ARTHROSCOPY WITH OPEN ROTATOR CUFF REPAIR AND DISTAL CLAVICLE ACROMINECTOMY;  Surgeon: Juanell Fairly, MD;  Location: [facility];  Service: Orthopedics;  Laterality: Right;   Patient Active Problem List   Diagnosis Date Noted   History of rectal bleeding 06/01/2018   Left groin pain 03/18/2018   Sacroiliac joint pain 01/13/2017   Abdominal pain, left lower quadrant 08/23/2015   BPH with obstruction/lower urinary tract symptoms 08/23/2015   Biceps tendinitis 06/25/2015     PCP: Dortha Kern, MD REFERRING PROVIDER: Juanell Fairly, MD     Drake Leach, PA-C  REFERRING DIAG: M75.101 Unspecified rotator cuff tear or rupture of right shoulder, not specified as traumatic M47.812 (ICD-10-CM) - Cervical spondylosis  M54.12 (ICD-10-CM) -  Cervical radiculopathy    THERAPY DIAG:  Acute pain of right shoulder  Cervicalgia  Stiffness of right shoulder, not elsewhere classified  Muscle weakness (generalized)  Rationale for Evaluation and Treatment Rehabilitation   PERTINENT HISTORY: Pt is a 67 year old male s/p R shoulder rotator cuff repair and distal clavicle acromionectomy with Dr. Martha Clan on 08/06/22. Patient reports that his surgeon informed him he did have full tear of rotator cuff in surgery. Patient reports 4 days of severe pain just out of surgery. Patient reports managing pain well with Tylenol/Ibuprofen well now. Pt followed up with [facility] for erythema and  increased temperature as well as notable edema; ruled out infection and DVT. Patient reports comorbid neck and R upper trap pain. Pt reports difficulty with sleep positioning due to comorbid neck pain.    Pain:  Pain Intensity: Present: 2-3/10, Best: 2/10, Worst: 5-6/10 Pain location: R shoulder along deltoid and biceps Pain Quality: steady pain, intense intermittently; sharp pain along ACJ that comes and goes Radiating: Yes , to anterior arm Numbness/Tingling: No Aggravating factors: moving his R arm Relieving factors: Ibuprofen, Tylenol, resting arm/arm propped on armrest  History of prior shoulder or neck/shoulder injury, pain, surgery, or therapy: Yes, L shoulder with hx of cortisone injections, Hx of neck injury/PT for his neck Falls: Has patient fallen in last 6 months? No Dominant hand: left Imaging: Yes , MRI pre-operatively Prior level of function: Independent Occupational demands: Pt works part-time with [employer] (20 hrs/week), pt out of work now on medical leave Hobbies: [employer] (personal history of cancer, chills/fever, night sweats, nausea, vomiting, unrelenting pain): Negative   Precautions: None   Weight Bearing Restrictions: No   Living Environment Lives with: lives with their spouse Lives in: House/apartment     Patient Goals: Able to pick up weight with his arm, fish; able to play with baseball/bat with his grandson    PRECAUTIONS: No R shoulder AROM, no RUE lifting    DOS: 08/06/22, s/p R large RTC repair, R distal acromionectomy -8 weeks post-op 10/01/22 -12 weeks post-op 10/29/22    SUBJECTIVE:                                                                                                                                                                                      SUBJECTIVE STATEMENT:  Pt reports having rough morning with rainy/inclement weather. He reports some swelling along upper traps after last visit that resolved the next day. Patient  reports pain along paracervical and upper trap region. Patient reports compliance with HEP. He feels that DN did help with pain following last visit. He wants to defer another trial of DN until  future visit.   PAIN:  Are you having pain? Yes, 2/10 pain at arrival to PT  Pain location: pain into R paracervical/upper trap region mainly     OBJECTIVE: (objective measures completed at initial evaluation unless otherwise dated)     Patient Surveys  FOTO: 4, predicted outcome score of 56     Cognition Patient is oriented to person, place, and time.  Recent memory is intact.  Remote memory is intact.  Attention span and concentration are intact.  Expressive speech is intact.  Patient's fund of knowledge is within normal limits for educational level.                          Gross Musculoskeletal Assessment Tremor: None Bulk: Atrophy of deltoid/posterior cuff mm Tone: Normal   Observation No sign of acute infection, mild erythema along R upper arm, mild increased tissue temperature along R middle deltoid     Posture FHRS posture     AROM               AROM (Normal range in degrees) AROM 08/17/2022 AROM 11/17/22 AROM 12/08/22 AROM 12/23/22 AROM 01/19/23    Right Left Right Left Right Left Right Left Right Left  Shoulder              Flexion Deferred   125 155 141 155 143 157 142 161  Extension Deferred            Abduction Deferred   128 165 147 165 155 WNL 155 WNL  External Rotation Deferred   80 WNL 87  WNL 86 WNL 90 WNL  Internal Rotation Deferred   50 WNL 70 WNL 57 WNL WNL WNL  Hands Behind Head Deferred   C7 T3 T2 T4 T2 T5 T3 T5  Hands Behind Back Deferred   L3 T12 L2 T12 L4 T7 T12 T10                 Elbow              Flexion WNL            Extension WNL            Pronation 50            Supination 60            (* = pain; Blank rows = not tested)     PROM         PROM (Normal range in degrees) PROM 08/17/2022 PROM 11/17/22 PROM 12/08/22    Right Left  Right Left Right Left  Shoulder          Flexion 40    160 157 166  Extension          Abduction     108 WNL 111   External Rotation (arm at side) To neutral/0 deg    WNL ER at 90 deg ABD: 60 deg   Internal Rotation (arm at side) 45    WNL IR at 90 degr ABD: 70 deg         UE MMT:  MMT (out of 5) Right 11/17/22 Left 11/17/22 Right 12/08/22 Right  12/23/22 Right 01/19/23 Left 01/19/23             Shoulder       Flexion 3+  5  4- 4 4 5   Extension          Abduction 4-  4+ 4- 4 4* 4+  External rotation  4-  4+ 4 4 4* 4+  Internal rotation  4 5  4+ 5- 5 5  Horizontal abduction          Horizontal adduction          Lower Trapezius          Rhomboids                     Elbow      Flexion  4+ 5 5 5 5 5   Extension  4+  5 5 5  4+ 5  Pronation          Supination                     Wrist      Flexion       5 5  Extension       4 5  Radial deviation          Ulnar deviation          Finger ABD     WNL WNL  (* = pain; Blank rows = not tested)      Palpation   Location LEFT  RIGHT           Subocciptials      Cervical paraspinals      Upper Trapezius      Levator Scapulae      Rhomboid Major/Minor      Sternoclavicular joint      Acromioclavicular joint   2  Coracoid process   1  Long head of biceps   1  Supraspinatus   2  Infraspinatus   1  Subscapularis      Teres Minor      Teres Major      Pectoralis Major      Pectoralis Minor      Anterior Deltoid   1  Lateral Deltoid   2  Posterior Deltoid      Latissimus Dorsi      Sternocleidomastoid      (Blank rows = not tested) Graded on 0-4 scale (0 = no pain, 1 = pain, 2 = pain with wincing/grimacing/flinching, 3 = pain with withdrawal, 4 = unwilling to allow palpation), (Blank rows = not tested)    CERVICAL SPINE ASSESSMENT 01/19/23  AROM AROM (Normal range in degrees) AROM 01/19/23  Cervical  Flexion (50) 40  Extension (80) 32*  Right lateral flexion (45) 31 (pull on L)   Left lateral flexion (45) 20  (pull on R)  Right rotation (85) 51*  Left rotation (85) 40*  (* = pain; Blank rows = not tested)    Sensation Grossly intact to light touch bilateral UE as determined by testing dermatomes C2-T2. Proprioception and hot/cold testing deferred on this date.  Reflexes R/L Elbow: 2+/2+  Brachioradialis: 2+/2+  Tricep: 1+/1+  Repeated Movements Repeated cervical retraction in supine: pressure in mid-lower C-spine during, no worse after     SPECIAL TESTS Spurlings A (ipsilateral lateral flexion/axial compression): R: Positive L: Negative Distraction Test: Positive  Hoffman Sign (cervical cord compression): R: Negative L: Negative ULTT Median: R: Negative L: Negative    Location LEFT  RIGHT           Suboccipitals 0 0  Cervical paraspinals 0 2 (referred to R shoulder)  Upper Trapezius 0 2  Levator Scapulae 0 1  Rhomboid Major/Minor    (Blank rows = not  tested) Graded on 0-4 scale (0 = no pain, 1 = pain, 2 = pain with wincing/grimacing/flinching, 3 = pain with withdrawal, 4 = unwilling to allow palpation), (Blank rows = not tested)   PASSIVE ACCESSORY MOTION Cervical spine: Hypomobile CPA at C5-7 and CT junction, decreased sideglide R to L worse than L to R at C4-C6     TODAY'S TREATMENT 02/11/2023 Not TODAY  Manual Therapy :   manual traction to C/S, PROM to C/S in all planes, Cranial release, Pectoralis minor stretch. PROM to [facility] joint, scapular mobs.   Therapeutic exs: 20 min UBE 3/3 For/Back with MH.  Chin tucks 2x 60 secs Serratus Ant punches #2 2 x 60 secs Scapular retraction 2 x 60 secs Shldr shrugs 2 x 60 secs #4   Therapeutic activities: Pt education regarding his  C/S condition and symptoms including numbness and tingling. Advised pt to purchase a home C/S traction to promote pain relief and ROM.    *Update for passive accessory motion in objective section  Cervical sideglides; R to L, and L to R; 2 x 30 sec at C3-6   *not today* R shoulder  PROM flexion, abduction, ER/IR within pt tolerance with gentle overpressure as able x 5 minutes DTM/TPR bilat  UT; x 3 minutes -applied Biofreeze to R paracervical musculature and R upper trapezius for analgesic effect Gentle STM to R anterior and middle deltoid, biceps, upper trapezius; x 2 minutes DTM/Tpr R upper trapezius; x 8 minutes Trigger Point Dry Needling (TDN), unbilled Education performed with patient regarding potential benefit of TDN. Reviewed precautions and risks with patient. Extensive time spent with pt to ensure full understanding of TDN risks. Pt provided verbal consent to treatment. TDN performed to R splenius cervicis/capitis at C5 level, R C5 multifidus, and bilateral upper trapezius with 0.25 x 40 single needle placements with local twitch response (LTR). Pistoning technique utilized. Moderate post-treatment soreness with most discomfort along L upper trap reported.     Mechanical Traction - for nerve root decompression and pain relief; x 10 minutes with 19 lbs with static hold using Saunder's Unit on treatment table                          -symptom are abolished with use of mechanical traction today     Therapeutic Exercise - for shoulder ROM as needed for reaching and grasping tasks, self-care ADLs, and household chores   We checked shoulder AROM with pt exhibiting moderate end-range R shoulder flexion AROM deficit and patient reports pain with active elevation beyond shoulder height in sagittal and frontal plane post-treatment today     *next visit*  Bilateral ER with Tband; tactile cueing for scapular retraction and depression; 2x10, 3 sec hold; Green Tband   *not today* Repeated cervical retraction, in supine; 2x10 Serratus slide, at wall on foam roller; 2x8, 5 sec hold at top; with yellow Tband  Supine cervical sidebend stretch; 3x20 sec, R and L UE ergometer 2 minutes forward and 2 minutes backwards for muscle extensibility  - subjective obtained during  this time intermittently, 1 minute not billed   Rhythmic stabilization at 100 deg flexion; 1 x 3 minutes with multidirectional perturbations in static position   Sidelying ER; 2x10, with 3-lb Dbell  -verbal and tactile cueing for scap retraction  Standing ER with Red Tband, towel roll under arm; 2x10  Standing scaption with Red Tband, one end anchored under foot; 2x10   -verbal cueing  and demo for upright posture and avoidance of scapular hiking compensation Standing Tband row; Blue Tband; 2x10  Wall slide, abduction; x10 with 5 sec hold Shoulder wand flexion with 5-lb cuff weight for gravity-assisted stretch into end-range flexion; 2x10 Standing IR step-outs for isometric contraction with Red Tband; 2x10 (3 lateral steps with tension)  - min verbal cueing for technique Sidelying shoulder abduction; 1x10, ROM as tolerated  Wand ER in standing, 90 deg abduction with arm propped on railing; 1x10, 3 sec Wall slide, flexion and abduction; x10 ea dir, 5 sec hold Sidelying ER; 2x10, 3-lb Dbell -verbal and tactile cueing for technique Standing Tband row, Blue Tband; 2x12 Finger ladder, x10, 5 sec hold at top for forward flexion  Supine flexion AROM; 2x10 Pulleys; shoulder complex abduction ; x 2 minutes Wand ER AAROM; 2x10 Table slide PROM Flexion x15 Table slide PROM Abd x15 Gripping; pink Theraputty; composite fist, performed x 2 minutes Wrist AROM; flexion/extension, RD/UD, pronation/supination; reviewed for HEP  Cold pack (unbilled) - for anti-inflammatory and analgesic effect as needed for reduced pain and improved ability to participate in active PT intervention, along R shoulder in sitting, pillow propped under R forearm for upper limb support, x 5 minutes    PATIENT EDUCATION:  Education details: see above for patient education details Person educated: Patient Education method: Explanation Education comprehension: verbalized understanding     HOME EXERCISE PROGRAM: SHOULDER  HEP Access Code: [email or web address]  CERVICAL SPINE HEP Access Code: [email or web address] URL: [email or web address] Date: 01/20/2023 Prepared by: Consuela Mimes  Exercises - Supine Chin Tuck  - 5-6 x daily - 7 x weekly - 1 sets - 10 reps - 1sec hold - Seated Self Cervical Traction  - 2 x daily - 7 x weekly - 10-20 reps - 10sec hold - Supine Cervical Sidebending Stretch  - 2 x daily - 7 x weekly - 3 sets - 30sec hold      ASSESSMENT:   CLINICAL IMPRESSION: Today, Patient presents with continued  Neck, shoulder and C/S pain with radicular pain and numbness in L 1st finger and thumb. Pt reports that his neck and shoulder are not improving since the therapy has begun and therefore he consulted his Surgeon. Pt requested to continue therapy after MRI and MD assessment. PT educated pt about rehab progress and his R shldr and C/S condition.  Patient will benefit from continued skilled PT to address above impairments and improve overall function after receiving OK from MD. Pt agreed with this and will return after Seeing Dr. Martha Clan.    REHAB POTENTIAL: Good   CLINICAL DECISION MAKING: Stable/uncomplicated   EVALUATION COMPLEXITY: Low     GOALS: Goals reviewed with patient? Yes   SHORT TERM GOALS: Target date: 09/14/2022   Pt will be independent with HEP to minimize adverse effects of immobilization and gently mobilize R shoulder to improve pain-free function at home and work. Baseline: 08/17/22: Baseline HEP initiated.   11/17/22:  Pt is compliant with HEP and verbalizes understanding of given exercises Goal status: ACHIEVED     LONG TERM GOALS: Target date: 12/07/22   Pt will increase FOTO (for shoulder) to at least 56 to demonstrate significant improvement in function at home and work related to neck pain  Baseline: 08/17/22: 4.   11/17/22: 61/56 Goal status: ACHIEVED   2.  Pt will decrease worst shoulder pain by at least 3 points on the NPRS in order to demonstrate clinically  significant reduction in shoulder pain. Baseline: 08/17/22: 5-6/10 at worst over  last week.   09/23/22: 2/10.   11/17/22: 2-3/10 at worst.    12/08/22: 2/10.    12/23/22: 2-3/10  Goal status: IN PROGRESS    3.   Pt will have R shoulder AROM at least within 10 degrees of contralateral upper extremity indicative of improved ROM as needed for reaching, overhead work, self-care activities/dressing/grooming Baseline: 08/17/22: NO AROM presently, severely limited PROM in early post-op phase.  09/23/22: AROM not appropriate yet at this point post-operatively   11/17/22: Motion loss remains in all planes of motion.  12/08/22: met for flexion and IR, not met for ER and ABD.    01/19/23: Met for all except flexion AROM tested in standing.  Goal status: MOSTLY MET    4.   Pt will have R shoulder MMTs at 4+/5 or greater for all directions performed indicative of improved strength as needed for completion of daily lifting, reaching, carrying tasks (e.g. 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  09/23/22: still deferred at this point post-operatively.   11/17/22: MMT for R shoulder 3+ to 4 (see chart above).    12/08/22: Not met for flexion, ABD, ER.    01/19/23: Not met for flexion, ABD, ER.  Goal status: ON-GOING   5.   Pt will perform unilateral farmer's carry with 15 lbs for 100 ft without reproduction of pain simulating carrying items in [employer] including carrying trash bag  Baseline: 08/17/22: Unable to perform lifting/carrying.  09/23/22: unable.   11/17/22: Deferred to later date.    12/08/22: Deferred due to remaining weakness.  Goal status: DEFERRED   6.  Pt will decrease worst neck pain by at least 2 points on the NPRS in order to demonstrate clinically significant reduction in neck pain. Baseline: 01/19/23: Pain 8/10 at worst Goal status: INITIAL  7. Pt will increase FOTO (for neck) to at least 56 to demonstrate significant  improvement in function at home and work related to neck pain  Baseline: 01/18/23: FOTO for neck created, intake survey to be completed next visit.   Goal status: INITIAL  7. Pt will improve cervical spine rotation and extension to functional ROM as needed for scanning environment without pain reproduction Baseline: 01/18/23: Cervical spine rotation R 51 deg, L 40 deg; extension 32 deg. Goal status: INITIAL    PLAN: PT FREQUENCY: 2x/week   PT DURATION: 6 weeks   PLANNED INTERVENTIONS: Therapeutic exercises, Therapeutic activity, Neuromuscular re-education, Balance training, Gait training, Patient/Family education, Joint mobilization, Electrical stimulation, Cryotherapy, Moist heat, Traction, Dry Needling, and Manual therapy   PLAN FOR NEXT SESSION: Continue with advanced phase rehab for R shoulder s/p RTC repair with focus on restoring full ROM and progressive RTC strengthening with advancing HEP. Focus manual therapy on cervicalgia; traction, STM, consider dry needling. Repeated movement/MDT to reduce C-spine referred pain.     Patient Details  Name: Shawn Atkins MRN: [id number] Date of Birth: [birth year] Referring Provider:  Juanell Fairly, MD  Janet Berlin PT DPT 4:39 PM,02/11/23  [facility] [address] [place], [place], [address] Phone: [phone]   Fax:  [phone]

## 2023-02-15 ENCOUNTER — Ambulatory Visit: Payer: Medicare Other | Admitting: Physical Therapy

## 2023-02-17 ENCOUNTER — Telehealth: Payer: Self-pay

## 2023-02-17 ENCOUNTER — Ambulatory Visit: Payer: Medicare Other | Admitting: Physical Therapy

## 2023-02-17 NOTE — Telephone Encounter (Signed)
Previously saw Shawn Atkins on 01/18/23. A1c on 02/03/23 has decreased to 8.7 (result scanned in media). Per Shawn Atkins's previous discussion with him, needs to be 7.5 or less to have surgery. He is on Dr Lucienne Capers clinic schedule 03/16/23. Please cancel the appointment with Dr Myer Haff. He can work with his PCP regarding the A1c and have a copy of the results sent to [place]. He can reschedule with Dr Myer Haff when his A1c is 7.5 or less.  If he still wants to be seen for a follow up at the end of May, Shawn Atkins said he can have a follow up with her. Thanks.

## 2023-02-17 NOTE — Telephone Encounter (Signed)
I explained to the patient that we would like to postpone his appt until he reaches his A1c goal of 7.5. he agreed and said that he is working really hard on getting it down.

## 2023-02-19 ENCOUNTER — Telehealth: Payer: Self-pay

## 2023-02-19 NOTE — Telephone Encounter (Signed)
-----   Message from [name], MD sent at 02/19/2023  3:54 PM EDT ----- [name]-  Dr. Martha Clan asked me to see this patient.  Can you reschedule him to May 21 or 28?  Shawn Atkins

## 2023-02-22 NOTE — Telephone Encounter (Signed)
Patient confirmed appt for 03/16/2023

## 2023-03-15 NOTE — Progress Notes (Unsigned)
Referring Physician:  No referring provider defined for this encounter.  Primary Physician:  Dortha Kern, MD  History of Present Illness: 03/16/2023 [name] is here today with a chief complaint of neck pain that radiates down the right arm and into the hand, he also has numbness, tingling and weakness in the hand.   He began having symptoms after a right shoulder arthroscopy in October 2023.  He has pain into his right shoulder and numbness and tingling into the right lateral forearm and first and second digit of his right hand.  He has some difficulty with lifting his right arm up and has pain around the shoulder when he does this.  His discomfort can be as bad as 9 out of 10 and is sharp and stabbing.  Prolonged sitting and moving his neck make it worse.  He has significant neck pain.  Medication has helped.  He has had difficulty controlled diabetes and had his most recent A1c of 8.7%.  The previous epidural steroid injection made his sugars go up into the 500s.Bowel/Bladder Dysfunction: none  Conservative measures: massage, heat, ice Physical therapy: has participated in 5 visits at [facility], but has not been discharged.  Multimodal medical therapy including regular antiinflammatories: flexeril, biofreeeze Injections:  has received epidural steroid injections 10/28/22:C7-T1 IL ESI at [facility] (60% relief)  Past Surgery: denies  Shawn Atkins has no symptoms of cervical myelopathy.  The symptoms are causing a significant impact on the patient's life.   Progress Note from Drake Leach, [place] on 01/18/23:   History of Present Illness: 01/18/2023 [name] has a history of BPH and DM.    He is s/p right shoulder surgery in October with Dr. Martha Clan. He was seeing Dr. Noralyn Pick and had cervical ESI. ACDF C5-C6 discussed and he was to get HgbA1c below 7.5. No further ESIs recommended as BS went to 560 with injection per patient.    He has constant neck pain that radiates into  right hand to thump and sometimes to his right small finger. He has numbness, tingling, and weakness in right hand. Pain is worse with prolonged sitting and moving his neck. Some relief with motrin and OTC biofreeze.    His last HgbA1c was 10.5 on 11/04/22.    I have utilized the care everywhere function in epic to review the outside records available from external health systems.  Review of Systems:  A 10 point review of systems is negative, except for the pertinent positives and negatives detailed in the HPI.  Past Medical History: Past Medical History:  Diagnosis Date   BPH (benign prostatic hypertrophy)    Bronchitis    Chronic prostatitis    Complication of anesthesia    Felt like "couldn't breathe" after triple Hernia surgery   Diabetes mellitus without complication (HCC)    Flank pain    GERD (gastroesophageal reflux disease)    h/o   Gross hematuria    History of kidney stones    HTN (hypertension)    pt states he takes lisinopril for kidney protection due to dm not htn   Inguinal hernia    Nocturia    OSA on CPAP    with 02 2 L   Overweight    PONV (postoperative nausea and vomiting)    only during kidney stone surgery   Spermatocele    Stricture of urethra    Vertigo    1 episode, 6-7 yrs ago    Past Surgical History:  Past Surgical History:  Procedure Laterality Date   BICEPT TENODESIS Right 08/06/2022   Procedure: BICEPS TENODESIS;  Surgeon: Juanell Fairly, MD;  Location: [facility];  Service: Orthopedics;  Laterality: Right;   CATARACT EXTRACTION W/PHACO Left 07/22/2020   Procedure: CATARACT EXTRACTION PHACO AND INTRAOCULAR LENS PLACEMENT (IOC) LEFT DIABETIC 4.14  00:33.0;  Surgeon: Nevada Crane, MD;  Location: [facility];  Service: Ophthalmology;  Laterality: Left;  Diabetic - oral meds   CATARACT EXTRACTION W/PHACO Right 08/12/2020   Procedure: CATARACT EXTRACTION PHACO AND INTRAOCULAR LENS PLACEMENT (IOC) RIGHT DIABETIC;  Surgeon: Nevada Crane, MD;  Location: [facility];  Service: Ophthalmology;  Laterality: Right;  1.99 0:26.2   COLONOSCOPY WITH PROPOFOL N/A 11/20/2015   Procedure: COLONOSCOPY WITH PROPOFOL;  Surgeon: Kieth Brightly, MD;  Location: [facility];  Service: Endoscopy;  Laterality: N/A;   HERNIA REPAIR Bilateral 2014   umbilical and bil inguinal/ Dr Egbert Garibaldi   KIDNEY STONE SURGERY     KNEE ARTHROSCOPY Left    open lithotomy     SHOULDER ARTHROSCOPY WITH OPEN ROTATOR CUFF REPAIR AND DISTAL CLAVICLE ACROMINECTOMY Right 08/06/2022   Procedure: SHOULDER ARTHROSCOPY WITH OPEN ROTATOR CUFF REPAIR AND DISTAL CLAVICLE ACROMINECTOMY;  Surgeon: Juanell Fairly, MD;  Location: [facility];  Service: Orthopedics;  Laterality: Right;    Allergies: Allergies as of 03/16/2023 - Review Complete 03/16/2023  Allergen Reaction Noted   Penicillins Anaphylaxis 06/14/2015   Morphine and codeine Nausea And Vomiting 06/14/2015    Medications:  Current Outpatient Medications:    albuterol (PROVENTIL) (2.5 MG/3ML) 0.083% nebulizer solution, Take 2.5 mg by nebulization every 6 (six) hours as needed for wheezing or shortness of breath (Last used in the winter of 2022)., Disp: , Rfl:    atorvastatin (LIPITOR) 20 MG tablet, Take 20 mg by mouth every evening., Disp: , Rfl:    BIOTIN PO, Take 1 tablet by mouth daily., Disp: , Rfl:    glucosamine-chondroitin 500-400 MG tablet, Take 1 tablet by mouth 2 (two) times daily., Disp: , Rfl:    lisinopril (ZESTRIL) 10 MG tablet, Take 10 mg by mouth every morning., Disp: , Rfl:    metFORMIN (GLUCOPHAGE-XR) 500 MG 24 hr tablet, Take 1,000 mg by mouth 2 (two) times daily with a meal., Disp: , Rfl:    Multiple Vitamin (MULTIVITAMIN) tablet, Take 1 tablet by mouth daily., Disp: , Rfl:    NEEDLE, DISP, 18 G (BD DISP NEEDLES) 18G X 1-1/2" MISC, 1 mg by Does not apply route every 14 (fourteen) days., Disp: 50 each, Rfl: 0   NEEDLE, DISP, 21 G (BD DISP NEEDLES) 21G X 1-1/2" MISC, 1 mg  by Does not apply route every 14 (fourteen) days., Disp: 50 each, Rfl: 0   pioglitazone (ACTOS) 15 MG tablet, Take 15 mg by mouth daily., Disp: , Rfl:    Syringe, Disposable, (2-3CC SYRINGE) 3 ML MISC, 1 mg by Does not apply route every 14 (fourteen) days., Disp: 25 each, Rfl: 3   tadalafil (CIALIS) 5 MG tablet, Take 1 tablet (5 mg total) by mouth every morning. BPH, Disp: 90 tablet, Rfl: 3   testosterone cypionate (DEPOTESTOSTERONE CYPIONATE) 200 MG/ML injection, INJECT 1 ML INTO MUSCLE EVERY 14 DAYS, Disp: 10 mL, Rfl: 0   OZEMPIC, 0.25 OR 0.5 MG/DOSE, 2 MG/3ML SOPN, Inject 0.5 mg into the skin once a week., Disp: , Rfl:   Social History: Social History   Tobacco Use   Smoking status: Former    Packs/day: 1.00  Years: 15.00    Additional pack years: 0.00    Total pack years: 15.00    Types: Cigarettes    Quit date: 75    Years since quitting: 39.4    Passive exposure: Past   Smokeless tobacco: Current    Types: Snuff   Tobacco comments:    Smokless and snuff  Vaping Use   Vaping Use: Never used  Substance Use Topics   Alcohol use: Yes    Alcohol/week: 1.0 standard drink of alcohol    Types: 1 Cans of beer per week    Comment: 3 beers daily   Drug use: No    Family Medical History: Family History  Problem Relation Age of Onset   Benign prostatic hyperplasia Father    Kidney disease Neg Hx    Prostate cancer Neg Hx     Physical Examination: Vitals:   03/16/23 1514  BP: 130/78    General: Patient is in no apparent distress. Attention to examination is appropriate.  Neck:   Supple.  Full range of motion.  Respiratory: Patient is breathing without any difficulty.   NEUROLOGICAL:     Awake, alert, oriented to person, place, and time.  Speech is clear and fluent.   Cranial Nerves: Pupils equal round and reactive to light.  Facial tone is symmetric.  Facial sensation is symmetric. Shoulder shrug is symmetric. Tongue protrusion is midline.  There is no pronator  drift.  Strength: Side Biceps Triceps Deltoid Interossei Grip Wrist Ext. Wrist Flex.  R 5 5 4 5 5 5 5   L 5 5 4+ 5 5 5 5    Side Iliopsoas Quads Hamstring PF DF EHL  R 5 5 5 5 5 5   L 5 5 5 5 5 5    Reflexes are 1+ and symmetric at the biceps, triceps, brachioradialis, patella and achilles.   Hoffman's is absent.   Bilateral upper and lower extremity sensation is intact to light touch with exception of right arm which shows altered sensation in the right C6 distribution on the dorsal aspect of his hand and in his forearm.  The palmar aspect of his right hand has preserved sensation on my examination.    No evidence of dysmetria noted.  Gait is normal.     Medical Decision Making  Imaging: MRI of the cervical spine from June 16, 2022 shows severe degenerative disc disease from C5-6 through C7-T1.  There is foraminal stenosis on the right side at C3-4 and at C7-T1 that is rated moderate.  There is a rightward disc protrusion at C5-6 which contour is the anterior surface of the right side of the spinal cord.  I have personally reviewed the images and agree with the above interpretation.  Assessment and Plan: Mr. Delanoy is a pleasant 67 y.o. male with new neurologic symptoms down his right arm that occurred after his right shoulder arthroscopy.  This is most consistent with right C6 distribution, but could be more involved.  He has some swelling in his supraclavicular fossa where he had an attempt at a block for his arthroscopy.  I have some concern that he may have some brachial plexus involvement of his symptoms.  His MRI scan shows a disc protrusion at C5-6, but no clear right C6 nerve root compression.  I would like to obtain a nerve conduction study.  If this shows active C6 nerve root involvement, I would like to repeat his MRI scan as he may have had a worsening disc herniation around the  time of his shoulder arthroscopy.  We will restart his physical therapy for his neck.  This may  help with his neck pain and stiffness.  After his nerve conduction study, I will see him back in clinic to discuss.  I spent a total of 30 minutes in this patient's care today. This time was spent reviewing pertinent records including imaging studies, obtaining and confirming history, performing a directed evaluation, formulating and discussing my recommendations, and documenting the visit within the medical record.      Thank you for involving me in the care of this patient.      Kolbie Lepkowski K. Myer Haff MD, [facility] Neurosurgery

## 2023-03-16 ENCOUNTER — Encounter: Payer: Self-pay | Admitting: Neurosurgery

## 2023-03-16 ENCOUNTER — Telehealth: Payer: Self-pay

## 2023-03-16 ENCOUNTER — Ambulatory Visit: Payer: Medicare Other | Admitting: Neurosurgery

## 2023-03-16 VITALS — BP 130/78 | Ht 69.0 in | Wt 205.0 lb

## 2023-03-16 DIAGNOSIS — M542 Cervicalgia: Secondary | ICD-10-CM

## 2023-03-16 DIAGNOSIS — M25511 Pain in right shoulder: Secondary | ICD-10-CM | POA: Diagnosis not present

## 2023-03-16 DIAGNOSIS — M5010 Cervical disc disorder with radiculopathy, unspecified cervical region: Secondary | ICD-10-CM

## 2023-03-16 DIAGNOSIS — G8929 Other chronic pain: Secondary | ICD-10-CM

## 2023-03-16 DIAGNOSIS — M5412 Radiculopathy, cervical region: Secondary | ICD-10-CM

## 2023-03-16 NOTE — Telephone Encounter (Signed)
Dr. Myer Haff has placed a order for a EMG. Can you please send the referral.

## 2023-03-17 NOTE — Telephone Encounter (Signed)
Referral faxed on 03/16/2023.

## 2023-03-24 NOTE — Telephone Encounter (Signed)
Appt with Dr.Shah on 05/24/2023.

## 2023-04-08 ENCOUNTER — Ambulatory Visit: Payer: Medicare Other | Attending: Neurosurgery | Admitting: Physical Therapy

## 2023-04-08 DIAGNOSIS — M5412 Radiculopathy, cervical region: Secondary | ICD-10-CM | POA: Diagnosis not present

## 2023-04-08 DIAGNOSIS — M6281 Muscle weakness (generalized): Secondary | ICD-10-CM | POA: Diagnosis present

## 2023-04-08 DIAGNOSIS — M542 Cervicalgia: Secondary | ICD-10-CM | POA: Diagnosis present

## 2023-04-08 DIAGNOSIS — G8929 Other chronic pain: Secondary | ICD-10-CM | POA: Diagnosis not present

## 2023-04-08 DIAGNOSIS — M25511 Pain in right shoulder: Secondary | ICD-10-CM | POA: Insufficient documentation

## 2023-04-08 NOTE — Therapy (Signed)
OUTPATIENT PHYSICAL THERAPY NECK EVALUATION   Patient Name: Shawn Atkins MRN: [id number] DOB:November 13, 1955, 67 y.o., male Today's Date: 04/08/2023   PT End of Session - 04/11/23 1341     Visit Number 1    Number of Visits 13    Date for PT Re-Evaluation 05/20/23    Authorization Type [employer] Medicare, VL based on medical necessity    Progress Note Due on Visit 10    PT Start Time 0945    PT Stop Time 1030    PT Time Calculation (min) 45 min    Activity Tolerance Patient tolerated treatment well    Behavior During Therapy WFL for tasks assessed/performed              Past Medical History:  Diagnosis Date   BPH (benign prostatic hypertrophy)    Bronchitis    Chronic prostatitis    Complication of anesthesia    Felt like "couldn't breathe" after triple Hernia surgery   Diabetes mellitus without complication (HCC)    Flank pain    GERD (gastroesophageal reflux disease)    h/o   Gross hematuria    History of kidney stones    HTN (hypertension)    pt states he takes lisinopril for kidney protection due to dm not htn   Inguinal hernia    Nocturia    OSA on CPAP    with 02 2 L   Overweight    PONV (postoperative nausea and vomiting)    only during kidney stone surgery   Spermatocele    Stricture of urethra    Vertigo    1 episode, 6-7 yrs ago   Past Surgical History:  Procedure Laterality Date   BICEPT TENODESIS Right 08/06/2022   Procedure: BICEPS TENODESIS;  Surgeon: Juanell Fairly, MD;  Location: [facility];  Service: Orthopedics;  Laterality: Right;   CATARACT EXTRACTION W/PHACO Left 07/22/2020   Procedure: CATARACT EXTRACTION PHACO AND INTRAOCULAR LENS PLACEMENT (IOC) LEFT DIABETIC 4.14  00:33.0;  Surgeon: Nevada Crane, MD;  Location: [facility];  Service: Ophthalmology;  Laterality: Left;  Diabetic - oral meds   CATARACT EXTRACTION W/PHACO Right 08/12/2020   Procedure: CATARACT EXTRACTION PHACO AND INTRAOCULAR LENS PLACEMENT (IOC) RIGHT DIABETIC;   Surgeon: Nevada Crane, MD;  Location: [facility];  Service: Ophthalmology;  Laterality: Right;  1.99 0:26.2   COLONOSCOPY WITH PROPOFOL N/A 11/20/2015   Procedure: COLONOSCOPY WITH PROPOFOL;  Surgeon: Kieth Brightly, MD;  Location: [facility];  Service: Endoscopy;  Laterality: N/A;   HERNIA REPAIR Bilateral 2014   umbilical and bil inguinal/ Dr Egbert Garibaldi   KIDNEY STONE SURGERY     KNEE ARTHROSCOPY Left    open lithotomy     SHOULDER ARTHROSCOPY WITH OPEN ROTATOR CUFF REPAIR AND DISTAL CLAVICLE ACROMINECTOMY Right 08/06/2022   Procedure: SHOULDER ARTHROSCOPY WITH OPEN ROTATOR CUFF REPAIR AND DISTAL CLAVICLE ACROMINECTOMY;  Surgeon: Juanell Fairly, MD;  Location: [facility];  Service: Orthopedics;  Laterality: Right;   Patient Active Problem List   Diagnosis Date Noted   History of rectal bleeding 06/01/2018   Left groin pain 03/18/2018   Sacroiliac joint pain 01/13/2017   Abdominal pain, left lower quadrant 08/23/2015   BPH with obstruction/lower urinary tract symptoms 08/23/2015   Biceps tendinitis 06/25/2015    PCP: Dortha Kern, MD  REFERRING PROVIDER: [name], MD  REFERRING DIAGNOSIS:  M54.12 (ICD-10-CM) - Cervical radiculopathy  M25.511,G89.29 (ICD-10-CM) - Chronic right shoulder pain    THERAPY DIAG: Cervicalgia  Right  shoulder pain, unspecified chronicity  Muscle weakness (generalized)  RATIONALE FOR EVALUATION AND TREATMENT: Rehabilitation  ONSET DATE: 02/06/23, R upper quarter symptoms got worse  FOLLOW UP APPT WITH PROVIDER: Not currently scheduled, f/u planned after NVC   SUBJECTIVE:                                                                                                                                                                                         Chief Complaint: Pt is a 67 year old male with Hx of neck pain and R upper limb referred pain; hx of RCR/decompression in 08/06/22. Neck pain and R upper limb  paresthesias interfered with participation in advanced-phase RCR rehab in 2023  Pertinent History Pt is a 67 year old male s/p R shoulder rotator cuff repair and distal clavicle acromionectomy with Dr. Martha Clan on 08/06/22. Patient has hx of cervical spine pain that limited RTC repair rehab. He has new referral for cervicalgia. He reports some localized pain in top of shoulder with AROM of R arm. He has continuous soreness affecting R shoulder. Dr. Myer Haff is considering ACDF, but his A1C is not well controlled at this time. Patient reports difficulty with turning his head to L and with flexion/extension. Pain is across upper traps and down his R upper limb with numbness/tingling affecting his R thumb. MR findings of multilevel degenerative disc disease with severe involvement at C5-C6 through C7-T1 levels. foraminal stenosis is greatest at C3-C4 and C7-T1 level with moderate involvement. Eccentric rightward disc protrusion contours the anterior surface right hemicord at C5-C6. MRI of R shoulder demonstrated no RTC re-tear. No cord compression, cord edema, or syrinx. Patient reports pain is constant. Patient reports he can intermittently get spasms all the way down R side of his back to lumbar region as well. Pt has NCV scheduled for 04/12/23. Intermittent disturbed sleep; he reports using different pillows/positions and this helps.   Pain:  Pain Intensity: Present: 5/10, Best: 2/10, Worst: 8-9/10 Pain location: Pain across upper traps, down R upper limb and to R thumb; "twisting" feeling with spasm  Pain Quality:  dull, ache   Radiating: Yes , radiating down RUE;  Numbness/Tingling: Yes, tingling into R thumb/radial styloid region; feels numb to touch along R radial styloid region  Focal Weakness: Yes, weakness with gripping, intermittently dropping items Aggravating factors: bending over, reaching, lying on R side, sitting unsupported Relieving factors: stretching can help, sitting with roller up  towel behind neck 24-hour pain behavior: worse in PM  History of prior neck injury, pain, surgery, or therapy: Yes, previous PT for RCR and for neck pain prior to f/u with physician Falls: Has patient fallen in last  6 months? 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  Recent memory is intact.  Remote memory is intact.  Attention span and concentration are intact.  Expressive speech is intact.  Patient's fund of knowledge is within normal limits for educational level.    Gross Musculoskeletal Assessment Tremor: None Bulk: Normal Tone: Normal   Posture Forward head, mild inc thoracic kyphosis, mild Dowager's hump, rounded shoulders   AROM AROM (Normal range in degrees) AROM 04/08/2023  Cervical  Flexion (50) 30  Extension (80) 30  Right lateral flexion (45) 20  Left lateral flexion (45) 18  Right rotation (85) 55  Left rotation (85) 40  (* = pain; Blank rows = not tested)   Shoulder AROM Shoulder flexion: R 135*, L 163 Shoulder abduction: R 138*, L WNL ER: R 87*, L 92 IR: R 48*, L 70    MMT MMT (out of 5) Right 04/11/2023 Left 04/11/2023       Shoulder   Flexion 3+ 4+  Extension    Abduction 4- 4+  Internal rotation    Horizontal abduction    Horizontal adduction    Lower Trapezius    Rhomboids        Elbow  Flexion 4+ 5  Extension 5 5  Pronation    Supination        Wrist  Flexion 4+ 5  Extension 4 5  Radial deviation    Ulnar deviation        (* = pain; Blank rows = not tested)  Sensation Grossly intact to light touch bilateral UE as determined by testing dermatomes C2-T2. Proprioception and hot/cold testing deferred on this date.  Reflexes R/L Elbow: 2+/2+  Brachioradialis: 2+/2+  Tricep: 1+/1+ Hoffman's: negative Clonus: negative bilat    Palpation Location LEFT  RIGHT           Suboccipitals 1   Cervical paraspinals 2 1  Upper Trapezius 2 2  Levator Scapulae 1 1  Rhomboid Major/Minor 1   (Blank rows = not tested) Graded on 0-4 scale (0 = no pain, 1 = pain, 2 = pain with wincing/grimacing/flinching, 3 = pain with withdrawal, 4 = unwilling to allow palpation), (Blank rows = not tested)  Repeated Movements Repeated retraction: Increase in tension along mid-cervical spine, no worse or no exacerbation afterward   Passive Accessory Intervertebral Motion Deferred to next visit     SPECIAL TESTS Spurlings A (ipsilateral lateral flexion/axial compression): R: Positive L: Negative Distraction Test: Positive for relief  Hoffman Sign (cervical cord compression): R: Negative L: Negative ULTT Median: R: Negative L: Not examined     TODAY'S TREATMENT    Therapeutic Exercise - for HEP establishment, discussion on appropriate exercise/activity modification, PT education   Reviewed baseline  home exercises and provided handout for [facility] program (see Access Code); tactile cueing and therapist demonstration utilized as needed for carryover of proper technique to HEP.    Patient education on current condition, anatomy involved, prognosis, plan of care. Discussion on activity modification to  prevent flare-up of condition, including postural correction, continued use of cervical roll while seated.     PATIENT EDUCATION:  Education details: see above for patient education details Person educated: Patient Education method: Explanation Education comprehension: verbalized understanding   HOME EXERCISE PROGRAM: Repeated cervical retraction, 6x/day, 1 set of 10, 1 sec hold   ASSESSMENT:  CLINICAL IMPRESSION: Patient is a 66 y.o. male who was seen today for physical therapy evaluation and treatment for neck pain with complicated orthopedic history with pt also being s/p R RCR with distal acromionectomy (DOS 08/06/22); neck pain and R upper limb paresthesias interfered with rehab of RCR. Clinical presentation is consistent with cervical radiculopathy. Pt did demonstrate good passive motion at most recent follow-up for R shoulder. He has ongoing limitation with active R upper limb shoulder elevation and reaching/lifting. Pt has undergone injections for C-spine and his physician is considering ACDF; pt has had difficulties with control of Type 2 DM, and this needs to be under control prior to considering surgery or another steroid injection. Objective impairments include decreased activity tolerance, decreased ROM, decreased strength, hypomobility, increased muscle spasms, impaired flexibility, impaired sensation, impaired UE functional use, postural dysfunction, and pain. These impairments are limiting patient from meal prep, cleaning, laundry, driving, shopping, occupation, and yard work. Personal factors including Age, Past/current experiences, Time since onset of injury/illness/exacerbation, and 3+ comorbidities: (Type 2 DM, HTN, and Hx of RCR in October 2023 contributing to R upper quarter condition)  are also affecting patient's functional outcome. Patient will benefit from skilled PT to address above impairments and improve overall function.  REHAB POTENTIAL: Good  CLINICAL DECISION  MAKING: Evolving/moderate complexity  EVALUATION COMPLEXITY: Moderate   GOALS: Goals reviewed with patient? Yes  SHORT TERM GOALS: Target date: 05/02/2023  Pt will be independent with HEP to improve strength and decrease neck pain to improve pain-free function at home and work. Baseline: 04/08/23: Baseline exercises reviewed.   Goal status: INITIAL   LONG TERM GOALS: Target date: 05/23/2023  Pt will increase FOTO to at least 60 to demonstrate significant improvement in function at home and work related to neck pain  Baseline: 04/08/23: 42 Goal status: INITIAL  2.  Pt will decrease worst neck pain by at least 2 points on the NPRS in order to demonstrate clinically significant reduction in neck pain. Baseline: 04/08/23: 8-9/10 at worst Goal status: INITIAL  3.  Pt will demonstrates R shoulder active range of motion within 10 degrees of opposite upper limb for all planes without reproduction of pain as needed for reaching, lifting, overhead work, and self-care ADLs     Baseline: 04/08/23: limited R shoulder active flexion, ABD, IR. Goal status: INITIAL  4.  Patient will demonstrate cervical spine AROM WFL without reproduction of pain as needed for scanning environment, overhead activity, driving Baseline: 1/61/09: C-spine AROM limited in all planes of motion Goal status: INITIAL   PLAN: PT FREQUENCY: 1-2x/week  PT DURATION: 6-8 weeks  PLANNED INTERVENTIONS: Therapeutic exercises, Therapeutic activity, Neuromuscular re-education, Balance training, Gait training, Patient/Family education, Joint manipulation, Joint mobilization, Vestibular training, Canalith repositioning, Dry Needling, Electrical stimulation, Spinal manipulation, Spinal mobilization, Cryotherapy, Moist heat, Taping, Traction, Ultrasound, Ionotophoresis 4mg /ml Dexamethasone, and Manual therapy  PLAN FOR NEXT SESSION: Utilize  cervical traction, STM, and manual therapy to improve mobility, progress HEP for shoulder ROM and  discuss modifications to home program prn. Assess response to repeated retraction. Progress C-spine ROM as tolerated.    Consuela Mimes, PT, DPT #[id number]  Gertie Exon 04/11/2023, 1:41 PM

## 2023-04-11 ENCOUNTER — Encounter: Payer: Self-pay | Admitting: Physical Therapy

## 2023-04-13 ENCOUNTER — Ambulatory Visit: Payer: Medicare Other | Admitting: Physical Therapy

## 2023-04-15 ENCOUNTER — Ambulatory Visit: Payer: Medicare Other | Admitting: Physical Therapy

## 2023-04-15 ENCOUNTER — Encounter: Payer: Self-pay | Admitting: Physical Therapy

## 2023-04-15 DIAGNOSIS — M542 Cervicalgia: Secondary | ICD-10-CM

## 2023-04-15 DIAGNOSIS — M6281 Muscle weakness (generalized): Secondary | ICD-10-CM

## 2023-04-15 DIAGNOSIS — M25511 Pain in right shoulder: Secondary | ICD-10-CM

## 2023-04-15 NOTE — Therapy (Signed)
OUTPATIENT PHYSICAL THERAPY TREATMENT NOTE   Patient Name: Shawn Atkins MRN: [id number] DOB:Feb 22, 1956, 67 y.o., male Today's Date: 04/15/2023  PCP:  Dortha Kern, MD  REFERRING PROVIDER: [name], MD   END OF SESSION:   PT End of Session - 04/15/23 0943     Visit Number 2    Number of Visits 13    Date for PT Re-Evaluation 05/20/23    Authorization Type [employer] Medicare, VL based on medical necessity    Progress Note Due on Visit 10    PT Start Time 0945    PT Stop Time 1030    PT Time Calculation (min) 45 min    Activity Tolerance Patient tolerated treatment well    Behavior During Therapy WFL for tasks assessed/performed             Past Medical History:  Diagnosis Date   BPH (benign prostatic hypertrophy)    Bronchitis    Chronic prostatitis    Complication of anesthesia    Felt like "couldn't breathe" after triple Hernia surgery   Diabetes mellitus without complication (HCC)    Flank pain    GERD (gastroesophageal reflux disease)    h/o   Gross hematuria    History of kidney stones    HTN (hypertension)    pt states he takes lisinopril for kidney protection due to dm not htn   Inguinal hernia    Nocturia    OSA on CPAP    with 02 2 L   Overweight    PONV (postoperative nausea and vomiting)    only during kidney stone surgery   Spermatocele    Stricture of urethra    Vertigo    1 episode, 6-7 yrs ago   Past Surgical History:  Procedure Laterality Date   BICEPT TENODESIS Right 08/06/2022   Procedure: BICEPS TENODESIS;  Surgeon: Juanell Fairly, MD;  Location: [facility];  Service: Orthopedics;  Laterality: Right;   CATARACT EXTRACTION W/PHACO Left 07/22/2020   Procedure: CATARACT EXTRACTION PHACO AND INTRAOCULAR LENS PLACEMENT (IOC) LEFT DIABETIC 4.14  00:33.0;  Surgeon: Nevada Crane, MD;  Location: [facility];  Service: Ophthalmology;  Laterality: Left;  Diabetic - oral meds   CATARACT EXTRACTION W/PHACO Right 08/12/2020    Procedure: CATARACT EXTRACTION PHACO AND INTRAOCULAR LENS PLACEMENT (IOC) RIGHT DIABETIC;  Surgeon: Nevada Crane, MD;  Location: [facility];  Service: Ophthalmology;  Laterality: Right;  1.99 0:26.2   COLONOSCOPY WITH PROPOFOL N/A 11/20/2015   Procedure: COLONOSCOPY WITH PROPOFOL;  Surgeon: Kieth Brightly, MD;  Location: [facility];  Service: Endoscopy;  Laterality: N/A;   HERNIA REPAIR Bilateral 2014   umbilical and bil inguinal/ Dr Egbert Garibaldi   KIDNEY STONE SURGERY     KNEE ARTHROSCOPY Left    open lithotomy     SHOULDER ARTHROSCOPY WITH OPEN ROTATOR CUFF REPAIR AND DISTAL CLAVICLE ACROMINECTOMY Right 08/06/2022   Procedure: SHOULDER ARTHROSCOPY WITH OPEN ROTATOR CUFF REPAIR AND DISTAL CLAVICLE ACROMINECTOMY;  Surgeon: Juanell Fairly, MD;  Location: [facility];  Service: Orthopedics;  Laterality: Right;   Patient Active Problem List   Diagnosis Date Noted   History of rectal bleeding 06/01/2018   Left groin pain 03/18/2018   Sacroiliac joint pain 01/13/2017   Abdominal pain, left lower quadrant 08/23/2015   BPH with obstruction/lower urinary tract symptoms 08/23/2015   Biceps tendinitis 06/25/2015    REFERRING DIAG:  M54.12 (ICD-10-CM) - Cervical radiculopathy  M25.511,G89.29 (ICD-10-CM) - Chronic right shoulder pain  THERAPY DIAG:  Cervicalgia  Right shoulder pain, unspecified chronicity  Muscle weakness (generalized)  Rationale for Evaluation and Treatment Rehabilitation  PERTINENT HISTORY: Pt is a 67 year old male s/p R shoulder rotator cuff repair and distal clavicle acromionectomy with Dr. Martha Clan on 08/06/22. Patient has hx of cervical spine pain that limited RTC repair rehab. He has new referral for cervicalgia. He reports some localized pain in top of shoulder with AROM of R arm. He has continuous soreness affecting R shoulder. Dr. Myer Haff is considering ACDF, but his A1C is not well controlled at this time. Patient reports difficulty with  turning his head to L and with flexion/extension. Pain is across upper traps and down his R upper limb with numbness/tingling affecting his R thumb. MR findings of multilevel degenerative disc disease with severe involvement at C5-C6 through C7-T1 levels. foraminal stenosis is greatest at C3-C4 and C7-T1 level with moderate involvement. Eccentric rightward disc protrusion contours the anterior surface right hemicord at C5-C6. MRI of R shoulder demonstrated no RTC re-tear. No cord compression, cord edema, or syrinx. Patient reports pain is constant. Patient reports he can intermittently get spasms all the way down R side of his back to lumbar region as well. Pt has NCV scheduled for 04/12/23. Intermittent disturbed sleep; he reports using different pillows/positions and this helps.    Pain:  Pain Intensity: Present: 5/10, Best: 2/10, Worst: 8-9/10 Pain location: Pain across upper traps, down R upper limb and to R thumb; "twisting" feeling with spasm  Pain Quality:  dull, ache   Radiating: Yes , radiating down RUE;  Numbness/Tingling: Yes, tingling into R thumb/radial styloid region; feels numb to touch along R radial styloid region  Focal Weakness: Yes, weakness with gripping, intermittently dropping items Aggravating factors: bending over, reaching, lying on R side, sitting unsupported Relieving factors: stretching can help, sitting with roller up towel behind neck 24-hour pain behavior: worse in PM  History of prior neck injury, pain, surgery, or therapy: Yes, previous PT for RCR and for neck pain prior to f/u with physician Falls: Has patient fallen in last 6 months? 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 Patient had NCV study completed Monday of this week and is awaiting results from Dr. Sherryll Burger. Pt describes pain as stiff and sore. Patient reports being awakened by pain; pain across upper traps and both sides of neck/paracervical mm.    PAIN:  Are you having pain? Yes: NPRS scale: 2-3/10     OBJECTIVE: (objective measures completed at initial evaluation unless otherwise dated)   Patient Surveys  FOTO 42, predicted improvement to 19   Cognition Patient is oriented to person, place, and time.  Recent memory is intact.  Remote memory is intact.  Attention span and concentration are intact.  Expressive speech is intact.  Patient's fund of knowledge is within normal limits for educational level.                          Gross Musculoskeletal Assessment Tremor: None Bulk: Normal Tone: Normal     Posture Forward head, mild inc thoracic kyphosis, mild Dowager's hump, rounded shoulders     AROM AROM (Normal range in degrees) AROM 04/08/2023  Cervical  Flexion (50) 30  Extension (80) 30  Right lateral flexion (45) 20  Left lateral flexion (45) 18  Right rotation (85) 55  Left rotation (85) 40  (* = pain; Blank rows = not tested)     Shoulder  AROM Shoulder flexion: R 135*, L 163 Shoulder abduction: R 138*, L WNL ER: R 87*, L 92 IR: R 48*, L 70       MMT MMT (out of 5) Right 04/11/2023 Left 04/11/2023         Shoulder   Flexion 3+ 4+  Extension      Abduction 4- 4+  Internal rotation      Horizontal abduction      Horizontal adduction      Lower Trapezius      Rhomboids             Elbow  Flexion 4+ 5  Extension 5 5  Pronation      Supination             Wrist  Flexion 4+ 5  Extension 4 5  Radial deviation      Ulnar deviation             (* = pain; Blank rows = not tested)   Sensation Grossly intact to light touch bilateral UE as determined by testing dermatomes C2-T2. Proprioception and hot/cold testing deferred on this date.   Reflexes R/L Elbow: 2+/2+  Brachioradialis: 2+/2+  Tricep: 1+/1+ Hoffman's: negative Clonus: negative bilat      Palpation Location LEFT  RIGHT           Suboccipitals 1    Cervical paraspinals 2 1  Upper Trapezius 2 2  Levator Scapulae 1 1  Rhomboid Major/Minor 1    (Blank rows = not tested) Graded on 0-4 scale (0 = no pain, 1 = pain, 2 = pain with wincing/grimacing/flinching, 3 = pain with withdrawal, 4 = unwilling to allow palpation), (Blank rows = not tested)   Repeated Movements Repeated retraction: Increase in tension along mid-cervical spine, no worse or no exacerbation afterward     Passive Accessory Intervertebral Motion Deferred to next visit      SPECIAL TESTS Spurlings A (ipsilateral lateral flexion/axial compression): R: Positive L: Negative Distraction Test: Positive for relief  Hoffman Sign (cervical cord compression): R:  Negative L: Negative ULTT Median: R: Negative L: Not examined         TODAY'S TREATMENT      Manual Therapy - for symptom modulation, soft tissue sensitivity and mobility, joint mobility, ROM    Manual cervical traction in supine; 10 sec on, 10 sec off; x 15 minutes for nerve root decompression and symptom  modulation DTM/TPR bilateral upper traps; x 6 minutes STM bilateral splenius cervicis/capitis C3-5 x 3 minutes     Therapeutic Exercise - for improved soft tissue flexibility and extensibility as needed for ROM, repeated movements    AROM Cervical flexion:  50% Cervical extension: 50% Lateral flexion: Right 25%*, Left 25%* Cervical rotation: Right 75%, Left 50%* *Indicates pain   Cervical retraction, in supine; 2x10, 1 sec Scapular retraction; 2x10, 5 sec   PATIENT EDUCATION: HEP creation and provision of Med[employer] handout. We discussed modifications and decreasing intensity on R shoulder exercises as needed depending on any reproduction of neck pain.      PATIENT EDUCATION:  Education details: see above for patient education details Person educated: Patient Education method: Explanation Education comprehension: verbalized understanding     HOME EXERCISE PROGRAM: Access Code: [email or web address] URL: [email or web address] Date: 04/15/2023 Prepared by: Consuela Mimes  Exercises - Supine Chin Tuck  - 5-6 x daily - 7 x weekly - 1 sets - 10 reps - 1sec hold - Seated Scapular Retraction  - 2 x daily - 7 x weekly - 2 sets - 10 reps - 5sec hold Pt continuing R shoulder AAROM and RTC strengthening from previous episode of care      ASSESSMENT:   CLINICAL IMPRESSION: Patient arrives with excellent motivation to participate in physical therapy. He has ongoing debilitating neck pain and limited R upper limb functional use. Participation in post-operative RCR rehab was limited due to neck pain and comorbid referred pain down R upper limb. Pt did benefit from cervical spine injections at the time. Pt is continuing follow-up with neurology and neurosurgery regarding ongoing pain related to cervical radiculopathy. Symptoms are mitigated with use of traction, and pt will likely benefit from addition of mechanical traction. Patient has remaining deficits in cervical spine and R  shoulder AROM, C-spine mobility, postural changes, R deltoid/RTC/periscapular strength, R upper limb paresthesias down to C6 dermatome, and L>R upper quarter pain. Patient will benefit from continued skilled therapeutic intervention to address the above deficits as needed for improved function and QoL.      REHAB POTENTIAL: Good   CLINICAL DECISION MAKING: Evolving/moderate complexity   EVALUATION COMPLEXITY: Moderate     GOALS: Goals reviewed with patient? Yes   SHORT TERM GOALS: Target date: 05/02/2023   Pt will be independent with HEP to improve strength and decrease neck pain to improve pain-free function at home and work. Baseline: 04/08/23: Baseline exercises reviewed.   Goal status: INITIAL     LONG TERM GOALS: Target date: 05/23/2023   Pt will increase FOTO to at least 60 to demonstrate significant improvement in function at home and work related to neck pain  Baseline: 04/08/23: 42 Goal status: INITIAL   2.  Pt will decrease worst neck pain by at least 2 points on the NPRS in order to demonstrate clinically significant reduction in neck pain. Baseline: 04/08/23: 8-9/10 at worst Goal status: INITIAL   3.  Pt will demonstrates R shoulder active range of motion within 10 degrees of opposite upper limb for all planes without reproduction of pain as needed for reaching, lifting, overhead work, and  self-care ADLs     Baseline: 04/08/23: limited R shoulder active flexion, ABD, IR. Goal status: INITIAL   4.  Patient will demonstrate cervical spine AROM WFL without reproduction of pain as needed for scanning environment, overhead activity, driving Baseline: 1/61/09: C-spine AROM limited in all planes of motion Goal status: INITIAL     PLAN: PT FREQUENCY: 1-2x/week   PT DURATION: 6-8 weeks   PLANNED INTERVENTIONS: Therapeutic exercises, Therapeutic activity, Neuromuscular re-education, Balance training, Gait training, Patient/Family education, Joint manipulation, Joint  mobilization, Vestibular training, Canalith repositioning, Dry Needling, Electrical stimulation, Spinal manipulation, Spinal mobilization, Cryotherapy, Moist heat, Taping, Traction, Ultrasound, Ionotophoresis 4mg /ml Dexamethasone, and Manual therapy   PLAN FOR NEXT SESSION: Utilize cervical traction, STM, and manual therapy to improve mobility, progress HEP for shoulder ROM and discuss modifications to home program prn. Assess response to repeated retraction. Progress C-spine ROM as tolerated.    Consuela Mimes, PT, DPT #[id number]  Gertie Exon, PT 04/15/2023, 10:31 AM

## 2023-04-20 ENCOUNTER — Telehealth: Payer: Self-pay

## 2023-04-20 ENCOUNTER — Ambulatory Visit: Payer: Medicare Other | Admitting: Physical Therapy

## 2023-04-20 DIAGNOSIS — M542 Cervicalgia: Secondary | ICD-10-CM | POA: Diagnosis not present

## 2023-04-20 DIAGNOSIS — M6281 Muscle weakness (generalized): Secondary | ICD-10-CM

## 2023-04-20 DIAGNOSIS — R200 Anesthesia of skin: Secondary | ICD-10-CM

## 2023-04-20 DIAGNOSIS — M5412 Radiculopathy, cervical region: Secondary | ICD-10-CM

## 2023-04-20 DIAGNOSIS — M25511 Pain in right shoulder: Secondary | ICD-10-CM

## 2023-04-20 NOTE — Telephone Encounter (Signed)
MRI ordered. Notified pt via mychart.

## 2023-04-20 NOTE — Therapy (Signed)
OUTPATIENT PHYSICAL THERAPY TREATMENT NOTE   Patient Name: Shawn Atkins MRN: [id number] DOB:1956/10/15, 67 y.o., male Today's Date: 04/20/2023  PCP:  Dortha Kern, MD  REFERRING PROVIDER: [name], MD   END OF SESSION:   PT End of Session - 04/20/23 0947     Visit Number 3    Number of Visits 13    Date for PT Re-Evaluation 05/20/23    Authorization Type [employer] Medicare, VL based on medical necessity    Progress Note Due on Visit 10    PT Start Time 0948    PT Stop Time 1029    PT Time Calculation (min) 41 min    Activity Tolerance Patient tolerated treatment well    Behavior During Therapy WFL for tasks assessed/performed             Past Medical History:  Diagnosis Date   BPH (benign prostatic hypertrophy)    Bronchitis    Chronic prostatitis    Complication of anesthesia    Felt like "couldn't breathe" after triple Hernia surgery   Diabetes mellitus without complication (HCC)    Flank pain    GERD (gastroesophageal reflux disease)    h/o   Gross hematuria    History of kidney stones    HTN (hypertension)    pt states he takes lisinopril for kidney protection due to dm not htn   Inguinal hernia    Nocturia    OSA on CPAP    with 02 2 L   Overweight    PONV (postoperative nausea and vomiting)    only during kidney stone surgery   Spermatocele    Stricture of urethra    Vertigo    1 episode, 6-7 yrs ago   Past Surgical History:  Procedure Laterality Date   BICEPT TENODESIS Right 08/06/2022   Procedure: BICEPS TENODESIS;  Surgeon: Juanell Fairly, MD;  Location: [facility];  Service: Orthopedics;  Laterality: Right;   CATARACT EXTRACTION W/PHACO Left 07/22/2020   Procedure: CATARACT EXTRACTION PHACO AND INTRAOCULAR LENS PLACEMENT (IOC) LEFT DIABETIC 4.14  00:33.0;  Surgeon: Nevada Crane, MD;  Location: [facility];  Service: Ophthalmology;  Laterality: Left;  Diabetic - oral meds   CATARACT EXTRACTION W/PHACO Right 08/12/2020    Procedure: CATARACT EXTRACTION PHACO AND INTRAOCULAR LENS PLACEMENT (IOC) RIGHT DIABETIC;  Surgeon: Nevada Crane, MD;  Location: [facility];  Service: Ophthalmology;  Laterality: Right;  1.99 0:26.2   COLONOSCOPY WITH PROPOFOL N/A 11/20/2015   Procedure: COLONOSCOPY WITH PROPOFOL;  Surgeon: Kieth Brightly, MD;  Location: [facility];  Service: Endoscopy;  Laterality: N/A;   HERNIA REPAIR Bilateral 2014   umbilical and bil inguinal/ Dr Egbert Garibaldi   KIDNEY STONE SURGERY     KNEE ARTHROSCOPY Left    open lithotomy     SHOULDER ARTHROSCOPY WITH OPEN ROTATOR CUFF REPAIR AND DISTAL CLAVICLE ACROMINECTOMY Right 08/06/2022   Procedure: SHOULDER ARTHROSCOPY WITH OPEN ROTATOR CUFF REPAIR AND DISTAL CLAVICLE ACROMINECTOMY;  Surgeon: Juanell Fairly, MD;  Location: [facility];  Service: Orthopedics;  Laterality: Right;   Patient Active Problem List   Diagnosis Date Noted   History of rectal bleeding 06/01/2018   Left groin pain 03/18/2018   Sacroiliac joint pain 01/13/2017   Abdominal pain, left lower quadrant 08/23/2015   BPH with obstruction/lower urinary tract symptoms 08/23/2015   Biceps tendinitis 06/25/2015    REFERRING DIAG:  M54.12 (ICD-10-CM) - Cervical radiculopathy  M25.511,G89.29 (ICD-10-CM) - Chronic right shoulder pain  THERAPY DIAG:  Cervicalgia  Right shoulder pain, unspecified chronicity  Muscle weakness (generalized)  Acute pain of right shoulder  Rationale for Evaluation and Treatment Rehabilitation  PERTINENT HISTORY: Pt is a 67 year old male s/p R shoulder rotator cuff repair and distal clavicle acromionectomy with Dr. Martha Clan on 08/06/22. Patient has hx of cervical spine pain that limited RTC repair rehab. He has new referral for cervicalgia. He reports some localized pain in top of shoulder with AROM of R arm. He has continuous soreness affecting R shoulder. Dr. Myer Haff is considering ACDF, but his A1C is not well controlled at this time.  Patient reports difficulty with turning his head to L and with flexion/extension. Pain is across upper traps and down his R upper limb with numbness/tingling affecting his R thumb. MR findings of multilevel degenerative disc disease with severe involvement at C5-C6 through C7-T1 levels. foraminal stenosis is greatest at C3-C4 and C7-T1 level with moderate involvement. Eccentric rightward disc protrusion contours the anterior surface right hemicord at C5-C6. MRI of R shoulder demonstrated no RTC re-tear. No cord compression, cord edema, or syrinx. Patient reports pain is constant. Patient reports he can intermittently get spasms all the way down R side of his back to lumbar region as well. Pt has NCV scheduled for 04/12/23. Intermittent disturbed sleep; he reports using different pillows/positions and this helps.    Pain:  Pain Intensity: Present: 5/10, Best: 2/10, Worst: 8-9/10 Pain location: Pain across upper traps, down R upper limb and to R thumb; "twisting" feeling with spasm  Pain Quality:  dull, ache   Radiating: Yes , radiating down RUE;  Numbness/Tingling: Yes, tingling into R thumb/radial styloid region; feels numb to touch along R radial styloid region  Focal Weakness: Yes, weakness with gripping, intermittently dropping items Aggravating factors: bending over, reaching, lying on R side, sitting unsupported Relieving factors: stretching can help, sitting with roller up towel behind neck 24-hour pain behavior: worse in PM  History of prior neck injury, pain, surgery, or therapy: Yes, previous PT for RCR and for neck pain prior to f/u with physician Falls: Has patient fallen in last 6 months? 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 He reports having notable pain during the evening the night before last. Patient reports pain in periscpapular region and along his neck. He reports using Aspercreme - he states that topical treatment can help. Pt reports pain affecting L side of his neck presently. He states symptoms are worse typically late in the day.    PAIN:  Are you having pain? Yes: NPRS scale: 2/10 L paracervical region     OBJECTIVE: (objective measures completed at initial evaluation unless otherwise dated)   Patient Surveys  FOTO 42, predicted improvement to 18   Cognition Patient is oriented to person, place, and time.  Recent memory is intact.  Remote memory is intact.  Attention span and concentration are intact.  Expressive speech is intact.  Patient's fund of knowledge is within normal limits for educational level.                          Gross Musculoskeletal Assessment Tremor: None Bulk: Normal Tone: Normal     Posture Forward head, mild inc thoracic kyphosis, mild Dowager's hump, rounded shoulders     AROM AROM (Normal range in degrees) AROM 04/08/2023  Cervical  Flexion (50) 30  Extension (80) 30  Right lateral flexion (45) 20  Left lateral flexion (45)  18  Right rotation (85) 55  Left rotation (85) 40  (* = pain; Blank rows = not tested)     Shoulder AROM Shoulder flexion: R 135*, L 163 Shoulder abduction: R 138*, L WNL ER: R 87*, L 92 IR: R 48*, L 70       MMT MMT (out of 5) Right 04/11/2023 Left 04/11/2023         Shoulder   Flexion 3+ 4+  Extension      Abduction 4- 4+  Internal rotation      Horizontal abduction      Horizontal adduction      Lower Trapezius      Rhomboids             Elbow  Flexion 4+ 5  Extension 5 5  Pronation      Supination             Wrist  Flexion 4+ 5  Extension 4 5  Radial deviation      Ulnar deviation             (* = pain; Blank rows = not tested)   Sensation Grossly intact to light touch bilateral UE as determined by testing dermatomes C2-T2. Proprioception and hot/cold testing deferred on this date.   Reflexes R/L Elbow: 2+/2+  Brachioradialis: 2+/2+  Tricep: 1+/1+ Hoffman's: negative Clonus: negative bilat      Palpation Location LEFT  RIGHT           Suboccipitals 1    Cervical paraspinals 2 1  Upper Trapezius 2 2  Levator Scapulae 1 1  Rhomboid Major/Minor 1    (Blank rows = not tested) Graded on 0-4 scale (0 = no pain, 1 = pain, 2 = pain with wincing/grimacing/flinching, 3 = pain with withdrawal, 4 = unwilling to allow palpation), (Blank rows = not tested)   Repeated Movements Repeated retraction: Increase in tension along mid-cervical spine, no worse or no exacerbation afterward     Passive Accessory Intervertebral Motion Hypomobile sideglide with R to L glide at C4-6 levels, Hypomobile generally throughout C-spine with L to R sideglide  SPECIAL TESTS Spurlings A (ipsilateral lateral flexion/axial compression): R: Positive L: Negative Distraction Test: Positive for relief  Hoffman Sign (cervical cord compression): R: Negative L: Negative ULTT Median: R: Negative L: Not examined         TODAY'S TREATMENT      Manual Therapy - for symptom  modulation, soft tissue sensitivity and mobility, joint mobility, ROM    Manual cervical traction in supine; 10 sec on, 10 sec off; x 3 minutes for nerve root decompression and symptom modulation DTM/TPR bilateral upper traps; x 5 minutes STM bilateral splenius cervicis/capitis C4-6 x 10 minutes Cervical spine sideglides, emphasis on R to L; 2 x 30 sec at C4-6 levels    Mechanical Traction - for nerve root decompression and pain relief; x 10 minutes with 18-20 lbs with static hold using Saunder's Unit on treatment table     Therapeutic Exercise - for improved soft tissue flexibility and extensibility as needed for ROM, repeated movements    AROM Lateral flexion: Right 25%*, Left 25%* Cervical rotation: Right WNL, Left 50* (pinch) *Indicates pain   Cervical retraction, in supine; reviewed Supine cervical sidebend stretch; x 30 sec ea dir  -pinch with closing on L side   PATIENT EDUCATION: HEP reviewed. We discussed ongoing repeated movement program at home and avoiding ipsilateral pinch/sharp pain with cervical stretching.    *not today* Scapular retraction; 2x10, 5 sec     PATIENT EDUCATION:  Education details: see above for patient education details Person educated: Patient Education method: Explanation Education comprehension: verbalized understanding     HOME EXERCISE PROGRAM: Access Code: [email or web address] URL: [email or web address] Date: 04/15/2023 Prepared by: Consuela Mimes  Exercises - Supine Chin Tuck  - 5-6 x daily - 7 x weekly - 1 sets - 10 reps - 1sec hold - Seated Scapular Retraction  - 2 x daily - 7 x weekly - 2 sets - 10 reps - 5sec hold Pt continuing R shoulder AAROM and RTC strengthening from previous episode of care      ASSESSMENT:   CLINICAL IMPRESSION: We completed initial trial of mechanical traction with fair response today; pt does have intermittent discomfort with direct pressure of Saunder's unit padding along L mastoid  region. Pt tolerates C-spine mobilizations well and he exhibits improving ROM, though he still has ipsilateral pain with closing on L side. Patient has remaining deficits in cervical spine and R shoulder AROM, C-spine mobility, postural changes, R deltoid/RTC/periscapular strength, R upper limb paresthesias down to C6 dermatome, and L>R upper quarter pain. Patient will benefit from continued skilled therapeutic intervention to address the above deficits as needed for improved function and QoL.      REHAB POTENTIAL: Good   CLINICAL DECISION MAKING: Evolving/moderate complexity   EVALUATION COMPLEXITY: Moderate     GOALS: Goals reviewed with patient? Yes   SHORT TERM GOALS: Target date: 05/02/2023   Pt will be independent with HEP to improve strength and decrease neck pain to improve pain-free function at home and work. Baseline: 04/08/23: Baseline exercises reviewed.   Goal status: INITIAL     LONG TERM GOALS: Target date: 05/23/2023   Pt will increase FOTO to at least 60 to demonstrate significant improvement in function at home and work related to neck pain  Baseline: 04/08/23: 42 Goal status: INITIAL   2.  Pt will decrease worst neck pain by at least 2 points on the NPRS in order to demonstrate clinically significant reduction in neck pain. Baseline: 04/08/23: 8-9/10 at worst Goal status: INITIAL  3.  Pt will demonstrates R shoulder active range of motion within 10 degrees of opposite upper limb for all planes without reproduction of pain as needed for reaching, lifting, overhead work, and self-care ADLs     Baseline: 04/08/23: limited R shoulder active flexion, ABD, IR. Goal status: INITIAL   4.  Patient will demonstrate cervical spine AROM WFL without reproduction of pain as needed for scanning environment, overhead activity, driving Baseline: 1/61/09: C-spine AROM limited in all planes of motion Goal status: INITIAL     PLAN: PT FREQUENCY: 1-2x/week   PT DURATION: 6-8  weeks   PLANNED INTERVENTIONS: Therapeutic exercises, Therapeutic activity, Neuromuscular re-education, Balance training, Gait training, Patient/Family education, Joint manipulation, Joint mobilization, Vestibular training, Canalith repositioning, Dry Needling, Electrical stimulation, Spinal manipulation, Spinal mobilization, Cryotherapy, Moist heat, Taping, Traction, Ultrasound, Ionotophoresis 4mg /ml Dexamethasone, and Manual therapy   PLAN FOR NEXT SESSION: Utilize cervical traction, STM, and manual therapy to improve mobility, progress HEP for shoulder ROM and discuss modifications to home program prn. Assess response to repeated retraction. Progress C-spine ROM as tolerated.    Consuela Mimes, PT, DPT #[id number]  Gertie Exon, PT 04/20/2023, 9:48 AM

## 2023-04-20 NOTE — Telephone Encounter (Signed)
-----   Message from Rockey Situ sent at 04/20/2023  2:17 PM EDT ----- Patient's wife called making sure that we received the EMG results. I told her yes and that Dr.Y is going to order a MRI but we would get back to them in few days. ----- Message ----- From: [name], MD Sent: 04/20/2023  10:26 AM EDT To: Sharlot Gowda, RN; Rockey Situ  What what I do without you  He will need a new cervical spine MRI scan based on these results to see whether he has ongoing compression of his right C6 nerve root   ----- Message ----- From: Sharlot Gowda, RN Sent: 04/20/2023  10:14 AM EDT To: [name], MD; Rockey Situ  You did not order the MRI yet  (This was the patient Shawn Atkins saw then Dr Martha Clan asked you to see - his last A1c on 4/10 was 8.7)  I think you were waiting on the results of his EMG to determine if he needed a new MRI. Your note on 5/21: "I would like to obtain a nerve conduction study.  If this shows active C6 nerve root involvement, I would like to repeat his MRI scan as he may have had a worsening disc herniation around the time of his shoulder arthroscopy." ----- Message ----- From: [name], MD Sent: 04/20/2023  10:08 AM EDT To: Sharlot Gowda, RN; Rockey Situ  Thanks.  I thought I had ordered a new MRI scan for this patient.  I cannot tell whether I did or not.  I wanted that to see whether his disc herniation has gotten worse before making a final decision on whether surgery is an option for him. ----- Message ----- From: Rockey Situ Sent: 04/20/2023  10:04 AM EDT To: [name], MD  EMG results in his chart dated 04/12/2023

## 2023-04-21 ENCOUNTER — Encounter: Payer: Self-pay | Admitting: Physical Therapy

## 2023-04-22 ENCOUNTER — Ambulatory Visit: Payer: Medicare Other | Admitting: Physical Therapy

## 2023-04-22 DIAGNOSIS — M542 Cervicalgia: Secondary | ICD-10-CM | POA: Diagnosis not present

## 2023-04-22 DIAGNOSIS — M6281 Muscle weakness (generalized): Secondary | ICD-10-CM

## 2023-04-22 DIAGNOSIS — M25511 Pain in right shoulder: Secondary | ICD-10-CM

## 2023-04-22 NOTE — Therapy (Signed)
OUTPATIENT PHYSICAL THERAPY TREATMENT NOTE   Patient Name: Shawn Atkins MRN: [id number] DOB:1956/05/20, 67 y.o., male Today's Date: 04/22/2023  PCP:  Dortha Kern, MD  REFERRING PROVIDER: [name], MD   END OF SESSION:   PT End of Session - 04/22/23 0946     Visit Number 4    Number of Visits 13    Date for PT Re-Evaluation 05/20/23    Authorization Type [employer] Medicare, VL based on medical necessity    Progress Note Due on Visit 10    PT Start Time 0947    PT Stop Time 1029    PT Time Calculation (min) 42 min    Activity Tolerance Patient tolerated treatment well    Behavior During Therapy WFL for tasks assessed/performed              Past Medical History:  Diagnosis Date   BPH (benign prostatic hypertrophy)    Bronchitis    Chronic prostatitis    Complication of anesthesia    Felt like "couldn't breathe" after triple Hernia surgery   Diabetes mellitus without complication (HCC)    Flank pain    GERD (gastroesophageal reflux disease)    h/o   Gross hematuria    History of kidney stones    HTN (hypertension)    pt states he takes lisinopril for kidney protection due to dm not htn   Inguinal hernia    Nocturia    OSA on CPAP    with 02 2 L   Overweight    PONV (postoperative nausea and vomiting)    only during kidney stone surgery   Spermatocele    Stricture of urethra    Vertigo    1 episode, 6-7 yrs ago   Past Surgical History:  Procedure Laterality Date   BICEPT TENODESIS Right 08/06/2022   Procedure: BICEPS TENODESIS;  Surgeon: Juanell Fairly, MD;  Location: [facility];  Service: Orthopedics;  Laterality: Right;   CATARACT EXTRACTION W/PHACO Left 07/22/2020   Procedure: CATARACT EXTRACTION PHACO AND INTRAOCULAR LENS PLACEMENT (IOC) LEFT DIABETIC 4.14  00:33.0;  Surgeon: Nevada Crane, MD;  Location: [facility];  Service: Ophthalmology;  Laterality: Left;  Diabetic - oral meds   CATARACT EXTRACTION W/PHACO Right 08/12/2020    Procedure: CATARACT EXTRACTION PHACO AND INTRAOCULAR LENS PLACEMENT (IOC) RIGHT DIABETIC;  Surgeon: Nevada Crane, MD;  Location: [facility];  Service: Ophthalmology;  Laterality: Right;  1.99 0:26.2   COLONOSCOPY WITH PROPOFOL N/A 11/20/2015   Procedure: COLONOSCOPY WITH PROPOFOL;  Surgeon: Kieth Brightly, MD;  Location: [facility];  Service: Endoscopy;  Laterality: N/A;   HERNIA REPAIR Bilateral 2014   umbilical and bil inguinal/ Dr Egbert Garibaldi   KIDNEY STONE SURGERY     KNEE ARTHROSCOPY Left    open lithotomy     SHOULDER ARTHROSCOPY WITH OPEN ROTATOR CUFF REPAIR AND DISTAL CLAVICLE ACROMINECTOMY Right 08/06/2022   Procedure: SHOULDER ARTHROSCOPY WITH OPEN ROTATOR CUFF REPAIR AND DISTAL CLAVICLE ACROMINECTOMY;  Surgeon: Juanell Fairly, MD;  Location: [facility];  Service: Orthopedics;  Laterality: Right;   Patient Active Problem List   Diagnosis Date Noted   History of rectal bleeding 06/01/2018   Left groin pain 03/18/2018   Sacroiliac joint pain 01/13/2017   Abdominal pain, left lower quadrant 08/23/2015   BPH with obstruction/lower urinary tract symptoms 08/23/2015   Biceps tendinitis 06/25/2015    REFERRING DIAG:  M54.12 (ICD-10-CM) - Cervical radiculopathy  M25.511,G89.29 (ICD-10-CM) - Chronic right shoulder pain  THERAPY DIAG:  Cervicalgia  Right shoulder pain, unspecified chronicity  Muscle weakness (generalized)  Rationale for Evaluation and Treatment Rehabilitation  PERTINENT HISTORY: Pt is a 67 year old male s/p R shoulder rotator cuff repair and distal clavicle acromionectomy with Dr. Martha Clan on 08/06/22. Patient has hx of cervical spine pain that limited RTC repair rehab. He has new referral for cervicalgia. He reports some localized pain in top of shoulder with AROM of R arm. He has continuous soreness affecting R shoulder. Dr. Myer Haff is considering ACDF, but his A1C is not well controlled at this time. Patient reports difficulty with  turning his head to L and with flexion/extension. Pain is across upper traps and down his R upper limb with numbness/tingling affecting his R thumb. MR findings of multilevel degenerative disc disease with severe involvement at C5-C6 through C7-T1 levels. foraminal stenosis is greatest at C3-C4 and C7-T1 level with moderate involvement. Eccentric rightward disc protrusion contours the anterior surface right hemicord at C5-C6. MRI of R shoulder demonstrated no RTC re-tear. No cord compression, cord edema, or syrinx. Patient reports pain is constant. Patient reports he can intermittently get spasms all the way down R side of his back to lumbar region as well. Pt has NCV scheduled for 04/12/23. Intermittent disturbed sleep; he reports using different pillows/positions and this helps.    Pain:  Pain Intensity: Present: 5/10, Best: 2/10, Worst: 8-9/10 Pain location: Pain across upper traps, down R upper limb and to R thumb; "twisting" feeling with spasm  Pain Quality:  dull, ache   Radiating: Yes , radiating down RUE;  Numbness/Tingling: Yes, tingling into R thumb/radial styloid region; feels numb to touch along R radial styloid region  Focal Weakness: Yes, weakness with gripping, intermittently dropping items Aggravating factors: bending over, reaching, lying on R side, sitting unsupported Relieving factors: stretching can help, sitting with roller up towel behind neck 24-hour pain behavior: worse in PM  History of prior neck injury, pain, surgery, or therapy: Yes, previous PT for RCR and for neck pain prior to f/u with physician Falls: Has patient fallen in last 6 months? 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 Patient got results from NCV study and pt will be moving forward with C-spine MRI. Patient reports stiffness in neck; pain along upper traps. Patient reports more stiffness upon waking that improves with movement.    PAIN:  Are you having pain? Yes: NPRS scale: 1/10 across neck     OBJECTIVE: (objective measures completed at initial evaluation unless otherwise dated)   Patient Surveys  FOTO 42, predicted improvement to 89   Cognition Patient is oriented to person, place, and time.  Recent memory is intact.  Remote memory is intact.  Attention span and concentration are intact.  Expressive speech is intact.  Patient's fund of knowledge is within normal limits for educational level.                          Gross Musculoskeletal Assessment Tremor: None Bulk: Normal Tone: Normal     Posture Forward head, mild inc thoracic kyphosis, mild Dowager's hump, rounded shoulders     AROM AROM (Normal range in degrees) AROM 04/08/2023  Cervical  Flexion (50) 30  Extension (80) 30  Right lateral flexion (45) 20  Left lateral flexion (45) 18  Right rotation (85) 55  Left rotation (85) 40  (* = pain; Blank rows = not tested)     Shoulder AROM Shoulder flexion: R 135*, L  163 Shoulder abduction: R 138*, L WNL ER: R 87*, L 92 IR: R 48*, L 70       MMT MMT (out of 5) Right 04/11/2023 Left 04/11/2023         Shoulder   Flexion 3+ 4+  Extension      Abduction 4- 4+  Internal rotation      Horizontal abduction      Horizontal adduction      Lower Trapezius      Rhomboids             Elbow  Flexion 4+ 5  Extension 5 5  Pronation      Supination             Wrist  Flexion 4+ 5  Extension 4 5  Radial deviation      Ulnar deviation             (* = pain; Blank rows = not tested)   Sensation Grossly intact to light touch bilateral UE as determined by testing dermatomes C2-T2. Proprioception and hot/cold testing deferred on this date.   Reflexes R/L Elbow: 2+/2+  Brachioradialis: 2+/2+  Tricep: 1+/1+ Hoffman's: negative Clonus: negative bilat      Palpation Location LEFT  RIGHT           Suboccipitals 1    Cervical paraspinals 2 1  Upper Trapezius 2 2  Levator Scapulae 1 1  Rhomboid Major/Minor 1    (Blank rows = not tested) Graded on 0-4 scale (0 = no pain, 1 = pain, 2 = pain with wincing/grimacing/flinching, 3 = pain with withdrawal, 4 = unwilling to allow palpation), (Blank rows = not tested)   Repeated Movements Repeated retraction: Increase in tension along mid-cervical spine, no worse or no exacerbation afterward     Passive Accessory Intervertebral Motion Hypomobile sideglide with R to L glide at C4-6 levels, Hypomobile generally throughout C-spine with L to R sideglide      SPECIAL TESTS Spurlings A (ipsilateral lateral flexion/axial compression): R: Positive L: Negative Distraction Test: Positive  for relief  Hoffman Sign (cervical cord compression): R: Negative L: Negative ULTT Median: R: Negative L: Not examined         TODAY'S TREATMENT     Mechanical Traction - for nerve root decompression and pain relief; x 10 minutes with 17-20 lbs with static hold using Saunder's Unit on treatment table    Manual  Therapy - for symptom modulation, soft tissue sensitivity and mobility, joint mobility, ROM    DTM/TPR bilateral upper traps; x 5 minutes STM bilateral splenius cervicis/capitis C4-6 x 10 minutes Cervical spine sideglides, emphasis on R to L; 2 x 30 sec at C4-6 levels  MET for improved L C-spine rotation; x10 with 5-sec antagonist contraction   *not today* Manual cervical traction in supine; 10 sec on, 10 sec off; x 3 minutes for nerve root decompression and symptom modulation   Trigger Point Dry Needling (TDN), unbilled Education performed with patient regarding potential benefit of TDN. Reviewed precautions and risks with patient. Extensive time spent with pt to ensure full understanding of TDN risks. Pt provided verbal consent to treatment. TDN performed to L upper trapezius and L splenius cervicis along C5-6 levels with 0.25 x 40 single needle placements with local twitch response (LTR). Pistoning technique utilized. Acute discomfort with C6 cervical paraspinal needle insertion with no remaining complaints following needle removal. Modestly improved cervical spine rotation to R/L.     Therapeutic Exercise - for improved soft tissue flexibility and extensibility as needed for ROM, repeated movements    AROM Lateral flexion: Right 25%*, Left 25%* Cervical rotation: Right WNL, Left 50* (pinch) *Indicates pain   Supine cervical sidebend stretch; x 30 sec ea dir  -pinch with closing on L side   PATIENT EDUCATION: HEP reviewed. We discussed ongoing repeated movement program at home and avoiding ipsilateral pinch/sharp pain with cervical stretching.    *not today* Cervical retraction, in supine; reviewed Scapular retraction; 2x10, 5 sec     PATIENT EDUCATION:  Education details: see above for patient education details Person educated: Patient Education method: Explanation Education comprehension: verbalized understanding     HOME EXERCISE PROGRAM: Access Code:  [email or web address] URL: [email or web address] Date: 04/15/2023 Prepared by: Consuela Mimes  Exercises - Supine Chin Tuck  - 5-6 x daily - 7 x weekly - 1 sets - 10 reps - 1sec hold - Seated Scapular Retraction  - 2 x daily - 7 x weekly - 2 sets - 10 reps - 5sec hold Pt continuing R shoulder AAROM and RTC strengthening from previous episode of care      ASSESSMENT:   CLINICAL IMPRESSION: Pt tolerates traction better today with positioning of device. Pt denies notable symptoms during traction. He has remaining C-spine stiffness and deficit with closing on L side and with flexion/extension ROM. L cervical spine rotation is modestly improved following MET and use of MT/DN today. R rotation is grossly WFL. Pt does have significant fleeting discomfort with L C6 paraspinal dry needling today, and this treatment is stopped; pt decides to postpone further needling for today. Patient has remaining deficits in cervical spine and R shoulder AROM, C-spine mobility, postural changes, R deltoid/RTC/periscapular strength, R upper limb paresthesias down to C6 dermatome, and L>R upper quarter pain. Patient will benefit from continued skilled therapeutic intervention to address the above deficits as needed for improved function and QoL.      REHAB POTENTIAL: Good   CLINICAL DECISION MAKING: Evolving/moderate complexity   EVALUATION COMPLEXITY: Moderate     GOALS: Goals reviewed with patient?  Yes   SHORT TERM GOALS: Target date: 05/02/2023   Pt will be independent with HEP to improve strength and decrease neck pain to improve pain-free function at home and work. Baseline: 04/08/23: Baseline exercises reviewed.   Goal status: INITIAL     LONG TERM GOALS: Target date: 05/23/2023   Pt will increase FOTO to at least 60 to demonstrate significant improvement in function at home and work related to neck pain  Baseline: 04/08/23: 42 Goal status: INITIAL   2.  Pt will decrease worst neck pain by at  least 2 points on the NPRS in order to demonstrate clinically significant reduction in neck pain. Baseline: 04/08/23: 8-9/10 at worst Goal status: INITIAL   3.  Pt will demonstrates R shoulder active range of motion within 10 degrees of opposite upper limb for all planes without reproduction of pain as needed for reaching, lifting, overhead work, and self-care ADLs     Baseline: 04/08/23: limited R shoulder active flexion, ABD, IR. Goal status: INITIAL   4.  Patient will demonstrate cervical spine AROM WFL without reproduction of pain as needed for scanning environment, overhead activity, driving Baseline: 1/61/09: C-spine AROM limited in all planes of motion Goal status: INITIAL     PLAN: PT FREQUENCY: 1-2x/week   PT DURATION: 6-8 weeks   PLANNED INTERVENTIONS: Therapeutic exercises, Therapeutic activity, Neuromuscular re-education, Balance training, Gait training, Patient/Family education, Joint manipulation, Joint mobilization, Vestibular training, Canalith repositioning, Dry Needling, Electrical stimulation, Spinal manipulation, Spinal mobilization, Cryotherapy, Moist heat, Taping, Traction, Ultrasound, Ionotophoresis 4mg /ml Dexamethasone, and Manual therapy   PLAN FOR NEXT SESSION: Utilize cervical traction, STM, and manual therapy to improve mobility, progress HEP for shoulder ROM and discuss modifications to home program prn. Assess response to repeated retraction. Progress C-spine ROM as tolerated.    Consuela Mimes, PT, DPT #[id number]  Gertie Exon, PT 04/22/2023, 9:47 AM

## 2023-04-26 ENCOUNTER — Encounter: Payer: Self-pay | Admitting: Physical Therapy

## 2023-04-27 ENCOUNTER — Ambulatory Visit: Payer: Medicare Other | Attending: Neurosurgery | Admitting: Physical Therapy

## 2023-04-27 DIAGNOSIS — M25511 Pain in right shoulder: Secondary | ICD-10-CM | POA: Insufficient documentation

## 2023-04-27 DIAGNOSIS — M542 Cervicalgia: Secondary | ICD-10-CM | POA: Diagnosis present

## 2023-04-27 DIAGNOSIS — M6281 Muscle weakness (generalized): Secondary | ICD-10-CM | POA: Insufficient documentation

## 2023-04-27 NOTE — Therapy (Signed)
OUTPATIENT PHYSICAL THERAPY TREATMENT NOTE   Patient Name: Shawn Atkins MRN: [id number] DOB:[birth year], 67 y.o., male Today's Date: 04/27/2023  PCP:  Dortha Kern, MD  REFERRING PROVIDER: [name], MD   END OF SESSION:   PT End of Session - 04/27/23 0955     Visit Number 5    Number of Visits 13    Date for PT Re-Evaluation 05/20/23    Authorization Type [employer] Medicare, VL based on medical necessity    Progress Note Due on Visit 10    PT Start Time 0948    PT Stop Time 1029    PT Time Calculation (min) 41 min    Activity Tolerance Patient tolerated treatment well    Behavior During Therapy WFL for tasks assessed/performed               Past Medical History:  Diagnosis Date   BPH (benign prostatic hypertrophy)    Bronchitis    Chronic prostatitis    Complication of anesthesia    Felt like "couldn't breathe" after triple Hernia surgery   Diabetes mellitus without complication (HCC)    Flank pain    GERD (gastroesophageal reflux disease)    h/o   Gross hematuria    History of kidney stones    HTN (hypertension)    pt states he takes lisinopril for kidney protection due to dm not htn   Inguinal hernia    Nocturia    OSA on CPAP    with 02 2 L   Overweight    PONV (postoperative nausea and vomiting)    only during kidney stone surgery   Spermatocele    Stricture of urethra    Vertigo    1 episode, 6-7 yrs ago   Past Surgical History:  Procedure Laterality Date   BICEPT TENODESIS Right 08/06/2022   Procedure: BICEPS TENODESIS;  Surgeon: Juanell Fairly, MD;  Location: [facility];  Service: Orthopedics;  Laterality: Right;   CATARACT EXTRACTION W/PHACO Left 07/22/2020   Procedure: CATARACT EXTRACTION PHACO AND INTRAOCULAR LENS PLACEMENT (IOC) LEFT DIABETIC 4.14  00:33.0;  Surgeon: Nevada Crane, MD;  Location: [facility];  Service: Ophthalmology;  Laterality: Left;  Diabetic - oral meds   CATARACT EXTRACTION W/PHACO Right 08/12/2020    Procedure: CATARACT EXTRACTION PHACO AND INTRAOCULAR LENS PLACEMENT (IOC) RIGHT DIABETIC;  Surgeon: Nevada Crane, MD;  Location: [facility];  Service: Ophthalmology;  Laterality: Right;  1.99 0:26.2   COLONOSCOPY WITH PROPOFOL N/A 11/20/2015   Procedure: COLONOSCOPY WITH PROPOFOL;  Surgeon: Kieth Brightly, MD;  Location: [facility];  Service: Endoscopy;  Laterality: N/A;   HERNIA REPAIR Bilateral 2014   umbilical and bil inguinal/ Dr Egbert Garibaldi   KIDNEY STONE SURGERY     KNEE ARTHROSCOPY Left    open lithotomy     SHOULDER ARTHROSCOPY WITH OPEN ROTATOR CUFF REPAIR AND DISTAL CLAVICLE ACROMINECTOMY Right 08/06/2022   Procedure: SHOULDER ARTHROSCOPY WITH OPEN ROTATOR CUFF REPAIR AND DISTAL CLAVICLE ACROMINECTOMY;  Surgeon: Juanell Fairly, MD;  Location: [facility];  Service: Orthopedics;  Laterality: Right;   Patient Active Problem List   Diagnosis Date Noted   History of rectal bleeding 06/01/2018   Left groin pain 03/18/2018   Sacroiliac joint pain 01/13/2017   Abdominal pain, left lower quadrant 08/23/2015   BPH with obstruction/lower urinary tract symptoms 08/23/2015   Biceps tendinitis 06/25/2015    REFERRING DIAG:  M54.12 (ICD-10-CM) - Cervical radiculopathy  M25.511,G89.29 (ICD-10-CM) - Chronic right shoulder pain  THERAPY DIAG:  Cervicalgia  Right shoulder pain, unspecified chronicity  Muscle weakness (generalized)  Rationale for Evaluation and Treatment Rehabilitation  PERTINENT HISTORY: Pt is a 68 year old male s/p R shoulder rotator cuff repair and distal clavicle acromionectomy with Dr. Martha Clan on 08/06/22. Patient has hx of cervical spine pain that limited RTC repair rehab. He has new referral for cervicalgia. He reports some localized pain in top of shoulder with AROM of R arm. He has continuous soreness affecting R shoulder. Dr. Myer Haff is considering ACDF, but his A1C is not well controlled at this time. Patient reports difficulty with  turning his head to L and with flexion/extension. Pain is across upper traps and down his R upper limb with numbness/tingling affecting his R thumb. MR findings of multilevel degenerative disc disease with severe involvement at C5-C6 through C7-T1 levels. foraminal stenosis is greatest at C3-C4 and C7-T1 level with moderate involvement. Eccentric rightward disc protrusion contours the anterior surface right hemicord at C5-C6. MRI of R shoulder demonstrated no RTC re-tear. No cord compression, cord edema, or syrinx. Patient reports pain is constant. Patient reports he can intermittently get spasms all the way down R side of his back to lumbar region as well. Pt has NCV scheduled for 04/12/23. Intermittent disturbed sleep; he reports using different pillows/positions and this helps.    Pain:  Pain Intensity: Present: 5/10, Best: 2/10, Worst: 8-9/10 Pain location: Pain across upper traps, down R upper limb and to R thumb; "twisting" feeling with spasm  Pain Quality:  dull, ache   Radiating: Yes , radiating down RUE;  Numbness/Tingling: Yes, tingling into R thumb/radial styloid region; feels numb to touch along R radial styloid region  Focal Weakness: Yes, weakness with gripping, intermittently dropping items Aggravating factors: bending over, reaching, lying on R side, sitting unsupported Relieving factors: stretching can help, sitting with roller up towel behind neck 24-hour pain behavior: worse in PM  History of prior neck injury, pain, surgery, or therapy: Yes, previous PT for RCR and for neck pain prior to f/u with physician Falls: Has patient fallen in last 6 months? 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 Pt reports using his heating pad for some time before bed in the evenings. Patient reports pain into whole R upper limb down to his R palm. Patient reports NT into R radial styloid region. Patient reports doing okay after DN last visit. He elects to hold on DN today due to pain experienced during needle insertion at lower cervical paraspinal.    PAIN:  Are you having pain? Yes: NPRS scale: 2/10 across neck     OBJECTIVE: (objective measures completed at initial evaluation unless otherwise dated)   Patient Surveys  FOTO 42, predicted improvement to 49   Cognition Patient is oriented to person, place, and time.  Recent memory is intact.  Remote memory is intact.  Attention span and concentration are intact.  Expressive speech is intact.  Patient's fund of knowledge is within normal limits for educational level.                          Gross Musculoskeletal Assessment Tremor: None Bulk: Normal Tone: Normal     Posture Forward head, mild inc thoracic kyphosis, mild Dowager's hump, rounded shoulders     AROM AROM (Normal range in degrees) AROM 04/08/2023  Cervical  Flexion (50) 30  Extension (80) 30  Right lateral flexion (45) 20  Left  lateral flexion (45) 18  Right rotation (85) 55  Left rotation (85) 40  (* = pain; Blank rows = not tested)     Shoulder AROM Shoulder flexion: R 135*, L 163 Shoulder abduction: R 138*, L WNL ER: R 87*, L 92 IR: R 48*, L 70       MMT MMT (out of 5) Right 04/11/2023 Left 04/11/2023         Shoulder   Flexion 3+ 4+  Extension      Abduction 4- 4+  Internal rotation      Horizontal abduction      Horizontal adduction      Lower Trapezius      Rhomboids             Elbow  Flexion 4+ 5  Extension 5 5  Pronation      Supination             Wrist  Flexion 4+ 5  Extension 4 5  Radial deviation      Ulnar deviation             (* = pain; Blank rows = not tested)   Sensation Grossly intact to light touch bilateral UE as determined by testing dermatomes C2-T2. Proprioception and hot/cold testing deferred on this date.   Reflexes R/L Elbow: 2+/2+  Brachioradialis: 2+/2+  Tricep: 1+/1+ Hoffman's: negative Clonus: negative bilat      Palpation Location LEFT  RIGHT           Suboccipitals 1    Cervical paraspinals 2 1  Upper Trapezius 2 2  Levator Scapulae 1 1  Rhomboid Major/Minor 1    (Blank rows = not tested) Graded on 0-4 scale (0 = no pain, 1 = pain, 2 = pain with wincing/grimacing/flinching, 3 = pain with withdrawal, 4 = unwilling to allow palpation), (Blank rows = not tested)   Repeated Movements Repeated retraction: Increase in tension along mid-cervical spine, no worse or no exacerbation afterward     Passive Accessory Intervertebral Motion Hypomobile sideglide with R to L  glide at C4-6 levels, Hypomobile generally throughout C-spine with L to R sideglide      SPECIAL TESTS Spurlings A (ipsilateral lateral flexion/axial compression): R: Positive L: Negative Distraction Test: Positive for relief  Hoffman Sign (cervical cord compression): R: Negative L: Negative ULTT Median: R: Negative L: Not examined         TODAY'S TREATMENT      Mechanical Traction - for nerve root decompression and pain relief; x 10 minutes with 18-20 lbs with static hold using Saunder's Unit on treatment table     Manual Therapy - for symptom modulation, soft tissue sensitivity and mobility, joint mobility, ROM    DTM/TPR bilateral upper traps; x 5 minutes STM bilateral splenius cervicis/capitis C4-6 x 10 minutes  Cervical spine sideglides, emphasis on R to L; 2 x 30 sec at C4-6 levels  Passive cervical sidebend stretch within pt tolerance with contralateral scapular depression; x 30 sec on each side   *not today* MET for improved L C-spine rotation; x10 with 5-sec antagonist contraction Manual cervical traction in supine; 10 sec on, 10 sec off; x 3 minutes for nerve root decompression and symptom modulation Trigger Point Dry Needling (TDN), unbilled Education performed with patient regarding potential benefit of TDN. Reviewed precautions and risks with patient. Extensive time spent with pt to ensure full understanding of TDN risks. Pt provided verbal consent to treatment. TDN performed to L upper trapezius and L splenius cervicis along C5-6 levels with 0.25 x 40 single needle placements with local twitch response (LTR). Pistoning technique utilized. Acute discomfort with C6 cervical paraspinal needle insertion with no remaining complaints following needle removal. Modestly improved cervical spine rotation to R/L.      Therapeutic Exercise - for improved soft tissue flexibility and extensibility as needed for ROM, repeated movements    AROM Lateral flexion: Right 50%*, Left 50%* Cervical rotation: Right WNL, Left 50%* (pinch) *Indicates pain   Cervical retraction, in supine; with patient overpressure; 2x10   PATIENT EDUCATION: HEP reviewed. We discussed ongoing repeated movement program at home and avoiding ipsilateral pinch/sharp pain with cervical stretching.    *not today* Supine cervical sidebend stretch; x 30 sec ea  dir  -pinch with closing on L side Scapular retraction; 2x10, 5 sec     PATIENT EDUCATION:  Education details: see above for patient education details Person educated: Patient Education method: Explanation Education comprehension: verbalized understanding     HOME EXERCISE PROGRAM: Access Code: [email or web address] URL: [email or web address] Date: 04/15/2023 Prepared by: Consuela Mimes  Exercises - Supine Chin Tuck  - 5-6 x daily - 7 x weekly - 1 sets - 10 reps - 1sec hold - Seated Scapular Retraction  - 2 x daily - 7 x weekly - 2 sets - 10 reps - 5sec hold Pt continuing R shoulder AAROM and RTC strengthening from previous episode of care      ASSESSMENT:   CLINICAL IMPRESSION: Pt has mild symptoms this AM, though referred pain and paresthesias persist into R upper limb. Symptoms are worst in the evening at this time. Patient has made modest progress with C-spine AROM. We progressed repeated retraction today to include patient overpressure with minimal symptoms reported in lying following completion of repeated movement. Patient has remaining deficits in cervical spine and R shoulder AROM, C-spine mobility, postural changes, R deltoid/RTC/periscapular strength, R upper limb paresthesias down to C6 dermatome, and L>R upper quarter pain. Patient will benefit from continued skilled therapeutic intervention to address the above deficits as needed for improved function  and QoL.      REHAB POTENTIAL: Good   CLINICAL DECISION MAKING: Evolving/moderate complexity   EVALUATION COMPLEXITY: Moderate     GOALS: Goals reviewed with patient? Yes   SHORT TERM GOALS: Target date: 05/02/2023   Pt will be independent with HEP to improve strength and decrease neck pain to improve pain-free function at home and work. Baseline: 04/08/23: Baseline exercises reviewed.   Goal status: INITIAL     LONG TERM GOALS: Target date: 05/23/2023   Pt will increase FOTO to at least 60 to demonstrate  significant improvement in function at home and work related to neck pain  Baseline: 04/08/23: 42 Goal status: INITIAL   2.  Pt will decrease worst neck pain by at least 2 points on the NPRS in order to demonstrate clinically significant reduction in neck pain. Baseline: 04/08/23: 8-9/10 at worst Goal status: INITIAL   3.  Pt will demonstrates R shoulder active range of motion within 10 degrees of opposite upper limb for all planes without reproduction of pain as needed for reaching, lifting, overhead work, and self-care ADLs     Baseline: 04/08/23: limited R shoulder active flexion, ABD, IR. Goal status: INITIAL   4.  Patient will demonstrate cervical spine AROM WFL without reproduction of pain as needed for scanning environment, overhead activity, driving Baseline: 06/24/55: C-spine AROM limited in all planes of motion Goal status: INITIAL     PLAN: PT FREQUENCY: 1-2x/week   PT DURATION: 6-8 weeks   PLANNED INTERVENTIONS: Therapeutic exercises, Therapeutic activity, Neuromuscular re-education, Balance training, Gait training, Patient/Family education, Joint manipulation, Joint mobilization, Vestibular training, Canalith repositioning, Dry Needling, Electrical stimulation, Spinal manipulation, Spinal mobilization, Cryotherapy, Moist heat, Taping, Traction, Ultrasound, Ionotophoresis 4mg /ml Dexamethasone, and Manual therapy   PLAN FOR NEXT SESSION: Utilize cervical traction, STM, and manual therapy to improve mobility, progress HEP for shoulder ROM and discuss modifications to home program prn. Assess response to repeated retraction and force progression. Progress C-spine ROM as tolerated.    Consuela Mimes, PT, DPT #[id number]  Gertie Exon, PT 04/27/2023, 9:55 AM

## 2023-04-30 ENCOUNTER — Ambulatory Visit
Admission: RE | Admit: 2023-04-30 | Discharge: 2023-04-30 | Disposition: A | Discharge status: Home / Self Care | Payer: Medicare Other | Source: Ambulatory Visit | Attending: Neurosurgery | Admitting: Neurosurgery

## 2023-04-30 DIAGNOSIS — R202 Paresthesia of skin: Secondary | ICD-10-CM | POA: Diagnosis present

## 2023-04-30 DIAGNOSIS — M5412 Radiculopathy, cervical region: Secondary | ICD-10-CM | POA: Diagnosis present

## 2023-04-30 DIAGNOSIS — R200 Anesthesia of skin: Secondary | ICD-10-CM | POA: Insufficient documentation

## 2023-05-04 ENCOUNTER — Ambulatory Visit: Payer: Medicare Other | Admitting: Physical Therapy

## 2023-05-04 ENCOUNTER — Telehealth: Payer: Self-pay | Admitting: Physical Therapy

## 2023-05-04 NOTE — Therapy (Deleted)
OUTPATIENT PHYSICAL THERAPY TREATMENT NOTE   Patient Name: Shawn Atkins MRN: [id number] DOB:05-20-1956, 67 y.o., male Today's Date: 05/04/2023  PCP:  Dortha Kern, MD  REFERRING PROVIDER: [name], MD   END OF SESSION:       Past Medical History:  Diagnosis Date   BPH (benign prostatic hypertrophy)    Bronchitis    Chronic prostatitis    Complication of anesthesia    Felt like "couldn't breathe" after triple Hernia surgery   Diabetes mellitus without complication (HCC)    Flank pain    GERD (gastroesophageal reflux disease)    h/o   Gross hematuria    History of kidney stones    HTN (hypertension)    pt states he takes lisinopril for kidney protection due to dm not htn   Inguinal hernia    Nocturia    OSA on CPAP    with 02 2 L   Overweight    PONV (postoperative nausea and vomiting)    only during kidney stone surgery   Spermatocele    Stricture of urethra    Vertigo    1 episode, 6-7 yrs ago   Past Surgical History:  Procedure Laterality Date   BICEPT TENODESIS Right 08/06/2022   Procedure: BICEPS TENODESIS;  Surgeon: Juanell Fairly, MD;  Location: [facility];  Service: Orthopedics;  Laterality: Right;   CATARACT EXTRACTION W/PHACO Left 07/22/2020   Procedure: CATARACT EXTRACTION PHACO AND INTRAOCULAR LENS PLACEMENT (IOC) LEFT DIABETIC 4.14  00:33.0;  Surgeon: Nevada Crane, MD;  Location: [facility];  Service: Ophthalmology;  Laterality: Left;  Diabetic - oral meds   CATARACT EXTRACTION W/PHACO Right 08/12/2020   Procedure: CATARACT EXTRACTION PHACO AND INTRAOCULAR LENS PLACEMENT (IOC) RIGHT DIABETIC;  Surgeon: Nevada Crane, MD;  Location: [facility];  Service: Ophthalmology;  Laterality: Right;  1.99 0:26.2   COLONOSCOPY WITH PROPOFOL N/A 11/20/2015   Procedure: COLONOSCOPY WITH PROPOFOL;  Surgeon: Kieth Brightly, MD;  Location: [facility];  Service: Endoscopy;  Laterality: N/A;   HERNIA REPAIR Bilateral  2014   umbilical and bil inguinal/ Dr Egbert Garibaldi   KIDNEY STONE SURGERY     KNEE ARTHROSCOPY Left    open lithotomy     SHOULDER ARTHROSCOPY WITH OPEN ROTATOR CUFF REPAIR AND DISTAL CLAVICLE ACROMINECTOMY Right 08/06/2022   Procedure: SHOULDER ARTHROSCOPY WITH OPEN ROTATOR CUFF REPAIR AND DISTAL CLAVICLE ACROMINECTOMY;  Surgeon: Juanell Fairly, MD;  Location: [facility];  Service: Orthopedics;  Laterality: Right;   Patient Active Problem List   Diagnosis Date Noted   History of rectal bleeding 06/01/2018   Left groin pain 03/18/2018   Sacroiliac joint pain 01/13/2017   Abdominal pain, left lower quadrant 08/23/2015   BPH with obstruction/lower urinary tract symptoms 08/23/2015   Biceps tendinitis 06/25/2015    REFERRING DIAG:  M54.12 (ICD-10-CM) - Cervical radiculopathy  M25.511,G89.29 (ICD-10-CM) - Chronic right shoulder pain    THERAPY DIAG:  Cervicalgia  Right shoulder pain, unspecified chronicity  Muscle weakness (generalized)  Rationale for Evaluation and Treatment Rehabilitation  PERTINENT HISTORY: Pt is a 67 year old male s/p R shoulder rotator cuff repair and distal clavicle acromionectomy with Dr. Martha Clan on 08/06/22. Patient has hx of cervical spine pain that limited RTC repair rehab. He has new referral for cervicalgia. He reports some localized pain in top of shoulder with AROM of R arm. He has continuous soreness affecting R shoulder. Dr. Myer Haff is considering ACDF, but his A1C is not well controlled at this time. Patient reports  difficulty with turning his head to L and with flexion/extension. Pain is across upper traps and down his R upper limb with numbness/tingling affecting his R thumb. MR findings of multilevel degenerative disc disease with severe involvement at C5-C6 through C7-T1 levels. foraminal stenosis is greatest at C3-C4 and C7-T1 level with moderate involvement. Eccentric rightward disc protrusion contours the anterior surface right hemicord at C5-C6. MRI  of R shoulder demonstrated no RTC re-tear. No cord compression, cord edema, or syrinx. Patient reports pain is constant. Patient reports he can intermittently get spasms all the way down R side of his back to lumbar region as well. Pt has NCV scheduled for 04/12/23. Intermittent disturbed sleep; he reports using different pillows/positions and this helps.    Pain:  Pain Intensity: Present: 5/10, Best: 2/10, Worst: 8-9/10 Pain location: Pain across upper traps, down R upper limb and to R thumb; "twisting" feeling with spasm  Pain Quality:  dull, ache   Radiating: Yes , radiating down RUE;  Numbness/Tingling: Yes, tingling into R thumb/radial styloid region; feels numb to touch along R radial styloid region  Focal Weakness: Yes, weakness with gripping, intermittently dropping items Aggravating factors: bending over, reaching, lying on R side, sitting unsupported Relieving factors: stretching can help, sitting with roller up towel behind neck 24-hour pain behavior: worse in PM  History of prior neck injury, pain, surgery, or therapy: Yes, previous PT for RCR and for neck pain prior to f/u with physician Falls: Has patient fallen in last 6 months? 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, fishing   [employer] flags (personal history of cancer, h/o spinal tumors, history of compression fracture, chills/fever, night sweats, nausea, vomiting, unrelenting pain): Negative     Precautions: None   Weight Bearing Restrictions: No   Living Environment Lives with: lives with their spouse, wife is at work throughout dya Lives in: House/apartment     Patient Goals: Get neck better, find out what's wrong     PRECAUTIONS: None    SUBJECTIVE:                                                                                                                                                                                      SUBJECTIVE STATEMENT:  Patient reports symptoms are worst around 5-6:00 PM until trying to go to bed. Pt reports using his heating pad for some time before bed  in the evenings. Patient reports pain into whole R upper limb down to his R palm. Patient reports NT into R radial styloid region. Patient reports doing okay after DN last visit. He elects to hold on DN today due to pain experienced during needle insertion at lower cervical paraspinal.    PAIN:  Are you having pain? Yes: NPRS scale: 2/10 across neck     OBJECTIVE: (objective measures completed at initial evaluation unless otherwise dated)   Patient Surveys  FOTO 42, predicted improvement to 16   Cognition Patient is oriented to person, place, and time.  Recent memory is intact.  Remote memory is intact.  Attention span and concentration are intact.  Expressive speech is intact.  Patient's fund of knowledge is within normal limits for educational level.                          Gross Musculoskeletal Assessment Tremor: None Bulk: Normal Tone: Normal     Posture Forward head, mild inc thoracic kyphosis, mild Dowager's hump, rounded shoulders     AROM AROM (Normal range in degrees) AROM 04/08/2023  Cervical  Flexion (50) 30  Extension (80) 30  Right lateral flexion (45) 20  Left lateral flexion (45) 18  Right rotation (85) 55  Left rotation (85) 40  (* = pain; Blank rows = not tested)     Shoulder AROM Shoulder flexion: R 135*, L 163 Shoulder abduction: R 138*, L WNL ER: R 87*, L 92 IR: R 48*, L 70       MMT MMT (out of 5) Right 04/11/2023 Left 04/11/2023         Shoulder   Flexion 3+ 4+  Extension      Abduction 4- 4+  Internal rotation      Horizontal abduction      Horizontal  adduction      Lower Trapezius      Rhomboids             Elbow  Flexion 4+ 5  Extension 5 5  Pronation      Supination             Wrist  Flexion 4+ 5  Extension 4 5  Radial deviation      Ulnar deviation             (* = pain; Blank rows = not tested)   Sensation Grossly intact to light touch bilateral UE as determined by testing dermatomes C2-T2. Proprioception and hot/cold testing deferred on this date.   Reflexes R/L Elbow: 2+/2+  Brachioradialis: 2+/2+  Tricep: 1+/1+ Hoffman's: negative Clonus: negative bilat      Palpation Location LEFT  RIGHT           Suboccipitals 1    Cervical paraspinals 2 1  Upper Trapezius 2 2  Levator Scapulae 1 1  Rhomboid Major/Minor 1    (Blank rows = not tested) Graded on 0-4 scale (0 = no pain, 1 = pain, 2 = pain with wincing/grimacing/flinching, 3 = pain with withdrawal, 4 = unwilling to allow palpation), (Blank rows = not tested)   Repeated Movements Repeated retraction: Increase in tension along mid-cervical spine, no worse or no exacerbation afterward     Passive Accessory Intervertebral Motion Hypomobile sideglide with R to L glide at C4-6 levels, Hypomobile generally throughout C-spine with L to R sideglide      SPECIAL TESTS Spurlings A (ipsilateral lateral flexion/axial compression): R: Positive  L: Negative Distraction Test: Positive for relief  Hoffman Sign (cervical cord compression): R: Negative L: Negative ULTT Median: R: Negative L: Not examined         TODAY'S TREATMENT     Mechanical Traction - for nerve root decompression and pain relief; x 10 minutes with 18-20 lbs with static hold using Saunder's Unit on treatment table     Manual Therapy - for symptom modulation, soft tissue sensitivity and mobility, joint mobility, ROM    DTM/TPR bilateral upper traps; x 5 minutes STM bilateral splenius cervicis/capitis C4-6 x 10 minutes  Cervical spine sideglides, emphasis on R to L; 2 x 30 sec at C4-6  levels  Passive cervical sidebend stretch within pt tolerance with contralateral scapular depression; x 30 sec on each side   *not today* MET for improved L C-spine rotation; x10 with 5-sec antagonist contraction Manual cervical traction in supine; 10 sec on, 10 sec off; x 3 minutes for nerve root decompression and symptom modulation Trigger Point Dry Needling (TDN), unbilled Education performed with patient regarding potential benefit of TDN. Reviewed precautions and risks with patient. Extensive time spent with pt to ensure full understanding of TDN risks. Pt provided verbal consent to treatment. TDN performed to L upper trapezius and L splenius cervicis along C5-6 levels with 0.25 x 40 single needle placements with local twitch response (LTR). Pistoning technique utilized. Acute discomfort with C6 cervical paraspinal needle insertion with no remaining complaints following needle removal. Modestly improved cervical spine rotation to R/L.      Therapeutic Exercise - for improved soft tissue flexibility and extensibility as needed for ROM, repeated movements    AROM Lateral flexion: Right 50%*, Left 50%* Cervical rotation: Right WNL, Left 50%* (pinch) *Indicates pain   Cervical retraction, in supine; with patient overpressure; 2x10   PATIENT EDUCATION: HEP reviewed. We discussed ongoing repeated movement program at home and avoiding ipsilateral pinch/sharp pain with cervical stretching.    *not today* Supine cervical sidebend stretch; x 30 sec ea dir  -pinch with closing on L side Scapular retraction; 2x10, 5 sec     PATIENT EDUCATION:  Education details: see above for patient education details Person educated: Patient Education method: Explanation Education comprehension: verbalized understanding     HOME EXERCISE PROGRAM: Access Code: [email or web address] URL: [email or web address] Date: 04/15/2023 Prepared by: Consuela Mimes  Exercises - Supine Chin Tuck  - 5-6  x daily - 7 x weekly - 1 sets - 10 reps - 1sec hold - Seated Scapular Retraction  - 2 x daily - 7 x weekly - 2 sets - 10 reps - 5sec hold Pt continuing R shoulder AAROM and RTC strengthening from previous episode of care      ASSESSMENT:   CLINICAL IMPRESSION: Pt has mild symptoms this AM, though referred pain and paresthesias persist into R upper limb. Symptoms are worst in the evening at this time. Patient has made modest progress with C-spine AROM. We progressed repeated retraction today to include patient overpressure with minimal symptoms reported in lying following completion of repeated movement. Patient has remaining deficits in cervical spine and R shoulder AROM, C-spine mobility, postural changes, R deltoid/RTC/periscapular strength, R upper limb paresthesias down to C6 dermatome, and L>R upper quarter pain. Patient will benefit from continued skilled therapeutic intervention to address the above deficits as needed for improved function and QoL.      REHAB POTENTIAL: Good   CLINICAL DECISION MAKING: Evolving/moderate complexity   EVALUATION COMPLEXITY: Moderate     GOALS: Goals  reviewed with patient? Yes   SHORT TERM GOALS: Target date: 05/02/2023   Pt will be independent with HEP to improve strength and decrease neck pain to improve pain-free function at home and work. Baseline: 04/08/23: Baseline exercises reviewed.   Goal status: INITIAL     LONG TERM GOALS: Target date: 05/23/2023   Pt will increase FOTO to at least 60 to demonstrate significant improvement in function at home and work related to neck pain  Baseline: 04/08/23: 42 Goal status: INITIAL   2.  Pt will decrease worst neck pain by at least 2 points on the NPRS in order to demonstrate clinically significant reduction in neck pain. Baseline: 04/08/23: 8-9/10 at worst Goal status: INITIAL   3.  Pt will demonstrates R shoulder active range of motion within 10 degrees of opposite upper limb for all planes without  reproduction of pain as needed for reaching, lifting, overhead work, and self-care ADLs     Baseline: 04/08/23: limited R shoulder active flexion, ABD, IR. Goal status: INITIAL   4.  Patient will demonstrate cervical spine AROM WFL without reproduction of pain as needed for scanning environment, overhead activity, driving Baseline: 02/02/80: C-spine AROM limited in all planes of motion Goal status: INITIAL     PLAN: PT FREQUENCY: 1-2x/week   PT DURATION: 6-8 weeks   PLANNED INTERVENTIONS: Therapeutic exercises, Therapeutic activity, Neuromuscular re-education, Balance training, Gait training, Patient/Family education, Joint manipulation, Joint mobilization, Vestibular training, Canalith repositioning, Dry Needling, Electrical stimulation, Spinal manipulation, Spinal mobilization, Cryotherapy, Moist heat, Taping, Traction, Ultrasound, Ionotophoresis 4mg /ml Dexamethasone, and Manual therapy   PLAN FOR NEXT SESSION: Utilize cervical traction, STM, and manual therapy to improve mobility, progress HEP for shoulder ROM and discuss modifications to home program prn. Assess response to repeated retraction and force progression. Progress C-spine ROM as tolerated.    Consuela Mimes, PT, DPT #[id number]  Gertie Exon, PT 05/04/2023, 7:34 AM

## 2023-05-04 NOTE — Telephone Encounter (Signed)
Spoke with patient on phone. Pt states he has not been feeling well and has upper respiratory symptoms. Pt states he did intend on calling the clinic. Pt unable to attend PT today due to sickness.

## 2023-05-06 ENCOUNTER — Ambulatory Visit: Payer: Medicare Other | Admitting: Physical Therapy

## 2023-05-06 DIAGNOSIS — M542 Cervicalgia: Secondary | ICD-10-CM | POA: Diagnosis not present

## 2023-05-06 DIAGNOSIS — M25511 Pain in right shoulder: Secondary | ICD-10-CM

## 2023-05-06 DIAGNOSIS — M6281 Muscle weakness (generalized): Secondary | ICD-10-CM

## 2023-05-06 NOTE — Therapy (Addendum)
OUTPATIENT PHYSICAL THERAPY TREATMENT NOTE   Patient Name: Shawn Atkins MRN: [id number] DOB:Mar 08, 1956, 67 y.o., male Today's Date: 05/06/2023  PCP:  Dortha Kern, MD  REFERRING PROVIDER: [name], MD   END OF SESSION:   PT End of Session - 05/08/23 1142     Visit Number 6    Number of Visits 13    Date for PT Re-Evaluation 05/20/23    Authorization Type [employer] Medicare, VL based on medical necessity    Progress Note Due on Visit 10    PT Start Time 0946    PT Stop Time 1028    PT Time Calculation (min) 42 min    Activity Tolerance Patient tolerated treatment well    Behavior During Therapy WFL for tasks assessed/performed                Past Medical History:  Diagnosis Date   BPH (benign prostatic hypertrophy)    Bronchitis    Chronic prostatitis    Complication of anesthesia    Felt like "couldn't breathe" after triple Hernia surgery   Diabetes mellitus without complication (HCC)    Flank pain    GERD (gastroesophageal reflux disease)    h/o   Gross hematuria    History of kidney stones    HTN (hypertension)    pt states he takes lisinopril for kidney protection due to dm not htn   Inguinal hernia    Nocturia    OSA on CPAP    with 02 2 L   Overweight    PONV (postoperative nausea and vomiting)    only during kidney stone surgery   Spermatocele    Stricture of urethra    Vertigo    1 episode, 6-7 yrs ago   Past Surgical History:  Procedure Laterality Date   BICEPT TENODESIS Right 08/06/2022   Procedure: BICEPS TENODESIS;  Surgeon: Juanell Fairly, MD;  Location: [facility];  Service: Orthopedics;  Laterality: Right;   CATARACT EXTRACTION W/PHACO Left 07/22/2020   Procedure: CATARACT EXTRACTION PHACO AND INTRAOCULAR LENS PLACEMENT (IOC) LEFT DIABETIC 4.14  00:33.0;  Surgeon: Nevada Crane, MD;  Location: [facility];  Service: Ophthalmology;  Laterality: Left;  Diabetic - oral meds   CATARACT EXTRACTION W/PHACO Right 08/12/2020    Procedure: CATARACT EXTRACTION PHACO AND INTRAOCULAR LENS PLACEMENT (IOC) RIGHT DIABETIC;  Surgeon: Nevada Crane, MD;  Location: [facility];  Service: Ophthalmology;  Laterality: Right;  1.99 0:26.2   COLONOSCOPY WITH PROPOFOL N/A 11/20/2015   Procedure: COLONOSCOPY WITH PROPOFOL;  Surgeon: Kieth Brightly, MD;  Location: [facility];  Service: Endoscopy;  Laterality: N/A;   HERNIA REPAIR Bilateral 2014   umbilical and bil inguinal/ Dr Egbert Garibaldi   KIDNEY STONE SURGERY     KNEE ARTHROSCOPY Left    open lithotomy     SHOULDER ARTHROSCOPY WITH OPEN ROTATOR CUFF REPAIR AND DISTAL CLAVICLE ACROMINECTOMY Right 08/06/2022   Procedure: SHOULDER ARTHROSCOPY WITH OPEN ROTATOR CUFF REPAIR AND DISTAL CLAVICLE ACROMINECTOMY;  Surgeon: Juanell Fairly, MD;  Location: [facility];  Service: Orthopedics;  Laterality: Right;   Patient Active Problem List   Diagnosis Date Noted   History of rectal bleeding 06/01/2018   Left groin pain 03/18/2018   Sacroiliac joint pain 01/13/2017   Abdominal pain, left lower quadrant 08/23/2015   BPH with obstruction/lower urinary tract symptoms 08/23/2015   Biceps tendinitis 06/25/2015    REFERRING DIAG:  M54.12 (ICD-10-CM) - Cervical radiculopathy  M25.511,G89.29 (ICD-10-CM) - Chronic right shoulder pain  THERAPY DIAG:  Cervicalgia  Right shoulder pain, unspecified chronicity  Muscle weakness (generalized)  Rationale for Evaluation and Treatment Rehabilitation  PERTINENT HISTORY: Pt is a 67 year old male s/p R shoulder rotator cuff repair and distal clavicle acromionectomy with Dr. Martha Clan on 08/06/22. Patient has hx of cervical spine pain that limited RTC repair rehab. He has new referral for cervicalgia. He reports some localized pain in top of shoulder with AROM of R arm. He has continuous soreness affecting R shoulder. Dr. Myer Haff is considering ACDF, but his A1C is not well controlled at this time. Patient reports difficulty with  turning his head to L and with flexion/extension. Pain is across upper traps and down his R upper limb with numbness/tingling affecting his R thumb. MR findings of multilevel degenerative disc disease with severe involvement at C5-C6 through C7-T1 levels. foraminal stenosis is greatest at C3-C4 and C7-T1 level with moderate involvement. Eccentric rightward disc protrusion contours the anterior surface right hemicord at C5-C6. MRI of R shoulder demonstrated no RTC re-tear. No cord compression, cord edema, or syrinx. Patient reports pain is constant. Patient reports he can intermittently get spasms all the way down R side of his back to lumbar region as well. Pt has NCV scheduled for 04/12/23. Intermittent disturbed sleep; he reports using different pillows/positions and this helps.    Pain:  Pain Intensity: Present: 5/10, Best: 2/10, Worst: 8-9/10 Pain location: Pain across upper traps, down R upper limb and to R thumb; "twisting" feeling with spasm  Pain Quality:  dull, ache   Radiating: Yes , radiating down RUE;  Numbness/Tingling: Yes, tingling into R thumb/radial styloid region; feels numb to touch along R radial styloid region  Focal Weakness: Yes, weakness with gripping, intermittently dropping items Aggravating factors: bending over, reaching, lying on R side, sitting unsupported Relieving factors: stretching can help, sitting with roller up towel behind neck 24-hour pain behavior: worse in PM  History of prior neck injury, pain, surgery, or therapy: Yes, previous PT for RCR and for neck pain prior to f/u with physician Falls: Has patient fallen in last 6 months? 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 Pt reports using his heating pad for some time before bed in the evenings. Patient reports pain into whole R upper limb down to his R palm. Patient reports NT into R radial styloid region. Patient reports doing okay after DN last visit. He elects to hold on DN today due to pain experienced during needle insertion at lower cervical paraspinal.    PAIN:  Are you having pain? Yes: NPRS scale: 2/10 across neck     OBJECTIVE: (objective measures completed at initial evaluation unless otherwise dated)   Patient Surveys  FOTO 42, predicted improvement to 65   Cognition Patient is oriented to person, place, and time.  Recent memory is intact.  Remote memory is intact.  Attention span and concentration are intact.  Expressive speech is intact.  Patient's fund of knowledge is within normal limits for educational level.                          Gross Musculoskeletal Assessment Tremor: None Bulk: Normal Tone: Normal     Posture Forward head, mild inc thoracic kyphosis, mild Dowager's hump, rounded shoulders     AROM AROM (Normal range in degrees) AROM 04/08/2023  Cervical  Flexion (50) 30  Extension (80) 30  Right lateral flexion (45) 20  Left  lateral flexion (45) 18  Right rotation (85) 55  Left rotation (85) 40  (* = pain; Blank rows = not tested)     Shoulder AROM Shoulder flexion: R 135*, L 163 Shoulder abduction: R 138*, L WNL ER: R 87*, L 92 IR: R 48*, L 70       MMT MMT (out of 5) Right 04/11/2023 Left 04/11/2023         Shoulder   Flexion 3+ 4+  Extension      Abduction 4- 4+  Internal rotation      Horizontal abduction      Horizontal adduction      Lower Trapezius      Rhomboids             Elbow  Flexion 4+ 5  Extension 5 5  Pronation      Supination             Wrist  Flexion 4+ 5  Extension 4 5  Radial deviation      Ulnar deviation             (* = pain; Blank rows = not tested)   Sensation Grossly intact to light touch bilateral UE as determined by testing dermatomes C2-T2. Proprioception and hot/cold testing deferred on this date.   Reflexes R/L Elbow: 2+/2+  Brachioradialis: 2+/2+  Tricep: 1+/1+ Hoffman's: negative Clonus: negative bilat      Palpation Location LEFT  RIGHT           Suboccipitals 1    Cervical paraspinals 2 1  Upper Trapezius 2 2  Levator Scapulae 1 1  Rhomboid Major/Minor 1    (Blank rows = not tested) Graded on 0-4 scale (0 = no pain, 1 = pain, 2 = pain with wincing/grimacing/flinching, 3 = pain with withdrawal, 4 = unwilling to allow palpation), (Blank rows = not tested)   Repeated Movements Repeated retraction: Increase in tension along mid-cervical spine, no worse or no exacerbation afterward     Passive Accessory Intervertebral Motion Hypomobile sideglide with R to L  glide at C4-6 levels, Hypomobile generally throughout C-spine with L to R sideglide      SPECIAL TESTS Spurlings A (ipsilateral lateral flexion/axial compression): R: Positive L: Negative Distraction Test: Positive for relief  Hoffman Sign (cervical cord compression): R: Negative L: Negative ULTT Median: R: Negative L: Not examined         TODAY'S TREATMENT     Manual  Therapy - for symptom modulation, soft tissue sensitivity and mobility, joint mobility, ROM   Manual cervical traction in supine; 10 sec on, 10 sec off; x 6 minutes for nerve root decompression and symptom modulation  STM/DTM bilateral splenius cervicis/capitis, bilat upper trapezius C4-6 x 10 minutes TPR R upper trap; x 3 minutes  Cervical spine sideglides, emphasis on R to L; 2 x 30 sec at C4-6 levels  MET for improved L C-spine rotation; x10 with 5-sec antagonist contraction   *not today* Passive cervical sidebend stretch within pt tolerance with contralateral scapular depression; x 30 sec on each side  Trigger Point Dry Needling (TDN), unbilled Education performed with patient regarding potential benefit of TDN. Reviewed precautions and risks with patient. Extensive time spent with pt to ensure full understanding of TDN risks. Pt provided verbal consent to treatment. TDN performed to L upper trapezius and L splenius cervicis along C5-6 levels with 0.25 x 40 single needle placements with local twitch response (LTR). Pistoning technique utilized. Acute discomfort with C6 cervical paraspinal needle insertion with no remaining complaints following needle removal. Modestly improved cervical spine rotation to R/L.      Therapeutic Exercise - for improved soft tissue flexibility and extensibility as needed for ROM, repeated movements    AROM Lateral flexion: Right 30, Left 22* Cervical rotation: Right 72, Left 50* *Indicates pain   Cervical retraction, in supine; with patient overpressure; 1x10  Cervical self-SNAG with towel; for L rotation; 1 x 10  PATIENT EDUCATION: HEP reviewed. We discussed ongoing repeated movement program at home and avoiding ipsilateral pinch/sharp pain with cervical stretching.    *not today* Supine cervical sidebend stretch; x 30 sec ea dir  -pinch with closing on L side Scapular retraction; 2x10, 5 sec     PATIENT EDUCATION:  Education details: see  above for patient education details Person educated: Patient Education method: Explanation Education comprehension: verbalized understanding     HOME EXERCISE PROGRAM: Access Code: [email or web address] URL: [email or web address] Date: 04/15/2023 Prepared by: Consuela Mimes  Exercises - Supine Chin Tuck  - 5-6 x daily - 7 x weekly - 1 sets - 10 reps - 1sec hold - Seated Scapular Retraction  - 2 x daily - 7 x weekly - 2 sets - 10 reps - 5sec hold Pt continuing R shoulder AAROM and RTC strengthening from previous episode of care      ASSESSMENT:   CLINICAL IMPRESSION: Pt has ongoing significant pain in evening with generally mild symptoms when pt is coming into clinic in AM. Patient has ongoing deficits with L sidebend/rotation. Patient has equivocal response with repeated movement with force progression - symptoms largely unchanged with mild L-sided paracervical symptoms during the repeated movement. Pt does respond well with traction and STM/DTM. ROM has modestly improved, but pain with closing on L remains today. Patient has remaining deficits in cervical spine and R shoulder AROM, C-spine mobility, postural changes, R deltoid/RTC/periscapular strength, R upper limb paresthesias down to C6 dermatome, and L>R upper quarter pain. Patient will benefit from continued skilled therapeutic intervention to address the above deficits as needed for improved function  and QoL.      REHAB POTENTIAL: Good   CLINICAL DECISION MAKING: Evolving/moderate complexity   EVALUATION COMPLEXITY: Moderate     GOALS: Goals reviewed with patient? Yes   SHORT TERM GOALS: Target date: 05/02/2023   Pt will be independent with HEP to improve strength and decrease neck pain to improve pain-free function at home and work. Baseline: 04/08/23: Baseline exercises reviewed.   Goal status: INITIAL     LONG TERM GOALS: Target date: 05/23/2023   Pt will increase FOTO to at least 60 to demonstrate significant  improvement in function at home and work related to neck pain  Baseline: 04/08/23: 42 Goal status: INITIAL   2.  Pt will decrease worst neck pain by at least 2 points on the NPRS in order to demonstrate clinically significant reduction in neck pain. Baseline: 04/08/23: 8-9/10 at worst Goal status: INITIAL   3.  Pt will demonstrates R shoulder active range of motion within 10 degrees of opposite upper limb for all planes without reproduction of pain as needed for reaching, lifting, overhead work, and self-care ADLs     Baseline: 04/08/23: limited R shoulder active flexion, ABD, IR. Goal status: INITIAL   4.  Patient will demonstrate cervical spine AROM WFL without reproduction of pain as needed for scanning environment, overhead activity, driving Baseline: 02/02/80: C-spine AROM limited in all planes of motion Goal status: INITIAL     PLAN: PT FREQUENCY: 1-2x/week   PT DURATION: 6-8 weeks   PLANNED INTERVENTIONS: Therapeutic exercises, Therapeutic activity, Neuromuscular re-education, Balance training, Gait training, Patient/Family education, Joint manipulation, Joint mobilization, Vestibular training, Canalith repositioning, Dry Needling, Electrical stimulation, Spinal manipulation, Spinal mobilization, Cryotherapy, Moist heat, Taping, Traction, Ultrasound, Ionotophoresis 4mg /ml Dexamethasone, and Manual therapy   PLAN FOR NEXT SESSION: Utilize cervical traction, STM, and manual therapy to improve mobility, progress HEP for shoulder ROM and discuss modifications to home program prn. Assess response to repeated retraction and force progression. Progress C-spine ROM as tolerated.    Consuela Mimes, PT, DPT #[id number]  Gertie Exon, PT 05/08/2023, 11:42 AM

## 2023-05-08 ENCOUNTER — Encounter: Payer: Self-pay | Admitting: Physical Therapy

## 2023-05-11 ENCOUNTER — Encounter: Payer: Self-pay | Admitting: Physical Therapy

## 2023-05-11 ENCOUNTER — Ambulatory Visit: Payer: Medicare Other | Admitting: Physical Therapy

## 2023-05-11 DIAGNOSIS — M542 Cervicalgia: Secondary | ICD-10-CM | POA: Diagnosis not present

## 2023-05-11 DIAGNOSIS — M6281 Muscle weakness (generalized): Secondary | ICD-10-CM

## 2023-05-11 DIAGNOSIS — M25511 Pain in right shoulder: Secondary | ICD-10-CM

## 2023-05-11 NOTE — Therapy (Signed)
OUTPATIENT PHYSICAL THERAPY TREATMENT NOTE   Patient Name: Shawn Atkins MRN: [id number] DOB:Oct 16, 1956, 67 y.o., male Today's Date: 05/11/2023  PCP:  Dortha Kern, MD  REFERRING PROVIDER: [name], MD   END OF SESSION:   PT End of Session - 05/11/23 1022     Visit Number 7    Number of Visits 13    Date for PT Re-Evaluation 05/20/23    Authorization Type [employer] Medicare, VL based on medical necessity    Progress Note Due on Visit 10    PT Start Time 0947    PT Stop Time 1029    PT Time Calculation (min) 42 min    Activity Tolerance Patient tolerated treatment well    Behavior During Therapy WFL for tasks assessed/performed                 Past Medical History:  Diagnosis Date   BPH (benign prostatic hypertrophy)    Bronchitis    Chronic prostatitis    Complication of anesthesia    Felt like "couldn't breathe" after triple Hernia surgery   Diabetes mellitus without complication (HCC)    Flank pain    GERD (gastroesophageal reflux disease)    h/o   Gross hematuria    History of kidney stones    HTN (hypertension)    pt states he takes lisinopril for kidney protection due to dm not htn   Inguinal hernia    Nocturia    OSA on CPAP    with 02 2 L   Overweight    PONV (postoperative nausea and vomiting)    only during kidney stone surgery   Spermatocele    Stricture of urethra    Vertigo    1 episode, 6-7 yrs ago   Past Surgical History:  Procedure Laterality Date   BICEPT TENODESIS Right 08/06/2022   Procedure: BICEPS TENODESIS;  Surgeon: Juanell Fairly, MD;  Location: [facility];  Service: Orthopedics;  Laterality: Right;   CATARACT EXTRACTION W/PHACO Left 07/22/2020   Procedure: CATARACT EXTRACTION PHACO AND INTRAOCULAR LENS PLACEMENT (IOC) LEFT DIABETIC 4.14  00:33.0;  Surgeon: Nevada Crane, MD;  Location: [facility];  Service: Ophthalmology;  Laterality: Left;  Diabetic - oral meds   CATARACT EXTRACTION W/PHACO Right  08/12/2020   Procedure: CATARACT EXTRACTION PHACO AND INTRAOCULAR LENS PLACEMENT (IOC) RIGHT DIABETIC;  Surgeon: Nevada Crane, MD;  Location: [facility];  Service: Ophthalmology;  Laterality: Right;  1.99 0:26.2   COLONOSCOPY WITH PROPOFOL N/A 11/20/2015   Procedure: COLONOSCOPY WITH PROPOFOL;  Surgeon: Kieth Brightly, MD;  Location: [facility];  Service: Endoscopy;  Laterality: N/A;   HERNIA REPAIR Bilateral 2014   umbilical and bil inguinal/ Dr Egbert Garibaldi   KIDNEY STONE SURGERY     KNEE ARTHROSCOPY Left    open lithotomy     SHOULDER ARTHROSCOPY WITH OPEN ROTATOR CUFF REPAIR AND DISTAL CLAVICLE ACROMINECTOMY Right 08/06/2022   Procedure: SHOULDER ARTHROSCOPY WITH OPEN ROTATOR CUFF REPAIR AND DISTAL CLAVICLE ACROMINECTOMY;  Surgeon: Juanell Fairly, MD;  Location: [facility];  Service: Orthopedics;  Laterality: Right;   Patient Active Problem List   Diagnosis Date Noted   History of rectal bleeding 06/01/2018   Left groin pain 03/18/2018   Sacroiliac joint pain 01/13/2017   Abdominal pain, left lower quadrant 08/23/2015   BPH with obstruction/lower urinary tract symptoms 08/23/2015   Biceps tendinitis 06/25/2015    REFERRING DIAG:  M54.12 (ICD-10-CM) - Cervical radiculopathy  M25.511,G89.29 (ICD-10-CM) - Chronic right shoulder  pain    THERAPY DIAG:  Cervicalgia  Right shoulder pain, unspecified chronicity  Muscle weakness (generalized)  Rationale for Evaluation and Treatment Rehabilitation  PERTINENT HISTORY: Pt is a 67 year old male s/p R shoulder rotator cuff repair and distal clavicle acromionectomy with Dr. Martha Clan on 08/06/22. Patient has hx of cervical spine pain that limited RTC repair rehab. He has new referral for cervicalgia. He reports some localized pain in top of shoulder with AROM of R arm. He has continuous soreness affecting R shoulder. Dr. Myer Haff is considering ACDF, but his A1C is not well controlled at this time. Patient reports  difficulty with turning his head to L and with flexion/extension. Pain is across upper traps and down his R upper limb with numbness/tingling affecting his R thumb. MR findings of multilevel degenerative disc disease with severe involvement at C5-C6 through C7-T1 levels. foraminal stenosis is greatest at C3-C4 and C7-T1 level with moderate involvement. Eccentric rightward disc protrusion contours the anterior surface right hemicord at C5-C6. MRI of R shoulder demonstrated no RTC re-tear. No cord compression, cord edema, or syrinx. Patient reports pain is constant. Patient reports he can intermittently get spasms all the way down R side of his back to lumbar region as well. Pt has NCV scheduled for 04/12/23. Intermittent disturbed sleep; he reports using different pillows/positions and this helps.    Pain:  Pain Intensity: Present: 5/10, Best: 2/10, Worst: 8-9/10 Pain location: Pain across upper traps, down R upper limb and to R thumb; "twisting" feeling with spasm  Pain Quality:  dull, ache   Radiating: Yes , radiating down RUE;  Numbness/Tingling: Yes, tingling into R thumb/radial styloid region; feels numb to touch along R radial styloid region  Focal Weakness: Yes, weakness with gripping, intermittently dropping items Aggravating factors: bending over, reaching, lying on R side, sitting unsupported Relieving factors: stretching can help, sitting with roller up towel behind neck 24-hour pain behavior: worse in PM  History of prior neck injury, pain, surgery, or therapy: Yes, previous PT for RCR and for neck pain prior to f/u with physician Falls: Has patient fallen in last 6 months? 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, painting/home  improvement projects, fishing   [employer] flags (personal history of cancer, h/o spinal tumors, history of compression fracture, chills/fever, night sweats, nausea, vomiting, unrelenting pain): Negative     Precautions: None   Weight Bearing Restrictions: No   Living Environment Lives with: lives with their spouse, wife is at work throughout dya Lives in: House/apartment     Patient Goals: Get neck better, find out what's wrong    PRECAUTIONS: None    SUBJECTIVE:  SUBJECTIVE STATEMENT:  Patient reports significant symptoms since yesterday affecting bilateral cervical paraspinals. He reports previously having numb region along C6 dermatome, but he reports radiating pain and R upper limb shooting pain recently. Pt is compliant with HEP. Most pain in evening, though symptoms are continuous.    PAIN:  Are you having pain? Yes: NPRS scale: 2/10 across neck     OBJECTIVE: (objective measures completed at initial evaluation unless otherwise dated)   Patient Surveys  FOTO 42, predicted improvement to 15   Cognition Patient is oriented to person, place, and time.  Recent memory is intact.  Remote memory is intact.  Attention span and concentration are intact.  Expressive speech is intact.  Patient's fund of knowledge is within normal limits for educational level.                          Gross Musculoskeletal Assessment Tremor: None Bulk: Normal Tone: Normal     Posture Forward head, mild inc thoracic kyphosis, mild Dowager's hump, rounded shoulders     AROM AROM (Normal range in degrees) AROM 04/08/2023  Cervical  Flexion (50) 30  Extension (80) 30  Right lateral flexion (45) 20  Left lateral flexion (45) 18  Right rotation (85) 55  Left rotation (85) 40  (* = pain; Blank rows = not  tested)     Shoulder AROM Shoulder flexion: R 135*, L 163 Shoulder abduction: R 138*, L WNL ER: R 87*, L 92 IR: R 48*, L 70       MMT MMT (out of 5) Right 04/11/2023 Left 04/11/2023         Shoulder   Flexion 3+ 4+  Extension      Abduction 4- 4+  Internal rotation      Horizontal abduction      Horizontal adduction      Lower Trapezius      Rhomboids             Elbow  Flexion 4+ 5  Extension 5 5  Pronation      Supination             Wrist  Flexion 4+ 5  Extension 4 5  Radial deviation      Ulnar deviation             (* = pain; Blank rows = not tested)   Sensation Grossly intact to light touch bilateral UE as determined by testing dermatomes C2-T2. Proprioception and hot/cold testing deferred on this date.   Reflexes R/L Elbow: 2+/2+  Brachioradialis: 2+/2+  Tricep: 1+/1+ Hoffman's: negative Clonus: negative bilat      Palpation Location LEFT  RIGHT           Suboccipitals 1    Cervical paraspinals 2 1  Upper Trapezius 2 2  Levator Scapulae 1 1  Rhomboid Major/Minor 1    (Blank rows = not tested) Graded on 0-4 scale (0 = no pain, 1 = pain, 2 = pain with wincing/grimacing/flinching, 3 = pain with withdrawal, 4 = unwilling to allow palpation), (Blank rows = not tested)   Repeated Movements Repeated retraction: Increase in tension along mid-cervical spine, no worse or no exacerbation afterward     Passive Accessory Intervertebral Motion Hypomobile sideglide with R to L glide at C4-6 levels, Hypomobile generally throughout C-spine with L to R sideglide      SPECIAL TESTS Spurlings A (ipsilateral lateral flexion/axial compression): R: Positive L: Negative Distraction  Test: Positive for relief  Hoffman Sign (cervical cord compression): R: Negative L: Negative ULTT Median: R: Negative L: Not examined         TODAY'S TREATMENT     Manual Therapy - for symptom modulation, soft tissue sensitivity and mobility, joint mobility, ROM   Manual  cervical traction in supine; 10 sec on, 10 sec off; x 8 minutes for nerve root decompression and symptom modulation  STM/DTM bilateral splenius cervicis/capitis, bilat upper trapezius C4-6 x 10 minutes TPR bilat upper trap; x 3 minutes  Cervical spine sideglides, emphasis on R to L; 2 x 30 sec at C4-6 levels    *not today* MET for improved L C-spine rotation; x10 with 5-sec antagonist contraction Passive cervical sidebend stretch within pt tolerance with contralateral scapular depression; x 30 sec on each side  Trigger Point Dry Needling (TDN), unbilled Education performed with patient regarding potential benefit of TDN. Reviewed precautions and risks with patient. Extensive time spent with pt to ensure full understanding of TDN risks. Pt provided verbal consent to treatment. TDN performed to L upper trapezius and L splenius cervicis along C5-6 levels with 0.25 x 40 single needle placements with local twitch response (LTR). Pistoning technique utilized. Acute discomfort with C6 cervical paraspinal needle insertion with no remaining complaints following needle removal. Modestly improved cervical spine rotation to R/L.      Therapeutic Exercise - for improved soft tissue flexibility and extensibility as needed for ROM, repeated movements    AROM (Gross assessment) Flexion: 50% Extension: 50% Cervical rotation: Right 75%, Left 50% *Indicates pain   Cervical retraction with R lateral flexion, repeated; seated; 2x10 Median nerve floss; RUE with elbow/upper arm supported on armrest with pillow; x20   PATIENT EDUCATION: HEP updated and reviewed. We discussed holding on sidebend stretches and updated HEP for nerve flossing.    *not today* Cervical self-SNAG with towel; for L rotation; 1 x 10 Supine cervical sidebend stretch; x 30 sec ea dir  -pinch with closing on L side Scapular retraction; 2x10, 5 sec     PATIENT EDUCATION:  Education details: see above for patient education  details Person educated: Patient Education method: Explanation Education comprehension: verbalized understanding     HOME EXERCISE PROGRAM: Access Code: [email or web address] URL: [email or web address] Date: 05/11/2023 Prepared by: Consuela Mimes  Exercises - Seated Neck Sidebending ROM  - 6 x daily - 7 x weekly - 1 sets - 10 reps - 1-2sec hold - Seated Scapular Retraction  - 2 x daily - 7 x weekly - 2 sets - 10 reps - 5sec hold - Median Nerve Flossing - Tray (Mirrored)  - 2 x daily - 7 x weekly - 2 sets - 10 reps      ASSESSMENT:   CLINICAL IMPRESSION: Patient reports recent exacerbation of pain that began in the morning without known specific cause. Pt feels that he tolerated PT visit well last time. Pt has notable R upper limb pain that is only partly mitigated with use of traction. We investigated frontal plane repeated movement given ongoing severe pain in evening affecting neck and R upper quarter. Pt reports minimal change in upper limb pain with repeated sidebend into R side (side of UE symptoms) today; symptoms are modestly improved post-session, but this can be due to multi-modal intervention today. We will determine response with repeated sidebend R next visit. We initiated neurodynamics today - these are performed with R upper arm supported due to chronic R shoulder/upper arm pain. Patient has remaining deficits in  cervical spine and R shoulder AROM, C-spine mobility, postural changes, R deltoid/RTC/periscapular strength, R upper limb paresthesias down to C6 dermatome, and L>R upper quarter pain. Patient will benefit from continued skilled therapeutic intervention to address the above deficits as needed for improved function and QoL.      REHAB POTENTIAL: Good   CLINICAL DECISION MAKING: Evolving/moderate complexity   EVALUATION COMPLEXITY: Moderate     GOALS: Goals reviewed with patient? Yes   SHORT TERM GOALS: Target date: 05/02/2023   Pt will be independent with  HEP to improve strength and decrease neck pain to improve pain-free function at home and work. Baseline: 04/08/23: Baseline exercises reviewed.   Goal status: INITIAL     LONG TERM GOALS: Target date: 05/23/2023   Pt will increase FOTO to at least 60 to demonstrate significant improvement in function at home and work related to neck pain  Baseline: 04/08/23: 42 Goal status: INITIAL   2.  Pt will decrease worst neck pain by at least 2 points on the NPRS in order to demonstrate clinically significant reduction in neck pain. Baseline: 04/08/23: 8-9/10 at worst Goal status: INITIAL   3.  Pt will demonstrates R shoulder active range of motion within 10 degrees of opposite upper limb for all planes without reproduction of pain as needed for reaching, lifting, overhead work, and self-care ADLs     Baseline: 04/08/23: limited R shoulder active flexion, ABD, IR. Goal status: INITIAL   4.  Patient will demonstrate cervical spine AROM WFL without reproduction of pain as needed for scanning environment, overhead activity, driving Baseline: 8/46/96: C-spine AROM limited in all planes of motion Goal status: INITIAL     PLAN: PT FREQUENCY: 1-2x/week   PT DURATION: 6-8 weeks   PLANNED INTERVENTIONS: Therapeutic exercises, Therapeutic activity, Neuromuscular re-education, Balance training, Gait training, Patient/Family education, Joint manipulation, Joint mobilization, Vestibular training, Canalith repositioning, Dry Needling, Electrical stimulation, Spinal manipulation, Spinal mobilization, Cryotherapy, Moist heat, Taping, Traction, Ultrasound, Ionotophoresis 4mg /ml Dexamethasone, and Manual therapy   PLAN FOR NEXT SESSION: Utilize cervical traction, STM, and manual therapy to improve mobility, progress HEP for shoulder ROM and discuss modifications to home program prn. Assess response to repeated retraction and force progression. Progress C-spine ROM as tolerated.    Consuela Mimes, PT, DPT  #[id number]  Gertie Exon, PT 05/11/2023, 10:23 AM

## 2023-05-13 ENCOUNTER — Ambulatory Visit: Payer: Medicare Other | Admitting: Physical Therapy

## 2023-05-18 ENCOUNTER — Ambulatory Visit: Payer: Medicare Other | Admitting: Physical Therapy

## 2023-05-18 DIAGNOSIS — M25511 Pain in right shoulder: Secondary | ICD-10-CM

## 2023-05-18 DIAGNOSIS — M542 Cervicalgia: Secondary | ICD-10-CM

## 2023-05-18 DIAGNOSIS — M6281 Muscle weakness (generalized): Secondary | ICD-10-CM

## 2023-05-18 NOTE — Therapy (Unsigned)
OUTPATIENT PHYSICAL THERAPY TREATMENT NOTE   Patient Name: Shawn Atkins MRN: [id number] DOB:06-05-1956, 67 y.o., male Today's Date: 05/18/2023  PCP:  Dortha Kern, MD  REFERRING PROVIDER: [name], MD   END OF SESSION:   PT End of Session - 05/18/23 0942     Visit Number 8    Number of Visits 13    Date for PT Re-Evaluation 05/20/23    Authorization Type [employer] Medicare, VL based on medical necessity    Progress Note Due on Visit 10    PT Start Time 0946    PT Stop Time 1028    PT Time Calculation (min) 42 min    Activity Tolerance Patient tolerated treatment well    Behavior During Therapy WFL for tasks assessed/performed             Past Medical History:  Diagnosis Date   BPH (benign prostatic hypertrophy)    Bronchitis    Chronic prostatitis    Complication of anesthesia    Felt like "couldn't breathe" after triple Hernia surgery   Diabetes mellitus without complication (HCC)    Flank pain    GERD (gastroesophageal reflux disease)    h/o   Gross hematuria    History of kidney stones    HTN (hypertension)    pt states he takes lisinopril for kidney protection due to dm not htn   Inguinal hernia    Nocturia    OSA on CPAP    with 02 2 L   Overweight    PONV (postoperative nausea and vomiting)    only during kidney stone surgery   Spermatocele    Stricture of urethra    Vertigo    1 episode, 6-7 yrs ago   Past Surgical History:  Procedure Laterality Date   BICEPT TENODESIS Right 08/06/2022   Procedure: BICEPS TENODESIS;  Surgeon: Juanell Fairly, MD;  Location: [facility];  Service: Orthopedics;  Laterality: Right;   CATARACT EXTRACTION W/PHACO Left 07/22/2020   Procedure: CATARACT EXTRACTION PHACO AND INTRAOCULAR LENS PLACEMENT (IOC) LEFT DIABETIC 4.14  00:33.0;  Surgeon: Nevada Crane, MD;  Location: [facility];  Service: Ophthalmology;  Laterality: Left;  Diabetic - oral meds   CATARACT EXTRACTION W/PHACO Right 08/12/2020    Procedure: CATARACT EXTRACTION PHACO AND INTRAOCULAR LENS PLACEMENT (IOC) RIGHT DIABETIC;  Surgeon: Nevada Crane, MD;  Location: [facility];  Service: Ophthalmology;  Laterality: Right;  1.99 0:26.2   COLONOSCOPY WITH PROPOFOL N/A 11/20/2015   Procedure: COLONOSCOPY WITH PROPOFOL;  Surgeon: Kieth Brightly, MD;  Location: [facility];  Service: Endoscopy;  Laterality: N/A;   HERNIA REPAIR Bilateral 2014   umbilical and bil inguinal/ Dr Egbert Garibaldi   KIDNEY STONE SURGERY     KNEE ARTHROSCOPY Left    open lithotomy     SHOULDER ARTHROSCOPY WITH OPEN ROTATOR CUFF REPAIR AND DISTAL CLAVICLE ACROMINECTOMY Right 08/06/2022   Procedure: SHOULDER ARTHROSCOPY WITH OPEN ROTATOR CUFF REPAIR AND DISTAL CLAVICLE ACROMINECTOMY;  Surgeon: Juanell Fairly, MD;  Location: [facility];  Service: Orthopedics;  Laterality: Right;   Patient Active Problem List   Diagnosis Date Noted   History of rectal bleeding 06/01/2018   Left groin pain 03/18/2018   Sacroiliac joint pain 01/13/2017   Abdominal pain, left lower quadrant 08/23/2015   BPH with obstruction/lower urinary tract symptoms 08/23/2015   Biceps tendinitis 06/25/2015    REFERRING DIAG:  M54.12 (ICD-10-CM) - Cervical radiculopathy  M25.511,G89.29 (ICD-10-CM) - Chronic right shoulder pain  THERAPY DIAG:  Cervicalgia  Right shoulder pain, unspecified chronicity  Muscle weakness (generalized)  Rationale for Evaluation and Treatment Rehabilitation  PERTINENT HISTORY: Pt is a 67 year old male s/p R shoulder rotator cuff repair and distal clavicle acromionectomy with Dr. Martha Clan on 08/06/22. Patient has hx of cervical spine pain that limited RTC repair rehab. He has new referral for cervicalgia. He reports some localized pain in top of shoulder with AROM of R arm. He has continuous soreness affecting R shoulder. Dr. Myer Haff is considering ACDF, but his A1C is not well controlled at this time. Patient reports difficulty with  turning his head to L and with flexion/extension. Pain is across upper traps and down his R upper limb with numbness/tingling affecting his R thumb. MR findings of multilevel degenerative disc disease with severe involvement at C5-C6 through C7-T1 levels. foraminal stenosis is greatest at C3-C4 and C7-T1 level with moderate involvement. Eccentric rightward disc protrusion contours the anterior surface right hemicord at C5-C6. MRI of R shoulder demonstrated no RTC re-tear. No cord compression, cord edema, or syrinx. Patient reports pain is constant. Patient reports he can intermittently get spasms all the way down R side of his back to lumbar region as well. Pt has NCV scheduled for 04/12/23. Intermittent disturbed sleep; he reports using different pillows/positions and this helps.    Pain:  Pain Intensity: Present: 5/10, Best: 2/10, Worst: 8-9/10 Pain location: Pain across upper traps, down R upper limb and to R thumb; "twisting" feeling with spasm  Pain Quality:  dull, ache   Radiating: Yes , radiating down RUE;  Numbness/Tingling: Yes, tingling into R thumb/radial styloid region; feels numb to touch along R radial styloid region  Focal Weakness: Yes, weakness with gripping, intermittently dropping items Aggravating factors: bending over, reaching, lying on R side, sitting unsupported Relieving factors: stretching can help, sitting with roller up towel behind neck 24-hour pain behavior: worse in PM  History of prior neck injury, pain, surgery, or therapy: Yes, previous PT for RCR and for neck pain prior to f/u with physician Falls: Has patient fallen in last 6 months? 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 He reports working on nerve glides at home. Patient reports remaining paresthesias into his R C5 dermatome. Pt reports that his hand isn't as bad as it was previously. He reports pain largely in L upper trapezius this AM. Pt reports disturbed sleep last night. A1C has improved markedly, down to 7.    PAIN:  Are you having pain? Yes: NPRS scale: 2/10 across neck     OBJECTIVE: (objective measures completed at initial evaluation unless otherwise dated)   Patient Surveys  FOTO 42, predicted improvement to 40   Cognition Patient is oriented to person, place, and time.  Recent memory is intact.  Remote memory is intact.  Attention span and concentration are intact.  Expressive speech is intact.  Patient's fund of knowledge is within normal limits for educational level.                          Gross Musculoskeletal Assessment Tremor: None Bulk: Normal Tone: Normal     Posture Forward head, mild inc thoracic kyphosis, mild Dowager's hump, rounded shoulders     AROM AROM (Normal range in degrees) AROM 04/08/2023  Cervical  Flexion (50) 30  Extension (80) 30  Right lateral flexion (45) 20  Left lateral flexion (45) 18  Right rotation (85) 55  Left rotation (85) 40  (* = pain;  Blank rows = not tested)     Shoulder AROM Shoulder flexion: R 135*, L 163 Shoulder abduction: R 138*, L WNL ER: R 87*, L 92 IR: R 48*, L 70       MMT MMT (out of 5) Right 04/11/2023 Left 04/11/2023         Shoulder   Flexion 3+ 4+  Extension      Abduction 4- 4+  Internal rotation      Horizontal abduction      Horizontal adduction      Lower Trapezius      Rhomboids             Elbow  Flexion 4+ 5  Extension 5 5  Pronation      Supination             Wrist  Flexion 4+ 5  Extension 4 5  Radial deviation      Ulnar deviation             (* = pain; Blank rows = not tested)   Sensation Grossly intact to light touch bilateral UE as determined by testing dermatomes C2-T2. Proprioception and hot/cold testing deferred on this date.   Reflexes R/L Elbow: 2+/2+  Brachioradialis: 2+/2+  Tricep: 1+/1+ Hoffman's: negative Clonus: negative bilat      Palpation Location LEFT  RIGHT           Suboccipitals 1    Cervical paraspinals 2 1  Upper Trapezius 2 2  Levator Scapulae 1 1  Rhomboid Major/Minor 1    (Blank rows = not tested) Graded on 0-4 scale (0 = no pain, 1 = pain, 2 = pain with wincing/grimacing/flinching, 3 = pain with withdrawal, 4 = unwilling to allow palpation), (Blank rows = not tested)   Repeated Movements Repeated retraction: Increase in tension along mid-cervical spine, no worse or no exacerbation afterward     Passive Accessory Intervertebral Motion Hypomobile sideglide with R to L glide at C4-6 levels, Hypomobile generally throughout C-spine with L to R sideglide  SPECIAL TESTS Spurlings A (ipsilateral lateral flexion/axial compression): R: Positive L: Negative Distraction Test: Positive for relief  Hoffman Sign (cervical cord compression): R: Negative L: Negative ULTT Median: R: Negative L: Not examined         TODAY'S TREATMENT     F/u with Dr. Myer Haff 06/17/23   Manual Therapy - for symptom modulation, soft tissue  sensitivity and mobility, joint mobility, ROM   Manual cervical traction in supine; 10 sec on, 10 sec off; x 8 minutes for nerve root decompression and symptom modulation  STM/DTM bilateral splenius cervicis/capitis, bilat upper trapezius C4-6 x 10 minutes TPR bilat upper trap; x 3 minutes  Cervical spine sideglides, emphasis on R to L; 2 x 30 sec at C4-6 levels    *not today* MET for improved L C-spine rotation; x10 with 5-sec antagonist contraction Passive cervical sidebend stretch within pt tolerance with contralateral scapular depression; x 30 sec on each side Trigger Point Dry Needling (TDN), unbilled Education performed with patient regarding potential benefit of TDN. Reviewed precautions and risks with patient. Extensive time spent with pt to ensure full understanding of TDN risks. Pt provided verbal consent to treatment. TDN performed to L upper trapezius and L splenius cervicis along C5-6 levels with 0.25 x 40 single needle placements with local twitch response (LTR). Pistoning technique utilized. Acute discomfort with C6 cervical paraspinal needle insertion with no remaining complaints following needle removal. Modestly improved cervical spine rotation to R/L.      Therapeutic Exercise - for improved soft tissue flexibility and extensibility as needed for ROM, repeated movements    AROM (Gross assessment) Flexion: 75% Extension: 75% Cervical rotation: Right WFL, Left 75%* *Indicates pain   Cervical retraction with R lateral flexion, repeated; seated; 2x10 Median nerve floss; RUE with elbow/upper arm supported on armrest with pillow; x20   PATIENT EDUCATION: HEP updated and reviewed. We discussed holding on sidebend stretches and updated HEP for nerve flossing.    *not today* Cervical self-SNAG with towel; for L rotation; 1 x 10 Supine cervical sidebend stretch; x 30 sec ea dir  -pinch with closing on L side Scapular retraction; 2x10, 5 sec     PATIENT EDUCATION:   Education details: see above for patient education details Person educated: Patient Education method: Explanation Education comprehension: verbalized understanding     HOME EXERCISE PROGRAM: Access Code: [email or web address] URL: [email or web address] Date: 05/11/2023 Prepared by: Consuela Mimes  Exercises - Seated Neck Sidebending ROM  - 6 x daily - 7 x weekly - 1 sets - 10 reps - 1-2sec hold - Seated Scapular Retraction  - 2 x daily - 7 x weekly - 2 sets - 10 reps - 5sec hold - Median Nerve Flossing - Tray (Mirrored)  - 2 x daily - 7 x weekly - 2 sets - 10 reps      ASSESSMENT:   CLINICAL IMPRESSION: Patient reports recent exacerbation of pain that began in the morning without known specific cause. Pt feels that he tolerated PT visit well last time. Pt has notable R upper limb pain that is only partly mitigated with use of traction. We investigated frontal plane repeated movement given ongoing severe pain in evening affecting neck and R upper quarter. Pt reports minimal change in upper limb pain with repeated sidebend into R side (side of UE symptoms) today; symptoms are modestly improved post-session, but this can be due to multi-modal intervention today. We will determine response with repeated sidebend R next visit. We initiated neurodynamics today - these  are performed with R upper arm supported due to chronic R shoulder/upper arm pain. Patient has remaining deficits in cervical spine and R shoulder AROM, C-spine mobility, postural changes, R deltoid/RTC/periscapular strength, R upper limb paresthesias down to C6 dermatome, and L>R upper quarter pain. Patient will benefit from continued skilled therapeutic intervention to address the above deficits as needed for improved function and QoL.      REHAB POTENTIAL: Good   CLINICAL DECISION MAKING: Evolving/moderate complexity   EVALUATION COMPLEXITY: Moderate     GOALS: Goals reviewed with patient? Yes   SHORT TERM GOALS:  Target date: 05/02/2023   Pt will be independent with HEP to improve strength and decrease neck pain to improve pain-free function at home and work. Baseline: 04/08/23: Baseline exercises reviewed.   Goal status: INITIAL     LONG TERM GOALS: Target date: 05/23/2023   Pt will increase FOTO to at least 60 to demonstrate significant improvement in function at home and work related to neck pain  Baseline: 04/08/23: 42 Goal status: INITIAL   2.  Pt will decrease worst neck pain by at least 2 points on the NPRS in order to demonstrate clinically significant reduction in neck pain. Baseline: 04/08/23: 8-9/10 at worst Goal status: INITIAL   3.  Pt will demonstrates R shoulder active range of motion within 10 degrees of opposite upper limb for all planes without reproduction of pain as needed for reaching, lifting, overhead work, and self-care ADLs     Baseline: 04/08/23: limited R shoulder active flexion, ABD, IR. Goal status: INITIAL   4.  Patient will demonstrate cervical spine AROM WFL without reproduction of pain as needed for scanning environment, overhead activity, driving Baseline: 8/41/32: C-spine AROM limited in all planes of motion Goal status: INITIAL     PLAN: PT FREQUENCY: 1-2x/week   PT DURATION: 6-8 weeks   PLANNED INTERVENTIONS: Therapeutic exercises, Therapeutic activity, Neuromuscular re-education, Balance training, Gait training, Patient/Family education, Joint manipulation, Joint mobilization, Vestibular training, Canalith repositioning, Dry Needling, Electrical stimulation, Spinal manipulation, Spinal mobilization, Cryotherapy, Moist heat, Taping, Traction, Ultrasound, Ionotophoresis 4mg /ml Dexamethasone, and Manual therapy   PLAN FOR NEXT SESSION: Utilize cervical traction, STM, and manual therapy to improve mobility, progress HEP for shoulder ROM and discuss modifications to home program prn. Assess response to repeated retraction and force progression. Progress C-spine ROM  as tolerated.    Consuela Mimes, PT, DPT #[id number]  Gertie Exon, PT 05/18/2023, 9:42 AM

## 2023-05-19 ENCOUNTER — Encounter: Payer: Self-pay | Admitting: Physical Therapy

## 2023-05-20 ENCOUNTER — Other Ambulatory Visit
Admission: RE | Admit: 2023-05-20 | Discharge: 2023-05-20 | Disposition: A | Discharge status: Home / Self Care | Payer: Medicare Other | Attending: Urology | Admitting: Urology

## 2023-05-20 ENCOUNTER — Ambulatory Visit: Payer: Medicare Other | Admitting: Physical Therapy

## 2023-05-20 DIAGNOSIS — E349 Endocrine disorder, unspecified: Secondary | ICD-10-CM | POA: Diagnosis present

## 2023-05-20 DIAGNOSIS — N401 Enlarged prostate with lower urinary tract symptoms: Secondary | ICD-10-CM | POA: Diagnosis present

## 2023-05-20 DIAGNOSIS — N138 Other obstructive and reflux uropathy: Secondary | ICD-10-CM | POA: Insufficient documentation

## 2023-05-20 DIAGNOSIS — M542 Cervicalgia: Secondary | ICD-10-CM | POA: Diagnosis not present

## 2023-05-20 DIAGNOSIS — M6281 Muscle weakness (generalized): Secondary | ICD-10-CM

## 2023-05-20 DIAGNOSIS — M25511 Pain in right shoulder: Secondary | ICD-10-CM

## 2023-05-20 LAB — PSA: Prostatic Specific Antigen: 0.27 ng/mL (ref 0.00–4.00)

## 2023-05-20 LAB — HEMOGLOBIN AND HEMATOCRIT, BLOOD
HCT: 51.9 % (ref 39.0–52.0)
Hemoglobin: 17.5 g/dL — ABNORMAL HIGH (ref 13.0–17.0)

## 2023-05-20 NOTE — Therapy (Signed)
OUTPATIENT PHYSICAL THERAPY TREATMENT NOTE   Patient Name: Shawn Atkins MRN: [id number] DOB:[birth year], 67 y.o., male Today's Date: 05/20/2023  PCP:  Dortha Kern, MD  REFERRING PROVIDER: [name], MD   END OF SESSION:   PT End of Session - 05/20/23 1036     Visit Number 9    Number of Visits 13    Date for PT Re-Evaluation 05/20/23    Authorization Type [employer] Medicare, VL based on medical necessity    Progress Note Due on Visit 10    PT Start Time 0942    PT Stop Time 1030    PT Time Calculation (min) 48 min    Activity Tolerance Patient tolerated treatment well    Behavior During Therapy WFL for tasks assessed/performed              Past Medical History:  Diagnosis Date   BPH (benign prostatic hypertrophy)    Bronchitis    Chronic prostatitis    Complication of anesthesia    Felt like "couldn't breathe" after triple Hernia surgery   Diabetes mellitus without complication (HCC)    Flank pain    GERD (gastroesophageal reflux disease)    h/o   Gross hematuria    History of kidney stones    HTN (hypertension)    pt states he takes lisinopril for kidney protection due to dm not htn   Inguinal hernia    Nocturia    OSA on CPAP    with 02 2 L   Overweight    PONV (postoperative nausea and vomiting)    only during kidney stone surgery   Spermatocele    Stricture of urethra    Vertigo    1 episode, 6-7 yrs ago   Past Surgical History:  Procedure Laterality Date   BICEPT TENODESIS Right 08/06/2022   Procedure: BICEPS TENODESIS;  Surgeon: Juanell Fairly, MD;  Location: [facility];  Service: Orthopedics;  Laterality: Right;   CATARACT EXTRACTION W/PHACO Left 07/22/2020   Procedure: CATARACT EXTRACTION PHACO AND INTRAOCULAR LENS PLACEMENT (IOC) LEFT DIABETIC 4.14  00:33.0;  Surgeon: Nevada Crane, MD;  Location: [facility];  Service: Ophthalmology;  Laterality: Left;  Diabetic - oral meds   CATARACT EXTRACTION W/PHACO Right 08/12/2020    Procedure: CATARACT EXTRACTION PHACO AND INTRAOCULAR LENS PLACEMENT (IOC) RIGHT DIABETIC;  Surgeon: Nevada Crane, MD;  Location: [facility];  Service: Ophthalmology;  Laterality: Right;  1.99 0:26.2   COLONOSCOPY WITH PROPOFOL N/A 11/20/2015   Procedure: COLONOSCOPY WITH PROPOFOL;  Surgeon: Kieth Brightly, MD;  Location: [facility];  Service: Endoscopy;  Laterality: N/A;   HERNIA REPAIR Bilateral 2014   umbilical and bil inguinal/ Dr Egbert Garibaldi   KIDNEY STONE SURGERY     KNEE ARTHROSCOPY Left    open lithotomy     SHOULDER ARTHROSCOPY WITH OPEN ROTATOR CUFF REPAIR AND DISTAL CLAVICLE ACROMINECTOMY Right 08/06/2022   Procedure: SHOULDER ARTHROSCOPY WITH OPEN ROTATOR CUFF REPAIR AND DISTAL CLAVICLE ACROMINECTOMY;  Surgeon: Juanell Fairly, MD;  Location: [facility];  Service: Orthopedics;  Laterality: Right;   Patient Active Problem List   Diagnosis Date Noted   History of rectal bleeding 06/01/2018   Left groin pain 03/18/2018   Sacroiliac joint pain 01/13/2017   Abdominal pain, left lower quadrant 08/23/2015   BPH with obstruction/lower urinary tract symptoms 08/23/2015   Biceps tendinitis 06/25/2015    REFERRING DIAG:  M54.12 (ICD-10-CM) - Cervical radiculopathy  M25.511,G89.29 (ICD-10-CM) - Chronic right shoulder pain  THERAPY DIAG:  Cervicalgia  Right shoulder pain, unspecified chronicity  Muscle weakness (generalized)  Rationale for Evaluation and Treatment Rehabilitation  PERTINENT HISTORY: Pt is a 67 year old male s/p R shoulder rotator cuff repair and distal clavicle acromionectomy with Dr. Martha Clan on 08/06/22. Patient has hx of cervical spine pain that limited RTC repair rehab. He has new referral for cervicalgia. He reports some localized pain in top of shoulder with AROM of R arm. He has continuous soreness affecting R shoulder. Dr. Myer Haff is considering ACDF, but his A1C is not well controlled at this time. Patient reports difficulty with  turning his head to L and with flexion/extension. Pain is across upper traps and down his R upper limb with numbness/tingling affecting his R thumb. MR findings of multilevel degenerative disc disease with severe involvement at C5-C6 through C7-T1 levels. foraminal stenosis is greatest at C3-C4 and C7-T1 level with moderate involvement. Eccentric rightward disc protrusion contours the anterior surface right hemicord at C5-C6. MRI of R shoulder demonstrated no RTC re-tear. No cord compression, cord edema, or syrinx. Patient reports pain is constant. Patient reports he can intermittently get spasms all the way down R side of his back to lumbar region as well. Pt has NCV scheduled for 04/12/23. Intermittent disturbed sleep; he reports using different pillows/positions and this helps.    Pain:  Pain Intensity: Present: 5/10, Best: 2/10, Worst: 8-9/10 Pain location: Pain across upper traps, down R upper limb and to R thumb; "twisting" feeling with spasm  Pain Quality:  dull, ache   Radiating: Yes , radiating down RUE;  Numbness/Tingling: Yes, tingling into R thumb/radial styloid region; feels numb to touch along R radial styloid region  Focal Weakness: Yes, weakness with gripping, intermittently dropping items Aggravating factors: bending over, reaching, lying on R side, sitting unsupported Relieving factors: stretching can help, sitting with roller up towel behind neck 24-hour pain behavior: worse in PM  History of prior neck injury, pain, surgery, or therapy: Yes, previous PT for RCR and for neck pain prior to f/u with physician Falls: Has patient fallen in last 6 months? 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 He reports ongoing soreness along R side of neck and down his RUE and to radial styloid. He reports more localized pain around neck/UT on L side. Patient reports evenings have been "a little bit better." Patient reports waking up typically 2x per night. Patient reports sleeping 5-6 hours per night, and this is typical for him. Pt reports tolerating home program well, but he has ongoing pulling on his L paracervical region with sidebend to R side.    PAIN:  Are you having pain? Yes: NPRS scale: 1/10 across neck     OBJECTIVE: (objective measures completed at initial evaluation unless otherwise dated)   Patient Surveys  FOTO 42, predicted improvement to 102   Cognition Patient is oriented to person, place, and time.  Recent memory is intact.  Remote memory is intact.  Attention span and concentration are intact.  Expressive speech is intact.  Patient's fund of knowledge is within normal limits for educational level.                          Gross Musculoskeletal Assessment Tremor: None Bulk: Normal Tone: Normal     Posture Forward head, mild inc thoracic kyphosis, mild Dowager's hump, rounded shoulders     AROM AROM (Normal range in degrees) AROM 04/08/2023  Cervical   Flexion (50) 30  Extension (80) 30  Right lateral flexion (45) 20  Left lateral flexion (45) 18  Right rotation (85) 55  Left rotation (85) 40  (* = pain; Blank rows = not tested)     Shoulder AROM Shoulder flexion: R 135*, L 163 Shoulder abduction: R 138*, L WNL ER: R 87*, L 92 IR: R 48*, L 70       MMT MMT (out of 5) Right 04/11/2023 Left 04/11/2023         Shoulder   Flexion 3+ 4+  Extension      Abduction 4- 4+  Internal rotation      Horizontal abduction      Horizontal adduction      Lower Trapezius      Rhomboids             Elbow  Flexion 4+ 5  Extension 5 5  Pronation      Supination             Wrist  Flexion 4+ 5  Extension 4 5  Radial deviation      Ulnar deviation             (* = pain; Blank rows = not tested)   Sensation Grossly intact to light touch bilateral UE as determined by testing dermatomes C2-T2. Proprioception and hot/cold testing deferred on this date.   Reflexes R/L Elbow: 2+/2+  Brachioradialis: 2+/2+  Tricep: 1+/1+ Hoffman's: negative Clonus: negative bilat      Palpation Location LEFT  RIGHT           Suboccipitals 1    Cervical paraspinals 2 1  Upper Trapezius 2 2  Levator Scapulae 1 1  Rhomboid Major/Minor 1    (Blank rows = not tested) Graded on 0-4 scale (0 = no pain, 1 = pain, 2 = pain with wincing/grimacing/flinching, 3 = pain with withdrawal, 4 = unwilling to allow palpation), (Blank rows = not tested)   Repeated Movements Repeated retraction: Increase in tension along mid-cervical spine, no worse or no exacerbation afterward  Passive Accessory Intervertebral Motion Hypomobile sideglide with R to L glide at C4-6 levels, Hypomobile generally throughout C-spine with L to R sideglide      SPECIAL TESTS Spurlings A (ipsilateral lateral flexion/axial compression): R: Positive L: Negative Distraction Test: Positive for relief  Hoffman Sign (cervical cord compression): R: Negative L: Negative ULTT  Median: R: Negative L: Not examined         TODAY'S TREATMENT     F/u with Dr. Myer Haff 06/17/23     Manual Therapy - for symptom modulation, soft tissue sensitivity and mobility, joint mobility, ROM   Manual cervical traction in supine; 10 sec on, 10 sec off; x 8 minutes for nerve root decompression and symptom modulation  STM/DTM bilateral splenius cervicis/capitis C4-6; x 10 minutes  Cervical spine sideglides, emphasis on L to R; 2 x 30 sec at C4-6 levels   MET for improved L C-spine rotation; x10 with 5-sec antagonist contraction   *not today* TPR bilat upper trap; x 3 minutes Passive cervical sidebend stretch within pt tolerance with contralateral scapular depression; x 30 sec on each side Trigger Point Dry Needling (TDN), unbilled Education performed with patient regarding potential benefit of TDN. Reviewed precautions and risks with patient. Extensive time spent with pt to ensure full understanding of TDN risks. Pt provided verbal consent to treatment. TDN performed to L upper trapezius and L splenius cervicis along C5-6 levels with 0.25 x 40 single needle placements with local twitch response (LTR). Pistoning technique utilized. Acute discomfort with C6 cervical paraspinal needle insertion with no remaining complaints following needle removal. Modestly improved cervical spine rotation to R/L.      Therapeutic Exercise - for improved soft tissue flexibility and extensibility as needed for ROM, repeated movements    AROM Lateral flexion: Right 35 , Left 18 Cervical rotation: Right 72, Left 60 *Indicates pain   Median nerve floss; RUE with elbow/upper arm supported on armrest with pillow; x20  Standing Tband mid-row; Black Tband; 2x10  -verbal and tactile cueing for scapular depression versus elevation   PATIENT EDUCATION: HEP reviewed.   *not today* Cervical retraction with R lateral flexion, repeated; seated; 2x10  -no effect during, no significant R arm  pain reported after  -baseline symptoms (numbness into R thumb/radial styloid and pain along R deltoid remain) Cervical self-SNAG with towel; for L rotation; 1 x 10 Supine cervical sidebend stretch; x 30 sec ea dir  -pinch with closing on L side Scapular retraction; 2x10, 5 sec      MHP (unbilled) utilized post-treatment for analgesic effect and improved soft tissue extensibility; x 5 minutes, along posterior C-spine with pt in sitting with upright back support      PATIENT EDUCATION:  Education details: see above for patient education details Person educated: Patient Education method: Explanation Education comprehension: verbalized understanding     HOME EXERCISE PROGRAM: Access Code: [email or web address] URL: [email or web address] Date: 05/11/2023 Prepared by: Consuela Mimes  Exercises - Seated Neck Sidebending ROM  - 6 x daily - 7 x weekly - 1 sets - 10 reps - 1-2sec hold - Seated Scapular Retraction  - 2 x daily - 7 x weekly - 2 sets - 10 reps - 5sec hold - Median Nerve Flossing - Tray (Mirrored)  - 2 x daily - 7 x weekly - 2 sets - 10 reps      ASSESSMENT:   CLINICAL IMPRESSION: Patient exhibits improved ROM overall with good R rotation and L rotation just up to functional ROM. NPRS is fortunately  low-level this AM, but he does have ongoing higher levels of pain in the evening. Neck pain does intermittently disturb sleep, with about two sleep disruptions being typical during the night. Pt has remaining paresthesia affecting R C6 dermatomal distribution and R arm pain. Pt exhibits functional PROM for R shoulder into flexion/ABD/ER/IR. Functional R upper limb use is still hampered by shoulder/arm pain and difficulty accessing his full ROM. Patient has remaining deficits in cervical spine and R shoulder AROM, C-spine mobility, postural changes, R deltoid/RTC/periscapular strength, R upper limb paresthesias down to C6 dermatome, and L>R upper quarter pain. Patient will  benefit from continued skilled therapeutic intervention to address the above deficits as needed for improved function and QoL.    REHAB POTENTIAL: Good   CLINICAL DECISION MAKING: Evolving/moderate complexity   EVALUATION COMPLEXITY: Moderate     GOALS: Goals reviewed with patient? Yes   SHORT TERM GOALS: Target date: 05/02/2023   Pt will be independent with HEP to improve strength and decrease neck pain to improve pain-free function at home and work. Baseline: 04/08/23: Baseline exercises reviewed.   Goal status: INITIAL     LONG TERM GOALS: Target date: 05/23/2023   Pt will increase FOTO to at least 60 to demonstrate significant improvement in function at home and work related to neck pain  Baseline: 04/08/23: 42 Goal status: INITIAL   2.  Pt will decrease worst neck pain by at least 2 points on the NPRS in order to demonstrate clinically significant reduction in neck pain. Baseline: 04/08/23: 8-9/10 at worst Goal status: INITIAL   3.  Pt will demonstrates R shoulder active range of motion within 10 degrees of opposite upper limb for all planes without reproduction of pain as needed for reaching, lifting, overhead work, and self-care ADLs     Baseline: 04/08/23: limited R shoulder active flexion, ABD, IR. Goal status: INITIAL   4.  Patient will demonstrate cervical spine AROM WFL without reproduction of pain as needed for scanning environment, overhead activity, driving Baseline: 1/61/09: C-spine AROM limited in all planes of motion Goal status: INITIAL     PLAN: PT FREQUENCY: 1-2x/week   PT DURATION: 6-8 weeks   PLANNED INTERVENTIONS: Therapeutic exercises, Therapeutic activity, Neuromuscular re-education, Balance training, Gait training, Patient/Family education, Joint manipulation, Joint mobilization, Vestibular training, Canalith repositioning, Dry Needling, Electrical stimulation, Spinal manipulation, Spinal mobilization, Cryotherapy, Moist heat, Taping, Traction,  Ultrasound, Ionotophoresis 4mg /ml Dexamethasone, and Manual therapy   PLAN FOR NEXT SESSION: Utilize cervical traction, STM, and manual therapy to improve mobility, progress HEP for shoulder ROM and discuss modifications to home program prn. Assess response to repeated movement (lateral component MDT at this time) and utilize force progression prn. Progress C-spine ROM as tolerated.    Consuela Mimes, PT, DPT #[id number]  Gertie Exon, PT 05/20/2023, 10:37 AM

## 2023-05-21 NOTE — Progress Notes (Signed)
05/24/23 9:55 AM   Shawn Atkins 17-Oct-1956 [id number]  Referring provider:  Dortha Kern, MD [address] [place],  [place] [address]  Urological history: 1. BPH with LU TS -PSA (04/2023) 0.27 -tadalafil 5 mg daily  2. Nocturia -Risk factors for nocturia: hypertension and BPH.     3. ED -contributing factors of age, BPH, testosterone deficiency and DM -tadalafil 5 mg daily    4. High risk hematuria -Former smoker -CTU 2014  left kidney demonstrates focal wedge-shaped indentation with near complete cortical loss within the lateral aspect of the mid pole of the kidney. A focal cyst is identified in this  area demonstrating Hounsfield units of 1 and 7 and measures approximately 1 cm -no cystoscopy for unknown reasons   5. Nephrolithiasis -contrast CT in 2016 notes 2 mm right mid kidney nonobstructive calculus. 1-2 mm right kidney lower pole nonobstructive calculus. Potential 1 mm left kidney lower pole nonobstructive calculus -no stones seen on contrast CT in 2019  6. Prostate cancer screening -PSA (04/2023) 0.27 -AUA guidelines (2023) recommend screening for prostate cancer in men ages 68 to 12 every 2 to 4 years -no family history of prostate cancer, breast cancer or ovarian cancer  -he will have yearly screening secondary to TRT  7. Testosterone deficiency -contributing factors of age, diabetes and obesity -testosterone level (04/2023) 577 -hemoglobin/hematocrit (04/2023) 17.5/51.9 -Testosterone cypionate 200/milliliters, 1 cc every 14 days  Chief Complaint  Patient presents with   Benign Prostatic Hypertrophy     HPI: Shawn Atkins is a 67 y.o.male who presents today follow up.     Previous records reviewed  I PSS 9/2  He has no urinary complaints.  Patient denies any modifying or aggravating factors.  Patient denies any recent UTI's, gross hematuria, dysuria or suprapubic/flank pain.  Patient denies any fevers, chills, nausea or vomiting.   He is taking the  tadalafil 5 mg daily.     IPSS     Row Name 05/24/23 0900         International Prostate Symptom Score   How often have you had the sensation of not emptying your bladder? Less than 1 in 5     How often have you had to urinate less than every two hours? About half the time     How often have you found you stopped and started again several times when you urinated? Less than 1 in 5 times     How often have you found it difficult to postpone urination? Less than 1 in 5 times     How often have you had a weak urinary stream? Less than 1 in 5 times     How often have you had to strain to start urination? Less than 1 in 5 times     How many times did you typically get up at night to urinate? 1 Time     Total IPSS Score 9       Quality of Life due to urinary symptoms   If you were to spend the rest of your life with your urinary condition just the way it is now how would you feel about that? Mostly Satisfied              IPSS     Row Name 05/24/23 0900         International Prostate Symptom Score   How often have you had the sensation of not emptying your bladder? Less than 1 in 5  How often have you had to urinate less than every two hours? About half the time     How often have you found you stopped and started again several times when you urinated? Less than 1 in 5 times     How often have you found it difficult to postpone urination? Less than 1 in 5 times     How often have you had a weak urinary stream? Less than 1 in 5 times     How often have you had to strain to start urination? Less than 1 in 5 times     How many times did you typically get up at night to urinate? 1 Time     Total IPSS Score 9       Quality of Life due to urinary symptoms   If you were to spend the rest of your life with your urinary condition just the way it is now how would you feel about that? Mostly Satisfied              Score:  1-7 Mild 8-19 Moderate 20-35 Severe    SHIM  20  Patient still having spontaneous erections.   He denies any pain or curvature with erections.  He is taking the tadalafil 5 mg daily.  He and his wife were able to have intercourse recently.    SHIM     Row Name 05/24/23 0929         SHIM: Over the last 6 months:   How do you rate your confidence that you could get and keep an erection? Moderate     When you had erections with sexual stimulation, how often were your erections hard enough for penetration (entering your partner)? Most Times (much more than half the time)     During sexual intercourse, how often were you able to maintain your erection after you had penetrated (entered) your partner? Most Times (much more than half the time)     During sexual intercourse, how difficult was it to maintain your erection to completion of intercourse? Slightly Difficult     When you attempted sexual intercourse, how often was it satisfactory for you? Almost Always or Always       SHIM Total Score   SHIM 20              Score: 1-7 Severe ED 8-11 Moderate ED 12-16 Mild-Moderate ED 17-21 Mild ED 22-25 No ED     PMH: Past Medical History:  Diagnosis Date   BPH (benign prostatic hypertrophy)    Bronchitis    Chronic prostatitis    Complication of anesthesia    Felt like "couldn't breathe" after triple Hernia surgery   Diabetes mellitus without complication (HCC)    Flank pain    GERD (gastroesophageal reflux disease)    h/o   Gross hematuria    History of kidney stones    HTN (hypertension)    pt states he takes lisinopril for kidney protection due to dm not htn   Inguinal hernia    Nocturia    OSA on CPAP    with 02 2 L   Overweight    PONV (postoperative nausea and vomiting)    only during kidney stone surgery   Spermatocele    Stricture of urethra    Vertigo    1 episode, 6-7 yrs ago    Surgical History: Past Surgical History:  Procedure Laterality Date   BICEPT TENODESIS Right 08/06/2022   Procedure:  BICEPS TENODESIS;  Surgeon: Juanell Fairly, MD;  Location: [facility];  Service: Orthopedics;  Laterality: Right;   CATARACT EXTRACTION W/PHACO Left 07/22/2020   Procedure: CATARACT EXTRACTION PHACO AND INTRAOCULAR LENS PLACEMENT (IOC) LEFT DIABETIC 4.14  00:33.0;  Surgeon: Nevada Crane, MD;  Location: [facility];  Service: Ophthalmology;  Laterality: Left;  Diabetic - oral meds   CATARACT EXTRACTION W/PHACO Right 08/12/2020   Procedure: CATARACT EXTRACTION PHACO AND INTRAOCULAR LENS PLACEMENT (IOC) RIGHT DIABETIC;  Surgeon: Nevada Crane, MD;  Location: [facility];  Service: Ophthalmology;  Laterality: Right;  1.99 0:26.2   COLONOSCOPY WITH PROPOFOL N/A 11/20/2015   Procedure: COLONOSCOPY WITH PROPOFOL;  Surgeon: Kieth Brightly, MD;  Location: [facility];  Service: Endoscopy;  Laterality: N/A;   HERNIA REPAIR Bilateral 2014   umbilical and bil inguinal/ Dr Egbert Garibaldi   KIDNEY STONE SURGERY     KNEE ARTHROSCOPY Left    open lithotomy     SHOULDER ARTHROSCOPY WITH OPEN ROTATOR CUFF REPAIR AND DISTAL CLAVICLE ACROMINECTOMY Right 08/06/2022   Procedure: SHOULDER ARTHROSCOPY WITH OPEN ROTATOR CUFF REPAIR AND DISTAL CLAVICLE ACROMINECTOMY;  Surgeon: Juanell Fairly, MD;  Location: [facility];  Service: Orthopedics;  Laterality: Right;    Home Medications:  Allergies as of 05/24/2023       Reactions   Penicillins Anaphylaxis   Morphine And Codeine Nausea And Vomiting        Medication List        Accurate as of May 24, 2023  9:55 AM. If you have any questions, ask your nurse or doctor.          2-3CC SYRINGE 3 ML Misc 1 mg by Does not apply route every 14 (fourteen) days.   albuterol (2.5 MG/3ML) 0.083% nebulizer solution Commonly known as: PROVENTIL Take 2.5 mg by nebulization every 6 (six) hours as needed for wheezing or shortness of breath (Last used in the winter of 2022).   atorvastatin 20 MG tablet Commonly known as: LIPITOR Take 20 mg by  mouth every evening.   BD Disp Needles 18G X 1-1/2" Misc Generic drug: NEEDLE (DISP) 18 G 1 mg by Does not apply route every 14 (fourteen) days.   BD Disp Needles 21G X 1-1/2" Misc Generic drug: NEEDLE (DISP) 21 G 1 mg by Does not apply route every 14 (fourteen) days.   BIOTIN PO Take 1 tablet by mouth daily.   glucosamine-chondroitin 500-400 MG tablet Take 1 tablet by mouth 2 (two) times daily.   lisinopril 10 MG tablet Commonly known as: ZESTRIL Take 10 mg by mouth every morning.   metFORMIN 500 MG 24 hr tablet Commonly known as: GLUCOPHAGE-XR Take 1,000 mg by mouth 2 (two) times daily with a meal.   multivitamin tablet Take 1 tablet by mouth daily.   ONE TOUCH ULTRA 2 w/Device Kit SMARTSIG:Via Meter   OneTouch Delica Plus Lancet33G Misc Apply 1 each topically daily.   OneTouch Ultra Test test strip Generic drug: glucose blood USE 1 STRIP TO CHECK GLUCOSE ONCE DAILY   Ozempic (0.25 or 0.5 MG/DOSE) 2 MG/3ML Sopn Generic drug: Semaglutide(0.25 or 0.5MG /DOS) Inject 0.5 mg into the skin once a week.   pioglitazone 15 MG tablet Commonly known as: ACTOS Take 15 mg by mouth daily.   tadalafil 5 MG tablet Commonly known as: CIALIS Take 1 tablet (5 mg total) by mouth every morning. BPH   testosterone cypionate 200 MG/ML injection Commonly known as: DEPOTESTOSTERONE CYPIONATE Inject 1 mL (200 mg total) into  the muscle every 14 (fourteen) days. INJECT 1 ML INTO MUSCLE EVERY 14 DAYS What changed: See the new instructions. Changed by: Michiel Cowboy        Allergies:  Allergies  Allergen Reactions   Penicillins Anaphylaxis   Morphine And Codeine Nausea And Vomiting    Family History: Family History  Problem Relation Age of Onset   Benign prostatic hyperplasia Father    Kidney disease Neg Hx    Prostate cancer Neg Hx     Social History:  reports that he quit smoking about 39 years ago. His smoking use included cigarettes. He started smoking about 54  years ago. He has a 15 pack-year smoking history. He has been exposed to tobacco smoke. His smokeless tobacco use includes snuff. He reports current alcohol use of about 1.0 standard drink of alcohol per week. He reports that he does not use drugs.   Physical Exam: BP (!) 152/76 (BP Location: Left Arm, Patient Position: Sitting, Cuff Size: Normal)   Pulse 80   Ht 5\' 9"  (1.753 m)   Wt 205 lb (93 kg)   BMI 30.27 kg/m   Constitutional:  Well nourished. Alert and oriented, No acute distress. HEENT: [place] AT, moist mucus membranes.  Trachea midline Cardiovascular: No clubbing, cyanosis, or edema. Respiratory: Normal respiratory effort, no increased work of breathing. Neurologic: Grossly intact, no focal deficits, moving all 4 extremities. Psychiatric: Normal mood and affect.   Laboratory Data: Results for orders placed or performed during the hospital encounter of 05/20/23  Hemoglobin and hematocrit, blood  Result Value Ref Range   Hemoglobin 17.5 (H) 13.0 - 17.0 g/dL   HCT 16.1 09.6 - 04.5 %  PSA  Result Value Ref Range   Prostatic Specific Antigen 0.27 0.00 - 4.00 ng/mL  Testosterone  Result Value Ref Range   Testosterone 577 264 - 916 ng/dL   I have reviewed the labs.   Pertinent Imaging N/A  Assessment & Plan:    1. Testosterone deficiency  -testosterone levels are therapeutic -H & H WNL -we did discuss that we need to keep an eye on his hemoglobin and hematocrit and may want to consider donating blood to keep his levels within normal limits -Continue testosterone cypionate 200 mg/milliliters, 1 cc every 14 days   2. BPH with LUTS -PSA stable  -symptoms - frequency  -continue conservative management, avoiding bladder irritants and timed voiding's -Continue Cialis 5 mg daily   3. ED -at goal with tadalafil 5 mg   Return in about 3 months (around 08/24/2023) for lab visit .  Cloretta Ned   [facility] [address] [place], [place] [address] [phone]

## 2023-05-24 ENCOUNTER — Ambulatory Visit: Payer: Medicare Other | Admitting: Urology

## 2023-05-24 ENCOUNTER — Encounter: Payer: Self-pay | Admitting: Urology

## 2023-05-24 VITALS — BP 152/76 | HR 80 | Ht 69.0 in | Wt 205.0 lb

## 2023-05-24 DIAGNOSIS — N401 Enlarged prostate with lower urinary tract symptoms: Secondary | ICD-10-CM

## 2023-05-24 DIAGNOSIS — N529 Male erectile dysfunction, unspecified: Secondary | ICD-10-CM

## 2023-05-24 DIAGNOSIS — E291 Testicular hypofunction: Secondary | ICD-10-CM

## 2023-05-24 DIAGNOSIS — E349 Endocrine disorder, unspecified: Secondary | ICD-10-CM

## 2023-05-24 MED ORDER — TESTOSTERONE CYPIONATE 200 MG/ML IM SOLN: 200.0000 mg | INTRAMUSCULAR | 0 refills | Status: DC

## 2023-05-24 NOTE — Patient Instructions (Signed)
In 3 months, please come into the [facility] outpatient laboratory to have your testosterone levels, hemoglobin and hematocrit drawn on the Monday you do not inject the testosterone.

## 2023-05-25 ENCOUNTER — Ambulatory Visit: Payer: Medicare Other | Admitting: Physical Therapy

## 2023-05-25 ENCOUNTER — Encounter: Payer: Self-pay | Admitting: Physical Therapy

## 2023-05-25 DIAGNOSIS — M542 Cervicalgia: Secondary | ICD-10-CM

## 2023-05-25 DIAGNOSIS — M25511 Pain in right shoulder: Secondary | ICD-10-CM

## 2023-05-25 DIAGNOSIS — M6281 Muscle weakness (generalized): Secondary | ICD-10-CM

## 2023-05-25 NOTE — Therapy (Unsigned)
OUTPATIENT PHYSICAL THERAPY TREATMENT AND PROGRESS NOTE   Dates of reporting period  04/08/23   to   05/25/23   Patient Name: Shawn Atkins MRN: [id number] DOB:05-11-1956, 67 y.o., male Today's Date: 05/25/2023   END OF SESSION:   PT End of Session - 05/25/23 0950     Visit Number 10    Number of Visits 13    Date for PT Re-Evaluation 06/24/23    Authorization Type [employer] Medicare, VL based on medical necessity    Progress Note Due on Visit 10    PT Start Time 0948    PT Stop Time 1030    PT Time Calculation (min) 42 min    Activity Tolerance Patient tolerated treatment well    Behavior During Therapy WFL for tasks assessed/performed               Past Medical History:  Diagnosis Date   BPH (benign prostatic hypertrophy)    Bronchitis    Chronic prostatitis    Complication of anesthesia    Felt like "couldn't breathe" after triple Hernia surgery   Diabetes mellitus without complication (HCC)    Flank pain    GERD (gastroesophageal reflux disease)    h/o   Gross hematuria    History of kidney stones    HTN (hypertension)    pt states he takes lisinopril for kidney protection due to dm not htn   Inguinal hernia    Nocturia    OSA on CPAP    with 02 2 L   Overweight    PONV (postoperative nausea and vomiting)    only during kidney stone surgery   Spermatocele    Stricture of urethra    Vertigo    1 episode, 6-7 yrs ago   Past Surgical History:  Procedure Laterality Date   BICEPT TENODESIS Right 08/06/2022   Procedure: BICEPS TENODESIS;  Surgeon: Juanell Fairly, MD;  Location: [facility];  Service: Orthopedics;  Laterality: Right;   CATARACT EXTRACTION W/PHACO Left 07/22/2020   Procedure: CATARACT EXTRACTION PHACO AND INTRAOCULAR LENS PLACEMENT (IOC) LEFT DIABETIC 4.14  00:33.0;  Surgeon: Nevada Crane, MD;  Location: [facility];  Service: Ophthalmology;  Laterality: Left;  Diabetic - oral meds   CATARACT EXTRACTION W/PHACO Right 08/12/2020    Procedure: CATARACT EXTRACTION PHACO AND INTRAOCULAR LENS PLACEMENT (IOC) RIGHT DIABETIC;  Surgeon: Nevada Crane, MD;  Location: [facility];  Service: Ophthalmology;  Laterality: Right;  1.99 0:26.2   COLONOSCOPY WITH PROPOFOL N/A 11/20/2015   Procedure: COLONOSCOPY WITH PROPOFOL;  Surgeon: Kieth Brightly, MD;  Location: [facility];  Service: Endoscopy;  Laterality: N/A;   HERNIA REPAIR Bilateral 2014   umbilical and bil inguinal/ Dr Egbert Garibaldi   KIDNEY STONE SURGERY     KNEE ARTHROSCOPY Left    open lithotomy     SHOULDER ARTHROSCOPY WITH OPEN ROTATOR CUFF REPAIR AND DISTAL CLAVICLE ACROMINECTOMY Right 08/06/2022   Procedure: SHOULDER ARTHROSCOPY WITH OPEN ROTATOR CUFF REPAIR AND DISTAL CLAVICLE ACROMINECTOMY;  Surgeon: Juanell Fairly, MD;  Location: [facility];  Service: Orthopedics;  Laterality: Right;   Patient Active Problem List   Diagnosis Date Noted   History of rectal bleeding 06/01/2018   Left groin pain 03/18/2018   Sacroiliac joint pain 01/13/2017   Abdominal pain, left lower quadrant 08/23/2015   BPH with obstruction/lower urinary tract symptoms 08/23/2015   Biceps tendinitis 06/25/2015    PCP:  Dortha Kern, MD  REFERRING PROVIDER: [name], MD  REFERRING DIAG:  M54.12 (ICD-10-CM) - Cervical radiculopathy  M25.511,G89.29 (ICD-10-CM) - Chronic right shoulder pain    THERAPY DIAG:  Cervicalgia  Right shoulder pain, unspecified chronicity  Muscle weakness (generalized)  Rationale for Evaluation and Treatment Rehabilitation  PERTINENT HISTORY: Pt is a 67 year old male s/p R shoulder rotator cuff repair and distal clavicle acromionectomy with Dr. Martha Clan on 08/06/22. Patient has hx of cervical spine pain that limited RTC repair rehab. He has new referral for cervicalgia. He reports some localized pain in top of shoulder with AROM of R arm. He has continuous soreness affecting R shoulder. Dr. Myer Haff is considering ACDF, but his A1C  is not well controlled at this time. Patient reports difficulty with turning his head to L and with flexion/extension. Pain is across upper traps and down his R upper limb with numbness/tingling affecting his R thumb. MR findings of multilevel degenerative disc disease with severe involvement at C5-C6 through C7-T1 levels. foraminal stenosis is greatest at C3-C4 and C7-T1 level with moderate involvement. Eccentric rightward disc protrusion contours the anterior surface right hemicord at C5-C6. MRI of R shoulder demonstrated no RTC re-tear. No cord compression, cord edema, or syrinx. Patient reports pain is constant. Patient reports he can intermittently get spasms all the way down R side of his back to lumbar region as well. Pt has NCV scheduled for 04/12/23. Intermittent disturbed sleep; he reports using different pillows/positions and this helps.    Pain:  Pain Intensity: Present: 5/10, Best: 2/10, Worst: 8-9/10 Pain location: Pain across upper traps, down R upper limb and to R thumb; "twisting" feeling with spasm  Pain Quality:  dull, ache   Radiating: Yes , radiating down RUE;  Numbness/Tingling: Yes, tingling into R thumb/radial styloid region; feels numb to touch along R radial styloid region  Focal Weakness: Yes, weakness with gripping, intermittently dropping items Aggravating factors: bending over, reaching, lying on R side, sitting unsupported Relieving factors: stretching can help, sitting with roller up towel behind neck 24-hour pain behavior: worse in PM  History of prior neck injury, pain, surgery, or therapy: Yes, previous PT for RCR and for neck pain prior to f/u with physician Falls: Has patient fallen in last 6 months? 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, fishing   [employer] flags (personal history of cancer, h/o spinal tumors, history of compression fracture, chills/fever, night sweats, nausea, vomiting, unrelenting pain): Negative     Precautions: None   Weight Bearing Restrictions: No   Living Environment Lives with: lives with their spouse, wife is at work throughout dya Lives in: House/apartment     Patient Goals: Get neck better, find out what's wrong    PRECAUTIONS: None      OBJECTIVE: (objective measures completed at initial evaluation unless otherwise dated)   Patient Surveys  FOTO 42, predicted improvement to 43   Cognition Patient is oriented to person, place, and time.  Recent memory is intact.  Remote memory is intact.  Attention span and concentration are intact.  Expressive speech is intact.  Patient's fund of knowledge is within normal limits for educational level.  Gross Musculoskeletal Assessment Tremor: None Bulk: Normal Tone: Normal     Posture Forward head, mild inc thoracic kyphosis, mild Dowager's hump, rounded shoulders     AROM AROM (Normal range in degrees) AROM 04/08/2023 AROM 05/25/23  Cervical   Flexion (50) 30 45  Extension (80) 30 48  Right lateral flexion (45) 20 24  Left lateral flexion (45) 18 18*  Right rotation (85) 55 70  Left rotation (85) 40 52*  (* = pain; Blank rows = not tested)     Shoulder AROM IE Shoulder flexion: R 135*, L 163 Shoulder abduction: R 138*, L WNL ER: R 87*, L 92 IR: R 48*, L 70    05/25/23 Shoulder flexion: R 132*, L 155 Shoulder abduction: R 147*, L 170 ER: R WNL*, L 92 IR: R WFL*, L 70        MMT MMT (out of 5) Right 04/11/2023 Left 04/11/2023         Shoulder   Flexion 3+ 4+  Extension      Abduction 4- 4+  Internal rotation      Horizontal abduction      Horizontal adduction      Lower Trapezius      Rhomboids             Elbow   Flexion 4+ 5  Extension 5 5  Pronation      Supination             Wrist  Flexion 4+ 5  Extension 4 5  Radial deviation      Ulnar deviation             (* = pain; Blank rows = not tested)   Sensation Grossly intact to light touch bilateral UE as determined by testing dermatomes C2-T2. Proprioception and hot/cold testing deferred on this date.   Reflexes R/L Elbow: 2+/2+  Brachioradialis: 2+/2+  Tricep: 1+/1+ Hoffman's: negative Clonus: negative bilat      Palpation Location LEFT  RIGHT           Suboccipitals 1    Cervical paraspinals 2 1  Upper Trapezius 2 2  Levator Scapulae 1 1  Rhomboid Major/Minor 1    (Blank rows = not tested) Graded on 0-4 scale (0 = no pain, 1 = pain, 2 = pain with wincing/grimacing/flinching, 3 = pain with withdrawal, 4 = unwilling to allow palpation), (Blank rows = not tested)   Repeated Movements Repeated retraction: Increase in tension along mid-cervical spine, no worse or no exacerbation afterward     Passive Accessory Intervertebral Motion Hypomobile sideglide with R to L glide at C4-6 levels, Hypomobile generally throughout C-spine with L to R sideglide      SPECIAL TESTS Spurlings A (ipsilateral lateral flexion/axial compression): R: Positive L: Negative Distraction Test: Positive for relief  Hoffman Sign (cervical cord compression): R: Negative L: Negative ULTT Median: R: Negative L: Not examined         TODAY'S TREATMENT      SUBJECTIVE STATEMENT:  Patient reports pain at night is about the same. He reports minor symptoms at arrival; not as bad this time of the morning. Patient reports 1/10 pain at arrival. Patient reports pain up to 4/10 in evening. Patient reports compliance with repeated movement program and neurodynamics. He feels he has made notable progress to date.     F/u with Dr. Myer Haff 06/17/23    Therapeutic Exercise - for improved soft tissue flexibility and extensibility as needed for ROM,  repeated  movements    *GOAL UPDATE PERFORMED   PATIENT EDUCATION: Discussed current progress with PT, prognosis, continued POC. Reviewed HEP and encouraged ongoing work on lateral-component MDT at home (repeated lateral flexion toward side of peripheral symptoms) and neurodynamics.     *next visit* Standing Tband mid-row; Black Tband; 2x10  -verbal and tactile cueing for scapular depression versus elevation    *not today* Median nerve floss; RUE with elbow/upper arm supported on armrest with pillow; x20 Cervical retraction with R lateral flexion, repeated; seated; 2x10  -no effect during, no significant R arm pain reported after  -baseline symptoms (numbness into R thumb/radial styloid and pain along R deltoid remain) Cervical self-SNAG with towel; for L rotation; 1 x 10 Supine cervical sidebend stretch; x 30 sec ea dir  -pinch with closing on L side Scapular retraction; 2x10, 5 sec    Manual Therapy - for symptom modulation, soft tissue sensitivity and mobility, joint mobility, ROM   Manual cervical traction in supine; 10 sec on, 10 sec off; x 5 minutes for nerve root decompression and symptom modulation  STM/DTM bilateral splenius cervicis/capitis C4-6; x 10 minutes  Manual traction with L rotation up to pain tolerance; x 10 oscillations with maintaining traction  Cervical spine sideglides, emphasis on L to R; 2 x 30 sec at C4-6 levels    *not today* MET for improved L C-spine rotation; x10 with 5-sec antagonist contraction TPR bilat upper trap; x 3 minutes Passive cervical sidebend stretch within pt tolerance with contralateral scapular depression; x 30 sec on each side Trigger Point Dry Needling (TDN), unbilled Education performed with patient regarding potential benefit of TDN. Reviewed precautions and risks with patient. Extensive time spent with pt to ensure full understanding of TDN risks. Pt provided verbal consent to treatment. TDN performed to L upper trapezius and L  splenius cervicis along C5-6 levels with 0.25 x 40 single needle placements with local twitch response (LTR). Pistoning technique utilized. Acute discomfort with C6 cervical paraspinal needle insertion with no remaining complaints following needle removal. Modestly improved cervical spine rotation to R/L.      PATIENT EDUCATION:  Education details: see above for patient education details Person educated: Patient Education method: Explanation Education comprehension: verbalized understanding     HOME EXERCISE PROGRAM: Access Code: [email or web address] URL: [email or web address] Date: 05/11/2023 Prepared by: Consuela Mimes  Exercises - Seated Neck Sidebending ROM  - 6 x daily - 7 x weekly - 1 sets - 10 reps - 1-2sec hold - Seated Scapular Retraction  - 2 x daily - 7 x weekly - 2 sets - 10 reps - 5sec hold - Median Nerve Flossing - Tray (Mirrored)  - 2 x daily - 7 x weekly - 2 sets - 10 reps      ASSESSMENT:   CLINICAL IMPRESSION: Patient has improved FOTO score, but he has not yet met long-term FOTO goal. Pt has clinically significant improvement in numeric pain rating scale. Patient exhibits no change in R shoulder elevation AROM. His passive motion is grossly [facility] for R shoulder at this time. Cervical spine AROM has improved with pt demonstrating good R rotation and cervical spine flexion. Pt has improved extension and L rotation, but these are still markedly limited. Pt still has limited deltoid/RTC strength of R shoulder. Pt had MRI showing no RTC re-tear/re-injury; symptoms are likely more from cervical spine referral/radicular syndrome. Pt is awaiting follow-up with Dr. Myer Haff in August. Pt unfortunately still has notable pain with sitting and R upper limb  reaching/lifting related to neck pain and R upper quarter referred pain. 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 Patient has remaining deficits in cervical spine and R shoulder AROM, C-spine mobility, postural changes, R deltoid/RTC/periscapular strength, R upper limb paresthesias down to C6 dermatome, and L>R upper quarter pain. Patient has made good progress to date and will benefit from continued skilled therapeutic intervention to address the above deficits as needed for improved function and QoL.    REHAB POTENTIAL: Good   CLINICAL DECISION MAKING: Evolving/moderate complexity   EVALUATION COMPLEXITY: Moderate     GOALS: Goals reviewed with patient? Yes   SHORT TERM GOALS: Target date: 05/02/2023   Pt will be independent with HEP to improve strength and decrease neck pain to improve pain-free function at home and work. Baseline: 04/08/23: Baseline exercises reviewed.    05/25/23: Pt reports compliance with his HEP including exercises of RCR rehab.  Goal status: ACHIEVED      LONG TERM GOALS: Target date: 05/23/2023   Pt will increase FOTO to at least 66 to demonstrate significant improvement in function at home and work related to neck pain  Baseline: 04/08/23: 42      05/25/23: 58/60 Goal status: IN PROGRESS   2.  Pt will decrease worst neck pain by at least 2 points on the NPRS in order to demonstrate clinically significant reduction in neck pain. Baseline: 04/08/23: 8-9/10 at worst      05/25/23: 4/10 at worst Goal status: ACHIEVED    3.  Pt will demonstrates R shoulder active range of motion within 10 degrees of opposite upper limb for all planes without reproduction of pain as needed for reaching, lifting, overhead work, and self-care ADLs     Baseline: 04/08/23: limited R shoulder active flexion, ABD, IR.      05/25/23: Improved abduction and IR ROM; no significant change in flexion AROM.  Goal status: IN PROGRESS   4.  Patient will demonstrate cervical spine AROM WFL without reproduction of pain as needed for scanning environment,  overhead activity, driving Baseline: 6/94/85: C-spine AROM limited in all planes of motion     05/25/23: Lacking extension and L rotation/lateral flexion. Pt has good flexion and R rotation.  Goal status: ON-GOING     PLAN: PT FREQUENCY: 1-2x/week   PT DURATION: 4 weeks   PLANNED INTERVENTIONS: Therapeutic exercises, Therapeutic activity, Neuromuscular re-education, Balance training, Gait training, Patient/Family education, Joint manipulation, Joint mobilization, Vestibular training, Canalith repositioning, Dry Needling, Electrical stimulation, Spinal manipulation, Spinal mobilization, Cryotherapy, Moist heat, Taping, Traction, Ultrasound, Ionotophoresis 4mg /ml Dexamethasone, and Manual therapy   PLAN FOR NEXT SESSION: Utilize cervical traction, STM, and manual therapy to improve mobility, progress HEP for shoulder ROM and discuss modifications to home program prn. Assess response to repeated movement (lateral component MDT at this time) and utilize force progression prn. Progress C-spine ROM as tolerated.    Consuela Mimes, PT, DPT #[id number]  Gertie Exon, PT 05/26/2023, 8:55 AM

## 2023-05-27 ENCOUNTER — Ambulatory Visit: Payer: Medicare Other | Admitting: Physical Therapy

## 2023-05-31 NOTE — Therapy (Unsigned)
OUTPATIENT PHYSICAL THERAPY TREATMENT    Patient Name: Shawn Atkins MRN: [id number] DOB:Jun 14, 1956, 67 y.o., male Today's Date: 06/01/2023   END OF SESSION:   PT End of Session - 06/01/23 0819     Visit Number 11    Number of Visits 18    Date for PT Re-Evaluation 06/24/23    Authorization Type [employer] Medicare, VL based on medical necessity    Progress Note Due on Visit 10    PT Start Time 0817    PT Stop Time 0900    PT Time Calculation (min) 43 min    Activity Tolerance Patient tolerated treatment well    Behavior During Therapy WFL for tasks assessed/performed                Past Medical History:  Diagnosis Date   BPH (benign prostatic hypertrophy)    Bronchitis    Chronic prostatitis    Complication of anesthesia    Felt like "couldn't breathe" after triple Hernia surgery   Diabetes mellitus without complication (HCC)    Flank pain    GERD (gastroesophageal reflux disease)    h/o   Gross hematuria    History of kidney stones    HTN (hypertension)    pt states he takes lisinopril for kidney protection due to dm not htn   Inguinal hernia    Nocturia    OSA on CPAP    with 02 2 L   Overweight    PONV (postoperative nausea and vomiting)    only during kidney stone surgery   Spermatocele    Stricture of urethra    Vertigo    1 episode, 6-7 yrs ago   Past Surgical History:  Procedure Laterality Date   BICEPT TENODESIS Right 08/06/2022   Procedure: BICEPS TENODESIS;  Surgeon: Juanell Fairly, MD;  Location: [facility];  Service: Orthopedics;  Laterality: Right;   CATARACT EXTRACTION W/PHACO Left 07/22/2020   Procedure: CATARACT EXTRACTION PHACO AND INTRAOCULAR LENS PLACEMENT (IOC) LEFT DIABETIC 4.14  00:33.0;  Surgeon: Nevada Crane, MD;  Location: [facility];  Service: Ophthalmology;  Laterality: Left;  Diabetic - oral meds   CATARACT EXTRACTION W/PHACO Right 08/12/2020   Procedure: CATARACT EXTRACTION PHACO AND INTRAOCULAR LENS PLACEMENT (IOC)  RIGHT DIABETIC;  Surgeon: Nevada Crane, MD;  Location: [facility];  Service: Ophthalmology;  Laterality: Right;  1.99 0:26.2   COLONOSCOPY WITH PROPOFOL N/A 11/20/2015   Procedure: COLONOSCOPY WITH PROPOFOL;  Surgeon: Kieth Brightly, MD;  Location: [facility];  Service: Endoscopy;  Laterality: N/A;   HERNIA REPAIR Bilateral 2014   umbilical and bil inguinal/ Dr Egbert Garibaldi   KIDNEY STONE SURGERY     KNEE ARTHROSCOPY Left    open lithotomy     SHOULDER ARTHROSCOPY WITH OPEN ROTATOR CUFF REPAIR AND DISTAL CLAVICLE ACROMINECTOMY Right 08/06/2022   Procedure: SHOULDER ARTHROSCOPY WITH OPEN ROTATOR CUFF REPAIR AND DISTAL CLAVICLE ACROMINECTOMY;  Surgeon: Juanell Fairly, MD;  Location: [facility];  Service: Orthopedics;  Laterality: Right;   Patient Active Problem List   Diagnosis Date Noted   History of rectal bleeding 06/01/2018   Left groin pain 03/18/2018   Sacroiliac joint pain 01/13/2017   Abdominal pain, left lower quadrant 08/23/2015   BPH with obstruction/lower urinary tract symptoms 08/23/2015   Biceps tendinitis 06/25/2015    PCP:  Dortha Kern, MD  REFERRING PROVIDER: [name], MD   REFERRING DIAG:  [id number] (ICD-10-CM) - Cervical radiculopathy  M25.511,G89.29 (ICD-10-CM) - Chronic right shoulder  pain    THERAPY DIAG:  Cervicalgia  Right shoulder pain, unspecified chronicity  Muscle weakness (generalized)  Rationale for Evaluation and Treatment Rehabilitation  PERTINENT HISTORY: Pt is a 67 year old male s/p R shoulder rotator cuff repair and distal clavicle acromionectomy with Dr. Martha Clan on 08/06/22. Patient has hx of cervical spine pain that limited RTC repair rehab. He has new referral for cervicalgia. He reports some localized pain in top of shoulder with AROM of R arm. He has continuous soreness affecting R shoulder. Dr. Myer Haff is considering ACDF, but his A1C is not well controlled at this time. Patient reports difficulty with  turning his head to L and with flexion/extension. Pain is across upper traps and down his R upper limb with numbness/tingling affecting his R thumb. MR findings of multilevel degenerative disc disease with severe involvement at C5-C6 through C7-T1 levels. foraminal stenosis is greatest at C3-C4 and C7-T1 level with moderate involvement. Eccentric rightward disc protrusion contours the anterior surface right hemicord at C5-C6. MRI of R shoulder demonstrated no RTC re-tear. No cord compression, cord edema, or syrinx. Patient reports pain is constant. Patient reports he can intermittently get spasms all the way down R side of his back to lumbar region as well. Pt has NCV scheduled for 04/12/23. Intermittent disturbed sleep; he reports using different pillows/positions and this helps.    Pain:  Pain Intensity: Present: 5/10, Best: 2/10, Worst: 8-9/10 Pain location: Pain across upper traps, down R upper limb and to R thumb; "twisting" feeling with spasm  Pain Quality:  dull, ache   Radiating: Yes , radiating down RUE;  Numbness/Tingling: Yes, tingling into R thumb/radial styloid region; feels numb to touch along R radial styloid region  Focal Weakness: Yes, weakness with gripping, intermittently dropping items Aggravating factors: bending over, reaching, lying on R side, sitting unsupported Relieving factors: stretching can help, sitting with roller up towel behind neck 24-hour pain behavior: worse in PM  History of prior neck injury, pain, surgery, or therapy: Yes, previous PT for RCR and for neck pain prior to f/u with physician Falls: Has patient fallen in last 6 months? 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, fishing   [employer] flags (personal history of cancer, h/o spinal tumors, history of compression fracture, chills/fever, night sweats, nausea, vomiting, unrelenting pain): Negative     Precautions: None   Weight Bearing Restrictions: No   Living Environment Lives with: lives with their spouse, wife is at work throughout dya Lives in: House/apartment     Patient Goals: Get neck better, find out what's wrong    PRECAUTIONS: None      OBJECTIVE: (objective measures completed at initial evaluation unless otherwise dated)   Patient Surveys  FOTO 42, predicted improvement to 64   Cognition Patient is oriented to person, place, and time.  Recent memory is intact.  Remote memory is intact.  Attention span and concentration are intact.  Expressive speech is intact.  Patient's fund of knowledge is within normal limits for educational level.                          Gross Musculoskeletal Assessment Tremor: None Bulk: Normal Tone: Normal  Posture Forward head, mild inc thoracic kyphosis, mild Dowager's hump, rounded shoulders     AROM AROM (Normal range in degrees) AROM 04/08/2023 AROM 05/25/23  Cervical   Flexion (50) 30 45  Extension (80) 30 48  Right lateral flexion (45) 20 24  Left lateral flexion (45) 18 18*  Right rotation (85) 55 70  Left rotation (85) 40 52*  (* = pain; Blank rows = not tested)     Shoulder AROM IE Shoulder flexion: R 135*, L 163 Shoulder abduction: R 138*, L WNL ER: R 87*, L 92 IR: R 48*, L 70    05/25/23 Shoulder flexion: R 132*, L 155 Shoulder abduction: R 147*, L 170 ER: R WNL*, L 92 IR: R WFL*, L 70        MMT MMT (out of 5) Right 04/11/2023 Left 04/11/2023         Shoulder   Flexion 3+ 4+  Extension      Abduction 4- 4+  Internal rotation      Horizontal abduction      Horizontal adduction      Lower Trapezius      Rhomboids             Elbow  Flexion 4+ 5  Extension 5 5  Pronation      Supination              Wrist  Flexion 4+ 5  Extension 4 5  Radial deviation      Ulnar deviation             (* = pain; Blank rows = not tested)   Sensation Grossly intact to light touch bilateral UE as determined by testing dermatomes C2-T2. Proprioception and hot/cold testing deferred on this date.   Reflexes R/L Elbow: 2+/2+  Brachioradialis: 2+/2+  Tricep: 1+/1+ Hoffman's: negative Clonus: negative bilat      Palpation Location LEFT  RIGHT           Suboccipitals 1    Cervical paraspinals 2 1  Upper Trapezius 2 2  Levator Scapulae 1 1  Rhomboid Major/Minor 1    (Blank rows = not tested) Graded on 0-4 scale (0 = no pain, 1 = pain, 2 = pain with wincing/grimacing/flinching, 3 = pain with withdrawal, 4 = unwilling to allow palpation), (Blank rows = not tested)   Repeated Movements Repeated retraction: Increase in tension along mid-cervical spine, no worse or no exacerbation afterward     Passive Accessory Intervertebral Motion Hypomobile sideglide with R to L glide at C4-6 levels, Hypomobile generally throughout C-spine with L to R sideglide      SPECIAL TESTS Spurlings A (ipsilateral lateral flexion/axial compression): R: Positive L: Negative Distraction Test: Positive for relief  Hoffman Sign (cervical cord compression): R: Negative L: Negative ULTT Median: R: Negative L: Not examined         TODAY'S TREATMENT      SUBJECTIVE STATEMENT:  Patient reports significant pain starting this AM along L periscapular region. He reports ongoing continuous soreness affecting his R arm/shoulder and pain with active elevation above shoulder height and with lifting. He reports doing well with R C-spine rotation, but having more pain with lateral flexion/rotation to his L side. Pt is compliant with his HEP.     F/u with Dr. Myer Haff 06/17/23    Manual Therapy - for symptom modulation, soft tissue sensitivity and mobility, joint mobility, ROM   Manual cervical traction in supine;  10 sec on, 10  sec off; x 3 minutes for nerve root decompression and symptom modulation Manual traction with 30 deg cervical flexion to emphasis lower C-spine; 10 sec intervals, x 3 minutes  STM and IASTM with Hypervolt L rhomboid major/middle trapezius and lower trapezius; x 10 minutes TPR L rhomboid maj; x 3 minutes  MET for improved L C-spine rotation; x10 with 5-sec antagonist contraction     *not today* Cervical spine sideglides, emphasis on L to R; 2 x 30 sec at C4-6 levels Manual traction with L rotation up to pain tolerance; x 10 oscillations with maintaining traction TPR bilat upper trap; x 3 minutes Passive cervical sidebend stretch within pt tolerance with contralateral scapular depression; x 30 sec on each side Trigger Point Dry Needling (TDN), unbilled Education performed with patient regarding potential benefit of TDN. Reviewed precautions and risks with patient. Extensive time spent with pt to ensure full understanding of TDN risks. Pt provided verbal consent to treatment. TDN performed to L upper trapezius and L splenius cervicis along C5-6 levels with 0.25 x 40 single needle placements with local twitch response (LTR). Pistoning technique utilized. Acute discomfort with C6 cervical paraspinal needle insertion with no remaining complaints following needle removal. Modestly improved cervical spine rotation to R/L.      MHP (unbilled) utilized following manual therapy for analgesic effect and improved soft tissue extensibility - along L periscapular mm with pt lying in prone, x 5 minutes    Therapeutic Exercise - for improved soft tissue flexibility and extensibility as needed for ROM, repeated movements    AROM Cervical flexion:  WNL Cervical extension: Mod motion loss Lateral flexion: Right 50% , Left 50% Cervical rotation: Right 70, Left 65 *Indicates pain    Scapular circles; x20 fwd and bwd  Standing Tband mid-row; Black Tband; 2x10  -verbal and tactile  cueing for scapular depression versus elevation    PATIENT EDUCATION: Encouraged continued HEP including repeated movement program and neurodynamics.    *next visit* Upper body ergometer, 2 minutes forward, 2 minutes backward - for tissue warm-up to improve muscle performance, improved soft tissue mobility/extensibility  -1 minute unbilled time -subjective information gathered during portion of this time   *not today* Median nerve floss; RUE with elbow/upper arm supported on armrest with pillow; x20 Cervical retraction with R lateral flexion, repeated; seated; 2x10  -no effect during, no significant R arm pain reported after  -baseline symptoms (numbness into R thumb/radial styloid and pain along R deltoid remain) Cervical self-SNAG with towel; for L rotation; 1 x 10 Supine cervical sidebend stretch; x 30 sec ea dir  -pinch with closing on L side Scapular retraction; 2x10, 5 sec      PATIENT EDUCATION:  Education details: see above for patient education details Person educated: Patient Education method: Explanation Education comprehension: verbalized understanding     HOME EXERCISE PROGRAM: Access Code: [email or web address] URL: [email or web address] Date: 05/11/2023 Prepared by: Consuela Mimes  Exercises - Seated Neck Sidebending ROM  - 6 x daily - 7 x weekly - 1 sets - 10 reps - 1-2sec hold - Seated Scapular Retraction  - 2 x daily - 7 x weekly - 2 sets - 10 reps - 5sec hold - Median Nerve Flossing - Tray (Mirrored)  - 2 x daily - 7 x weekly - 2 sets - 10 reps      ASSESSMENT:   CLINICAL IMPRESSION: Cervical traction and cerv traction with slight flexion to modestly mitigate periscapular symptoms. Pt has remaining sensitivity affecting L middle trapezius/rhomboid mm with  lesser sensitivity along lower trapezius. We utilized IASTM and TPR today for this region and moist heat following manual therapy. Pt feels that L periscapular symptoms remain, but they are  lesser following treatment. We continued with periscapular isotonics with no significant pain reported. Pt is able to rotate C-spine to functional ROM both to L and R at this time. He does have ongoing worse pain in evening and disturbed sleep usually once per night due to neck discomfort. Patient has remaining deficits in cervical spine and R shoulder AROM, C-spine mobility, postural changes, R deltoid/RTC/periscapular strength, R upper limb paresthesias down to C6 dermatome, and L>R upper quarter pain. Patient has made good progress to date and will benefit from continued skilled therapeutic intervention to address the above deficits as needed for improved function and QoL.    REHAB POTENTIAL: Good   CLINICAL DECISION MAKING: Evolving/moderate complexity   EVALUATION COMPLEXITY: Moderate     GOALS: Goals reviewed with patient? Yes   SHORT TERM GOALS: Target date: 05/02/2023   Pt will be independent with HEP to improve strength and decrease neck pain to improve pain-free function at home and work. Baseline: 04/08/23: Baseline exercises reviewed.    05/25/23: Pt reports compliance with his HEP including exercises of RCR rehab.  Goal status: ACHIEVED      LONG TERM GOALS: Target date: 05/23/2023   Pt will increase FOTO to at least 66 to demonstrate significant improvement in function at home and work related to neck pain  Baseline: 04/08/23: 42      05/25/23: 58/60 Goal status: IN PROGRESS   2.  Pt will decrease worst neck pain by at least 2 points on the NPRS in order to demonstrate clinically significant reduction in neck pain. Baseline: 04/08/23: 8-9/10 at worst      05/25/23: 4/10 at worst Goal status: ACHIEVED    3.  Pt will demonstrates R shoulder active range of motion within 10 degrees of opposite upper limb for all planes without reproduction of pain as needed for reaching, lifting, overhead work, and self-care ADLs     Baseline: 04/08/23: limited R shoulder active flexion, ABD, IR.       05/25/23: Improved abduction and IR ROM; no significant change in flexion AROM.  Goal status: IN PROGRESS   4.  Patient will demonstrate cervical spine AROM WFL without reproduction of pain as needed for scanning environment, overhead activity, driving Baseline: 1/61/09: C-spine AROM limited in all planes of motion     05/25/23: Lacking extension and L rotation/lateral flexion. Pt has good flexion and R rotation.  Goal status: ON-GOING     PLAN: PT FREQUENCY: 1-2x/week   PT DURATION: 4 weeks   PLANNED INTERVENTIONS: Therapeutic exercises, Therapeutic activity, Neuromuscular re-education, Balance training, Gait training, Patient/Family education, Joint manipulation, Joint mobilization, Vestibular training, Canalith repositioning, Dry Needling, Electrical stimulation, Spinal manipulation, Spinal mobilization, Cryotherapy, Moist heat, Taping, Traction, Ultrasound, Ionotophoresis 4mg /ml Dexamethasone, and Manual therapy   PLAN FOR NEXT SESSION: Utilize cervical traction, STM, and manual therapy to improve mobility, progress HEP for shoulder ROM and discuss modifications to home program prn. Assess response to repeated movement (lateral component MDT at this time) and utilize force progression prn. Progress C-spine ROM as tolerated.    Consuela Mimes, PT, DPT #[id number]  Gertie Exon, PT 06/01/2023, 9:20 AM

## 2023-06-01 ENCOUNTER — Ambulatory Visit: Payer: Medicare Other | Attending: Neurosurgery | Admitting: Physical Therapy

## 2023-06-01 DIAGNOSIS — M6281 Muscle weakness (generalized): Secondary | ICD-10-CM | POA: Insufficient documentation

## 2023-06-01 DIAGNOSIS — M25511 Pain in right shoulder: Secondary | ICD-10-CM | POA: Diagnosis present

## 2023-06-01 DIAGNOSIS — M542 Cervicalgia: Secondary | ICD-10-CM | POA: Insufficient documentation

## 2023-06-01 NOTE — Telephone Encounter (Signed)
04/12/2023 done

## 2023-06-02 ENCOUNTER — Ambulatory Visit
Admission: EM | Admit: 2023-06-02 | Discharge: 2023-06-02 | Disposition: A | Discharge status: Home / Self Care | Payer: Medicare Other | Attending: Family | Admitting: Family

## 2023-06-02 ENCOUNTER — Ambulatory Visit (INDEPENDENT_AMBULATORY_CARE_PROVIDER_SITE_OTHER): Payer: Medicare Other

## 2023-06-02 DIAGNOSIS — R0782 Intercostal pain: Secondary | ICD-10-CM | POA: Diagnosis not present

## 2023-06-02 DIAGNOSIS — R0781 Pleurodynia: Secondary | ICD-10-CM | POA: Diagnosis not present

## 2023-06-02 DIAGNOSIS — R0789 Other chest pain: Secondary | ICD-10-CM

## 2023-06-02 NOTE — ED Provider Notes (Signed)
[facility]    CSN: [id number] Arrival date & time: 06/02/23  0804      History   Chief Complaint Chief Complaint  Patient presents with   Rib Pain    HPI Shawn Atkins is a 67 y.o. male.   67 year old male presents with right lower rib pain that started yesterday after his PT session for his back. Was at physical therapy yesterday lying on his stomach when the physical therapist was working on his lower back. He moved slightly and felt a "pop" in his right lower rib. The pain has continued and increases in certain positions (mainly sitting) and with any coughing or deep breaths. He has fractured his left ribs in the past and symptoms feel similar. Former smoker. Denies any fever, shortness of breath, nausea or vomiting. He has tried OTC Tylenol and Lidocaine pain patch with minimal relief. Other chronic health issues include type 2 DM, hyperlipidemia, BPH and low testosterone. Currently on Metformin, Ozempic, Actos, Lipitor daily. Takes Lisinopril for kidney protection due to DM- not HTN. Testosterone injections bimonthly and Albuterol nebulizer as needed.   The history is provided by the patient.    Past Medical History:  Diagnosis Date   BPH (benign prostatic hypertrophy)    Bronchitis    Chronic prostatitis    Complication of anesthesia    Felt like "couldn't breathe" after triple Hernia surgery   Diabetes mellitus without complication (HCC)    Flank pain    GERD (gastroesophageal reflux disease)    h/o   Gross hematuria    History of kidney stones    HTN (hypertension)    pt states he takes lisinopril for kidney protection due to dm not htn   Inguinal hernia    Nocturia    OSA on CPAP    with 02 2 L   Overweight    PONV (postoperative nausea and vomiting)    only during kidney stone surgery   Spermatocele    Stricture of urethra    Vertigo    1 episode, 6-7 yrs ago    Patient Active Problem List   Diagnosis Date Noted   Postoperative pain  08/09/2022   History of rectal bleeding 06/01/2018   Left groin pain 03/18/2018   Sacroiliac joint pain 01/13/2017   Abdominal pain, left lower quadrant 08/23/2015   BPH with obstruction/lower urinary tract symptoms 08/23/2015   Biceps tendinitis 06/25/2015    Past Surgical History:  Procedure Laterality Date   BICEPT TENODESIS Right 08/06/2022   Procedure: BICEPS TENODESIS;  Surgeon: Juanell Fairly, MD;  Location: [facility];  Service: Orthopedics;  Laterality: Right;   CATARACT EXTRACTION W/PHACO Left 07/22/2020   Procedure: CATARACT EXTRACTION PHACO AND INTRAOCULAR LENS PLACEMENT (IOC) LEFT DIABETIC 4.14  00:33.0;  Surgeon: Nevada Crane, MD;  Location: [facility];  Service: Ophthalmology;  Laterality: Left;  Diabetic - oral meds   CATARACT EXTRACTION W/PHACO Right 08/12/2020   Procedure: CATARACT EXTRACTION PHACO AND INTRAOCULAR LENS PLACEMENT (IOC) RIGHT DIABETIC;  Surgeon: Nevada Crane, MD;  Location: [facility];  Service: Ophthalmology;  Laterality: Right;  1.99 0:26.2   COLONOSCOPY WITH PROPOFOL N/A 11/20/2015   Procedure: COLONOSCOPY WITH PROPOFOL;  Surgeon: Kieth Brightly, MD;  Location: [facility];  Service: Endoscopy;  Laterality: N/A;   HERNIA REPAIR Bilateral 2014   umbilical and bil inguinal/ Dr Egbert Garibaldi   KIDNEY STONE SURGERY     KNEE ARTHROSCOPY Left    open lithotomy  SHOULDER ARTHROSCOPY WITH OPEN ROTATOR CUFF REPAIR AND DISTAL CLAVICLE ACROMINECTOMY Right 08/06/2022   Procedure: SHOULDER ARTHROSCOPY WITH OPEN ROTATOR CUFF REPAIR AND DISTAL CLAVICLE ACROMINECTOMY;  Surgeon: Juanell Fairly, MD;  Location: [facility];  Service: Orthopedics;  Laterality: Right;       Home Medications    Prior to Admission medications   Medication Sig Start Date End Date Taking? Authorizing Provider  albuterol (PROVENTIL) (2.5 MG/3ML) 0.083% nebulizer solution Take 2.5 mg by nebulization every 6 (six) hours as needed for wheezing or shortness  of breath (Last used in the winter of 2022).   Yes [provider]  atorvastatin (LIPITOR) 20 MG tablet Take 20 mg by mouth every evening.   Yes [provider]  BIOTIN PO Take 1 tablet by mouth daily.   Yes [provider]  Blood Glucose Monitoring Suppl (ONE TOUCH ULTRA 2) w/Device KIT SMARTSIG:Via Meter 05/11/23  Yes [provider]  glucosamine-chondroitin 500-400 MG tablet Take 1 tablet by mouth 2 (two) times daily.   Yes [provider]  Lancets (ONETOUCH DELICA PLUS LANCET33G) MISC Apply 1 each topically daily. 05/11/23  Yes [provider]  lisinopril (ZESTRIL) 10 MG tablet Take 10 mg by mouth every morning. 10/01/20  Yes [provider]  metFORMIN (GLUCOPHAGE-XR) 500 MG 24 hr tablet Take 1,000 mg by mouth 2 (two) times daily with a meal. 11/26/21  Yes [provider]  Multiple Vitamin (MULTIVITAMIN) tablet Take 1 tablet by mouth daily.   Yes [provider]  NEEDLE, DISP, 18 G (BD DISP NEEDLES) 18G X 1-1/2" MISC 1 mg by Does not apply route every 14 (fourteen) days. 08/19/22  Yes McGowan, Carollee Herter A, PA-C  NEEDLE, DISP, 21 G (BD DISP NEEDLES) 21G X 1-1/2" MISC 1 mg by Does not apply route every 14 (fourteen) days. 08/19/22  Yes McGowan, Carollee Herter A, PA-C  ONETOUCH ULTRA TEST test strip USE 1 STRIP TO CHECK GLUCOSE ONCE DAILY 05/11/23  Yes [provider]  OZEMPIC, 0.25 OR 0.5 MG/DOSE, 2 MG/3ML SOPN Inject 0.5 mg into the skin once a week.   Yes [provider]  pioglitazone (ACTOS) 15 MG tablet Take 15 mg by mouth daily. 11/11/22  Yes [provider]  Syringe, Disposable, (2-3CC SYRINGE) 3 ML MISC 1 mg by Does not apply route every 14 (fourteen) days. 07/27/22  Yes McGowan, Carollee Herter A, PA-C  tadalafil (CIALIS) 5 MG tablet Take 1 tablet (5 mg total) by mouth every morning. BPH 11/23/22  Yes McGowan, Carollee Herter A, PA-C  testosterone cypionate (DEPOTESTOSTERONE CYPIONATE) 200 MG/ML injection Inject 1 mL  (200 mg total) into the muscle every 14 (fourteen) days. INJECT 1 ML INTO MUSCLE EVERY 14 DAYS 05/24/23  Yes McGowan, Wellington Hampshire, PA-C    Family History Family History  Problem Relation Age of Onset   Benign prostatic hyperplasia Father    Kidney disease Neg Hx    Prostate cancer Neg Hx     Social History Social History   Tobacco Use   Smoking status: Former    Current packs/day: 0.00    Average packs/day: 1 pack/day for 15.0 years (15.0 ttl pk-yrs)    Types: Cigarettes    Start date: 66    Quit date: 95    Years since quitting: 39.6    Passive exposure: Past   Smokeless tobacco: Current    Types: Snuff   Tobacco comments:    Smokless and snuff  Vaping Use   Vaping status: Never Used  Substance Use  Topics   Alcohol use: Yes    Alcohol/week: 1.0 standard drink of alcohol    Types: 1 Cans of beer per week    Comment: 3 beers daily   Drug use: No     Allergies   Penicillins and Morphine and codeine   Review of Systems Review of Systems  Constitutional:  Positive for activity change. Negative for appetite change, chills, fatigue and fever.  Respiratory:  Negative for cough, chest tightness, shortness of breath and wheezing.   Cardiovascular:  Positive for chest pain (right lower rib pain below breast). Negative for palpitations.  Gastrointestinal:  Negative for nausea and vomiting.  Musculoskeletal:  Positive for back pain (chronic). Negative for gait problem, neck pain and neck stiffness.  Skin:  Negative for color change, rash and wound.  Allergic/Immunologic: Negative for environmental allergies and food allergies.  Neurological:  Negative for dizziness, tremors, seizures, syncope, speech difficulty, light-headedness, numbness and headaches.  Hematological:  Negative for adenopathy. Does not bruise/bleed easily.     Physical Exam Triage Vital Signs ED Triage Vitals  Encounter Vitals Group     BP 06/02/23 0820 129/79     Systolic BP Percentile --       Diastolic BP Percentile --      Pulse Rate 06/02/23 0820 83     Resp 06/02/23 0820 16     Temp 06/02/23 0820 98.7 F (37.1 C)     Temp Source 06/02/23 0820 Oral     SpO2 06/02/23 0820 94 %     Weight 06/02/23 0820 205 lb (93 kg)     Height 06/02/23 0820 5\' 9"  (1.753 m)     Head Circumference --      Peak Flow --      Pain Score 06/02/23 0822 6     Pain Loc --      Pain Education --      Exclude from Growth Chart --    No data found.  Updated Vital Signs BP 129/79 (BP Location: Right Arm)   Pulse 83   Temp 98.7 F (37.1 C) (Oral)   Resp 16   Ht 5\' 9"  (1.753 m)   Wt 205 lb (93 kg)   SpO2 94%   BMI 30.27 kg/m   Visual Acuity Right Eye Distance:   Left Eye Distance:   Bilateral Distance:    Right Eye Near:   Left Eye Near:    Bilateral Near:     Physical Exam Vitals and nursing note reviewed.  Constitutional:      General: He is awake. He is not in acute distress.    Appearance: He is well-developed and well-groomed.     Comments: He is standing in the exam room since sitting on the exam table increases his discomfort. He is resting his right hand on his right chest below his breast. No difficulty breathing but pain with inspiration and talking in complete sentences.   HENT:     Head: Normocephalic and atraumatic.     Right Ear: Hearing normal.     Left Ear: Hearing normal.  Cardiovascular:     Rate and Rhythm: Normal rate and regular rhythm.     Heart sounds: Normal heart sounds. No murmur heard. Pulmonary:     Effort: Pulmonary effort is normal. No tachypnea, accessory muscle usage, respiratory distress or retractions.     Breath sounds: Normal breath sounds and air entry. No decreased air movement. No decreased breath sounds, wheezing, rhonchi or rales.  Chest:  Chest wall: Tenderness present. No mass, deformity, swelling or crepitus.       Comments: Pain with inspiration and tenderness along right 8th/9th rib area. No swelling, bruising or deformity  present.  Musculoskeletal:        General: Tenderness present. Normal range of motion.     Cervical back: Normal range of motion.  Skin:    General: Skin is warm and dry.     Capillary Refill: Capillary refill takes less than 2 seconds.     Findings: No abrasion, bruising, ecchymosis, erythema, laceration, rash or wound.  Neurological:     General: No focal deficit present.     Mental Status: He is alert and oriented to person, place, and time.  Psychiatric:        Mood and Affect: Mood normal.        Behavior: Behavior normal. Behavior is cooperative.        Thought Content: Thought content normal.        Judgment: Judgment normal.      UC Treatments / Results  Labs (all labs ordered are listed, but only abnormal results are displayed) Labs Reviewed - No data to display  EKG   Radiology DG Ribs Unilateral W/Chest Right  Result Date: 06/02/2023 CLINICAL DATA:  Right lower rib pain following physical therapy yesterday. EXAM: RIGHT RIBS AND CHEST - 3+ VIEW COMPARISON:  Two-view chest x-ray 11/22/2018 FINDINGS: The heart size is normal. The lungs are clear. Chronic elevation of the right hemidiaphragm is noted. No edema or effusion is present. Dedicated images the ribs demonstrates no acute or healing fractures. No pneumothorax is present. IMPRESSION: 1. No acute cardiopulmonary disease. 2. Chronic elevation of the right hemidiaphragm. Electronically Signed   By: Marin Roberts M.D.   On: 06/02/2023 09:46    Procedures Procedures (including critical care time)  Medications Ordered in UC Medications - No data to display  Initial Impression / Assessment and Plan / UC Course  I have reviewed the triage vital signs and the nursing notes.  Pertinent labs & imaging results that were available during my care of the patient were reviewed by me and considered in my medical decision making (see chart for details).     Reviewed chest x-ray results with patient. No acute rib  fractures seen. Lungs are clear. No pneumothorax. Chronic elevation of right hemidiaphragm present and stable as compared to chest x-ray in 2020. Discussed that he probably strained/pulled a ligament along his rib cage. Continue to rest. May take OTC Ibuprofen 600mg  every 6 to 8 hours as needed for pain. May apply warm compresses to area for comfort and alternate with cool compresses as needed. Follow-up in 4 to 5 days with his PCP if not improving or sooner if worsening.  Final Clinical Impressions(s) / UC Diagnoses   Final diagnoses:  Rib pain on right side  Right-sided chest wall pain     Discharge Instructions      Your x-ray did not show any rib fracture- you probably strained a ligament along your rib cage. Rest. May take OTC Ibuprofen 600mg  every 6 to 8 hours as needed for pain. May also apply warm compresses to area and alternate with cool for comfort. Follow-up in 4 to 5 days with your PCP if not improving or sooner if worsening.      ED Prescriptions   None    PDMP not reviewed this encounter.   Sudie Grumbling, NP 06/03/23 1040

## 2023-06-02 NOTE — Discharge Instructions (Signed)
Your x-ray did not show any rib fracture- you probably strained a ligament along your rib cage. Rest. May take OTC Ibuprofen 600mg  every 6 to 8 hours as needed for pain. May also apply warm compresses to area and alternate with cool for comfort. Follow-up in 4 to 5 days with your PCP if not improving or sooner if worsening.

## 2023-06-02 NOTE — ED Triage Notes (Addendum)
Pt c/o R sided rib pain x1 day. States he was at [employer] faced down & heard a popping sensation.

## 2023-06-08 ENCOUNTER — Encounter: Payer: Self-pay | Admitting: Physical Therapy

## 2023-06-08 ENCOUNTER — Ambulatory Visit: Payer: Medicare Other | Admitting: Physical Therapy

## 2023-06-08 DIAGNOSIS — M6281 Muscle weakness (generalized): Secondary | ICD-10-CM

## 2023-06-08 DIAGNOSIS — M542 Cervicalgia: Secondary | ICD-10-CM | POA: Diagnosis not present

## 2023-06-08 DIAGNOSIS — M25511 Pain in right shoulder: Secondary | ICD-10-CM

## 2023-06-08 NOTE — Therapy (Unsigned)
OUTPATIENT PHYSICAL THERAPY TREATMENT    Patient Name: Shawn Atkins MRN: [id number] DOB:[birth year], 67 y.o., male Today's Date: 06/08/2023   END OF SESSION:   PT End of Session - 06/08/23 0817     Visit Number 12    Number of Visits 18    Date for PT Re-Evaluation 06/24/23    Authorization Type [employer] Medicare, VL based on medical necessity    Progress Note Due on Visit 10    PT Start Time 0815    PT Stop Time 0857    PT Time Calculation (min) 42 min    Activity Tolerance Patient tolerated treatment well    Behavior During Therapy WFL for tasks assessed/performed             Past Medical History:  Diagnosis Date   BPH (benign prostatic hypertrophy)    Bronchitis    Chronic prostatitis    Complication of anesthesia    Felt like "couldn't breathe" after triple Hernia surgery   Diabetes mellitus without complication (HCC)    Flank pain    GERD (gastroesophageal reflux disease)    h/o   Gross hematuria    History of kidney stones    HTN (hypertension)    pt states he takes lisinopril for kidney protection due to dm not htn   Inguinal hernia    Nocturia    OSA on CPAP    with 02 2 L   Overweight    PONV (postoperative nausea and vomiting)    only during kidney stone surgery   Spermatocele    Stricture of urethra    Vertigo    1 episode, 6-7 yrs ago   Past Surgical History:  Procedure Laterality Date   BICEPT TENODESIS Right 08/06/2022   Procedure: BICEPS TENODESIS;  Surgeon: Juanell Fairly, MD;  Location: [facility];  Service: Orthopedics;  Laterality: Right;   CATARACT EXTRACTION W/PHACO Left 07/22/2020   Procedure: CATARACT EXTRACTION PHACO AND INTRAOCULAR LENS PLACEMENT (IOC) LEFT DIABETIC 4.14  00:33.0;  Surgeon: Nevada Crane, MD;  Location: [facility];  Service: Ophthalmology;  Laterality: Left;  Diabetic - oral meds   CATARACT EXTRACTION W/PHACO Right 08/12/2020   Procedure: CATARACT EXTRACTION PHACO AND INTRAOCULAR LENS PLACEMENT (IOC)  RIGHT DIABETIC;  Surgeon: Nevada Crane, MD;  Location: [facility];  Service: Ophthalmology;  Laterality: Right;  1.99 0:26.2   COLONOSCOPY WITH PROPOFOL N/A 11/20/2015   Procedure: COLONOSCOPY WITH PROPOFOL;  Surgeon: Kieth Brightly, MD;  Location: [facility];  Service: Endoscopy;  Laterality: N/A;   HERNIA REPAIR Bilateral 2014   umbilical and bil inguinal/ Dr Egbert Garibaldi   KIDNEY STONE SURGERY     KNEE ARTHROSCOPY Left    open lithotomy     SHOULDER ARTHROSCOPY WITH OPEN ROTATOR CUFF REPAIR AND DISTAL CLAVICLE ACROMINECTOMY Right 08/06/2022   Procedure: SHOULDER ARTHROSCOPY WITH OPEN ROTATOR CUFF REPAIR AND DISTAL CLAVICLE ACROMINECTOMY;  Surgeon: Juanell Fairly, MD;  Location: [facility];  Service: Orthopedics;  Laterality: Right;   Patient Active Problem List   Diagnosis Date Noted   Postoperative pain 08/09/2022   History of rectal bleeding 06/01/2018   Left groin pain 03/18/2018   Sacroiliac joint pain 01/13/2017   Abdominal pain, left lower quadrant 08/23/2015   BPH with obstruction/lower urinary tract symptoms 08/23/2015   Biceps tendinitis 06/25/2015    PCP:  Dortha Kern, MD  REFERRING PROVIDER: [name], MD   REFERRING DIAG:  [id number] (ICD-10-CM) - Cervical radiculopathy  M25.511,G89.29 (ICD-10-CM) - Chronic  right shoulder pain    THERAPY DIAG:  Cervicalgia  Right shoulder pain, unspecified chronicity  Muscle weakness (generalized)  Rationale for Evaluation and Treatment Rehabilitation  PERTINENT HISTORY: Pt is a 67 year old male s/p R shoulder rotator cuff repair and distal clavicle acromionectomy with Dr. Martha Clan on 08/06/22. Patient has hx of cervical spine pain that limited RTC repair rehab. He has new referral for cervicalgia. He reports some localized pain in top of shoulder with AROM of R arm. He has continuous soreness affecting R shoulder. Dr. Myer Haff is considering ACDF, but his A1C is not well controlled at this time.  Patient reports difficulty with turning his head to L and with flexion/extension. Pain is across upper traps and down his R upper limb with numbness/tingling affecting his R thumb. MR findings of multilevel degenerative disc disease with severe involvement at C5-C6 through C7-T1 levels. foraminal stenosis is greatest at C3-C4 and C7-T1 level with moderate involvement. Eccentric rightward disc protrusion contours the anterior surface right hemicord at C5-C6. MRI of R shoulder demonstrated no RTC re-tear. No cord compression, cord edema, or syrinx. Patient reports pain is constant. Patient reports he can intermittently get spasms all the way down R side of his back to lumbar region as well. Pt has NCV scheduled for 04/12/23. Intermittent disturbed sleep; he reports using different pillows/positions and this helps.    Pain:  Pain Intensity: Present: 5/10, Best: 2/10, Worst: 8-9/10 Pain location: Pain across upper traps, down R upper limb and to R thumb; "twisting" feeling with spasm  Pain Quality:  dull, ache   Radiating: Yes , radiating down RUE;  Numbness/Tingling: Yes, tingling into R thumb/radial styloid region; feels numb to touch along R radial styloid region  Focal Weakness: Yes, weakness with gripping, intermittently dropping items Aggravating factors: bending over, reaching, lying on R side, sitting unsupported Relieving factors: stretching can help, sitting with roller up towel behind neck 24-hour pain behavior: worse in PM  History of prior neck injury, pain, surgery, or therapy: Yes, previous PT for RCR and for neck pain prior to f/u with physician Falls: Has patient fallen in last 6 months? 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, fishing   [employer] flags (personal history of cancer, h/o spinal tumors, history of compression fracture, chills/fever, night sweats, nausea, vomiting, unrelenting pain): Negative     Precautions: None   Weight Bearing Restrictions: No   Living Environment Lives with: lives with their spouse, wife is at work throughout dya Lives in: House/apartment     Patient Goals: Get neck better, find out what's wrong    PRECAUTIONS: None      OBJECTIVE: (objective measures completed at initial evaluation unless otherwise dated)   Patient Surveys  FOTO 42, predicted improvement to 60   Cognition Patient is oriented to person, place, and time.  Recent memory is intact.  Remote memory is intact.  Attention span and concentration are intact.  Expressive speech is intact.  Patient's fund of knowledge is within normal limits for educational level.                          Gross Musculoskeletal Assessment Tremor: None Bulk: Normal Tone: Normal  Posture Forward head, mild inc thoracic kyphosis, mild Dowager's hump, rounded shoulders     AROM AROM (Normal range in degrees) AROM 04/08/2023 AROM 05/25/23  Cervical   Flexion (50) 30 45  Extension (80) 30 48  Right lateral flexion (45) 20 24  Left lateral flexion (45) 18 18*  Right rotation (85) 55 70  Left rotation (85) 40 52*  (* = pain; Blank rows = not tested)     Shoulder AROM IE Shoulder flexion: R 135*, L 163 Shoulder abduction: R 138*, L WNL ER: R 87*, L 92 IR: R 48*, L 70    05/25/23 Shoulder flexion: R 132*, L 155 Shoulder abduction: R 147*, L 170 ER: R WNL*, L 92 IR: R WFL*, L 70        MMT MMT (out of 5) Right 04/11/2023 Left 04/11/2023         Shoulder   Flexion 3+ 4+  Extension      Abduction 4- 4+  Internal rotation      Horizontal abduction      Horizontal adduction      Lower Trapezius      Rhomboids             Elbow  Flexion 4+ 5  Extension 5 5   Pronation      Supination             Wrist  Flexion 4+ 5  Extension 4 5  Radial deviation      Ulnar deviation             (* = pain; Blank rows = not tested)   Sensation Grossly intact to light touch bilateral UE as determined by testing dermatomes C2-T2. Proprioception and hot/cold testing deferred on this date.   Reflexes R/L Elbow: 2+/2+  Brachioradialis: 2+/2+  Tricep: 1+/1+ Hoffman's: negative Clonus: negative bilat      Palpation Location LEFT  RIGHT           Suboccipitals 1    Cervical paraspinals 2 1  Upper Trapezius 2 2  Levator Scapulae 1 1  Rhomboid Major/Minor 1    (Blank rows = not tested) Graded on 0-4 scale (0 = no pain, 1 = pain, 2 = pain with wincing/grimacing/flinching, 3 = pain with withdrawal, 4 = unwilling to allow palpation), (Blank rows = not tested)   Repeated Movements Repeated retraction: Increase in tension along mid-cervical spine, no worse or no exacerbation afterward     Passive Accessory Intervertebral Motion Hypomobile sideglide with R to L glide at C4-6 levels, Hypomobile generally throughout C-spine with L to R sideglide      SPECIAL TESTS Spurlings A (ipsilateral lateral flexion/axial compression): R: Positive L: Negative Distraction Test: Positive for relief  Hoffman Sign (cervical cord compression): R: Negative L: Negative ULTT Median: R: Negative L: Not examined         TODAY'S TREATMENT      SUBJECTIVE STATEMENT:  Patient had notable R rib cage pain along anterior R rib angle following changing into prone position in clinic this past week. Pt had pop and pain affecting R rib cage at the time. Pt reports that this worsened since then. He had urgent care visit on 06/02/23 to be seen for rib cage pain with pt concerned for fracture (Hx of rib fractures years ago) and had negative X-rays at the time. Provider believed he had sprain versus fracture or other serious injury.    F/u with Dr. Myer Haff  06/17/23  Manual Therapy - for symptom modulation, soft tissue sensitivity and mobility, joint mobility, ROM   KT tape along R ribs along 7-9 levels, 4 strips arranged in crossing parallel lines (2 parallel to rib, and 2 vertical) STM along L>R C3-6 splenius cervicis/capitis x 8 minutes Cervical spine sideglides, emphasis on L to R; 3 x 30 sec at C4-6 levels    *not today* Manual cervical traction in supine; 10 sec on, 10 sec off; x 3 minutes for nerve root decompression and symptom modulation Manual traction with 30 deg cervical flexion to emphasis lower C-spine; 10 sec intervals, x 3 minutes STM and IASTM with Hypervolt L rhomboid major/middle trapezius and lower trapezius; x 10 minutes TPR L rhomboid maj; x 3 minutes MET for improved L C-spine rotation; x10 with 5-sec antagonist contraction Manual traction with L rotation up to pain tolerance; x 10 oscillations with maintaining traction TPR bilat upper trap; x 3 minutes Passive cervical sidebend stretch within pt tolerance with contralateral scapular depression; x 30 sec on each side Trigger Point Dry Needling (TDN), unbilled Education performed with patient regarding potential benefit of TDN. Reviewed precautions and risks with patient. Extensive time spent with pt to ensure full understanding of TDN risks. Pt provided verbal consent to treatment. TDN performed to L upper trapezius and L splenius cervicis along C5-6 levels with 0.25 x 40 single needle placements with local twitch response (LTR). Pistoning technique utilized. Acute discomfort with C6 cervical paraspinal needle insertion with no remaining complaints following needle removal. Modestly improved cervical spine rotation to R/L.       Therapeutic Exercise - for improved soft tissue flexibility and extensibility as needed for ROM, repeated movements    AROM Cervical flexion:  WNL Cervical extension: Mod motion loss Lateral flexion: Right 50% , Left 50% Cervical  rotation: Right 70, Left 65 *Indicates pain   Repeated sideglide R (toward side of upper limb referred symptoms); reviewed  PATIENT EDUCATION: Discussed use of ice along R rib cage and activity modification to limit flare-up of condition. Pt to continue use of NSAIDs as instructed by provider at urgent care clinic.     *not today* Scapular circles; x20 fwd and bwd Standing Tband mid-row; Black Tband; 2x10  -verbal and tactile cueing for scapular depression versus elevation  Upper body ergometer, 2 minutes forward, 2 minutes backward - for tissue warm-up to improve muscle performance, improved soft tissue mobility/extensibility  -subjective information gathered during portion of this time MHP (unbilled) utilized following manual therapy for analgesic effect and improved soft tissue extensibility - along L periscapular mm with pt lying in prone, x 5 minutes Median nerve floss; RUE with elbow/upper arm supported on armrest with pillow; x20 Cervical retraction with R lateral flexion, repeated; seated; 2x10  -no effect during, no significant R arm pain reported after  -baseline symptoms (numbness into R thumb/radial styloid and pain along R deltoid remain) Cervical self-SNAG with towel; for L rotation; 1 x 10 Supine cervical sidebend stretch; x 30 sec ea dir  -pinch with closing on L side Scapular retraction; 2x10, 5 sec      PATIENT EDUCATION:  Education details: see above for patient education details Person educated: Patient Education method: Explanation Education comprehension: verbalized understanding     HOME EXERCISE PROGRAM: Access Code: [email or web address] URL: [email or web address] Date: 05/11/2023 Prepared by: Consuela Mimes  Exercises - Seated Neck Sidebending ROM  - 6 x daily - 7 x weekly - 1 sets - 10 reps - 1-2sec hold - Seated Scapular Retraction  -  2 x daily - 7 x weekly - 2 sets - 10 reps - 5sec hold - Median Nerve Flossing - Tray (Mirrored)  - 2 x  daily - 7 x weekly - 2 sets - 10 reps      ASSESSMENT:   CLINICAL IMPRESSION: Patient has minimal neck pain at this time; pt is largely focused on pain in R rib cage which began following position change on treatment table last week from supine to prone lying position with pt feeling pop and onset of sharp pain along R rib cage along anterior portion of rib angle near levels 7-9. We applied K-Tape to the area and discussed technique he could use at home for taping ribs if symptoms respond favorably. We discussed safety with resumption of repeated movement program with neck and use of ice and activity modification to allow for rib cage symptoms to improve over time.  Patient has remaining deficits in cervical spine and R shoulder AROM, C-spine mobility, postural changes, R deltoid/RTC/periscapular strength, R upper limb paresthesias down to C6 dermatome, and L>R upper quarter pain. Patient has made good progress to date and will benefit from continued skilled therapeutic intervention to address the above deficits as needed for improved function and QoL.    REHAB POTENTIAL: Good   CLINICAL DECISION MAKING: Evolving/moderate complexity   EVALUATION COMPLEXITY: Moderate     GOALS: Goals reviewed with patient? Yes   SHORT TERM GOALS: Target date: 05/02/2023   Pt will be independent with HEP to improve strength and decrease neck pain to improve pain-free function at home and work. Baseline: 04/08/23: Baseline exercises reviewed.    05/25/23: Pt reports compliance with his HEP including exercises of RCR rehab.  Goal status: ACHIEVED      LONG TERM GOALS: Target date: 05/23/2023   Pt will increase FOTO to at least 66 to demonstrate significant improvement in function at home and work related to neck pain  Baseline: 04/08/23: 42      05/25/23: 58/60 Goal status: IN PROGRESS   2.  Pt will decrease worst neck pain by at least 2 points on the NPRS in order to demonstrate clinically significant  reduction in neck pain. Baseline: 04/08/23: 8-9/10 at worst      05/25/23: 4/10 at worst Goal status: ACHIEVED    3.  Pt will demonstrates R shoulder active range of motion within 10 degrees of opposite upper limb for all planes without reproduction of pain as needed for reaching, lifting, overhead work, and self-care ADLs     Baseline: 04/08/23: limited R shoulder active flexion, ABD, IR.      05/25/23: Improved abduction and IR ROM; no significant change in flexion AROM.  Goal status: IN PROGRESS   4.  Patient will demonstrate cervical spine AROM WFL without reproduction of pain as needed for scanning environment, overhead activity, driving Baseline: 0/98/11: C-spine AROM limited in all planes of motion     05/25/23: Lacking extension and L rotation/lateral flexion. Pt has good flexion and R rotation.  Goal status: ON-GOING     PLAN: PT FREQUENCY: 1-2x/week   PT DURATION: 4 weeks   PLANNED INTERVENTIONS: Therapeutic exercises, Therapeutic activity, Neuromuscular re-education, Balance training, Gait training, Patient/Family education, Joint manipulation, Joint mobilization, Vestibular training, Canalith repositioning, Dry Needling, Electrical stimulation, Spinal manipulation, Spinal mobilization, Cryotherapy, Moist heat, Taping, Traction, Ultrasound, Ionotophoresis 4mg /ml Dexamethasone, and Manual therapy   PLAN FOR NEXT SESSION: Utilize cervical traction, STM, and manual therapy to improve mobility, progress HEP for shoulder ROM and  discuss modifications to home program prn. Assess response to repeated movement (lateral component MDT at this time) and utilize force progression prn. Progress C-spine ROM as tolerated.    Consuela Mimes, PT, DPT #[id number]  Gertie Exon, PT 06/08/2023, 8:19 AM

## 2023-06-15 ENCOUNTER — Ambulatory Visit: Payer: Medicare Other | Admitting: Physical Therapy

## 2023-06-17 ENCOUNTER — Encounter: Payer: Self-pay | Admitting: Neurosurgery

## 2023-06-17 ENCOUNTER — Ambulatory Visit: Payer: Medicare Other | Admitting: Neurosurgery

## 2023-06-17 VITALS — BP 124/84 | Ht 69.0 in | Wt 202.0 lb

## 2023-06-17 DIAGNOSIS — M5412 Radiculopathy, cervical region: Secondary | ICD-10-CM

## 2023-06-17 NOTE — Progress Notes (Signed)
Referring Physician:  No referring provider defined for this encounter.  Primary Physician:  Dortha Kern, MD  History of Present Illness: 06/17/2023 Shawn Atkins is doing slightly better than he was previously.  He continues to have some difficulty with pain when he turns his neck to the left.  He has numbness in the right C6 distribution.  His shoulder is improving.  03/16/2023 Shawn Atkins is here today with a chief complaint of neck pain that radiates down the right arm and into the hand, he also has numbness, tingling and weakness in the hand.   He began having symptoms after a right shoulder arthroscopy in October 2023.  He has pain into his right shoulder and numbness and tingling into the right lateral forearm and first and second digit of his right hand.  He has some difficulty with lifting his right arm up and has pain around the shoulder when he does this.  His discomfort can be as bad as 9 out of 10 and is sharp and stabbing.  Prolonged sitting and moving his neck make it worse.  He has significant neck pain.  Medication has helped.  He has had difficulty controlled diabetes and had his most recent A1c of 8.7%.  The previous epidural steroid injection made his sugars go up into the 500s.Bowel/Bladder Dysfunction: none  Conservative measures: massage, heat, ice Physical therapy: has participated in 5 visits at [facility], but has not been discharged.  Multimodal medical therapy including regular antiinflammatories: flexeril, biofreeeze Injections:  has received epidural steroid injections 10/28/22:C7-T1 IL ESI at [facility] (60% relief)  Past Surgery: denies  [name] has no symptoms of cervical myelopathy.  The symptoms are causing a significant impact on the patient's life.   Progress Note from Drake Leach, [place] on 01/18/23:   History of Present Illness: 01/18/2023 Shawn Atkins has a history of BPH and DM.    He is s/p right shoulder surgery in October with Dr.  Martha Clan. He was seeing Dr. Noralyn Pick and had cervical ESI. ACDF C5-C6 discussed and he was to get HgbA1c below 7.5. No further ESIs recommended as BS went to 560 with injection per patient.    He has constant neck pain that radiates into right hand to thump and sometimes to his right small finger. He has numbness, tingling, and weakness in right hand. Pain is worse with prolonged sitting and moving his neck. Some relief with motrin and OTC biofreeze.    His last HgbA1c was 10.5 on 11/04/22.    I have utilized the care everywhere function in epic to review the outside records available from external health systems.  Review of Systems:  A 10 point review of systems is negative, except for the pertinent positives and negatives detailed in the HPI.  Past Medical History: Past Medical History:  Diagnosis Date   BPH (benign prostatic hypertrophy)    Bronchitis    Chronic prostatitis    Complication of anesthesia    Felt like "couldn't breathe" after triple Hernia surgery   Diabetes mellitus without complication (HCC)    Flank pain    GERD (gastroesophageal reflux disease)    h/o   Gross hematuria    History of kidney stones    HTN (hypertension)    pt states he takes lisinopril for kidney protection due to dm not htn   Inguinal hernia    Nocturia    OSA on CPAP    with 02 2 L   Overweight  PONV (postoperative nausea and vomiting)    only during kidney stone surgery   Spermatocele    Stricture of urethra    Vertigo    1 episode, 6-7 yrs ago    Past Surgical History: Past Surgical History:  Procedure Laterality Date   BICEPT TENODESIS Right 08/06/2022   Procedure: BICEPS TENODESIS;  Surgeon: Juanell Fairly, MD;  Location: [facility] ORS;  Service: Orthopedics;  Laterality: Right;   CATARACT EXTRACTION W/PHACO Left 07/22/2020   Procedure: CATARACT EXTRACTION PHACO AND INTRAOCULAR LENS PLACEMENT (IOC) LEFT DIABETIC 4.14  00:33.0;  Surgeon: Nevada Crane, MD;  Location: [facility]  SURGERY [facility];  Service: Ophthalmology;  Laterality: Left;  Diabetic - oral meds   CATARACT EXTRACTION W/PHACO Right 08/12/2020   Procedure: CATARACT EXTRACTION PHACO AND INTRAOCULAR LENS PLACEMENT (IOC) RIGHT DIABETIC;  Surgeon: Nevada Crane, MD;  Location: [facility]ERY [facility];  Service: Ophthalmology;  Laterality: Right;  1.99 0:26.2   COLONOSCOPY WITH PROPOFOL N/A 11/20/2015   Procedure: COLONOSCOPY WITH PROPOFOL;  Surgeon: Kieth Brightly, MD;  Location: [facility] ENDOSCOPY;  Service: Endoscopy;  Laterality: N/A;   HERNIA REPAIR Bilateral 2014   umbilical and bil inguinal/ Dr Egbert Garibaldi   KIDNEY STONE SURGERY     KNEE ARTHROSCOPY Left    open lithotomy     SHOULDER ARTHROSCOPY WITH OPEN ROTATOR CUFF REPAIR AND DISTAL CLAVICLE ACROMINECTOMY Right 08/06/2022   Procedure: SHOULDER ARTHROSCOPY WITH OPEN ROTATOR CUFF REPAIR AND DISTAL CLAVICLE ACROMINECTOMY;  Surgeon: Juanell Fairly, MD;  Location: [facility] ORS;  Service: Orthopedics;  Laterality: Right;    Allergies: Allergies as of 06/17/2023 - Review Complete 06/17/2023  Allergen Reaction Noted   Penicillins Anaphylaxis 06/14/2015   Morphine and codeine Nausea And Vomiting 06/14/2015    Medications:  Current Outpatient Medications:    albuterol (PROVENTIL) (2.5 MG/3ML) 0.083% nebulizer solution, Take 2.5 mg by nebulization every 6 (six) hours as needed for wheezing or shortness of breath (Last used in the winter of 2022)., Disp: , Rfl:    atorvastatin (LIPITOR) 20 MG tablet, Take 20 mg by mouth every evening., Disp: , Rfl:    BIOTIN PO, Take 1 tablet by mouth daily., Disp: , Rfl:    Blood Glucose Monitoring Suppl (ONE TOUCH ULTRA 2) w/Device KIT, SMARTSIG:Via Meter, Disp: , Rfl:    glucosamine-chondroitin 500-400 MG tablet, Take 1 tablet by mouth 2 (two) times daily., Disp: , Rfl:    Lancets (ONETOUCH DELICA PLUS LANCET33G) MISC, Apply 1 each topically daily., Disp: , Rfl:    lisinopril (ZESTRIL) 10 MG tablet, Take 10 mg by mouth every  morning., Disp: , Rfl:    metFORMIN (GLUCOPHAGE-XR) 500 MG 24 hr tablet, Take 1,000 mg by mouth 2 (two) times daily with a meal., Disp: , Rfl:    Multiple Vitamin (MULTIVITAMIN) tablet, Take 1 tablet by mouth daily., Disp: , Rfl:    NEEDLE, DISP, 18 G (BD DISP NEEDLES) 18G X 1-1/2" MISC, 1 mg by Does not apply route every 14 (fourteen) days., Disp: 50 each, Rfl: 0   NEEDLE, DISP, 21 G (BD DISP NEEDLES) 21G X 1-1/2" MISC, 1 mg by Does not apply route every 14 (fourteen) days., Disp: 50 each, Rfl: 0   ONETOUCH ULTRA TEST test strip, USE 1 STRIP TO CHECK GLUCOSE ONCE DAILY, Disp: , Rfl:    OZEMPIC, 0.25 OR 0.5 MG/DOSE, 2 MG/3ML SOPN, Inject 0.5 mg into the skin once a week., Disp: , Rfl:    pioglitazone (ACTOS) 15 MG tablet, Take 15 mg by mouth daily.,  Disp: , Rfl:    Syringe, Disposable, (2-3CC SYRINGE) 3 ML MISC, 1 mg by Does not apply route every 14 (fourteen) days., Disp: 25 each, Rfl: 3   tadalafil (CIALIS) 5 MG tablet, Take 1 tablet (5 mg total) by mouth every morning. BPH, Disp: 90 tablet, Rfl: 3   testosterone cypionate (DEPOTESTOSTERONE CYPIONATE) 200 MG/ML injection, Inject 1 mL (200 mg total) into the muscle every 14 (fourteen) days. INJECT 1 ML INTO MUSCLE EVERY 14 DAYS, Disp: 10 mL, Rfl: 0  Social History: Social History   Tobacco Use   Smoking status: Former    Current packs/day: 0.00    Average packs/day: 1 pack/day for 15.0 years (15.0 ttl pk-yrs)    Types: Cigarettes    Start date: 3    Quit date: 50    Years since quitting: 39.6    Passive exposure: Past   Smokeless tobacco: Current    Types: Snuff   Tobacco comments:    Smokless and snuff  Vaping Use   Vaping status: Never Used  Substance Use Topics   Alcohol use: Yes    Alcohol/week: 1.0 standard drink of alcohol    Types: 1 Cans of beer per week    Comment: 3 beers daily   Drug use: No    Family Medical History: Family History  Problem Relation Age of Onset   Benign prostatic hyperplasia Father     Kidney disease Neg Hx    Prostate cancer Neg Hx     Physical Examination: There were no vitals filed for this visit.   General: Patient is in no apparent distress. Attention to examination is appropriate.  Neck:   Supple.  Full range of motion.  Respiratory: Patient is breathing without any difficulty.   NEUROLOGICAL:     Awake, alert, oriented to person, place, and time.  Speech is clear and fluent.   Cranial Nerves: Pupils equal round and reactive to light.  Facial tone is symmetric.  Facial sensation is symmetric. Shoulder shrug is symmetric. Tongue protrusion is midline.  There is no pronator drift.  Strength: Side Biceps Triceps Deltoid Interossei Grip Wrist Ext. Wrist Flex.  R 5 5 4 5 5 5 5   L 5 5 4+ 5 5 5 5    Side Iliopsoas Quads Hamstring PF DF EHL  R 5 5 5 5 5 5   L 5 5 5 5 5 5    Reflexes are 1+ and symmetric at the biceps, triceps, brachioradialis, patella and achilles.   Hoffman's is absent.   Bilateral upper and lower extremity sensation is intact to light touch with exception of right arm which shows altered sensation in the right C6 distribution on the dorsal aspect of his hand and in his forearm.  The palmar aspect of his right hand has preserved sensation on my examination.    No evidence of dysmetria noted.  Gait is normal.     Medical Decision Making  Imaging: MRI of the cervical spine from June 16, 2022 shows severe degenerative disc disease from C5-6 through C7-T1.  There is foraminal stenosis on the right side at C3-4 and at C7-T1 that is rated moderate.  There is a rightward disc protrusion at C5-6 which contour is the anterior surface of the right side of the spinal cord.  MRI C spine 04/30/2023 IMPRESSION: 1. C4-C5 moderate to severe left neural foraminal narrowing, slightly progressed from the prior exam. 2. C3-C4 and C5-C6 moderate right and mild left neural foraminal narrowing, unchanged. 3. C6-C7 mild  left neural foraminal narrowing,  unchanged. 4. C7-T1 moderate right neural foraminal narrowing, unchanged.     Electronically Signed   By: Wiliam Ke M.D.  I have personally reviewed the images and agree with the above interpretation.  EMG 04/12/2023 Impression: This is an abnormal electrodiagnostic exam consistent with a chronic C6 radiculopathy on the right.   Thank you for the referral of this patient. It was our privilege to participate in care of your patient. Feel free to contact us with any further questions.  _____________________________ Cristopher Peru, M.D.   Assessment and Plan: Shawn Atkins is a pleasant 67 y.o. male with new neurologic symptoms down his right arm that occurred after his right shoulder arthroscopy.  This is most consistent with right C6 distribution, which was confirmed on nerve conduction study.  At this point, I would not recommend surgical intervention.  I do not think it is likely to help his chronic radiculopathy.  If his symptoms change over time, we will readdress the situation.  I think he is okay to return to work if he feels he can complete his job tasks.  He will let me know if he needs any documentation of this.    I will see him back on an as-needed basis.  Thank you for involving me in the care of this patient.      Chinonso Linker K. Myer Haff MD, [facility] Neurosurgery

## 2023-08-16 ENCOUNTER — Other Ambulatory Visit: Payer: Self-pay | Admitting: *Deleted

## 2023-08-16 DIAGNOSIS — E349 Endocrine disorder, unspecified: Secondary | ICD-10-CM

## 2023-08-16 MED ORDER — TESTOSTERONE CYPIONATE 200 MG/ML IM SOLN: 200.0000 mg | INTRAMUSCULAR | 0 refills | Status: DC

## 2023-08-20 ENCOUNTER — Other Ambulatory Visit: Payer: Self-pay | Admitting: Urology

## 2023-08-21 ENCOUNTER — Other Ambulatory Visit: Payer: Self-pay | Admitting: Urology

## 2023-09-29 ENCOUNTER — Other Ambulatory Visit: Payer: Self-pay | Admitting: Urology

## 2023-09-29 DIAGNOSIS — E349 Endocrine disorder, unspecified: Secondary | ICD-10-CM

## 2023-09-29 NOTE — Telephone Encounter (Signed)
Spoke with patient and advised results Orders placed

## 2023-10-04 ENCOUNTER — Other Ambulatory Visit
Admission: RE | Admit: 2023-10-04 | Discharge: 2023-10-04 | Disposition: A | Discharge status: Home / Self Care | Payer: Medicare Other | Attending: Urology | Admitting: Urology

## 2023-10-04 DIAGNOSIS — E349 Endocrine disorder, unspecified: Secondary | ICD-10-CM | POA: Insufficient documentation

## 2023-10-04 DIAGNOSIS — E291 Testicular hypofunction: Secondary | ICD-10-CM | POA: Insufficient documentation

## 2023-10-04 LAB — HEMOGLOBIN AND HEMATOCRIT, BLOOD
HCT: 48.7 % (ref 39.0–52.0)
Hemoglobin: 16.4 g/dL (ref 13.0–17.0)

## 2023-10-04 LAB — PSA: Prostatic Specific Antigen: 0.29 ng/mL (ref 0.00–4.00)

## 2023-10-05 LAB — TESTOSTERONE: Testosterone: 911 ng/dL (ref 264–916)

## 2023-10-06 NOTE — Progress Notes (Signed)
10/11/23 11:04 AM   [name] 11/23/1955 [id number]  Referring provider:  Dortha Kern, MD [address] [place],  [place] [address]  Urological history: 1. BPH with LU TS -PSA (012/2024) 0.29 -tadalafil 5 mg daily  2. Nocturia -Risk factors for nocturia: hypertension and BPH.     3. ED -contributing factors of age, BPH, testosterone deficiency and DM -tadalafil 5 mg daily    4. High risk hematuria -Former smoker -CTU 2014  left kidney demonstrates focal wedge-shaped indentation with near complete cortical loss within the lateral aspect of the mid pole of the kidney. A focal cyst is identified in this  area demonstrating Hounsfield units of 1 and 7 and measures approximately 1 cm -no cystoscopy for unknown reasons   5. Nephrolithiasis -contrast CT in 2016 notes 2 mm right mid kidney nonobstructive calculus. 1-2 mm right kidney lower pole nonobstructive calculus. Potential 1 mm left kidney lower pole nonobstructive calculus -no stones seen on contrast CT in 2019  6. Prostate cancer screening -PSA (09/2023) 0.29 -AUA guidelines (2023) recommend screening for prostate cancer in men ages 44 to 84 every 2 to 4 years -no family history of prostate cancer, breast cancer or ovarian cancer  -he will have yearly screening secondary to TRT  7. Testosterone deficiency -contributing factors of age, diabetes and obesity -testosterone level (09/2023) 911 -hemoglobin/hematocrit (09/2023) 16.4/48.7 -Testosterone cypionate 200/milliliters, 1 cc every 14 days  Chief Complaint  Patient presents with   Hypogonadism    HPI: Shawn Atkins is a 67 y.o.male who presents today follow up.     Previous records reviewed  I PSS 11/2  He has no urinary complaints.  Patient denies any modifying or aggravating factors.  Patient denies any recent UTI's, gross hematuria, dysuria or suprapubic/flank pain.  Patient denies any fevers, chills, nausea or vomiting.     IPSS     Row Name  10/11/23 1000         International Prostate Symptom Score   How often have you had the sensation of not emptying your bladder? Less than half the time     How often have you had to urinate less than every two hours? Less than half the time     How often have you found you stopped and started again several times when you urinated? Less than half the time     How often have you found it difficult to postpone urination? Less than 1 in 5 times     How often have you had a weak urinary stream? Less than 1 in 5 times     How often have you had to strain to start urination? Less than 1 in 5 times     How many times did you typically get up at night to urinate? 2 Times     Total IPSS Score 11       Quality of Life due to urinary symptoms   If you were to spend the rest of your life with your urinary condition just the way it is now how would you feel about that? Mostly Satisfied             Score:  1-7 Mild 8-19 Moderate 20-35 Severe    SHIM 20  He is having satisfactory erections.  Patient still having spontaneous erections.  He denies any pain or curvature with erections.     SHIM     Row Name 10/11/23 1041  SHIM: Over the last 6 months:   How do you rate your confidence that you could get and keep an erection? Moderate     When you had erections with sexual stimulation, how often were your erections hard enough for penetration (entering your partner)? Most Times (much more than half the time)     During sexual intercourse, how often were you able to maintain your erection after you had penetrated (entered) your partner? Most Times (much more than half the time)     During sexual intercourse, how difficult was it to maintain your erection to completion of intercourse? Slightly Difficult     When you attempted sexual intercourse, how often was it satisfactory for you? Almost Always or Always       SHIM Total Score   SHIM 20             Score: 1-7 Severe ED 8-11  Moderate ED 12-16 Mild-Moderate ED 17-21 Mild ED 22-25 No ED     PMH: Past Medical History:  Diagnosis Date   BPH (benign prostatic hypertrophy)    Bronchitis    Chronic prostatitis    Complication of anesthesia    Felt like "couldn't breathe" after triple Hernia surgery   Diabetes mellitus without complication (HCC)    Flank pain    GERD (gastroesophageal reflux disease)    h/o   Gross hematuria    History of kidney stones    HTN (hypertension)    pt states he takes lisinopril for kidney protection due to dm not htn   Inguinal hernia    Nocturia    OSA on CPAP    with 02 2 L   Overweight    PONV (postoperative nausea and vomiting)    only during kidney stone surgery   Spermatocele    Stricture of urethra    Vertigo    1 episode, 6-7 yrs ago    Surgical History: Past Surgical History:  Procedure Laterality Date   BICEPT TENODESIS Right 08/06/2022   Procedure: BICEPS TENODESIS;  Surgeon: Juanell Fairly, MD;  Location: [facility] ORS;  Service: Orthopedics;  Laterality: Right;   CATARACT EXTRACTION W/PHACO Left 07/22/2020   Procedure: CATARACT EXTRACTION PHACO AND INTRAOCULAR LENS PLACEMENT (IOC) LEFT DIABETIC 4.14  00:33.0;  Surgeon: Nevada Crane, MD;  Location: [facility];  Service: Ophthalmology;  Laterality: Left;  Diabetic - oral meds   CATARACT EXTRACTION W/PHACO Right 08/12/2020   Procedure: CATARACT EXTRACTION PHACO AND INTRAOCULAR LENS PLACEMENT (IOC) RIGHT DIABETIC;  Surgeon: Nevada Crane, MD;  Location: [facility];  Service: Ophthalmology;  Laterality: Right;  1.99 0:26.2   COLONOSCOPY WITH PROPOFOL N/A 11/20/2015   Procedure: COLONOSCOPY WITH PROPOFOL;  Surgeon: Kieth Brightly, MD;  Location: [facility] ENDOSCOPY;  Service: Endoscopy;  Laterality: N/A;   HERNIA REPAIR Bilateral 2014   umbilical and bil inguinal/ Dr Egbert Garibaldi   KIDNEY STONE SURGERY     KNEE ARTHROSCOPY Left    open lithotomy     SHOULDER ARTHROSCOPY WITH OPEN ROTATOR  CUFF REPAIR AND DISTAL CLAVICLE ACROMINECTOMY Right 08/06/2022   Procedure: SHOULDER ARTHROSCOPY WITH OPEN ROTATOR CUFF REPAIR AND DISTAL CLAVICLE ACROMINECTOMY;  Surgeon: Juanell Fairly, MD;  Location: [facility] ORS;  Service: Orthopedics;  Laterality: Right;    Home Medications:  Allergies as of 10/11/2023       Reactions   Penicillins Anaphylaxis   Morphine And Codeine Nausea And Vomiting        Medication List  Accurate as of October 11, 2023 11:04 AM. If you have any questions, ask your nurse or doctor.          STOP taking these medications    BD Hypodermic Needle 18G X 1" Misc Generic drug: NEEDLE (DISP) 18 G Replaced by: BD Disp Needles 18G X 1-1/2" Misc       TAKE these medications    2-3CC SYRINGE 3 ML Misc 1 mg by Does not apply route every 14 (fourteen) days. What changed: Another medication with the same name was added. Make sure you understand how and when to take each.   2-3CC SYRINGE 3 ML Misc 1 mg by Does not apply route every 14 (fourteen) days. What changed: You were already taking a medication with the same name, and this prescription was added. Make sure you understand how and when to take each.   albuterol (2.5 MG/3ML) 0.083% nebulizer solution Commonly known as: PROVENTIL Take 2.5 mg by nebulization every 6 (six) hours as needed for wheezing or shortness of breath (Last used in the winter of 2022).   atorvastatin 20 MG tablet Commonly known as: LIPITOR Take 20 mg by mouth every evening.   BD Disp Needles 18G X 1-1/2" Misc Generic drug: NEEDLE (DISP) 18 G 1 mg by Does not apply route every 14 (fourteen) days. Replaces: BD Hypodermic Needle 18G X 1" Misc   BD Disp Needles 21G X 1-1/2" Misc Generic drug: NEEDLE (DISP) 21 G Use every 14 days What changed: Another medication with the same name was added. Make sure you understand how and when to take each.   BD Disp Needles 21G X 1-1/2" Misc Generic drug: NEEDLE (DISP) 21 G 1 mg by  Does not apply route every 14 (fourteen) days. What changed: You were already taking a medication with the same name, and this prescription was added. Make sure you understand how and when to take each.   BIOTIN PO Take 1 tablet by mouth daily.   glucosamine-chondroitin 500-400 MG tablet Take 1 tablet by mouth 2 (two) times daily.   lisinopril 10 MG tablet Commonly known as: ZESTRIL Take 10 mg by mouth every morning.   metFORMIN 500 MG 24 hr tablet Commonly known as: GLUCOPHAGE-XR Take 1,000 mg by mouth 2 (two) times daily with a meal.   multivitamin tablet Take 1 tablet by mouth daily.   ONE TOUCH ULTRA 2 w/Device Kit SMARTSIG:Via Meter   OneTouch Delica Plus Lancet33G Misc Apply 1 each topically daily.   OneTouch Ultra Test test strip Generic drug: glucose blood USE 1 STRIP TO CHECK GLUCOSE ONCE DAILY   Ozempic (0.25 or 0.5 MG/DOSE) 2 MG/3ML Sopn Generic drug: Semaglutide(0.25 or 0.5MG /DOS) Inject 0.5 mg into the skin once a week.   pioglitazone 15 MG tablet Commonly known as: ACTOS Take 15 mg by mouth daily.   tadalafil 5 MG tablet Commonly known as: CIALIS Take 1 tablet (5 mg total) by mouth every morning. BPH   testosterone cypionate 200 MG/ML injection Commonly known as: DEPOTESTOSTERONE CYPIONATE Inject 0.75 cc every 14 days What changed:  how much to take how to take this when to take this additional instructions        Allergies:  Allergies  Allergen Reactions   Penicillins Anaphylaxis   Morphine And Codeine Nausea And Vomiting    Family History: Family History  Problem Relation Age of Onset   Benign prostatic hyperplasia Father    Kidney disease Neg Hx    Prostate cancer Neg Hx  Social History:  reports that he quit smoking about 39 years ago. His smoking use included cigarettes. He started smoking about 54 years ago. He has a 15 pack-year smoking history. He has been exposed to tobacco smoke. His smokeless tobacco use includes snuff.  He reports current alcohol use of about 1.0 standard drink of alcohol per week. He reports that he does not use drugs.   Physical Exam: BP 132/76 (BP Location: Left Arm, Patient Position: Sitting, Cuff Size: Normal)   Pulse 80   Ht 5\' 9"  (1.753 m)   Wt 200 lb (90.7 kg)   BMI 29.53 kg/m   Constitutional:  Well nourished. Alert and oriented, No acute distress. HEENT: [place] AT, moist mucus membranes.  Trachea midline Cardiovascular: No clubbing, cyanosis, or edema. Respiratory: Normal respiratory effort, no increased work of breathing. Neurologic: Grossly intact, no focal deficits, moving all 4 extremities. Psychiatric: Normal mood and affect.   Laboratory Data: Results for orders placed or performed during the hospital encounter of 10/04/23  Hemoglobin and hematocrit, blood   Collection Time: 10/04/23  9:51 AM  Result Value Ref Range   Hemoglobin 16.4 13.0 - 17.0 g/dL   HCT 46.9 62.9 - 52.8 %  Testosterone   Collection Time: 10/04/23  9:51 AM  Result Value Ref Range   Testosterone 911 264 - 916 ng/dL  PSA   Collection Time: 10/04/23  9:51 AM  Result Value Ref Range   Prostatic Specific Antigen 0.29 0.00 - 4.00 ng/mL   I have reviewed the labs.   Pertinent Imaging N/A  Assessment & Plan:    1. Testosterone deficiency  -testosterone levels are super therapeutic -H & H WNL  -decrease testosterone cypionate 200 mg/milliliters, 0.75 cc every 14 days  -Will recheck free and total testosterone, hemoglobin hematocrit in 3 months 1 week after the injection  2. BPH with LUTS -PSA stable  -symptoms - frequency  -continue conservative management, avoiding bladder irritants and timed voiding's -Continue Cialis 5 mg daily   3. ED -at goal with tadalafil 5 mg   Return in about 3 months (around 01/09/2024) for Free and total testosterone, hemoglobin and hematocrit only.  Cloretta Ned   [facility] [address] [place], [place]  [address] [phone]

## 2023-10-11 ENCOUNTER — Ambulatory Visit: Payer: Medicare Other | Admitting: Urology

## 2023-10-11 ENCOUNTER — Encounter: Payer: Self-pay | Admitting: Urology

## 2023-10-11 VITALS — BP 132/76 | HR 80 | Ht 69.0 in | Wt 200.0 lb

## 2023-10-11 DIAGNOSIS — N401 Enlarged prostate with lower urinary tract symptoms: Secondary | ICD-10-CM

## 2023-10-11 DIAGNOSIS — N529 Male erectile dysfunction, unspecified: Secondary | ICD-10-CM

## 2023-10-11 DIAGNOSIS — E349 Endocrine disorder, unspecified: Secondary | ICD-10-CM

## 2023-10-11 DIAGNOSIS — E291 Testicular hypofunction: Secondary | ICD-10-CM | POA: Diagnosis not present

## 2023-10-11 DIAGNOSIS — N138 Other obstructive and reflux uropathy: Secondary | ICD-10-CM

## 2023-10-11 MED ORDER — "BD DISP NEEDLES 18G X 1-1/2"" MISC": 1.0000 mg | 0 refills | Status: DC

## 2023-10-11 MED ORDER — "BD DISP NEEDLES 21G X 1-1/2"" MISC": 1.0000 mg | 0 refills | Status: DC

## 2023-10-11 MED ORDER — SYRINGE 2-3 ML 3 ML MISC: 1.0000 mg | 3 refills | Status: AC

## 2023-10-11 MED ORDER — TESTOSTERONE CYPIONATE 200 MG/ML IM SOLN: INTRAMUSCULAR | 0 refills | Status: DC

## 2023-10-11 NOTE — Patient Instructions (Signed)
Remember to go to the [facility] lab to get your blood drawn in 3 months 1 week after an injection of testosterone

## 2023-11-01 ENCOUNTER — Other Ambulatory Visit: Payer: Self-pay | Admitting: Urology

## 2023-11-01 DIAGNOSIS — E349 Endocrine disorder, unspecified: Secondary | ICD-10-CM

## 2023-11-01 MED ORDER — "BD DISP NEEDLES 21G X 1-1/2"" MISC": 1.0000 mg | 0 refills | Status: DC

## 2023-11-01 MED ORDER — "BD DISP NEEDLES 18G X 1-1/2"" MISC": 1.0000 mg | 0 refills | Status: DC

## 2023-11-01 MED ORDER — SYRINGE 2-3 ML 3 ML MISC: 1.0000 mg | 3 refills | Status: DC

## 2023-11-01 MED ORDER — TESTOSTERONE CYPIONATE 200 MG/ML IM SOLN: INTRAMUSCULAR | 0 refills | Status: DC

## 2023-12-14 ENCOUNTER — Other Ambulatory Visit: Payer: Self-pay | Admitting: Urology

## 2023-12-14 DIAGNOSIS — N138 Other obstructive and reflux uropathy: Secondary | ICD-10-CM

## 2024-01-06 ENCOUNTER — Other Ambulatory Visit
Admission: RE | Admit: 2024-01-06 | Discharge: 2024-01-06 | Disposition: A | Discharge status: Home / Self Care | Attending: Urology | Admitting: Urology

## 2024-01-06 ENCOUNTER — Other Ambulatory Visit: Payer: Self-pay | Admitting: Urology

## 2024-01-06 DIAGNOSIS — E291 Testicular hypofunction: Secondary | ICD-10-CM | POA: Diagnosis present

## 2024-01-06 LAB — HEMOGLOBIN AND HEMATOCRIT, BLOOD
HCT: 53.9 % — ABNORMAL HIGH (ref 39.0–52.0)
Hemoglobin: 18 g/dL — ABNORMAL HIGH (ref 13.0–17.0)

## 2024-01-06 NOTE — Progress Notes (Signed)
 01/10/24 11:02 AM   Shawn Atkins 01/08/1956 [id number]  Referring provider:  Dortha Kern, MD [address] [place],  [place] [address]  Urological history: 1. BPH with LU TS -PSA (012/2024) 0.29 -tadalafil 5 mg daily  2. Nocturia -Risk factors for nocturia: hypertension and BPH.     3. ED -contributing factors of age, BPH, testosterone deficiency and DM -tadalafil 5 mg daily    4. High risk hematuria -Former smoker -CTU 2014  left kidney demonstrates focal wedge-shaped indentation with near complete cortical loss within the lateral aspect of the mid pole of the kidney. A focal cyst is identified in this  area demonstrating Hounsfield units of 1 and 7 and measures approximately 1 cm -no cystoscopy for unknown reasons   5. Nephrolithiasis -contrast CT in 2016 notes 2 mm right mid kidney nonobstructive calculus. 1-2 mm right kidney lower pole nonobstructive calculus. Potential 1 mm left kidney lower pole nonobstructive calculus -no stones seen on contrast CT in 2019  6. Prostate cancer screening -PSA (09/2023) 0.29 -AUA guidelines (2023) recommend screening for prostate cancer in men ages 62 to 83 every 2 to 4 years -no family history of prostate cancer, breast cancer or ovarian cancer  -he will have yearly screening secondary to TRT  7. Testosterone deficiency -contributing factors of age, diabetes and obesity -testosterone level pending  -hemoglobin/hematocrit pending  -Testosterone cypionate 200/milliliters, 1 cc every 14 days  Chief Complaint  Patient presents with   Hypogonadism    HPI: Shawn Atkins is a 68 y.o.male who presents today follow up.     Previous records reviewed  He is feeling well with the testosterone replacement therapy.  He is having satisfactory erections and good energy.  His hemoglobin/hematocrit have returned elevated at 18.0 and 53.9.  His testosterone level is still pending at the moment.  Patient denies any modifying or  aggravating factors.  Patient denies any recent UTI's, gross hematuria, dysuria or suprapubic/flank pain.  Patient denies any fevers, chills, nausea or vomiting.    Patient still having spontaneous erections.  He denies any pain or curvature with erections.     PMH: Past Medical History:  Diagnosis Date   BPH (benign prostatic hypertrophy)    Bronchitis    Chronic prostatitis    Complication of anesthesia    Felt like "couldn't breathe" after triple Hernia surgery   Diabetes mellitus without complication (HCC)    Flank pain    GERD (gastroesophageal reflux disease)    h/o   Gross hematuria    History of kidney stones    HTN (hypertension)    pt states he takes lisinopril for kidney protection due to dm not htn   Inguinal hernia    Nocturia    OSA on CPAP    with 02 2 L   Overweight    PONV (postoperative nausea and vomiting)    only during kidney stone surgery   Spermatocele    Stricture of urethra    Vertigo    1 episode, 6-7 yrs ago    Surgical History: Past Surgical History:  Procedure Laterality Date   BICEPT TENODESIS Right 08/06/2022   Procedure: BICEPS TENODESIS;  Surgeon: Juanell Fairly, MD;  Location: [facility];  Service: Orthopedics;  Laterality: Right;   CATARACT EXTRACTION W/PHACO Left 07/22/2020   Procedure: CATARACT EXTRACTION PHACO AND INTRAOCULAR LENS PLACEMENT (IOC) LEFT DIABETIC 4.14  00:33.0;  Surgeon: Nevada Crane, MD;  Location: [facility];  Service: Ophthalmology;  Laterality: Left;  Diabetic - oral meds   CATARACT EXTRACTION W/PHACO Right 08/12/2020   Procedure: CATARACT EXTRACTION PHACO AND INTRAOCULAR LENS PLACEMENT (IOC) RIGHT DIABETIC;  Surgeon: Nevada Crane, MD;  Location: [facility];  Service: Ophthalmology;  Laterality: Right;  1.99 0:26.2   COLONOSCOPY WITH PROPOFOL N/A 11/20/2015   Procedure: COLONOSCOPY WITH PROPOFOL;  Surgeon: Kieth Brightly, MD;  Location: [facility];  Service: Endoscopy;   Laterality: N/A;   HERNIA REPAIR Bilateral 2014   umbilical and bil inguinal/ Dr Egbert Garibaldi   KIDNEY STONE SURGERY     KNEE ARTHROSCOPY Left    open lithotomy     SHOULDER ARTHROSCOPY WITH OPEN ROTATOR CUFF REPAIR AND DISTAL CLAVICLE ACROMINECTOMY Right 08/06/2022   Procedure: SHOULDER ARTHROSCOPY WITH OPEN ROTATOR CUFF REPAIR AND DISTAL CLAVICLE ACROMINECTOMY;  Surgeon: Juanell Fairly, MD;  Location: [facility];  Service: Orthopedics;  Laterality: Right;    Home Medications:  Allergies as of 01/10/2024       Reactions   Penicillins Anaphylaxis   Morphine And Codeine Nausea And Vomiting        Medication List        Accurate as of January 10, 2024 11:02 AM. If you have any questions, ask your nurse or doctor.          2-3CC SYRINGE 3 ML Misc 1 mg by Does not apply route every 14 (fourteen) days.   2-3CC SYRINGE 3 ML Misc 1 mg by Does not apply route every 14 (fourteen) days.   albuterol (2.5 MG/3ML) 0.083% nebulizer solution Commonly known as: PROVENTIL Take 2.5 mg by nebulization every 6 (six) hours as needed for wheezing or shortness of breath (Last used in the winter of 2022).   atorvastatin 20 MG tablet Commonly known as: LIPITOR Take 20 mg by mouth every evening.   BD Disp Needles 18G X 1-1/2" Misc Generic drug: NEEDLE (DISP) 18 G 1 mg by Does not apply route every 14 (fourteen) days.   BD Disp Needles 21G X 1-1/2" Misc Generic drug: NEEDLE (DISP) 21 G Use every 14 days   BD Disp Needles 21G X 1-1/2" Misc Generic drug: NEEDLE (DISP) 21 G 1 mg by Does not apply route every 14 (fourteen) days.   BIOTIN PO Take 1 tablet by mouth daily.   glucosamine-chondroitin 500-400 MG tablet Take 1 tablet by mouth 2 (two) times daily.   lisinopril 10 MG tablet Commonly known as: ZESTRIL Take 10 mg by mouth every morning.   metFORMIN 500 MG 24 hr tablet Commonly known as: GLUCOPHAGE-XR Take 1,000 mg by mouth 2 (two) times daily with a meal.   multivitamin tablet Take  1 tablet by mouth daily.   ONE TOUCH ULTRA 2 w/Device Kit SMARTSIG:Via Meter   OneTouch Delica Plus Lancet33G Misc Apply 1 each topically daily.   OneTouch Ultra Test test strip Generic drug: glucose blood USE 1 STRIP TO CHECK GLUCOSE ONCE DAILY   Ozempic (0.25 or 0.5 MG/DOSE) 2 MG/3ML Sopn Generic drug: Semaglutide(0.25 or 0.5MG /DOS) Inject 0.5 mg into the skin once a week.   pioglitazone 15 MG tablet Commonly known as: ACTOS Take 15 mg by mouth daily.   tadalafil 5 MG tablet Commonly known as: CIALIS TAKE 1 TABLET BY MOUTH EVERY MORNING   testosterone cypionate 200 MG/ML injection Commonly known as: DEPOTESTOSTERONE CYPIONATE Inject 0.75 cc every 14 days        Allergies:  Allergies  Allergen Reactions   Penicillins Anaphylaxis   Morphine And Codeine Nausea And Vomiting  Family History: Family History  Problem Relation Age of Onset   Benign prostatic hyperplasia Father    Kidney disease Neg Hx    Prostate cancer Neg Hx     Social History:  reports that he quit smoking about 40 years ago. His smoking use included cigarettes. He started smoking about 55 years ago. He has a 15 pack-year smoking history. He has been exposed to tobacco smoke. His smokeless tobacco use includes snuff. He reports current alcohol use of about 1.0 standard drink of alcohol per week. He reports that he does not use drugs.   Physical Exam: BP 115/73   Pulse 87   Ht 5\' 9"  (1.753 m)   Wt 203 lb 4 oz (92.2 kg)   BMI 30.01 kg/m   Constitutional:  Well nourished. Alert and oriented, No acute distress. HEENT:  AT, moist mucus membranes.  Trachea midline Cardiovascular: No clubbing, cyanosis, or edema. Respiratory: Normal respiratory effort, no increased work of breathing. Neurologic: Grossly intact, no focal deficits, moving all 4 extremities. Psychiatric: Normal mood and affect.   Laboratory Data: Results for orders placed or performed during the hospital encounter of 01/06/24   Hemoglobin and hematocrit, blood   Collection Time: 01/06/24 10:31 AM  Result Value Ref Range   Hemoglobin 18.0 (H) 13.0 - 17.0 g/dL   HCT 40.9 (H) 81.1 - 91.4 %   I have reviewed the labs.   Pertinent Imaging N/A  Assessment & Plan:    1. Testosterone deficiency  -testosterone levels are pending  -H & H elevated explained that this can put him at risk for heart attack and stroke -Discussed other donating blood or discontinuing the testosterone therapy and restarting in 2 months at a decreased dose, he chose the latter as he is adverse to needles -He will go ahead and hold the testosterone at this time and then we will repeat his labs mid June, testosterone, hemoglobin and hematocrit and if they have normalized, we will restart the testosterone cypionate 200 mg/milliliters at 0.5 cc every 14 days and then recheck his testosterone level, hemoglobin, hematocrit and PSA 1 week after his fourth injection along with an office visit with me  2. BPH with LUTS -PSA stable  -symptoms - frequency  -continue conservative management, avoiding bladder irritants and timed voiding's -Continue Cialis 5 mg daily   3. ED -at goal with tadalafil 5 mg   Return for testosterone, H & H only in mid June .  Cloretta Ned   [facility] [address] [place], [place] [address] [phone]

## 2024-01-10 ENCOUNTER — Ambulatory Visit: Payer: Medicare Other | Admitting: Urology

## 2024-01-10 ENCOUNTER — Encounter: Payer: Self-pay | Admitting: Urology

## 2024-01-10 VITALS — BP 115/73 | HR 87 | Ht 69.0 in | Wt 203.2 lb

## 2024-01-10 DIAGNOSIS — N529 Male erectile dysfunction, unspecified: Secondary | ICD-10-CM | POA: Diagnosis not present

## 2024-01-10 DIAGNOSIS — N138 Other obstructive and reflux uropathy: Secondary | ICD-10-CM | POA: Diagnosis not present

## 2024-01-10 DIAGNOSIS — N401 Enlarged prostate with lower urinary tract symptoms: Secondary | ICD-10-CM | POA: Diagnosis not present

## 2024-01-10 DIAGNOSIS — E291 Testicular hypofunction: Secondary | ICD-10-CM | POA: Diagnosis not present

## 2024-01-10 NOTE — Patient Instructions (Signed)
 Please come to the [facility] lab in mid June to have your hemoglobin/hematocrit and testosterone level drawn

## 2024-01-12 LAB — TESTOSTERONE,FREE AND TOTAL
Testosterone, Free: 26.1 pg/mL — ABNORMAL HIGH (ref 6.6–18.1)
Testosterone: >1500 ng/dL — ABNORMAL HIGH (ref 264–916)

## 2024-01-12 IMAGING — CR DG SHOULDER 2+V*R*
3 series · 3 of 3 positions shown · non-contrast
Comparison: None

CLINICAL DATA: Pain over the last month, worsening today.

EXAM:
RIGHT SHOULDER - 2+ VIEW

[shoulder grashey]
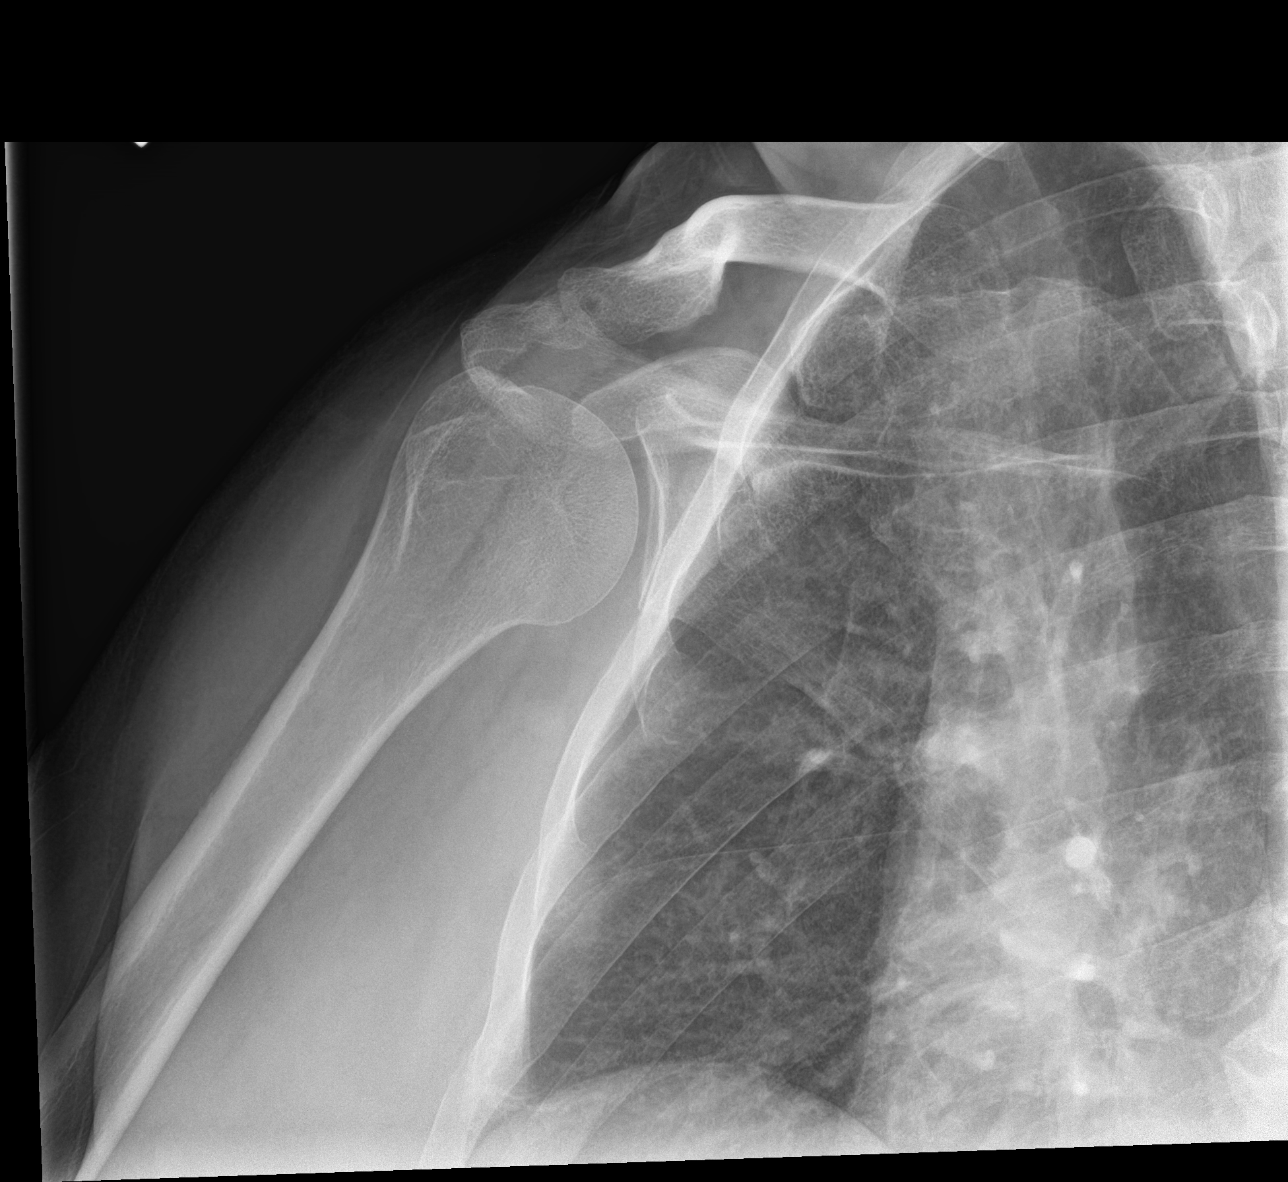

[shoulder y view]
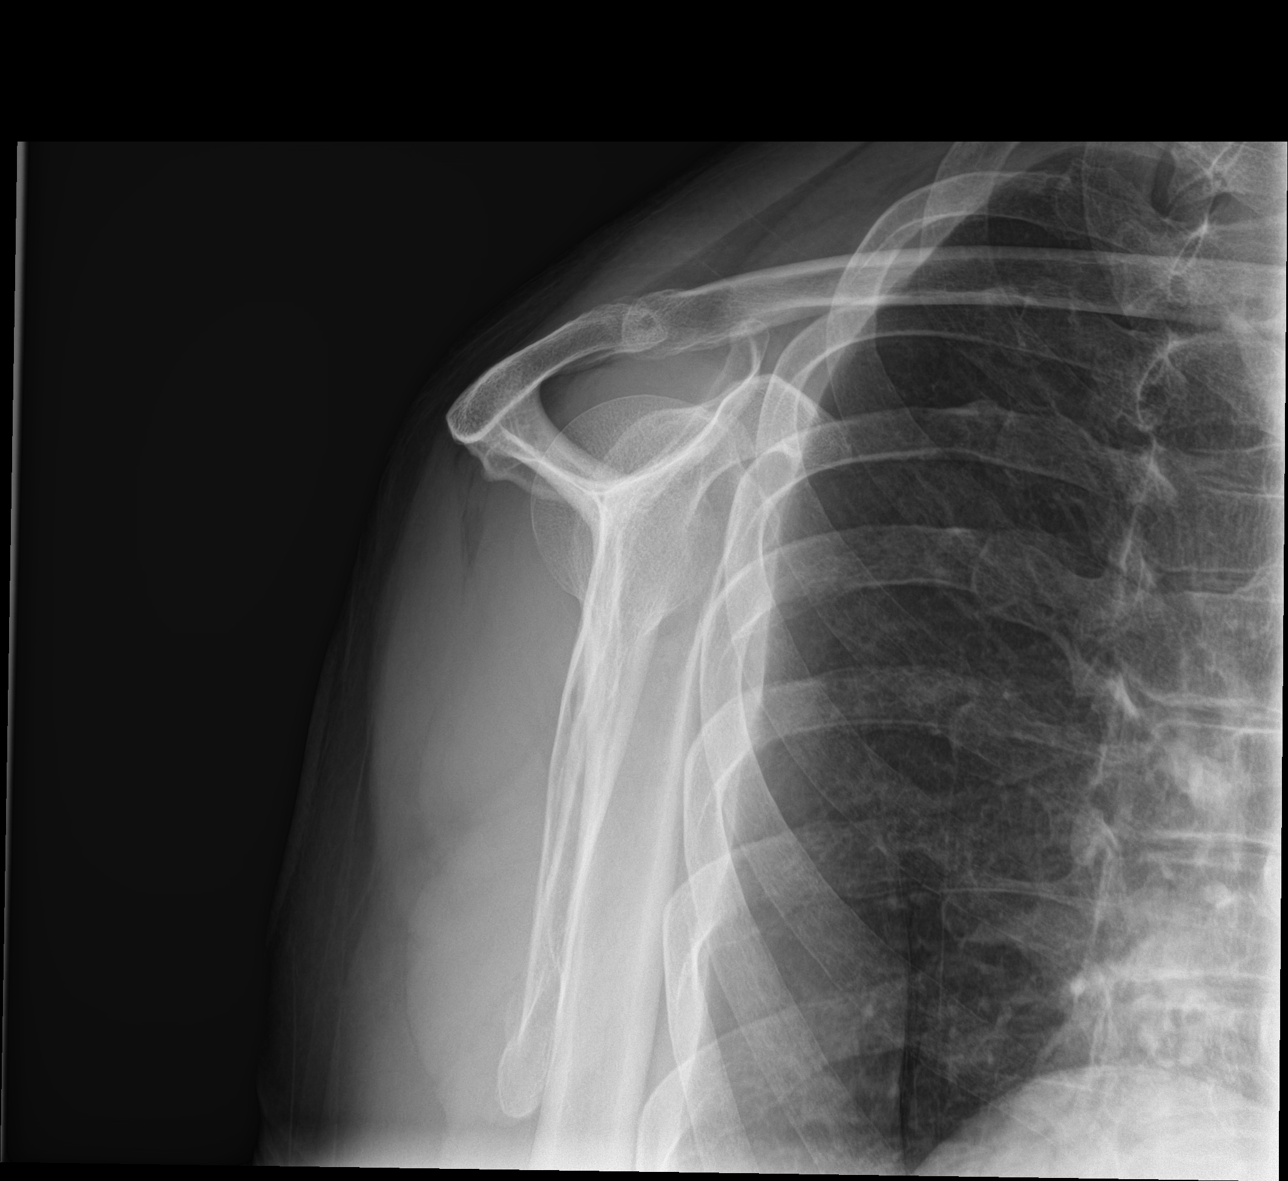

[shoulder axial]
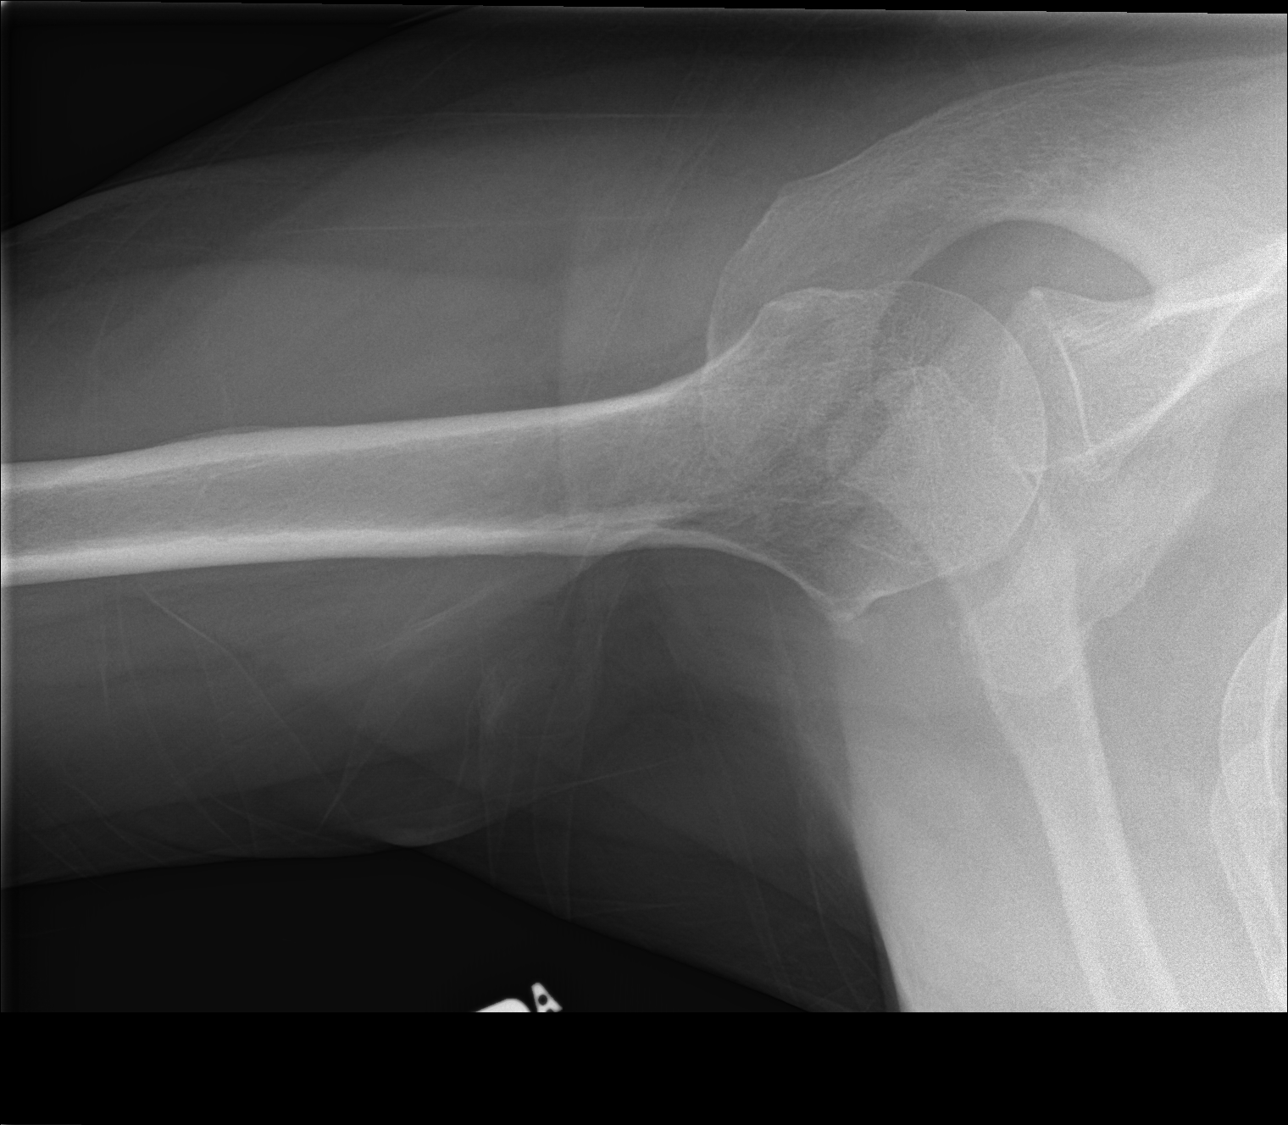

[3 of 3 positions shown; findings below may reference images not displayed]

FINDINGS: Glenohumeral joint is normal. Normal humeral acromial distance. AC
joint is unremarkable. Other regional bony structures appear normal.
IMPRESSION: Negative shoulder radiography.

## 2024-01-13 ENCOUNTER — Other Ambulatory Visit: Payer: Self-pay

## 2024-01-13 DIAGNOSIS — E349 Endocrine disorder, unspecified: Secondary | ICD-10-CM

## 2024-01-13 DIAGNOSIS — E291 Testicular hypofunction: Secondary | ICD-10-CM

## 2024-01-13 DIAGNOSIS — N529 Male erectile dysfunction, unspecified: Secondary | ICD-10-CM

## 2024-04-10 ENCOUNTER — Other Ambulatory Visit
Admission: RE | Admit: 2024-04-10 | Discharge: 2024-04-10 | Disposition: A | Discharge status: Home / Self Care | Attending: Urology | Admitting: Urology

## 2024-04-10 DIAGNOSIS — E291 Testicular hypofunction: Secondary | ICD-10-CM | POA: Insufficient documentation

## 2024-04-10 DIAGNOSIS — N529 Male erectile dysfunction, unspecified: Secondary | ICD-10-CM | POA: Insufficient documentation

## 2024-04-10 DIAGNOSIS — E349 Endocrine disorder, unspecified: Secondary | ICD-10-CM | POA: Diagnosis present

## 2024-04-10 LAB — HEMOGLOBIN AND HEMATOCRIT, BLOOD
HCT: 44.4 % (ref 39.0–52.0)
Hemoglobin: 15.1 g/dL (ref 13.0–17.0)

## 2024-04-12 LAB — TESTOSTERONE,FREE AND TOTAL
Testosterone, Free: 3 pg/mL — ABNORMAL LOW (ref 6.6–18.1)
Testosterone: 397 ng/dL (ref 264–916)

## 2024-04-16 ENCOUNTER — Ambulatory Visit: Payer: Self-pay | Admitting: Urology

## 2024-04-17 ENCOUNTER — Telehealth: Payer: Self-pay | Admitting: Urology

## 2024-04-17 NOTE — Telephone Encounter (Signed)
 Pt wants to know if he should take test injection every 2 weeks or once per month, because of his free test level.  You can send him a message through MyChart.

## 2024-05-03 ENCOUNTER — Other Ambulatory Visit: Payer: Self-pay

## 2024-05-03 DIAGNOSIS — E349 Endocrine disorder, unspecified: Secondary | ICD-10-CM

## 2024-05-04 MED ORDER — TESTOSTERONE CYPIONATE 200 MG/ML IM SOLN: INTRAMUSCULAR | 0 refills | Status: DC

## 2024-06-27 ENCOUNTER — Encounter: Payer: Self-pay | Admitting: Urology

## 2024-07-17 ENCOUNTER — Other Ambulatory Visit
Admission: RE | Admit: 2024-07-17 | Discharge: 2024-07-17 | Disposition: A | Discharge status: Home / Self Care | Attending: Urology | Admitting: Urology

## 2024-07-17 ENCOUNTER — Ambulatory Visit: Admitting: Urology

## 2024-07-17 DIAGNOSIS — E291 Testicular hypofunction: Secondary | ICD-10-CM | POA: Diagnosis present

## 2024-07-17 LAB — HEMOGLOBIN AND HEMATOCRIT, BLOOD
HCT: 50.8 % (ref 39.0–52.0)
Hemoglobin: 17.3 g/dL — ABNORMAL HIGH (ref 13.0–17.0)

## 2024-07-18 LAB — TESTOSTERONE: Testosterone: 811 ng/dL (ref 264–916)

## 2024-07-19 ENCOUNTER — Ambulatory Visit: Payer: Self-pay | Admitting: Urology

## 2024-07-28 NOTE — Progress Notes (Signed)
 07/31/24 9:00 AM   Shawn Atkins 1956-02-08 [id number]  Referring provider:  Derick Leita POUR, MD [address] [place],  [place] [address]  Urological history: 1. BPH with LU TS -PSA (09/2023) 0.29  -tadalafil  5 mg daily  2. Nocturia -Risk factors for nocturia: hypertension and BPH.     3. ED -contributing factors of age, BPH, testosterone  deficiency and DM -tadalafil  5 mg daily    4. High risk hematuria -Former smoker -CTU 2014  left kidney demonstrates focal wedge-shaped indentation with near complete cortical loss within the lateral aspect of the mid pole of the kidney. A focal cyst is identified in this  area demonstrating Hounsfield units of 1 and 7 and measures approximately 1 cm -no cystoscopy for unknown reasons   5. Nephrolithiasis -contrast CT in 2016 notes 2 mm right mid kidney nonobstructive calculus. 1-2 mm right kidney lower pole nonobstructive calculus. Potential 1 mm left kidney lower pole nonobstructive calculus -no stones seen on contrast CT in 2019  6. Prostate cancer screening -AUA guidelines (2023) recommend screening for prostate cancer in men ages 59 to 72 every 2 to 4 years -no family history of prostate cancer, breast cancer or ovarian cancer  -he will have yearly screening secondary to TRT  7. Hypogonadism -testosterone  level (06/2024) 811 -hemoglobin/hematocrit (06/2024) 17.3/50.8  -Testosterone  cypionate 200/milliliters, 0.5 cc every 14 days  Chief Complaint  Patient presents with   Hypogonadism    HPI: Shawn Atkins is a 67 y.o. man who presents today follow up.     Previous records reviewed  He reports good adherence to testosterone  cypionate 200 mg/milliliters, 0.5 cc every 14 days..  Denies new complaints of low libido, erectile dysfunction, fatigue, or mood changes.  No complaints of gynecomastia, visual changes, or thromboembolic symptoms.  Energy level, libido and overall sense of wellbeing being reported as stable/ improved  compared to prior visit.  Contributing factors to his hypogonadism are age, diabetes and obesity  Testosterone  level 811  Hemoglobin/hematocrit 17.3/50.8  I PSS 5/1  He states he is voiding with a strong urinary stream, no episodes of nocturia, he feels that he is empties his bladder completely and he has no incontinence.  Patient denies any modifying or aggravating factors.  Patient denies any recent UTI's, gross hematuria, dysuria or suprapubic/flank pain.  Patient denies any fevers, chills, nausea or vomiting.    PSA (09/2023) 0.29  He is taking tadalafil  5 mg daily.    SHIM 19  He is finding tadalafil  5 mg daily is very helpful with achieving erections and maintaining erections for satisfactory intercourse.  He is having spontaneous penile fullness.  He denies any pain or curvature with erections.  He has a mild burning with ejaculation on a rare occasion.  Testosterone  level 811   He is taking tadalafil  5 mg daily.  PMH: Past Medical History:  Diagnosis Date   BPH (benign prostatic hypertrophy)    Bronchitis    Chronic prostatitis    Complication of anesthesia    Felt like couldn't breathe after triple Hernia surgery   Diabetes mellitus without complication (HCC)    Flank pain    GERD (gastroesophageal reflux disease)    h/o   Gross hematuria    History of kidney stones    HTN (hypertension)    pt states he takes lisinopril for kidney protection due to dm not htn   Inguinal hernia    Nocturia    OSA on CPAP    with 02  2 L   Overweight    PONV (postoperative nausea and vomiting)    only during kidney stone surgery   Spermatocele    Stricture of urethra    Vertigo    1 episode, 6-7 yrs ago    Surgical History: Past Surgical History:  Procedure Laterality Date   BICEPT TENODESIS Right 08/06/2022   Procedure: BICEPS TENODESIS;  Surgeon: Marchia Drivers, MD;  Location: [facility];  Service: Orthopedics;  Laterality: Right;   CATARACT EXTRACTION W/PHACO Left  07/22/2020   Procedure: CATARACT EXTRACTION PHACO AND INTRAOCULAR LENS PLACEMENT (IOC) LEFT DIABETIC 4.14  00:33.0;  Surgeon: Myrna Adine Anes, MD;  Location: [facility];  Service: Ophthalmology;  Laterality: Left;  Diabetic - oral meds   CATARACT EXTRACTION W/PHACO Right 08/12/2020   Procedure: CATARACT EXTRACTION PHACO AND INTRAOCULAR LENS PLACEMENT (IOC) RIGHT DIABETIC;  Surgeon: Myrna Adine Anes, MD;  Location: [facility];  Service: Ophthalmology;  Laterality: Right;  1.99 0:26.2   COLONOSCOPY WITH PROPOFOL  N/A 11/20/2015   Procedure: COLONOSCOPY WITH PROPOFOL ;  Surgeon: Louanne KANDICE Muse, MD;  Location: [facility];  Service: Endoscopy;  Laterality: N/A;   HERNIA REPAIR Bilateral 2014   umbilical and bil inguinal/ Dr Eluterio   KIDNEY STONE SURGERY     KNEE ARTHROSCOPY Left    open lithotomy     SHOULDER ARTHROSCOPY WITH OPEN ROTATOR CUFF REPAIR AND DISTAL CLAVICLE ACROMINECTOMY Right 08/06/2022   Procedure: SHOULDER ARTHROSCOPY WITH OPEN ROTATOR CUFF REPAIR AND DISTAL CLAVICLE ACROMINECTOMY;  Surgeon: Marchia Drivers, MD;  Location: [facility];  Service: Orthopedics;  Laterality: Right;    Home Medications:  Allergies as of 07/31/2024       Reactions   Penicillins Anaphylaxis   Morphine And Codeine Nausea And Vomiting        Medication List        Accurate as of July 31, 2024  9:00 AM. If you have any questions, ask your nurse or doctor.          2-3CC SYRINGE 3 ML Misc 1 mg by Does not apply route every 14 (fourteen) days.   2-3CC SYRINGE 3 ML Misc 1 mg by Does not apply route every 14 (fourteen) days.   albuterol (2.5 MG/3ML) 0.083% nebulizer solution Commonly known as: PROVENTIL Take 2.5 mg by nebulization every 6 (six) hours as needed for wheezing or shortness of breath (Last used in the winter of 2022).   atorvastatin 20 MG tablet Commonly known as: LIPITOR Take 20 mg by mouth every evening.   BD Disp Needles 18G X 1-1/2  Misc Generic drug: NEEDLE (DISP) 18 G 1 mg by Does not apply route every 14 (fourteen) days.   BD Disp Needles 21G X 1-1/2 Misc Generic drug: NEEDLE (DISP) 21 G Use every 14 days   BD Disp Needles 21G X 1-1/2 Misc Generic drug: NEEDLE (DISP) 21 G 1 mg by Does not apply route every 14 (fourteen) days.   BIOTIN PO Take 1 tablet by mouth daily.   glucosamine-chondroitin 500-400 MG tablet Take 1 tablet by mouth 2 (two) times daily.   lisinopril 10 MG tablet Commonly known as: ZESTRIL Take 10 mg by mouth every morning.   metFORMIN 500 MG 24 hr tablet Commonly known as: GLUCOPHAGE-XR Take 1,000 mg by mouth 2 (two) times daily with a meal.   multivitamin tablet Take 1 tablet by mouth daily.   ONE TOUCH ULTRA 2 w/Device Kit SMARTSIG:Via Meter   OneTouch Delica Plus Lancet33G Misc Apply 1 each  topically daily.   OneTouch Ultra Test test strip Generic drug: glucose blood USE 1 STRIP TO CHECK GLUCOSE ONCE DAILY   Ozempic (0.25 or 0.5 MG/DOSE) 2 MG/3ML Sopn Generic drug: Semaglutide(0.25 or 0.5MG /DOS) Inject 0.5 mg into the skin once a week.   pioglitazone 15 MG tablet Commonly known as: ACTOS Take 15 mg by mouth daily.   tadalafil  5 MG tablet Commonly known as: CIALIS  TAKE 1 TABLET BY MOUTH EVERY MORNING   testosterone  cypionate 200 MG/ML injection Commonly known as: DEPOTESTOSTERONE CYPIONATE Inject 0.75 cc every 14 days        Allergies:  Allergies  Allergen Reactions   Penicillins Anaphylaxis   Morphine And Codeine Nausea And Vomiting    Family History: Family History  Problem Relation Age of Onset   Benign prostatic hyperplasia Father    Kidney disease Neg Hx    Prostate cancer Neg Hx     Social History:  reports that he quit smoking about 40 years ago. His smoking use included cigarettes. He started smoking about 55 years ago. He has a 15 pack-year smoking history. He has been exposed to tobacco smoke. His smokeless tobacco use includes snuff. He  reports current alcohol use of about 1.0 standard drink of alcohol per week. He reports that he does not use drugs.   Physical Exam: BP 133/82 (BP Location: Left Arm, Patient Position: Sitting, Cuff Size: Large)   Pulse 89   Wt 203 lb (92.1 kg)   SpO2 97%   BMI 29.98 kg/m   Constitutional:  Well nourished. Alert and oriented, No acute distress. HEENT:  AT, moist mucus membranes.  Trachea midline Cardiovascular: No clubbing, cyanosis, or edema. Respiratory: Normal respiratory effort, no increased work of breathing. Neurologic: Grossly intact, no focal deficits, moving all 4 extremities. Psychiatric: Normal mood and affect.   Laboratory Data: See EPIC and HPI  I have reviewed the labs.   Pertinent Imaging N/A  Assessment & Plan:    1.  Hypogonadism -testosterone  levels are therapeutic -H & H elevated  - Continue testosterone  cypionate 200 mg/milliliters, 0.75 every 14 days  2.  BPH with LU TS - stable, mild symptoms  - no signs of retention, infection or malignancy  - PSA up to date  - encouraged avoiding bladder irritants, fluid restriction before bedtime and timed voiding's - Continue Cialis  5 mg daily; refills given - educated on red flag symptoms: acute retention, gross hematuria, fever, severe pain - advised to call clinic or go to the ED if these occur   3. ED - Having satisfactory intercourse with tadalafil  5 mg daily; Refills given  Return in about 3 months (around 10/31/2024) for PSA, testosterone  1 week after injection, hemoglobin, and hematocrit only.  Shawn Atkins   [facility] [address] [place], [place] [address] [phone]

## 2024-07-31 ENCOUNTER — Encounter: Payer: Self-pay | Admitting: Urology

## 2024-07-31 ENCOUNTER — Ambulatory Visit: Admitting: Urology

## 2024-07-31 VITALS — BP 133/82 | HR 89 | Wt 203.0 lb

## 2024-07-31 DIAGNOSIS — N401 Enlarged prostate with lower urinary tract symptoms: Secondary | ICD-10-CM | POA: Diagnosis not present

## 2024-07-31 DIAGNOSIS — E349 Endocrine disorder, unspecified: Secondary | ICD-10-CM

## 2024-07-31 DIAGNOSIS — E291 Testicular hypofunction: Secondary | ICD-10-CM | POA: Diagnosis not present

## 2024-07-31 DIAGNOSIS — N529 Male erectile dysfunction, unspecified: Secondary | ICD-10-CM | POA: Diagnosis not present

## 2024-07-31 DIAGNOSIS — N138 Other obstructive and reflux uropathy: Secondary | ICD-10-CM

## 2024-07-31 MED ORDER — BD DISP NEEDLES 18G X 1-1/2" MISC
1.0000 mg | 0 refills | Status: AC
Start: 2024-07-31 — End: ?

## 2024-07-31 MED ORDER — BD DISP NEEDLES 21G X 1-1/2" MISC
1.0000 mg | 0 refills | Status: AC
Start: 2024-07-31 — End: ?

## 2024-07-31 MED ORDER — TESTOSTERONE CYPIONATE 200 MG/ML IM SOLN: INTRAMUSCULAR | 0 refills | Status: AC

## 2024-07-31 MED ORDER — SYRINGE 2-3 ML 3 ML MISC
1.0000 mg | 3 refills | Status: AC
Start: 2024-07-31 — End: ?

## 2024-08-21 ENCOUNTER — Other Ambulatory Visit: Payer: Self-pay

## 2024-08-21 ENCOUNTER — Telehealth: Payer: Self-pay | Admitting: Urology

## 2024-08-21 DIAGNOSIS — E291 Testicular hypofunction: Secondary | ICD-10-CM

## 2024-08-21 NOTE — Telephone Encounter (Signed)
 Would you let Shawn Atkins know that I saw his wife today and she brought up her concerns with his face turning red.  I would like him to get his testosterone , hemoglobin and hematocrit check some time this week to make sure that they not elevated.

## 2024-08-25 ENCOUNTER — Other Ambulatory Visit
Admission: RE | Admit: 2024-08-25 | Discharge: 2024-08-25 | Disposition: A | Discharge status: Home / Self Care | Attending: Urology | Admitting: Urology

## 2024-08-25 DIAGNOSIS — E291 Testicular hypofunction: Secondary | ICD-10-CM | POA: Diagnosis present

## 2024-08-25 LAB — HEMOGLOBIN AND HEMATOCRIT, BLOOD
HCT: 49.8 % (ref 39.0–52.0)
Hemoglobin: 17.3 g/dL — ABNORMAL HIGH (ref 13.0–17.0)

## 2024-08-26 ENCOUNTER — Ambulatory Visit: Payer: Self-pay | Admitting: Urology

## 2024-08-26 LAB — TESTOSTERONE: Testosterone: 952 ng/dL — ABNORMAL HIGH (ref 264–916)

## 2024-11-25 ENCOUNTER — Telehealth: Payer: Self-pay | Admitting: Urology

## 2024-11-25 NOTE — Telephone Encounter (Signed)
 We have not repeated his blood work since we have reduced his testosterone  dose.  Will you call him and ask him if would prefer to go to the [facility] lab or come to [facility]?

## 2024-11-30 ENCOUNTER — Other Ambulatory Visit: Payer: Self-pay

## 2024-11-30 DIAGNOSIS — E291 Testicular hypofunction: Secondary | ICD-10-CM

## 2024-11-30 NOTE — Telephone Encounter (Signed)
 Spoke with patient in regards to his lab results, Advised that his testosterone  labs look good and per CANDIE Cornwall PA keep current dose and we will see him back in the office in 3 months with labs prior. Patient verbalized understanding of information given.  Andrea Kirks LPN

## 2025-02-27 ENCOUNTER — Ambulatory Visit: Admitting: Urology
